# Patient Record
Sex: Female | Born: 1935
Health system: Southern US, Community
[De-identification: ages and names within clinical notes are randomized; demographics above are authoritative.]

## PROBLEM LIST (undated history)

## (undated) DIAGNOSIS — M25559 Pain in unspecified hip: Secondary | ICD-10-CM

## (undated) DIAGNOSIS — R7302 Impaired glucose tolerance (oral): Secondary | ICD-10-CM

## (undated) DIAGNOSIS — D492 Neoplasm of unspecified behavior of bone, soft tissue, and skin: Secondary | ICD-10-CM

## (undated) DIAGNOSIS — M810 Age-related osteoporosis without current pathological fracture: Secondary | ICD-10-CM

## (undated) DIAGNOSIS — N6009 Solitary cyst of unspecified breast: Secondary | ICD-10-CM

## (undated) DIAGNOSIS — M1990 Unspecified osteoarthritis, unspecified site: Secondary | ICD-10-CM

## (undated) DIAGNOSIS — I10 Essential (primary) hypertension: Secondary | ICD-10-CM

## (undated) DIAGNOSIS — I499 Cardiac arrhythmia, unspecified: Secondary | ICD-10-CM

## (undated) DIAGNOSIS — I2699 Other pulmonary embolism without acute cor pulmonale: Secondary | ICD-10-CM

## (undated) DIAGNOSIS — E785 Hyperlipidemia, unspecified: Secondary | ICD-10-CM

## (undated) DIAGNOSIS — R600 Localized edema: Secondary | ICD-10-CM

## (undated) DIAGNOSIS — Z9189 Other specified personal risk factors, not elsewhere classified: Secondary | ICD-10-CM

## (undated) HISTORY — DX: Neoplasm of unspecified behavior of bone, soft tissue, and skin: D49.2

## (undated) HISTORY — PX: ABDOMINAL HYSTERECTOMY: SHX81

## (undated) HISTORY — DX: Pain in unspecified hip: M25.559

## (undated) HISTORY — DX: Solitary cyst of unspecified breast: N60.09

## (undated) HISTORY — DX: Hyperlipidemia, unspecified: E78.5

## (undated) HISTORY — DX: Essential (primary) hypertension: I10

## (undated) HISTORY — PX: APPENDECTOMY: SHX54

## (undated) HISTORY — DX: Age-related osteoporosis without current pathological fracture: M81.0

## (undated) HISTORY — DX: Impaired glucose tolerance (oral): R73.02

## (undated) HISTORY — DX: Other pulmonary embolism without acute cor pulmonale: I26.99

## (undated) HISTORY — DX: Other specified personal risk factors, not elsewhere classified: Z91.89

## (undated) HISTORY — PX: BREAST SURGERY: SHX581

---

## 1978-01-26 HISTORY — PX: CHOLECYSTECTOMY: SHX55

## 1998-03-14 ENCOUNTER — Encounter: Payer: Self-pay | Admitting: Emergency Medicine

## 1998-03-14 ENCOUNTER — Emergency Department (HOSPITAL_COMMUNITY): Admission: EM | Admit: 1998-03-14 | Discharge: 1998-03-14 | Payer: Self-pay | Admitting: Emergency Medicine

## 1999-10-24 ENCOUNTER — Encounter: Payer: Self-pay | Admitting: Internal Medicine

## 1999-10-24 ENCOUNTER — Encounter: Admission: RE | Admit: 1999-10-24 | Discharge: 1999-10-24 | Payer: Self-pay | Admitting: Internal Medicine

## 2000-11-01 ENCOUNTER — Encounter: Admission: RE | Admit: 2000-11-01 | Discharge: 2000-11-01 | Payer: Self-pay | Admitting: Internal Medicine

## 2000-11-01 ENCOUNTER — Encounter: Payer: Self-pay | Admitting: Internal Medicine

## 2000-11-04 ENCOUNTER — Encounter: Admission: RE | Admit: 2000-11-04 | Discharge: 2000-11-04 | Payer: Self-pay | Admitting: Internal Medicine

## 2000-11-04 ENCOUNTER — Encounter: Payer: Self-pay | Admitting: Internal Medicine

## 2001-11-08 ENCOUNTER — Encounter: Admission: RE | Admit: 2001-11-08 | Discharge: 2001-11-08 | Payer: Self-pay | Admitting: Internal Medicine

## 2001-11-08 ENCOUNTER — Encounter: Payer: Self-pay | Admitting: Internal Medicine

## 2002-11-17 ENCOUNTER — Encounter: Admission: RE | Admit: 2002-11-17 | Discharge: 2002-11-17 | Payer: Self-pay | Admitting: Internal Medicine

## 2002-11-17 ENCOUNTER — Encounter: Payer: Self-pay | Admitting: Internal Medicine

## 2003-12-03 ENCOUNTER — Encounter: Admission: RE | Admit: 2003-12-03 | Discharge: 2003-12-03 | Payer: Self-pay | Admitting: Internal Medicine

## 2004-01-13 ENCOUNTER — Encounter: Payer: Self-pay | Admitting: Internal Medicine

## 2004-02-05 ENCOUNTER — Ambulatory Visit: Payer: Self-pay | Admitting: Internal Medicine

## 2004-04-21 ENCOUNTER — Ambulatory Visit: Payer: Self-pay | Admitting: Internal Medicine

## 2004-07-09 ENCOUNTER — Ambulatory Visit: Payer: Self-pay | Admitting: Internal Medicine

## 2004-08-04 ENCOUNTER — Ambulatory Visit: Payer: Self-pay | Admitting: Internal Medicine

## 2004-08-29 ENCOUNTER — Ambulatory Visit: Payer: Self-pay | Admitting: Family Medicine

## 2004-11-04 ENCOUNTER — Ambulatory Visit: Payer: Self-pay | Admitting: Internal Medicine

## 2004-11-14 ENCOUNTER — Ambulatory Visit: Payer: Self-pay

## 2004-12-22 ENCOUNTER — Encounter: Admission: RE | Admit: 2004-12-22 | Discharge: 2004-12-22 | Payer: Self-pay | Admitting: Internal Medicine

## 2005-02-04 ENCOUNTER — Ambulatory Visit: Payer: Self-pay | Admitting: Internal Medicine

## 2005-05-22 ENCOUNTER — Ambulatory Visit: Payer: Self-pay | Admitting: Family Medicine

## 2005-08-04 ENCOUNTER — Ambulatory Visit: Payer: Self-pay | Admitting: Internal Medicine

## 2005-12-02 ENCOUNTER — Ambulatory Visit: Payer: Self-pay | Admitting: Internal Medicine

## 2005-12-29 ENCOUNTER — Encounter: Admission: RE | Admit: 2005-12-29 | Discharge: 2005-12-29 | Payer: Self-pay | Admitting: Internal Medicine

## 2006-06-02 ENCOUNTER — Ambulatory Visit: Payer: Self-pay | Admitting: Internal Medicine

## 2006-06-10 ENCOUNTER — Encounter: Payer: Self-pay | Admitting: Internal Medicine

## 2006-06-10 DIAGNOSIS — Z9189 Other specified personal risk factors, not elsewhere classified: Secondary | ICD-10-CM | POA: Insufficient documentation

## 2006-06-10 DIAGNOSIS — I10 Essential (primary) hypertension: Secondary | ICD-10-CM

## 2006-06-10 DIAGNOSIS — M810 Age-related osteoporosis without current pathological fracture: Secondary | ICD-10-CM | POA: Insufficient documentation

## 2006-06-10 DIAGNOSIS — E785 Hyperlipidemia, unspecified: Secondary | ICD-10-CM

## 2006-06-10 HISTORY — DX: Essential (primary) hypertension: I10

## 2006-06-10 HISTORY — DX: Age-related osteoporosis without current pathological fracture: M81.0

## 2006-06-10 HISTORY — DX: Other specified personal risk factors, not elsewhere classified: Z91.89

## 2006-06-10 HISTORY — DX: Hyperlipidemia, unspecified: E78.5

## 2006-08-20 ENCOUNTER — Telehealth: Payer: Self-pay | Admitting: Internal Medicine

## 2006-09-06 ENCOUNTER — Ambulatory Visit: Payer: Self-pay | Admitting: Internal Medicine

## 2006-09-06 DIAGNOSIS — R300 Dysuria: Secondary | ICD-10-CM

## 2006-09-06 LAB — CONVERTED CEMR LAB
Bilirubin Urine: NEGATIVE
Glucose, Urine, Semiquant: NEGATIVE
Ketones, urine, test strip: NEGATIVE
Nitrite: POSITIVE
Protein, U semiquant: 30
Specific Gravity, Urine: 1.025
pH: 5

## 2006-09-09 ENCOUNTER — Telehealth: Payer: Self-pay | Admitting: Internal Medicine

## 2007-01-04 ENCOUNTER — Encounter: Admission: RE | Admit: 2007-01-04 | Discharge: 2007-01-04 | Payer: Self-pay | Admitting: Internal Medicine

## 2007-01-19 ENCOUNTER — Ambulatory Visit: Payer: Self-pay | Admitting: Internal Medicine

## 2007-01-19 LAB — CONVERTED CEMR LAB
ALT: 17 units/L (ref 0–35)
AST: 27 units/L (ref 0–37)
Albumin: 3.9 g/dL (ref 3.5–5.2)
Alkaline Phosphatase: 43 units/L (ref 39–117)
BUN: 18 mg/dL (ref 6–23)
Basophils Absolute: 0 10*3/uL (ref 0.0–0.1)
Basophils Relative: 0.1 % (ref 0.0–1.0)
Bilirubin, Direct: 0.3 mg/dL (ref 0.0–0.3)
CO2: 30 meq/L (ref 19–32)
Calcium: 9.9 mg/dL (ref 8.4–10.5)
Chloride: 106 meq/L (ref 96–112)
Cholesterol: 146 mg/dL (ref 0–200)
Creatinine, Ser: 1 mg/dL (ref 0.4–1.2)
Eosinophils Absolute: 0.2 10*3/uL (ref 0.0–0.6)
Eosinophils Relative: 1.9 % (ref 0.0–5.0)
GFR calc Af Amer: 70 mL/min
GFR calc non Af Amer: 58 mL/min
Glucose, Bld: 109 mg/dL — ABNORMAL HIGH (ref 70–99)
HCT: 37.8 % (ref 36.0–46.0)
HDL: 63.7 mg/dL (ref >39.0)
Hemoglobin: 13.1 g/dL (ref 12.0–15.0)
LDL Cholesterol: 68 mg/dL (ref 0–99)
Lymphocytes Relative: 28.3 % (ref 12.0–46.0)
MCHC: 34.6 g/dL (ref 30.0–36.0)
MCV: 91.8 fL (ref 78.0–100.0)
Monocytes Absolute: 0.7 10*3/uL (ref 0.2–0.7)
Monocytes Relative: 8.2 % (ref 3.0–11.0)
Neutro Abs: 5.3 10*3/uL (ref 1.4–7.7)
Neutrophils Relative %: 61.5 % (ref 43.0–77.0)
Platelets: 295 10*3/uL (ref 150–400)
Potassium: 4.6 meq/L (ref 3.5–5.1)
RBC: 4.12 M/uL (ref 3.87–5.11)
RDW: 13.5 % (ref 11.5–14.6)
Sodium: 144 meq/L (ref 135–145)
TSH: 2.9 microintl units/mL (ref 0.35–5.50)
Total Bilirubin: 1 mg/dL (ref 0.3–1.2)
Total CHOL/HDL Ratio: 2.3
Total Protein: 6.3 g/dL (ref 6.0–8.3)
Triglycerides: 72 mg/dL (ref 0–149)
VLDL: 14 mg/dL (ref 0–40)
WBC: 8.6 10*3/uL (ref 4.5–10.5)

## 2007-01-31 ENCOUNTER — Ambulatory Visit: Payer: Self-pay | Admitting: Internal Medicine

## 2007-01-31 DIAGNOSIS — M25559 Pain in unspecified hip: Secondary | ICD-10-CM

## 2007-01-31 HISTORY — DX: Pain in unspecified hip: M25.559

## 2007-05-27 ENCOUNTER — Ambulatory Visit: Payer: Self-pay | Admitting: Internal Medicine

## 2007-05-27 DIAGNOSIS — N390 Urinary tract infection, site not specified: Secondary | ICD-10-CM

## 2007-05-27 LAB — CONVERTED CEMR LAB
Bilirubin Urine: NEGATIVE
Glucose, Urine, Semiquant: NEGATIVE
Ketones, urine, test strip: NEGATIVE
Nitrite: POSITIVE
Protein, U semiquant: 30
Specific Gravity, Urine: >=1.03
pH: 5

## 2007-05-30 ENCOUNTER — Telehealth: Payer: Self-pay | Admitting: Internal Medicine

## 2007-09-07 ENCOUNTER — Ambulatory Visit: Payer: Self-pay | Admitting: Family Medicine

## 2007-09-07 LAB — CONVERTED CEMR LAB
Bilirubin Urine: NEGATIVE
Glucose, Urine, Semiquant: NEGATIVE
Ketones, urine, test strip: NEGATIVE
Nitrite: NEGATIVE
Protein, U semiquant: 30
Specific Gravity, Urine: 1.015
Urobilinogen, UA: 0.2
pH: 8.5

## 2007-09-27 ENCOUNTER — Ambulatory Visit: Payer: Self-pay | Admitting: Internal Medicine

## 2008-01-06 ENCOUNTER — Encounter: Admission: RE | Admit: 2008-01-06 | Discharge: 2008-01-06 | Payer: Self-pay | Admitting: Internal Medicine

## 2008-01-23 ENCOUNTER — Encounter: Admission: RE | Admit: 2008-01-23 | Discharge: 2008-01-23 | Payer: Self-pay | Admitting: Internal Medicine

## 2008-01-30 ENCOUNTER — Ambulatory Visit: Payer: Self-pay | Admitting: Internal Medicine

## 2008-05-31 ENCOUNTER — Ambulatory Visit: Payer: Self-pay | Admitting: Internal Medicine

## 2008-05-31 DIAGNOSIS — D492 Neoplasm of unspecified behavior of bone, soft tissue, and skin: Secondary | ICD-10-CM | POA: Insufficient documentation

## 2008-05-31 HISTORY — DX: Neoplasm of unspecified behavior of bone, soft tissue, and skin: D49.2

## 2008-05-31 LAB — CONVERTED CEMR LAB
ALT: 13 units/L (ref 0–35)
AST: 23 units/L (ref 0–37)
Albumin: 3.9 g/dL (ref 3.5–5.2)
Alkaline Phosphatase: 44 units/L (ref 39–117)
BUN: 26 mg/dL — ABNORMAL HIGH (ref 6–23)
Basophils Absolute: 0 10*3/uL (ref 0.0–0.1)
Basophils Relative: 0.4 % (ref 0.0–3.0)
Bilirubin, Direct: 0.2 mg/dL (ref 0.0–0.3)
CO2: 30 meq/L (ref 19–32)
Calcium: 9.8 mg/dL (ref 8.4–10.5)
Chloride: 109 meq/L (ref 96–112)
Cholesterol: 134 mg/dL (ref 0–200)
Creatinine, Ser: 0.9 mg/dL (ref 0.4–1.2)
Eosinophils Absolute: 0.1 10*3/uL (ref 0.0–0.7)
Eosinophils Relative: 1.5 % (ref 0.0–5.0)
GFR calc non Af Amer: 64.91 mL/min (ref >60)
Glucose, Bld: 115 mg/dL — ABNORMAL HIGH (ref 70–99)
HCT: 36.7 % (ref 36.0–46.0)
HDL: 60.6 mg/dL (ref >39.00)
Hemoglobin: 12.5 g/dL (ref 12.0–15.0)
LDL Cholesterol: 59 mg/dL (ref 0–99)
Lymphocytes Relative: 30.2 % (ref 12.0–46.0)
Lymphs Abs: 2.1 10*3/uL (ref 0.7–4.0)
MCHC: 33.9 g/dL (ref 30.0–36.0)
MCV: 92.6 fL (ref 78.0–100.0)
Monocytes Absolute: 0.5 10*3/uL (ref 0.1–1.0)
Monocytes Relative: 7.1 % (ref 3.0–12.0)
Neutro Abs: 4.3 10*3/uL (ref 1.4–7.7)
Neutrophils Relative %: 60.8 % (ref 43.0–77.0)
Platelets: 262 10*3/uL (ref 150.0–400.0)
Potassium: 4.3 meq/L (ref 3.5–5.1)
RBC: 3.96 M/uL (ref 3.87–5.11)
RDW: 13.4 % (ref 11.5–14.6)
Sodium: 145 meq/L (ref 135–145)
TSH: 2.53 microintl units/mL (ref 0.35–5.50)
Total Bilirubin: 0.7 mg/dL (ref 0.3–1.2)
Total CHOL/HDL Ratio: 2
Total Protein: 6.8 g/dL (ref 6.0–8.3)
Triglycerides: 72 mg/dL (ref 0.0–149.0)
VLDL: 14.4 mg/dL (ref 0.0–40.0)
WBC: 7 10*3/uL (ref 4.5–10.5)

## 2008-10-02 ENCOUNTER — Ambulatory Visit: Payer: Self-pay | Admitting: Internal Medicine

## 2009-02-04 ENCOUNTER — Ambulatory Visit: Payer: Self-pay | Admitting: Internal Medicine

## 2009-02-27 ENCOUNTER — Encounter: Admission: RE | Admit: 2009-02-27 | Discharge: 2009-02-27 | Payer: Self-pay | Admitting: Internal Medicine

## 2009-02-27 LAB — HM MAMMOGRAPHY: HM Mammogram: NEGATIVE

## 2009-06-10 ENCOUNTER — Ambulatory Visit: Payer: Self-pay | Admitting: Internal Medicine

## 2009-06-10 LAB — CONVERTED CEMR LAB
ALT: 16 units/L (ref 0–35)
AST: 28 units/L (ref 0–37)
Albumin: 4.2 g/dL (ref 3.5–5.2)
Alkaline Phosphatase: 52 units/L (ref 39–117)
BUN: 29 mg/dL — ABNORMAL HIGH (ref 6–23)
Basophils Absolute: 0 10*3/uL (ref 0.0–0.1)
Basophils Relative: 0.2 % (ref 0.0–3.0)
Bilirubin, Direct: 0.2 mg/dL (ref 0.0–0.3)
CO2: 30 meq/L (ref 19–32)
Calcium: 9.7 mg/dL (ref 8.4–10.5)
Chloride: 105 meq/L (ref 96–112)
Cholesterol: 128 mg/dL (ref 0–200)
Creatinine, Ser: 0.9 mg/dL (ref 0.4–1.2)
Eosinophils Absolute: 0.1 10*3/uL (ref 0.0–0.7)
Eosinophils Relative: 2.1 % (ref 0.0–5.0)
GFR calc non Af Amer: 63.91 mL/min (ref >60)
Glucose, Bld: 111 mg/dL — ABNORMAL HIGH (ref 70–99)
HCT: 37.7 % (ref 36.0–46.0)
HDL: 63 mg/dL (ref >39.00)
Hemoglobin: 12.9 g/dL (ref 12.0–15.0)
LDL Cholesterol: 49 mg/dL (ref 0–99)
Lymphocytes Relative: 32 % (ref 12.0–46.0)
Lymphs Abs: 2 10*3/uL (ref 0.7–4.0)
MCHC: 34.3 g/dL (ref 30.0–36.0)
MCV: 92.7 fL (ref 78.0–100.0)
Monocytes Absolute: 0.5 10*3/uL (ref 0.1–1.0)
Monocytes Relative: 7.8 % (ref 3.0–12.0)
Neutro Abs: 3.6 10*3/uL (ref 1.4–7.7)
Neutrophils Relative %: 57.9 % (ref 43.0–77.0)
Platelets: 267 10*3/uL (ref 150.0–400.0)
Potassium: 4.5 meq/L (ref 3.5–5.1)
RBC: 4.06 M/uL (ref 3.87–5.11)
RDW: 14.9 % — ABNORMAL HIGH (ref 11.5–14.6)
Sodium: 145 meq/L (ref 135–145)
TSH: 3.67 microintl units/mL (ref 0.35–5.50)
Total Bilirubin: 0.8 mg/dL (ref 0.3–1.2)
Total CHOL/HDL Ratio: 2
Total Protein: 7 g/dL (ref 6.0–8.3)
Triglycerides: 81 mg/dL (ref 0.0–149.0)
VLDL: 16.2 mg/dL (ref 0.0–40.0)
WBC: 6.3 10*3/uL (ref 4.5–10.5)

## 2009-10-09 ENCOUNTER — Encounter: Payer: Self-pay | Admitting: Internal Medicine

## 2009-10-10 ENCOUNTER — Encounter: Payer: Self-pay | Admitting: Internal Medicine

## 2009-10-22 ENCOUNTER — Telehealth: Payer: Self-pay | Admitting: Internal Medicine

## 2009-12-11 ENCOUNTER — Ambulatory Visit: Payer: Self-pay | Admitting: Internal Medicine

## 2009-12-11 ENCOUNTER — Encounter: Payer: Self-pay | Admitting: Internal Medicine

## 2010-02-25 NOTE — Progress Notes (Signed)
Summary: mail order meds    Phone Note Refill Request   Refills Requested: Medication #1:  HYDROCHLOROTHIAZIDE 25 MG TABS Take 1 tablet by mouth once a day  Medication #2:  ZOCOR 40 MG TABS Take 1 tablet by mouth once a day  Medication #3:  ALPRAZOLAM 0.5 MG  TABS 1 two times a day as needed.  Medication #4:  FOSAMAX 70 MG TABS Take 1 tablet by mouth once a week right source mail order    Method Requested: Fax to Local Pharmacy Initial call taken by: Duard Brady LPN,  October 22, 2009 11:26 AM    Prescriptions: ALPRAZOLAM 0.5 MG  TABS (ALPRAZOLAM) 1 two times a day as needed  #50 x 3   Entered by:   Duard Brady LPN   Authorized by:   Gordy Savers  MD   Signed by:   Duard Brady LPN on 44/03/4740   Method used:   Historical   RxID:   [id number] ZOCOR 40 MG TABS (SIMVASTATIN) Take 1 tablet by mouth once a day  #90 x 3   Entered by:   Duard Brady LPN   Authorized by:   Gordy Savers  MD   Signed by:   Duard Brady LPN on 88/41/6606   Method used:   Historical   RxID:   [id number] HYDROCHLOROTHIAZIDE 25 MG TABS (HYDROCHLOROTHIAZIDE) Take 1 tablet by mouth once a day  #90 x 3   Entered by:   Duard Brady LPN   Authorized by:   Gordy Savers  MD   Signed by:   Duard Brady LPN on 20/25/4270   Method used:   Historical   RxID:   [id number] FOSAMAX 70 MG TABS (ALENDRONATE SODIUM) Take 1 tablet by mouth once a week  #12 x 6   Entered by:   Duard Brady LPN   Authorized by:   Gordy Savers  MD   Signed by:   Duard Brady LPN on 73/71/0626   Method used:   Historical   RxID:   [id number]

## 2010-02-25 NOTE — Miscellaneous (Signed)
Summary: flu vaccine  Clinical Lists Changes  Observations: Added new observation of FLU VAX: Historical (10/09/2009 9:58)      Immunization History:  Influenza Immunization History:    Influenza:  Historical (10/09/2009) [facility] [address]

## 2010-02-25 NOTE — Assessment & Plan Note (Signed)
Summary: 4 month rov/njr/pt rsc/cjr   Vital Signs:  Patient profile:   77 year old female Weight:      180 pounds BMI:     28.29 BP sitting:   140 / 80  (left arm) Cuff size:   regular  Vitals Entered By: Raechel Ache, RN (February 04, 2009 8:12 AM) CC: 4 mo ROV   CC:  4 mo ROV.  History of Present Illness: 77 year-old patient is seen today for follow-up.  She has a history of treated hypertension, as well as dyslipidemia.  She is on statin therapy, which s he tolerates well.  She has osteoporosis.  no cardiopulmonary complaints.  Laboratory studies were done in the spring of last year.  Allergies: No Known Drug Allergies  Past History:  Past Medical History: Reviewed history from 06/10/2006 and no changes required. Hyperlipidemia Hypertension Osteoporosis  Past Surgical History: Appendectomy Cholecystectomy Hysterectomy gravida 4, para 4, abortus zero breast biopsies x 2 in 1985, 1998  colonoscopy- none (declines)  Review of Systems  The patient denies anorexia, fever, weight loss, weight gain, vision loss, decreased hearing, hoarseness, chest pain, syncope, dyspnea on exertion, peripheral edema, prolonged cough, headaches, hemoptysis, abdominal pain, melena, hematochezia, severe indigestion/heartburn, hematuria, incontinence, genital sores, muscle weakness, suspicious skin lesions, transient blindness, difficulty walking, depression, unusual weight change, abnormal bleeding, enlarged lymph nodes, angioedema, and breast masses.    Physical Exam  General:  Well-developed,well-nourished,in no acute distress; alert,appropriate and cooperative throughout examination;  122/76 Head:  Normocephalic and atraumatic without obvious abnormalities. No apparent alopecia or balding. Eyes:  No corneal or conjunctival inflammation noted. EOMI. Perrla. Funduscopic exam benign, without hemorrhages, exudates or papilledema. Vision grossly normal. Mouth:  Oral mucosa and oropharynx  without lesions or exudates.  edentulous Neck:  No deformities, masses, or tenderness noted. Lungs:  Normal respiratory effort, chest expands symmetrically. Lungs are clear to auscultation, no crackles or wheezes. Heart:  Normal rate and regular rhythm. S1 and S2 normal without gallop, murmur, click, rub or other extra sounds. Abdomen:  Bowel sounds positive,abdomen soft and non-tender without masses, organomegaly or hernias noted. Msk:  No deformity or scoliosis noted of thoracic or lumbar spine.   Pulses:  R and L carotid,radial,femoral,dorsalis pedis and posterior tibial pulses are full and equal bilaterally Extremities:  No clubbing, cyanosis, edema, or deformity noted with normal full range of motion of all joints.     Impression & Recommendations:  Problem # 1:  OSTEOPOROSIS (ICD-733.00)  Her updated medication list for this problem includes:    Fosamax 70 Mg Tabs (Alendronate sodium) .Marland Kitchen... Take 1 tablet by mouth once a week  Her updated medication list for this problem includes:    Fosamax 70 Mg Tabs (Alendronate sodium) .Marland Kitchen... Take 1 tablet by mouth once a week  Problem # 2:  HYPERTENSION (ICD-401.9)  Her updated medication list for this problem includes:    Hydrochlorothiazide 25 Mg Tabs (Hydrochlorothiazide) .Marland Kitchen... Take 1 tablet by mouth once a day  Her updated medication list for this problem includes:    Hydrochlorothiazide 25 Mg Tabs (Hydrochlorothiazide) .Marland Kitchen... Take 1 tablet by mouth once a day  Problem # 3:  HYPERLIPIDEMIA (ICD-272.4)  Her updated medication list for this problem includes:    Zocor 40 Mg Tabs (Simvastatin) .Marland Kitchen... Take 1 tablet by mouth once a day  Her updated medication list for this problem includes:    Zocor 40 Mg Tabs (Simvastatin) .Marland Kitchen... Take 1 tablet by mouth once a day  Complete Medication List:  1)  Fosamax 70 Mg Tabs (Alendronate sodium) .... Take 1 tablet by mouth once a week 2)  Hydrochlorothiazide 25 Mg Tabs (Hydrochlorothiazide) .... Take 1  tablet by mouth once a day 3)  Zocor 40 Mg Tabs (Simvastatin) .... Take 1 tablet by mouth once a day 4)  Alprazolam 0.5 Mg Tabs (Alprazolam) .Marland Kitchen.. 1 two times a day as needed  Patient Instructions: 1)  Please schedule a follow-up appointment in 4 months for CPX 2)  Limit your Sodium (Salt) to less than 2 grams a day(slightly less than 1/2 a teaspoon) to prevent fluid retention, swelling, or worsening of symptoms. 3)  It is important that you exercise regularly at least 20 minutes 5 times a week. If you develop chest pain, have severe difficulty breathing, or feel very tired , stop exercising immediately and seek medical attention. Prescriptions: ALPRAZOLAM 0.5 MG  TABS (ALPRAZOLAM) 1 two times a day as needed  #50 x 4   Entered and Authorized by:   Gordy Savers  MD   Signed by:   Gordy Savers  MD on 02/04/2009   Method used:   Print then Give to Patient   RxID:   [id number] ZOCOR 40 MG TABS (SIMVASTATIN) Take 1 tablet by mouth once a day  #90 x 6   Entered and Authorized by:   Gordy Savers  MD   Signed by:   Gordy Savers  MD on 02/04/2009   Method used:   Print then Give to Patient   RxID:   [id number] HYDROCHLOROTHIAZIDE 25 MG TABS (HYDROCHLOROTHIAZIDE) Take 1 tablet by mouth once a day  #90 x 6   Entered and Authorized by:   Gordy Savers  MD   Signed by:   Gordy Savers  MD on 02/04/2009   Method used:   Print then Give to Patient   RxID:   [id number] FOSAMAX 70 MG TABS (ALENDRONATE SODIUM) Take 1 tablet by mouth once a week  #12 x 6   Entered and Authorized by:   Gordy Savers  MD   Signed by:   Gordy Savers  MD on 02/04/2009   Method used:   Print then Give to Patient   RxID:   [id number]

## 2010-02-25 NOTE — Miscellaneous (Signed)
Summary: Flu Shot/[facility]  Flu Shot/[facility]   Imported By: Maryln Gottron 10/15/2009 10:29:35  _____________________________________________________________________  External Attachment:    Type:   Image     Comment:   External Document

## 2010-02-25 NOTE — Assessment & Plan Note (Signed)
Summary: rov   Vital Signs:  Patient profile:   77 year old female Weight:      180 pounds Temp:     98.2 degrees F oral BP sitting:   130 / 80  (right arm) Cuff size:   regular  Vitals Entered By: Duard Brady LPN (December 11, 2009 8:12 AM) CC: rov - doing well Is Patient Diabetic? No   CC:  rov - doing well.  History of Present Illness: 77 year old patient who is seen today for follow-up.  She has a history of mild hypertension, dyslipidemia, and osteoporosis.  She is doing quite well.  She did have a physical 6 months ago with lab that was unremarkable.  Blood pressure remains nicely controlled. She remains asymptomatic.  Remains on simvastatin 40 mg daily, which  she continues to tolerate well.  she denies any cardiopulmonary complaints.  Denies any muscle pain.  Laboratory studies  suggests impaired glucose tolerance with a fasting blood sugar of 111  Allergies (verified): No Known Drug Allergies  Past History:  Past Medical History: Hyperlipidemia Hypertension Osteoporosis impaired glucose tolerance  Review of Systems  The patient denies anorexia, fever, weight loss, weight gain, vision loss, decreased hearing, hoarseness, chest pain, syncope, dyspnea on exertion, peripheral edema, prolonged cough, headaches, hemoptysis, abdominal pain, melena, hematochezia, severe indigestion/heartburn, hematuria, incontinence, genital sores, muscle weakness, suspicious skin lesions, transient blindness, difficulty walking, depression, unusual weight change, abnormal bleeding, enlarged lymph nodes, angioedema, and breast masses.    Physical Exam  General:  Well-developed,well-nourished,in no acute distress; alert,appropriate and cooperative throughout examination Head:  Normocephalic and atraumatic without obvious abnormalities. No apparent alopecia or balding. Eyes:  No corneal or conjunctival inflammation noted. EOMI. Perrla. Funduscopic exam benign, without hemorrhages,  exudates or papilledema. Vision grossly normal. Mouth:  Oral mucosa and oropharynx without lesions or exudates.  Teeth in good repair. Neck:  No deformities, masses, or tenderness noted. Lungs:  Normal respiratory effort, chest expands symmetrically. Lungs are clear to auscultation, no crackles or wheezes. Heart:  Normal rate and regular rhythm. S1 and S2 normal without gallop, murmur, click, rub or other extra sounds. Abdomen:  Bowel sounds positive,abdomen soft and non-tender without masses, organomegaly or hernias noted. Msk:  No deformity or scoliosis noted of thoracic or lumbar spine.   Pulses:  R and L carotid,radial,femoral,dorsalis pedis and posterior tibial pulses are full and equal bilaterally Extremities:  No clubbing, cyanosis, edema, or deformity noted with normal full range of motion of all joints.   Skin:  Intact without suspicious lesions or rashes Cervical Nodes:  No lymphadenopathy noted   Impression & Recommendations:  Problem # 1:  OSTEOPOROSIS (ICD-733.00)  Her updated medication list for this problem includes:    Fosamax 70 Mg Tabs (Alendronate sodium) .Marland Kitchen... Take 1 tablet by mouth once a week  Problem # 2:  HYPERTENSION (ICD-401.9)  Her updated medication list for this problem includes:    Hydrochlorothiazide 25 Mg Tabs (Hydrochlorothiazide) .Marland Kitchen... Take 1 tablet by mouth once a day  Problem # 3:  HYPERLIPIDEMIA (ICD-272.4)  Her updated medication list for this problem includes:    Zocor 40 Mg Tabs (Simvastatin) .Marland Kitchen... Take 1 tablet by mouth once a day  Complete Medication List: 1)  Fosamax 70 Mg Tabs (Alendronate sodium) .... Take 1 tablet by mouth once a week 2)  Hydrochlorothiazide 25 Mg Tabs (Hydrochlorothiazide) .... Take 1 tablet by mouth once a day 3)  Zocor 40 Mg Tabs (Simvastatin) .... Take 1 tablet by mouth once a day  4)  Alprazolam 0.5 Mg Tabs (Alprazolam) .Marland Kitchen.. 1 two times a day as needed  Patient Instructions: 1)  Please schedule a follow-up  appointment in 6 months. 2)  Advised not to eat any food or drink any liquids after 10 PM the night before your procedure. 3)  Limit your Sodium (Salt). 4)  It is important that you exercise regularly at least 20 minutes 5 times a week. If you develop chest pain, have severe difficulty breathing, or feel very tired , stop exercising immediately and seek medical attention. 5)  You need to lose weight. Consider a lower calorie diet and regular exercise.  6)  Take calcium +Vitamin D daily. Prescriptions: ALPRAZOLAM 0.5 MG  TABS (ALPRAZOLAM) 1 two times a day as needed  #50 x 3   Entered and Authorized by:   Gordy Savers  MD   Signed by:   Gordy Savers  MD on 12/11/2009   Method used:   Print then Give to Patient   RxID:   [id number] ZOCOR 40 MG TABS (SIMVASTATIN) Take 1 tablet by mouth once a day  #90 x 3   Entered and Authorized by:   Gordy Savers  MD   Signed by:   Gordy Savers  MD on 12/11/2009   Method used:   Print then Give to Patient   RxID:   [id number] HYDROCHLOROTHIAZIDE 25 MG TABS (HYDROCHLOROTHIAZIDE) Take 1 tablet by mouth once a day  #90 x 3   Entered and Authorized by:   Gordy Savers  MD   Signed by:   Gordy Savers  MD on 12/11/2009   Method used:   Print then Give to Patient   RxID:   [id number] FOSAMAX 70 MG TABS (ALENDRONATE SODIUM) Take 1 tablet by mouth once a week  #12 x 6   Entered and Authorized by:   Gordy Savers  MD   Signed by:   Gordy Savers  MD on 12/11/2009   Method used:   Print then Give to Patient   RxID:   [id number]    Orders Added: 1)  Est. Patient Level IV [20254]

## 2010-02-25 NOTE — Assessment & Plan Note (Signed)
Summary: cpx/  pt will be fasting/mm   Vital Signs:  Patient profile:   77 year old female Height:      67 inches Weight:      179 pounds BMI:     28.14 Temp:     98.2 degrees F oral BP sitting:   138 / 78  (right arm) Cuff size:   regular  Vitals Entered By: Duard Brady LPN (Jun 10, 2009 8:49 AM) CC: CPX - doing well, Hypertension Management Is Patient Diabetic? No   CC:  CPX - doing well and Hypertension Management.  History of Present Illness: 77 year old patient who is seen today for a comprehensive evaluation.  She has a history of osteoporosis, hypertension, and dyslipidemia.  She has done quite well today and denies any concerns or complaints.  She denies any cardiopulmonary complaints.  She declines pelvic and rectal examination today. Here for Medicare AWV:  1.   Risk factors based on Past M, S, F history:  Norvasc.  Risk factors include age, hypertension, dyslipidemia, and a family history of coronary artery disease 2.   Physical Activities: no restrictions.  She and her husband.  Active with gardening 3.   Depression/mood: no history depression 4.   Hearing: no impairment 5.   ADL's: completely active in all aspects of daily living 6.   Fall Risk: low 7.   Home Safety: no issues identified 8.   Height, weight, &visual acuity: height and weight are stable; visual acuity normal 9.   Counseling:  heart healthy diet and increase fiber discussed, as well as a more rigorous exercise regimen 10.   Labs ordered based on risk factors: complete laboratory profile, including lipid panel will be reviewed 11.           Referral Coordinatio- nonapplicable 12.           Care Plan- heart healthy diet and exercise regimen discussed 13.           Cognitive Assessment- alert and oriented, with normal affect   Hypertension History:      Positive major cardiovascular risk factors include female age 46 years old or older, hyperlipidemia, and hypertension.  Negative major  cardiovascular risk factors include non-tobacco-user status.     Preventive Screening-Counseling & Management  Alcohol-Tobacco     Smoking Status: never  Allergies (verified): No Known Drug Allergies  Past History:  Past Medical History: Reviewed history from 06/10/2006 and no changes required. Hyperlipidemia Hypertension Osteoporosis  Past Surgical History: Reviewed history from 02/04/2009 and no changes required. Appendectomy Cholecystectomy Hysterectomy gravida 4, para 4, abortus zero breast biopsies x 2 in 1985, 1998  colonoscopy- none (declines)  Family History: Reviewed history from 01/19/2007 and no changes required. father died at age 68, history of asthma, CAD   mother died age 41 of CAD One brother died of accidental death  two half sisters, positive CAD one sister is well  Social History: Reviewed history from 01/19/2007 and no changes required. 9 grandchildren and 9 great grandchildren  Review of Systems  The patient denies anorexia, fever, weight loss, weight gain, vision loss, decreased hearing, hoarseness, chest pain, syncope, dyspnea on exertion, peripheral edema, prolonged cough, headaches, hemoptysis, abdominal pain, melena, hematochezia, severe indigestion/heartburn, hematuria, incontinence, genital sores, muscle weakness, suspicious skin lesions, transient blindness, difficulty walking, depression, unusual weight change, abnormal bleeding, enlarged lymph nodes, angioedema, and breast masses.    Physical Exam  General:  Well-developed,well-nourished,in no acute distress; alert,appropriate and cooperative throughout examination Head:  Normocephalic  and atraumatic without obvious abnormalities. No apparent alopecia or balding. Eyes:  No corneal or conjunctival inflammation noted. EOMI. Perrla. Funduscopic exam benign, without hemorrhages, exudates or papilledema. Vision grossly normal. Ears:  External ear exam shows no significant lesions or  deformities.  Otoscopic examination reveals clear canals, tympanic membranes are intact bilaterally without bulging, retraction, inflammation or discharge. Hearing is grossly normal bilaterally. Nose:  External nasal examination shows no deformity or inflammation. Nasal mucosa are pink and moist without lesions or exudates. Mouth:  Oral mucosa and oropharynx without lesions or exudates.  area of thickening, left mandible, which is unchanged Neck:  No deformities, masses, or tenderness noted. Chest Wall:  No deformities, masses, or tenderness noted. Breasts:  No mass, nodules, thickening, tenderness, bulging, retraction, inflamation, nipple discharge or skin changes noted.   Lungs:  Normal respiratory effort, chest expands symmetrically. Lungs are clear to auscultation, no crackles or wheezes. Heart:  Normal rate and regular rhythm. S1 and S2 normal without gallop, murmur, click, rub or other extra sounds. Abdomen:  Bowel sounds positive,abdomen soft and non-tender without masses, organomegaly or hernias noted. Rectal:  declines Genitalia:  declines Msk:  No deformity or scoliosis noted of thoracic or lumbar spine.   Pulses:  dorsalis pedis pulses  full; posterior tibia pulses not easily palpable Extremities:  plus one left ankle edema; feet were is slightly bluish, but well-perfused Neurologic:  No cranial nerve deficits noted. Station and gait are normal. Plantar reflexes are down-going bilaterally. DTRs are symmetrical throughout. Sensory, motor and coordinative functions appear intact. Skin:  Intact without suspicious lesions or rashes Cervical Nodes:  No lymphadenopathy noted Axillary Nodes:  No palpable lymphadenopathy Inguinal Nodes:  No significant adenopathy Psych:  Cognition and judgment appear intact. Alert and cooperative with normal attention span and concentration. No apparent delusions, illusions, hallucinations   Impression & Recommendations:  Problem # 1:  Preventive Health  Care (ICD-V70.0)  Complete Medication List: 1)  Fosamax 70 Mg Tabs (Alendronate sodium) .... Take 1 tablet by mouth once a week 2)  Hydrochlorothiazide 25 Mg Tabs (Hydrochlorothiazide) .... Take 1 tablet by mouth once a day 3)  Zocor 40 Mg Tabs (Simvastatin) .... Take 1 tablet by mouth once a day 4)  Alprazolam 0.5 Mg Tabs (Alprazolam) .Marland Kitchen.. 1 two times a day as needed  Other Orders: EKG w/ Interpretation (93000) First annual wellness visit with prevention plan  (Z6109) Venipuncture (60454) TLB-Lipid Panel (80061-LIPID) TLB-BMP (Basic Metabolic Panel-BMET) (80048-METABOL) TLB-CBC Platelet - w/Differential (85025-CBCD) TLB-Hepatic/Liver Function Pnl (80076-HEPATIC) TLB-TSH (Thyroid Stimulating Hormone) (09811-BJY) Prescription Created Electronically [phone])  Hypertension Assessment/Plan:      The patient's hypertensive risk group is category B: At least one risk factor (excluding diabetes) with no target organ damage.  Her calculated 10 year risk of coronary heart disease is 5 %.  Today's blood pressure is 138/78.    Patient Instructions: 1)  Please schedule a follow-up appointment in 6 months. 2)  Limit your Sodium (Salt). 3)  It is important that you exercise regularly at least 20 minutes 5 times a week. If you develop chest pain, have severe difficulty breathing, or feel very tired , stop exercising immediately and seek medical attention. 4)  HIGH FIBER DIET Prescriptions: ALPRAZOLAM 0.5 MG  TABS (ALPRAZOLAM) 1 two times a day as needed  #50 x 4   Entered and Authorized by:   Gordy Savers  MD   Signed by:   Gordy Savers  MD on 06/10/2009   Method used:  Print then Give to Patient   RxID:   [id number] ZOCOR 40 MG TABS (SIMVASTATIN) Take 1 tablet by mouth once a day  #90 x 6   Entered and Authorized by:   Gordy Savers  MD   Signed by:   Gordy Savers  MD on 06/10/2009   Method used:   Print then Give to Patient   RxID:    [id number] HYDROCHLOROTHIAZIDE 25 MG TABS (HYDROCHLOROTHIAZIDE) Take 1 tablet by mouth once a day  #90 x 6   Entered and Authorized by:   Gordy Savers  MD   Signed by:   Gordy Savers  MD on 06/10/2009   Method used:   Print then Give to Patient   RxID:   [id number] FOSAMAX 70 MG TABS (ALENDRONATE SODIUM) Take 1 tablet by mouth once a week  #12 x 6   Entered and Authorized by:   Gordy Savers  MD   Signed by:   Gordy Savers  MD on 06/10/2009   Method used:   Print then Give to Patient   RxID:   [id number] ZOCOR 40 MG TABS (SIMVASTATIN) Take 1 tablet by mouth once a day  #90 x 6   Entered and Authorized by:   Gordy Savers  MD   Signed by:   Gordy Savers  MD on 06/10/2009   Method used:   Electronically to        [facility] (retail)       [address]       [place], [place]  [address]       Ph: [phone]       Fax: [phone]   RxID:   [id number] HYDROCHLOROTHIAZIDE 25 MG TABS (HYDROCHLOROTHIAZIDE) Take 1 tablet by mouth once a day  #90 x 6   Entered and Authorized by:   Gordy Savers  MD   Signed by:   Gordy Savers  MD on 06/10/2009   Method used:   Electronically to        [facility] (retail)       [address]       [place], [place]  [address]       Ph: [phone]       Fax: [phone]   RxID:   [id number] FOSAMAX 70 MG TABS (ALENDRONATE SODIUM) Take 1 tablet by mouth once a week  #12 x 6   Entered and Authorized by:   Gordy Savers  MD   Signed by:   Gordy Savers  MD on 06/10/2009   Method used:   Electronically to        [facility] (retail)       [address]       [place], [place]  [address]       Ph: [phone]       Fax: [phone]   RxID:   [id number]

## 2010-03-03 ENCOUNTER — Other Ambulatory Visit: Payer: Self-pay | Admitting: Internal Medicine

## 2010-03-03 DIAGNOSIS — Z1231 Encounter for screening mammogram for malignant neoplasm of breast: Secondary | ICD-10-CM

## 2010-03-10 ENCOUNTER — Ambulatory Visit: Payer: Self-pay

## 2010-06-11 ENCOUNTER — Encounter: Payer: Self-pay | Admitting: Internal Medicine

## 2010-06-13 ENCOUNTER — Ambulatory Visit (INDEPENDENT_AMBULATORY_CARE_PROVIDER_SITE_OTHER): Payer: Medicare PPO | Admitting: Internal Medicine

## 2010-06-13 ENCOUNTER — Encounter: Payer: Self-pay | Admitting: Internal Medicine

## 2010-06-13 DIAGNOSIS — I10 Essential (primary) hypertension: Secondary | ICD-10-CM

## 2010-06-13 DIAGNOSIS — M810 Age-related osteoporosis without current pathological fracture: Secondary | ICD-10-CM

## 2010-06-13 DIAGNOSIS — Z23 Encounter for immunization: Secondary | ICD-10-CM

## 2010-06-13 DIAGNOSIS — E785 Hyperlipidemia, unspecified: Secondary | ICD-10-CM

## 2010-06-13 DIAGNOSIS — Z0000 Encounter for general adult medical examination without abnormal findings: Secondary | ICD-10-CM

## 2010-06-13 LAB — CBC WITH DIFFERENTIAL/PLATELET
Basophils Relative: 0.3 % (ref 0.0–3.0)
Eosinophils Absolute: 0.1 10*3/uL (ref 0.0–0.7)
Eosinophils Relative: 2 % (ref 0.0–5.0)
HCT: 38.7 % (ref 36.0–46.0)
Hemoglobin: 13 g/dL (ref 12.0–15.0)
Lymphocytes Relative: 33.3 % (ref 12.0–46.0)
Lymphs Abs: 2.1 10*3/uL (ref 0.7–4.0)
MCHC: 33.7 g/dL (ref 30.0–36.0)
MCV: 93.3 fl (ref 78.0–100.0)
Monocytes Absolute: 0.4 10*3/uL (ref 0.1–1.0)
Monocytes Relative: 7 % (ref 3.0–12.0)
Neutro Abs: 3.6 10*3/uL (ref 1.4–7.7)
Neutrophils Relative %: 57.4 % (ref 43.0–77.0)
RBC: 4.15 Mil/uL (ref 3.87–5.11)
RDW: 14.6 % (ref 11.5–14.6)
WBC: 6.3 10*3/uL (ref 4.5–10.5)

## 2010-06-13 LAB — HEPATIC FUNCTION PANEL
ALT: 10 U/L (ref 0–35)
AST: 23 U/L (ref 0–37)
Albumin: 4 g/dL (ref 3.5–5.2)
Alkaline Phosphatase: 42 U/L (ref 39–117)
Bilirubin, Direct: 0.1 mg/dL (ref 0.0–0.3)
Total Bilirubin: 0.6 mg/dL (ref 0.3–1.2)
Total Protein: 6.7 g/dL (ref 6.0–8.3)

## 2010-06-13 LAB — LIPID PANEL
Cholesterol: 115 mg/dL (ref 0–200)
LDL Cholesterol: 39 mg/dL (ref 0–99)
Total CHOL/HDL Ratio: 2
Triglycerides: 67 mg/dL (ref 0.0–149.0)
VLDL: 13.4 mg/dL (ref 0.0–40.0)

## 2010-06-13 LAB — BASIC METABOLIC PANEL
Calcium: 10.4 mg/dL (ref 8.4–10.5)
Chloride: 105 mEq/L (ref 96–112)
Creatinine, Ser: 1.1 mg/dL (ref 0.4–1.2)
Glucose, Bld: 109 mg/dL — ABNORMAL HIGH (ref 70–99)
Potassium: 5.1 mEq/L (ref 3.5–5.1)

## 2010-06-13 LAB — TSH: TSH: 2.87 u[IU]/mL (ref 0.35–5.50)

## 2010-06-13 MED ORDER — ALENDRONATE SODIUM 70 MG PO TABS: 70.0000 mg | ORAL_TABLET | ORAL | Status: DC

## 2010-06-13 MED ORDER — ALPRAZOLAM 0.5 MG PO TABS: 0.5000 mg | ORAL_TABLET | Freq: Two times a day (BID) | ORAL | Status: DC | PRN

## 2010-06-13 MED ORDER — SIMVASTATIN 40 MG PO TABS: 40.0000 mg | ORAL_TABLET | Freq: Every day | ORAL | Status: DC

## 2010-06-13 MED ORDER — HYDROCHLOROTHIAZIDE 25 MG PO TABS: 25.0000 mg | ORAL_TABLET | Freq: Every day | ORAL | Status: DC

## 2010-06-13 NOTE — Progress Notes (Signed)
Subjective:    Patient ID: Tammy [name], female    DOB: [birth year], 76 y.o.   MRN: [id number]  HPI  77 year old patient who is seen today for a annual exam. She has done quite well without concerns or complaints. She has treated hypertension and dyslipidemia. Denies any cardiopulmonary complaints.  1. Risk factors, based on past  M,S,F history- cardiovascular risk factors include age hypertension and dyslipidemia 2.  Physical activities: Fairly active and independent but no rigorous exercise program  3.  Depression/mood: No history depression or mood disorder  4.  Hearing: No major deficits  5.  ADL's: Remains independent in all aspects of daily living  6.  Fall risk: Moderate due to age  34.  Home safety: No problems identified  8.  Height weight, and visual acuity; height and weight fairly stable. There has been a voluntary 8 pound weight loss no change in visual acuity  9.  Counseling: Heart healthy diet more regular exercise are encouraged 10. Lab orders based on risk factors: Laboratory profile including lipid panel will be reviewed  11. Referral : Not appropriate at this time  12. Care plan: Low-salt heart healthy diet encouraged we'll continue calcium and vitamin D supplements as well as low-dose aspirin  13. Cognitive assessment: Alert and oriented with normal affect. No cognitive dysfunction      Preventive Screening-Counseling & Management  Alcohol-Tobacco  Smoking Status: never  Allergies (verified):  No Known Drug Allergies  Past History:  Past Medical History:  Reviewed history from 06/10/2006 and no changes required.  Hyperlipidemia  Hypertension  Osteoporosis  Past Surgical History:  Reviewed history from 02/04/2009 and no changes required.  Appendectomy  Cholecystectomy  Hysterectomy  gravida 4, para 4, abortus zero  breast biopsies x 2 in 1985, 1998  colonoscopy- none (declines)  Family History:  Reviewed history from 01/19/2007 and no changes  required.  father died at age 32, history of asthma, CAD  mother died age 39 of CAD  One brother died of accidental death  two half sisters, positive CAD  one sister is well  Social History:  Reviewed history from 01/19/2007 and no changes required.  9 grandchildren and 9 great grandchildren     Review of Systems  Constitutional: Negative for fever, appetite change, fatigue and unexpected weight change.  HENT: Negative for hearing loss, ear pain, nosebleeds, congestion, sore throat, mouth sores, trouble swallowing, neck stiffness, dental problem, voice change, sinus pressure and tinnitus.   Eyes: Negative for photophobia, pain, redness and visual disturbance.  Respiratory: Negative for cough, chest tightness and shortness of breath.   Cardiovascular: Negative for chest pain, palpitations and leg swelling.  Gastrointestinal: Negative for nausea, vomiting, abdominal pain, diarrhea, constipation, blood in stool, abdominal distention and rectal pain.  Genitourinary: Negative for dysuria, urgency, frequency, hematuria, flank pain, vaginal bleeding, vaginal discharge, difficulty urinating, genital sores, vaginal pain, menstrual problem and pelvic pain.  Musculoskeletal: Negative for back pain and arthralgias.  Skin: Negative for rash.  Neurological: Negative for dizziness, syncope, speech difficulty, weakness, light-headedness, numbness and headaches.  Hematological: Negative for adenopathy. Does not bruise/bleed easily.  Psychiatric/Behavioral: Negative for suicidal ideas, behavioral problems, self-injury, dysphoric mood and agitation. The patient is not nervous/anxious.        Objective:   Physical Exam  Constitutional: She is oriented to person, place, and time. She appears well-developed and well-nourished.  HENT:  Head: Normocephalic and atraumatic.  Right Ear: External ear normal.  Left Ear: External ear normal.  Mouth/Throat: Oropharynx  is clear and moist.  Eyes: Conjunctivae  and EOM are normal.  Neck: Normal range of motion. Neck supple. No JVD present. No thyromegaly present.  Cardiovascular: Normal rate, regular rhythm, normal heart sounds and intact distal pulses.   No murmur heard.      Dorsalis pedis pulses were full posterior tibial pulses are not easily palpable. Distal feet were cool and slightly cyanotic  Pulmonary/Chest: Effort normal and breath sounds normal. She has no wheezes. She has no rales.  Abdominal: Soft. Bowel sounds are normal. She exhibits no distension and no mass. There is no tenderness. There is no rebound and no guarding.  Genitourinary: Vagina normal. Guaiac negative stool. No vaginal discharge found.       Cystocele  Musculoskeletal: Normal range of motion. She exhibits no edema and no tenderness.  Neurological: She is alert and oriented to person, place, and time. She has normal reflexes. No cranial nerve deficit. She exhibits normal muscle tone. Coordination normal.  Skin: Skin is warm and dry. No rash noted.  Psychiatric: She has a normal mood and affect. Her behavior is normal.          Assessment & Plan:   Unremarkable health maintenance examination Hypertension stable Hypercholesterolemia. We'll check a lipid panel Osteoporosis. Continue Fosamax calcium and vitamin D

## 2010-06-13 NOTE — Progress Notes (Signed)
  Subjective:    Patient ID: Tammy [name], female    DOB: Feb 04, 1931, 77 y.o.   MRN: [id number]  HPI Wt Readings from Last 3 Encounters:  06/13/10 172 lb (78.019 kg)  12/11/09 180 lb (81.647 kg)  06/10/09 179 lb (81.194 kg)     Review of Systems     Objective:   Physical Exam        Assessment & Plan:

## 2010-06-13 NOTE — Patient Instructions (Signed)
Limit your sodium (Salt) intake    It is important that you exercise regularly, at least 20 minutes 3 to 4 times per week.  If you develop chest pain or shortness of breath seek  medical attention.  Take a calcium supplement, plus 800-1200 units of vitamin D  Return in 6 months for follow-up  

## 2010-11-21 ENCOUNTER — Encounter: Payer: Self-pay | Admitting: Internal Medicine

## 2010-11-21 LAB — HM MAMMOGRAPHY: HM Mammogram: NEGATIVE

## 2010-12-15 ENCOUNTER — Encounter: Payer: Self-pay | Admitting: Internal Medicine

## 2010-12-15 ENCOUNTER — Ambulatory Visit (INDEPENDENT_AMBULATORY_CARE_PROVIDER_SITE_OTHER): Payer: Medicare PPO | Admitting: Internal Medicine

## 2010-12-15 DIAGNOSIS — E785 Hyperlipidemia, unspecified: Secondary | ICD-10-CM

## 2010-12-15 DIAGNOSIS — Z23 Encounter for immunization: Secondary | ICD-10-CM

## 2010-12-15 DIAGNOSIS — J111 Influenza due to unidentified influenza virus with other respiratory manifestations: Secondary | ICD-10-CM

## 2010-12-15 DIAGNOSIS — Z0000 Encounter for general adult medical examination without abnormal findings: Secondary | ICD-10-CM

## 2010-12-15 DIAGNOSIS — I10 Essential (primary) hypertension: Secondary | ICD-10-CM

## 2010-12-15 DIAGNOSIS — M810 Age-related osteoporosis without current pathological fracture: Secondary | ICD-10-CM

## 2010-12-15 MED ORDER — HYDROCHLOROTHIAZIDE 25 MG PO TABS: 25.0000 mg | ORAL_TABLET | Freq: Every day | ORAL | Status: DC

## 2010-12-15 MED ORDER — SIMVASTATIN 40 MG PO TABS: 40.0000 mg | ORAL_TABLET | Freq: Every day | ORAL | Status: DC

## 2010-12-15 MED ORDER — ALPRAZOLAM 0.5 MG PO TABS: 0.5000 mg | ORAL_TABLET | Freq: Two times a day (BID) | ORAL | Status: DC | PRN

## 2010-12-15 MED ORDER — ALENDRONATE SODIUM 70 MG PO TABS: 70.0000 mg | ORAL_TABLET | ORAL | Status: DC

## 2010-12-15 MED ORDER — ASPIRIN 81 MG PO CHEW: 81.0000 mg | CHEWABLE_TABLET | Freq: Every day | ORAL | Status: AC

## 2010-12-15 NOTE — Patient Instructions (Signed)
It is important that you exercise regularly, at least 20 minutes 3 to 4 times per week.  If you develop chest pain or shortness of breath seek  medical attention.  Take a calcium supplement, plus [phone] units of vitamin D  Limit your sodium (Salt) intake  Take 81 mg of aspirin daily

## 2010-12-15 NOTE — Progress Notes (Signed)
Subjective:    Patient ID: Tammy [name], female    DOB: 12-20-1931, 77 y.o.   MRN: [id number]  HPI  77 year old patient who is seen today for followup. She has a history of treated hypertension this remains a very well-controlled on hydrochlorothiazide 25 mg daily. She was last seen 6 months ago for her annual exam and laboratory studies were reviewed today. She has dyslipidemia which has been controlled on simvastatin. LDL cholesterol is less than HDL cholesterol. Denies any cardiopulmonary complaints She does have a history of osteoporosis and remains on Fosamax. She also has osteoarthritis but no complaints today. She uses alprazolam sparingly for mild anxiety. She also has a history of osteoarthritis but no concerns or complaints today.  Past Medical History  Diagnosis Date  . BREAST BIOPSY, HX OF 06/10/2006  . HIP PAIN, LEFT 01/31/2007  . HYPERLIPIDEMIA 06/10/2006  . HYPERTENSION 06/10/2006  . NEOPLASMS UNSPEC NATURE BONE SOFT TISSUE&SKIN 05/31/2008  . OSTEOPOROSIS 06/10/2006  . Impaired glucose tolerance     History   Social History  . Marital Status: Married    Spouse Name: N/A    Number of Children: N/A  . Years of Education: N/A   Occupational History  . Not on file.   Social History Main Topics  . Smoking status: Never Smoker   . Smokeless tobacco: Current User    Types: Snuff  . Alcohol Use: No  . Drug Use: No  . Sexually Active: Not on file   Other Topics Concern  . Not on file   Social History Narrative  . No narrative on file    Past Surgical History  Procedure Date  . Appendectomy   . Cholecystectomy   . Abdominal hysterectomy   . Breast surgery     bx x2    Family History  Problem Relation Age of Onset  . Heart disease Mother   . Heart disease Father   . Asthma Father     No Known Allergies  Current Outpatient Prescriptions on File Prior to Visit  Medication Sig Dispense Refill  . alendronate (FOSAMAX) 70 MG tablet Take 1 tablet (70 mg total)  by mouth every 7 (seven) days. Take with a full glass of water on an empty stomach.  12 tablet  6  . ALPRAZolam (XANAX) 0.5 MG tablet Take 1 tablet (0.5 mg total) by mouth 2 (two) times daily as needed.  60 tablet  4  . hydrochlorothiazide 25 MG tablet Take 1 tablet (25 mg total) by mouth daily.  90 tablet  6  . simvastatin (ZOCOR) 40 MG tablet Take 1 tablet (40 mg total) by mouth daily.  90 tablet  6    BP 120/70  Temp(Src) 98.2 F (36.8 C) (Oral)  Wt 173 lb (78.472 kg)       Review of Systems  Constitutional: Negative.   HENT: Negative for hearing loss, congestion, sore throat, rhinorrhea, dental problem, sinus pressure and tinnitus.   Eyes: Negative for pain, discharge and visual disturbance.  Respiratory: Negative for cough and shortness of breath.   Cardiovascular: Negative for chest pain, palpitations and leg swelling.  Gastrointestinal: Negative for nausea, vomiting, abdominal pain, diarrhea, constipation, blood in stool and abdominal distention.  Genitourinary: Negative for dysuria, urgency, frequency, hematuria, flank pain, vaginal bleeding, vaginal discharge, difficulty urinating, vaginal pain and pelvic pain.  Musculoskeletal: Negative for joint swelling, arthralgias and gait problem.  Skin: Negative for rash.  Neurological: Negative for dizziness, syncope, speech difficulty, weakness, numbness and headaches.  Hematological: Negative for adenopathy.  Psychiatric/Behavioral: Negative for behavioral problems, dysphoric mood and agitation. The patient is not nervous/anxious.        Objective:   Physical Exam  Constitutional: She is oriented to person, place, and time. She appears well-developed and well-nourished.  HENT:  Head: Normocephalic.  Right Ear: External ear normal.  Left Ear: External ear normal.  Mouth/Throat: Oropharynx is clear and moist.  Eyes: Conjunctivae and EOM are normal. Pupils are equal, round, and reactive to light.  Neck: Normal range of motion.  Neck supple. No thyromegaly present.  Cardiovascular: Normal rate, regular rhythm, normal heart sounds and intact distal pulses.   Pulmonary/Chest: Effort normal and breath sounds normal.  Abdominal: Soft. Bowel sounds are normal. She exhibits no mass. There is no tenderness.  Musculoskeletal: Normal range of motion.  Lymphadenopathy:    She has no cervical adenopathy.  Neurological: She is alert and oriented to person, place, and time.  Skin: Skin is warm and dry. No rash noted.       Scattered superficial varicosities lower extremities  Psychiatric: She has a normal mood and affect. Her behavior is normal.          Assessment & Plan:  Hypertension. Well controlled on diuretic therapy. We'll continue Dyslipidemia. Well controlled on simvastatin we'll continue the present dose. Osteoarthritis stable Osteoporosis. We'll continue Fosamax.  Exercise calcium and vitamin D recommended Return 6 months for her annual exam All medications refilled

## 2011-06-15 ENCOUNTER — Encounter: Payer: Self-pay | Admitting: Internal Medicine

## 2011-06-15 ENCOUNTER — Ambulatory Visit (INDEPENDENT_AMBULATORY_CARE_PROVIDER_SITE_OTHER): Payer: Medicare Other | Admitting: Internal Medicine

## 2011-06-15 VITALS — BP 110/72 | HR 68 | Temp 98.0°F | Resp 18 | Ht 66.5 in | Wt 169.0 lb

## 2011-06-15 DIAGNOSIS — R350 Frequency of micturition: Secondary | ICD-10-CM

## 2011-06-15 DIAGNOSIS — Z0000 Encounter for general adult medical examination without abnormal findings: Secondary | ICD-10-CM

## 2011-06-15 DIAGNOSIS — I10 Essential (primary) hypertension: Secondary | ICD-10-CM

## 2011-06-15 DIAGNOSIS — E785 Hyperlipidemia, unspecified: Secondary | ICD-10-CM

## 2011-06-15 DIAGNOSIS — M810 Age-related osteoporosis without current pathological fracture: Secondary | ICD-10-CM

## 2011-06-15 LAB — LIPID PANEL
HDL: 66.6 mg/dL (ref >39.00)
LDL Cholesterol: 37 mg/dL (ref 0–99)
Total CHOL/HDL Ratio: 2
Triglycerides: 75 mg/dL (ref 0.0–149.0)

## 2011-06-15 LAB — COMPREHENSIVE METABOLIC PANEL
ALT: 13 U/L (ref 0–35)
AST: 27 U/L (ref 0–37)
Chloride: 108 mEq/L (ref 96–112)
Creatinine, Ser: 1.2 mg/dL (ref 0.4–1.2)
Sodium: 145 mEq/L (ref 135–145)
Total Bilirubin: 0.6 mg/dL (ref 0.3–1.2)
Total Protein: 7.1 g/dL (ref 6.0–8.3)

## 2011-06-15 LAB — CBC WITH DIFFERENTIAL/PLATELET
Basophils Absolute: 0 10*3/uL (ref 0.0–0.1)
Eosinophils Absolute: 0.2 10*3/uL (ref 0.0–0.7)
HCT: 39.9 % (ref 36.0–46.0)
Hemoglobin: 13.2 g/dL (ref 12.0–15.0)
Lymphs Abs: 2.9 10*3/uL (ref 0.7–4.0)
MCHC: 33.1 g/dL (ref 30.0–36.0)
Monocytes Absolute: 0.6 10*3/uL (ref 0.1–1.0)
Neutro Abs: 4 10*3/uL (ref 1.4–7.7)
Platelets: 241 10*3/uL (ref 150.0–400.0)
RDW: 14.9 % — ABNORMAL HIGH (ref 11.5–14.6)

## 2011-06-15 LAB — POCT URINALYSIS DIPSTICK
Nitrite, UA: NEGATIVE
Urobilinogen, UA: 0.2
pH, UA: 5.5

## 2011-06-15 MED ORDER — ALENDRONATE SODIUM 70 MG PO TABS: 70.0000 mg | ORAL_TABLET | ORAL | Status: DC

## 2011-06-15 MED ORDER — ALPRAZOLAM 0.5 MG PO TABS: 0.5000 mg | ORAL_TABLET | Freq: Two times a day (BID) | ORAL | Status: DC | PRN

## 2011-06-15 MED ORDER — HYDROCHLOROTHIAZIDE 25 MG PO TABS: 25.0000 mg | ORAL_TABLET | Freq: Every day | ORAL | Status: DC

## 2011-06-15 MED ORDER — SIMVASTATIN 40 MG PO TABS: 40.0000 mg | ORAL_TABLET | Freq: Every day | ORAL | Status: DC

## 2011-06-15 NOTE — Progress Notes (Signed)
Patient ID: Tammy [name], female   DOB: 02/17/1931, 78 y.o.   MRN: [id number]  Subjective:    Patient ID: Tammy [name], female    DOB: May 22, 1931, 78 y.o.   MRN: [id number]  Hypertension Pertinent negatives include no chest pain, headaches, palpitations or shortness of breath.  Hyperlipidemia Pertinent negatives include no chest pain or shortness of breath.  78 year old patient who is seen today for a annual exam. She has done quite well without concerns or complaints. She has treated hypertension and dyslipidemia. Denies any cardiopulmonary complaints. Her only complaints today are urinary frequency. She also has nocturia several times through the night and a sense of urgency. A fasting blood sugar this morning 93. She does have a history of mild impaired glucose tolerance. No dysuria  1. Risk factors, based on past  M,S,F history- cardiovascular risk factors include age hypertension and dyslipidemia 2.  Physical activities: Fairly active and independent but no rigorous exercise program  3.  Depression/mood: No history depression or mood disorder  4.  Hearing: No major deficits  5.  ADL's: Remains independent in all aspects of daily living  6.  Fall risk: Moderate due to age  60.  Home safety: No problems identified  8.  Height weight, and visual acuity; height and weight fairly stable. There has been a voluntary 8 pound weight loss no change in visual acuity  9.  Counseling: Heart healthy diet more regular exercise are encouraged 10. Lab orders based on risk factors: Laboratory profile including lipid panel will be reviewed  11. Referral : Not appropriate at this time  12. Care plan: Low-salt heart healthy diet encouraged we'll continue calcium and vitamin D supplements as well as low-dose aspirin  13. Cognitive assessment: Alert and oriented with normal affect. No cognitive dysfunction      Preventive Screening-Counseling & Management  Alcohol-Tobacco  Smoking Status: never    Allergies (verified):  No Known Drug Allergies   Past History:  Past Medical History:  Reviewed history from 06/10/2006 and no changes required.  Hyperlipidemia  Hypertension  Osteoporosis   Past Surgical History:  Reviewed history from 02/04/2009 and no changes required.  Appendectomy  Cholecystectomy  Hysterectomy  gravida 4, para 4, abortus zero  breast biopsies x 2 in 1985, 1998  colonoscopy- none (declines)   Family History:  Reviewed history from 01/19/2007 and no changes required.  father died at age 36, history of asthma, CAD  mother died age 65 of CAD  One brother died of accidental death  two half sisters, positive CAD  one sister is well   Social History:  Reviewed history from 01/19/2007 and no changes required.  9 grandchildren and 9 great grandchildren     Review of Systems  Constitutional: Negative for fever, appetite change, fatigue and unexpected weight change.  HENT: Negative for hearing loss, ear pain, nosebleeds, congestion, sore throat, mouth sores, trouble swallowing, neck stiffness, dental problem, voice change, sinus pressure and tinnitus.   Eyes: Negative for photophobia, pain, redness and visual disturbance.  Respiratory: Negative for cough, chest tightness and shortness of breath.   Cardiovascular: Negative for chest pain, palpitations and leg swelling.  Gastrointestinal: Negative for nausea, vomiting, abdominal pain, diarrhea, constipation, blood in stool, abdominal distention and rectal pain.  Genitourinary: Negative for dysuria, urgency, frequency, hematuria, flank pain, vaginal bleeding, vaginal discharge, difficulty urinating, genital sores, vaginal pain, menstrual problem and pelvic pain.  Musculoskeletal: Negative for back pain and arthralgias.  Skin: Negative for rash.  Neurological: Negative  for dizziness, syncope, speech difficulty, weakness, light-headedness, numbness and headaches.  Hematological: Negative for adenopathy. Does  not bruise/bleed easily.  Psychiatric/Behavioral: Negative for suicidal ideas, behavioral problems, self-injury, dysphoric mood and agitation. The patient is not nervous/anxious.         Objective:   Physical Exam  Constitutional: She is oriented to person, place, and time. She appears well-developed and well-nourished.  HENT:  Head: Normocephalic and atraumatic.  Right Ear: External ear normal.  Left Ear: External ear normal.  Mouth/Throat: Oropharynx is clear and moist.  Eyes: Conjunctivae and EOM are normal.  Neck: Normal range of motion. Neck supple. No JVD present. No thyromegaly present.  Cardiovascular: Normal rate, regular rhythm, normal heart sounds and intact distal pulses.   No murmur heard.      Dorsalis pedis pulses were full posterior tibial pulses are not easily palpable. Distal feet were cool and slightly cyanotic  Pulmonary/Chest: Effort normal and breath sounds normal. She has no wheezes. She has no rales.  Abdominal: Soft. Bowel sounds are normal. She exhibits no distension and no mass. There is no tenderness. There is no rebound and no guarding.  Genitourinary: Vagina normal. Guaiac negative stool. No vaginal discharge found.       Cystocele  Musculoskeletal: Normal range of motion. She exhibits no edema and no tenderness.  Neurological: She is alert and oriented to person, place, and time. She has normal reflexes. No cranial nerve deficit. She exhibits normal muscle tone. Coordination normal.  Skin: Skin is warm and dry. No rash noted.  Psychiatric: She has a normal mood and affect. Her behavior is normal.          Assessment & Plan:   Unremarkable health maintenance examination Hypertension stable Hypercholesterolemia. We'll check a lipid panel Osteoporosis. Continue Fosamax calcium and vitamin D Probable overactive bladder. We'll check a UA if this is normal and free of infection will have a trial of an anticholinergic  Recheck 6 months

## 2011-06-15 NOTE — Patient Instructions (Signed)
Limit your sodium (Salt) intake    It is important that you exercise regularly, at least 20 minutes 3 to 4 times per week.  If you develop chest pain or shortness of breath seek  medical attention.  Take a calcium supplement, plus [phone] units of vitamin DOveractive Bladder, Adult The bladder has two functions that are totally opposite of the other. One is to relax and stretch out so it can store urine (fills like a balloon), and the other is to contract and squeeze down so that it can empty the urine that it has stored. Proper functioning of the bladder is a complex mixing of these two functions. The filling and emptying of the bladder can be influenced by:  The bladder.   The spinal cord.   The brain.   The nerves going to the bladder.   Other organs that are closely related to the bladder such as prostate in males and the vagina in females.  As your bladder fills with urine, nerve signals are sent from the bladder to the brain to tell you that you may need to urinate. Normal urination requires that the bladder squeeze down with sufficient strength to empty the bladder, but this also requires that the bladder squeeze down sufficiently long to finish the job. In addition the sphincter muscles, which normally keep you from leaking urine, must also relax so that the urine can pass. Coordination between the bladder muscle squeezing down and the sphincter muscles relaxing is required to make everything happen normally. With an overactive bladder sometimes the muscles of the bladder contract unexpectedly and involuntarily and this causes an urgent need to urinate. The normal response is to try to hold urine in by contracting the sphincter muscles. Sometimes the bladder contracts so strongly that the sphincter muscles cannot stop the urine from passing out and incontinence occurs. This kind of incontinence is called urge incontinence. Having an overactive bladder can be embarrassing and awkward. It  can keep you from living life the way you want to. Many people think it is just something you have to put up with as you grow older or have certain health conditions. In fact, there are treatments that can help make your life easier and more pleasant. CAUSES   Many things can cause an overactive bladder. Possibilities include:  Urinary tract infection or infection of nearby tissues such as the prostate.   Prostate enlargement.   In women, multiple pregnancies or surgery on the uterus or urethra.   Bladder stones, inflammation or tumors.   Caffeine.   Alcohol.   Medications. For example, diuretics (drugs that help the body get rid of extra fluid) increase urine production. Some other medicines must be taken with lots of fluids.   Muscle or nerve weakness. This might be the result of a spinal cord injury, a stroke, multiple sclerosis or Parkinson's disease.   Diabetes can cause a high urine volume which fills the bladder so quickly that the normal urge to urinate is triggered very strongly.  SYMPTOMS    Loss of bladder control. You feel the need to urinate and cannot make your body wait.   Sudden, strong urges to urinate.   Urinating 8 or more times a day.   Waking up to urinate two or more times a night.  DIAGNOSIS   To decide if you have overactive bladder, your healthcare provider will probably:  Ask about symptoms you have noticed.   Ask about your overall health. This will include questions  about any medications you are taking.   Do a physical examination. This will help determine if there are obvious blockages or other problems.   Order some tests. These might include:   A blood test to check for diabetes or other health issues that could be contributing to the problem.   Urine testing. This could measure the flow of urine and the pressure on the bladder.   A test of your neurological system (the brain, spinal cord and nerves). This is the system that senses the need  to urinate. Some of these tests are called flow tests, bladder pressure tests and electrical measurements of the sphincter muscle.   A bladder test to check whether it is emptying completely when you urinate.   Cytoscopy. This test uses a thin tube with a tiny camera on it. It offers a look inside your urethra and bladder to see if there are problems.   Imaging tests. You might be given a contrast dye and then asked to urinate. X-rays are taken to see how your bladder is working.  TREATMENT   An overactive bladder can be treated in many ways. The treatment will depend on the cause. Whether you have a mild or severe case also makes a difference. Often, treatment can be given in your healthcare provider's office or clinic. Be sure to discuss the different options with your caregiver. They include:  Behavioral treatments. These do not involve medication or surgery:   Bladder training. For this, you would follow a schedule to urinate at regular intervals. This helps you learn to control the urge to urinate. At first, you might be asked to wait a few minutes after feeling the urge. In time, you should be able to schedule bathroom visits an hour or more apart.   Kegel exercises. These exercises strengthen the pelvic floor muscles, which support the bladder. By toning these muscles, they can help control urination, even if the bladder muscles are overactive. A specialist will teach you how to do these exercises correctly. They will require daily practice.   Weight loss. If you are obese or overweight, losing weight might stop your bladder from being overactive. Talk to your healthcare provider about how many pounds you should lose. Also ask if there is a specific program or method that would work best for you.   Diet change. This might be suggested if constipation is making your overactive bladder worse. Your healthcare provider or a nutritionist can explain ways to change what you eat to ease  constipation. Other people might need to take in less caffeine or alcohol. Sometimes drinking fewer fluids is needed, too.   Protection. This is not an actual treatment. But, you could wear special pads to take care of any leakage while you wait for other treatments to take effect. This will help you avoid embarrassment.   Physical treatments.   Electrical stimulation. Electrodes will send gentle pulses to the nerves or muscles that help control the bladder. The goal is to strengthen them. Sometimes this is done with the electrodes outside of the body. Or, they might be placed inside the body (implanted). This treatment can take several months to have an effect.   Medications. These are usually used along with other treatments. Several medicines are available. Some are injected into the muscles involved in urination. Others come in pill form. Medications sometimes prescribed include:   Anticholinergics. These drugs block the signals that the nerves deliver to the bladder. This keeps it from releasing urine  at the wrong time. Researchers think the drugs might help in other ways, too.   Imipramine. This is an antidepressant. But, it relaxes bladder muscles.   Botox. This is still experimental. Some people believe that injecting it into the bladder muscles will relax them so they work more normally. It has also been injected into the sphincter muscle when the sphincter muscle does not open properly. This is a temporary fix, however. Also, it might make matters worse, especially in older people.   Surgery.   A device might be implanted to help manage your nerves. It works on the nerves that signal when you need to urinate.   Surgery is sometimes needed with electrical stimulation. If the electrodes are implanted, this is done through surgery.   Sometimes repairs need to be made through surgery. For example, the size of the bladder can be changed. This is usually done in severe cases only.  HOME  CARE INSTRUCTIONS    Take any medications your healthcare provider prescribed or suggested. Follow the directions carefully.   Practice any lifestyle changes that are recommended. These might include:   Drinking less fluid or drinking at different times of the day. If you need to urinate often during the night, for example, you may need to stop drinking fluids early in the evening.   Cutting down on caffeine or alcohol. They can both make an overactive bladder worse. Caffeine is found in coffee, tea and sodas.   Doing Kegel exercises to strengthen muscles.   Losing weight, if that is recommended.   Eating a healthy and balanced diet. This will help you avoid constipation.   Keep a journal or a log. You might be asked to record how much you drink and when, and also when you feel the need to urinate.   Learn how to care for implants or other devices, such as pessaries.  SEEK MEDICAL CARE IF:    Your overactive bladder gets worse.   You feel increased pain or irritation when you urinate.   You notice blood in your urine.   You have questions about any medications or devices that your healthcare provider recommended.   You notice blood, pus or swelling at the site of any test or treatment procedure.   You have an oral temperature above 102 F (38.9 C).  SEEK IMMEDIATE MEDICAL CARE IF:   You have an oral temperature above 102 F (38.9 C), not controlled by medicine. Document Released: 11/08/2008 Document Revised: 01/01/2011 Document Reviewed: 11/08/2008 [facility] Patient Information 2012 [place], [place].Overactive Bladder, Adult The bladder has two functions that are totally opposite of the other. One is to relax and stretch out so it can store urine (fills like a balloon), and the other is to contract and squeeze down so that it can empty the urine that it has stored. Proper functioning of the bladder is a complex mixing of these two functions. The filling and emptying of the bladder  can be influenced by:  The bladder.   The spinal cord.   The brain.   The nerves going to the bladder.   Other organs that are closely related to the bladder such as prostate in males and the vagina in females.  As your bladder fills with urine, nerve signals are sent from the bladder to the brain to tell you that you may need to urinate. Normal urination requires that the bladder squeeze down with sufficient strength to empty the bladder, but this also requires that the bladder  squeeze down sufficiently long to finish the job. In addition the sphincter muscles, which normally keep you from leaking urine, must also relax so that the urine can pass. Coordination between the bladder muscle squeezing down and the sphincter muscles relaxing is required to make everything happen normally. With an overactive bladder sometimes the muscles of the bladder contract unexpectedly and involuntarily and this causes an urgent need to urinate. The normal response is to try to hold urine in by contracting the sphincter muscles. Sometimes the bladder contracts so strongly that the sphincter muscles cannot stop the urine from passing out and incontinence occurs. This kind of incontinence is called urge incontinence. Having an overactive bladder can be embarrassing and awkward. It can keep you from living life the way you want to. Many people think it is just something you have to put up with as you grow older or have certain health conditions. In fact, there are treatments that can help make your life easier and more pleasant. CAUSES   Many things can cause an overactive bladder. Possibilities include:  Urinary tract infection or infection of nearby tissues such as the prostate.   Prostate enlargement.   In women, multiple pregnancies or surgery on the uterus or urethra.   Bladder stones, inflammation or tumors.   Caffeine.   Alcohol.   Medications. For example, diuretics (drugs that help the body get rid  of extra fluid) increase urine production. Some other medicines must be taken with lots of fluids.   Muscle or nerve weakness. This might be the result of a spinal cord injury, a stroke, multiple sclerosis or Parkinson's disease.   Diabetes can cause a high urine volume which fills the bladder so quickly that the normal urge to urinate is triggered very strongly.  SYMPTOMS    Loss of bladder control. You feel the need to urinate and cannot make your body wait.   Sudden, strong urges to urinate.   Urinating 8 or more times a day.   Waking up to urinate two or more times a night.  DIAGNOSIS   To decide if you have overactive bladder, your healthcare provider will probably:  Ask about symptoms you have noticed.   Ask about your overall health. This will include questions about any medications you are taking.   Do a physical examination. This will help determine if there are obvious blockages or other problems.   Order some tests. These might include:   A blood test to check for diabetes or other health issues that could be contributing to the problem.   Urine testing. This could measure the flow of urine and the pressure on the bladder.   A test of your neurological system (the brain, spinal cord and nerves). This is the system that senses the need to urinate. Some of these tests are called flow tests, bladder pressure tests and electrical measurements of the sphincter muscle.   A bladder test to check whether it is emptying completely when you urinate.   Cytoscopy. This test uses a thin tube with a tiny camera on it. It offers a look inside your urethra and bladder to see if there are problems.   Imaging tests. You might be given a contrast dye and then asked to urinate. X-rays are taken to see how your bladder is working.  TREATMENT   An overactive bladder can be treated in many ways. The treatment will depend on the cause. Whether you have a mild or severe case also makes a  difference. Often, treatment can be given in your healthcare provider's office or clinic. Be sure to discuss the different options with your caregiver. They include:  Behavioral treatments. These do not involve medication or surgery:   Bladder training. For this, you would follow a schedule to urinate at regular intervals. This helps you learn to control the urge to urinate. At first, you might be asked to wait a few minutes after feeling the urge. In time, you should be able to schedule bathroom visits an hour or more apart.   Kegel exercises. These exercises strengthen the pelvic floor muscles, which support the bladder. By toning these muscles, they can help control urination, even if the bladder muscles are overactive. A specialist will teach you how to do these exercises correctly. They will require daily practice.   Weight loss. If you are obese or overweight, losing weight might stop your bladder from being overactive. Talk to your healthcare provider about how many pounds you should lose. Also ask if there is a specific program or method that would work best for you.   Diet change. This might be suggested if constipation is making your overactive bladder worse. Your healthcare provider or a nutritionist can explain ways to change what you eat to ease constipation. Other people might need to take in less caffeine or alcohol. Sometimes drinking fewer fluids is needed, too.   Protection. This is not an actual treatment. But, you could wear special pads to take care of any leakage while you wait for other treatments to take effect. This will help you avoid embarrassment.   Physical treatments.   Electrical stimulation. Electrodes will send gentle pulses to the nerves or muscles that help control the bladder. The goal is to strengthen them. Sometimes this is done with the electrodes outside of the body. Or, they might be placed inside the body (implanted). This treatment can take several months to  have an effect.   Medications. These are usually used along with other treatments. Several medicines are available. Some are injected into the muscles involved in urination. Others come in pill form. Medications sometimes prescribed include:   Anticholinergics. These drugs block the signals that the nerves deliver to the bladder. This keeps it from releasing urine at the wrong time. Researchers think the drugs might help in other ways, too.   Imipramine. This is an antidepressant. But, it relaxes bladder muscles.   Botox. This is still experimental. Some people believe that injecting it into the bladder muscles will relax them so they work more normally. It has also been injected into the sphincter muscle when the sphincter muscle does not open properly. This is a temporary fix, however. Also, it might make matters worse, especially in older people.   Surgery.   A device might be implanted to help manage your nerves. It works on the nerves that signal when you need to urinate.   Surgery is sometimes needed with electrical stimulation. If the electrodes are implanted, this is done through surgery.   Sometimes repairs need to be made through surgery. For example, the size of the bladder can be changed. This is usually done in severe cases only.  HOME CARE INSTRUCTIONS    Take any medications your healthcare provider prescribed or suggested. Follow the directions carefully.   Practice any lifestyle changes that are recommended. These might include:   Drinking less fluid or drinking at different times of the day. If you need to urinate often during the night, for example,  you may need to stop drinking fluids early in the evening.   Cutting down on caffeine or alcohol. They can both make an overactive bladder worse. Caffeine is found in coffee, tea and sodas.   Doing Kegel exercises to strengthen muscles.   Losing weight, if that is recommended.   Eating a healthy and balanced diet. This  will help you avoid constipation.   Keep a journal or a log. You might be asked to record how much you drink and when, and also when you feel the need to urinate.   Learn how to care for implants or other devices, such as pessaries.  SEEK MEDICAL CARE IF:    Your overactive bladder gets worse.   You feel increased pain or irritation when you urinate.   You notice blood in your urine.   You have questions about any medications or devices that your healthcare provider recommended.   You notice blood, pus or swelling at the site of any test or treatment procedure.   You have an oral temperature above 102 F (38.9 C).  SEEK IMMEDIATE MEDICAL CARE IF:   You have an oral temperature above 102 F (38.9 C), not controlled by medicine. Document Released: 11/08/2008 Document Revised: 01/01/2011 Document Reviewed: 11/08/2008 [facility] Patient Information 2012 [place], [place].

## 2011-09-02 ENCOUNTER — Other Ambulatory Visit: Payer: Self-pay | Admitting: Internal Medicine

## 2011-09-02 NOTE — Telephone Encounter (Signed)
Pt was given samples of toviaz 4mg . Pt is requesting new rx call [facility]

## 2011-09-03 ENCOUNTER — Other Ambulatory Visit: Payer: Self-pay

## 2011-09-03 MED ORDER — FESOTERODINE FUMARATE ER 4 MG PO TB24: 4.0000 mg | ORAL_TABLET | Freq: Every day | ORAL | Status: DC

## 2011-09-03 NOTE — Telephone Encounter (Signed)
done

## 2011-09-21 ENCOUNTER — Ambulatory Visit (INDEPENDENT_AMBULATORY_CARE_PROVIDER_SITE_OTHER): Payer: Medicare Other | Admitting: Internal Medicine

## 2011-09-21 ENCOUNTER — Encounter: Payer: Self-pay | Admitting: Internal Medicine

## 2011-09-21 VITALS — BP 120/80 | Wt 169.0 lb

## 2011-09-21 DIAGNOSIS — N6089 Other benign mammary dysplasias of unspecified breast: Secondary | ICD-10-CM

## 2011-09-21 DIAGNOSIS — I10 Essential (primary) hypertension: Secondary | ICD-10-CM

## 2011-09-21 MED ORDER — CEPHALEXIN 500 MG PO CAPS: 500.0000 mg | ORAL_CAPSULE | Freq: Three times a day (TID) | ORAL | Status: DC

## 2011-09-21 NOTE — Progress Notes (Signed)
  Subjective:    Patient ID: Tammy [name], female    DOB: 04-Aug-1931, 78 y.o.   MRN: [id number]  HPI  78 year old patient has long history of a subcutaneous nodule/cyst involving her left upper outer breast. For the past 3 days she's had increasing tenderness and redness. There's been no drainage. She has had the breast biopsies in the past. She does obtain annual mammograms.    Review of Systems  Constitutional: Negative.   HENT: Negative for hearing loss, congestion, sore throat, rhinorrhea, dental problem, sinus pressure and tinnitus.   Eyes: Negative for pain, discharge and visual disturbance.  Respiratory: Negative for cough and shortness of breath.   Cardiovascular: Negative for chest pain, palpitations and leg swelling.  Gastrointestinal: Negative for nausea, vomiting, abdominal pain, diarrhea, constipation, blood in stool and abdominal distention.  Genitourinary: Negative for dysuria, urgency, frequency, hematuria, flank pain, vaginal bleeding, vaginal discharge, difficulty urinating, vaginal pain and pelvic pain.  Musculoskeletal: Negative for joint swelling, arthralgias and gait problem.  Skin: Positive for wound. Negative for rash.  Neurological: Negative for dizziness, syncope, speech difficulty, weakness, numbness and headaches.  Hematological: Negative for adenopathy.  Psychiatric/Behavioral: Negative for behavioral problems, dysphoric mood and agitation. The patient is not nervous/anxious.        Objective:   Physical Exam  Constitutional: She appears well-developed and well-nourished. No distress.       Blood pressure well controlled  Pulmonary/Chest:       A 2 x 3 cm firm slightly tender erythematous subcutaneous nodule noted involving her left upper outer breast region          Assessment & Plan:   Inflamed sebaceous cyst. Will treat with warm compresses antibiotic therapy. If this fails to improve we'll set up for surgical excision Hypertension stable

## 2011-09-21 NOTE — Patient Instructions (Signed)
Take your antibiotic as prescribed until ALL of it is gone, but stop if you develop a rash, swelling, or any side effects of the medication.  Contact our office as soon as possible if  there are side effects of the medication.  Warm compresses to the affected area 3 times daily  Call or return to clinic prn if these symptoms worsen or fail to improve as anticipated.   Epidermal Cyst An epidermal cyst is sometimes called a sebaceous cyst, epidermal inclusion cyst, or infundibular cyst. These cysts usually contain a substance that looks "pasty" or "cheesy" and may have a bad smell. This substance is a protein called keratin. Epidermal cysts are usually found on the face, neck, or trunk. They may also occur in the vaginal area or other parts of the genitalia of both men and women. Epidermal cysts are usually small, painless, slow-growing bumps or lumps that move freely under the skin. It is important not to try to pop them. This may cause an infection and lead to tenderness and swelling. CAUSES   Epidermal cysts may be caused by a deep penetrating injury to the skin or a plugged hair follicle, often associated with acne. SYMPTOMS   Epidermal cysts can become inflamed and cause:  Redness.   Tenderness.   Increased temperature of the skin over the bumps or lumps.   Grayish-white, bad smelling material that drains from the bump or lump.  DIAGNOSIS   Epidermal cysts are easily diagnosed by your caregiver during an exam. Rarely, a tissue sample (biopsy) may be taken to rule out other conditions that may resemble epidermal cysts. TREATMENT    Epidermal cysts often get better and disappear on their own. They are rarely ever cancerous.   If a cyst becomes infected, it may become inflamed and tender. This may require opening and draining the cyst. Treatment with antibiotics may be necessary. When the infection is gone, the cyst may be removed with minor surgery.   Small, inflamed cysts can often be  treated with antibiotics or by injecting steroid medicines.   Sometimes, epidermal cysts become large and bothersome. If this happens, surgical removal in your caregiver's office may be necessary.  HOME CARE INSTRUCTIONS  Only take over-the-counter or prescription medicines as directed by your caregiver.   Take your antibiotics as directed. Finish them even if you start to feel better.  SEEK MEDICAL CARE IF:    Your cyst becomes tender, red, or swollen.   Your condition is not improving or is getting worse.   You have any other questions or concerns.  MAKE SURE YOU:  Understand these instructions.   Will watch your condition.   Will get help right away if you are not doing well or get worse.  Document Released: 12/14/2003 Document Revised: 01/01/2011 Document Reviewed: 07/21/2010 [facility] Patient Information 2012 [place], [place].

## 2011-10-01 ENCOUNTER — Other Ambulatory Visit: Payer: Self-pay | Admitting: Internal Medicine

## 2011-10-01 NOTE — Telephone Encounter (Signed)
Last seen 09/21/11 - cyst on breat - rx'd at that time - please advise

## 2011-10-02 NOTE — Telephone Encounter (Signed)
Ok needs ROV if no complete resolution

## 2011-10-14 ENCOUNTER — Encounter (INDEPENDENT_AMBULATORY_CARE_PROVIDER_SITE_OTHER): Payer: Self-pay | Admitting: General Surgery

## 2011-10-14 ENCOUNTER — Ambulatory Visit (INDEPENDENT_AMBULATORY_CARE_PROVIDER_SITE_OTHER): Payer: Medicare Other | Admitting: General Surgery

## 2011-10-14 VITALS — BP 122/68 | HR 70 | Temp 98.1°F | Resp 16 | Ht 67.0 in | Wt 168.4 lb

## 2011-10-14 DIAGNOSIS — L729 Follicular cyst of the skin and subcutaneous tissue, unspecified: Secondary | ICD-10-CM | POA: Insufficient documentation

## 2011-10-14 DIAGNOSIS — L089 Local infection of the skin and subcutaneous tissue, unspecified: Secondary | ICD-10-CM

## 2011-10-14 DIAGNOSIS — L723 Sebaceous cyst: Secondary | ICD-10-CM

## 2011-10-14 MED ORDER — CEPHALEXIN 500 MG PO CAPS: 500.0000 mg | ORAL_CAPSULE | Freq: Three times a day (TID) | ORAL | Status: AC

## 2011-10-14 NOTE — Progress Notes (Signed)
Patient ID: Tammy [name], female   DOB: 1932/01/26, 78 y.o.   MRN: [id number]  Chief Complaint  Patient presents with  . Cyst    Breast    HPI Tammy [name] is a 78 y.o. female With a 3-week history of left breast erythema and tenderness to a known subcutaneous mass which is thought to be a sebaceous cyst. Patient states she's had some drainage resembles pus draining from the area. Patient was seen by her PCP and started on Keflex for a total of 10 days. She states that the erythema and tenderness has resolved over this period of time.  The patient states she has had no fevers while at home over this.  HPI  Past Medical History  Diagnosis Date  . BREAST BIOPSY, HX OF 06/10/2006  . HIP PAIN, LEFT 01/31/2007  . HYPERLIPIDEMIA 06/10/2006  . HYPERTENSION 06/10/2006  . NEOPLASMS UNSPEC NATURE BONE SOFT TISSUE&SKIN 05/31/2008  . OSTEOPOROSIS 06/10/2006  . Impaired glucose tolerance   . Breast cyst     Past Surgical History  Procedure Date  . Appendectomy   . Abdominal hysterectomy   . Breast surgery     bx x2  . Cholecystectomy 1980    Family History  Problem Relation Age of Onset  . Heart disease Mother   . Heart disease Father   . Asthma Father   . Cancer Maternal Aunt     breast    Social History History  Substance Use Topics  . Smoking status: Never Smoker   . Smokeless tobacco: Current User    Types: Snuff  . Alcohol Use: No    No Known Allergies  Current Outpatient Prescriptions  Medication Sig Dispense Refill  . alendronate (FOSAMAX) 70 MG tablet Take 1 tablet (70 mg total) by mouth every 7 (seven) days. Take with a full glass of water on an empty stomach.  12 tablet  6  . ALPRAZolam (XANAX) 0.5 MG tablet Take 1 tablet (0.5 mg total) by mouth 2 (two) times daily as needed.  60 tablet  4  . aspirin (ASPIRIN CHILDRENS) 81 MG chewable tablet Chew 1 tablet (81 mg total) by mouth daily.  36 tablet  11  . cephALEXin (KEFLEX) 500 MG capsule TAKE 1 CAPSULE THREE TIMES A DAY  30  capsule  0  . fesoterodine (TOVIAZ) 4 MG TB24 Take 1 tablet (4 mg total) by mouth daily.  90 tablet  1  . hydrochlorothiazide (HYDRODIURIL) 25 MG tablet Take 1 tablet (25 mg total) by mouth daily.  90 tablet  6  . simvastatin (ZOCOR) 40 MG tablet Take 1 tablet (40 mg total) by mouth daily.  90 tablet  6    Review of Systems Review of Systems  Constitutional: Negative.   HENT: Negative.   Eyes: Negative.   Gastrointestinal: Negative.   Musculoskeletal: Negative.   Neurological: Negative.     Blood pressure 122/68, pulse 70, temperature 98.1 F (36.7 C), temperature source Temporal, resp. rate 16, height 5\' 7"  (1.702 m), weight 168 lb 6 oz (76.374 kg).  Physical Exam Physical Exam  Constitutional: She is oriented to person, place, and time. She appears well-developed and well-nourished.  HENT:  Head: Normocephalic and atraumatic.  Eyes: Conjunctivae normal are normal. Pupils are equal, round, and reactive to light.  Neck: Normal range of motion. Neck supple.  Cardiovascular: Normal rate and regular rhythm.   Pulmonary/Chest: Effort normal and breath sounds normal.         Small area sub-cutaneous  masswith minimal amount of purulence expressed. No erythema  Abdominal: Soft. Bowel sounds are normal.  Musculoskeletal: Normal range of motion.  Neurological: She is alert and oriented to person, place, and time.      Assessment    78 year old female with a likely resolving infected sebaceous cyst.    Plan    1. Or represcribed Keflex for 7 days outpatient followup in 10-14 days.  2. Should there become more erythematous or increased drainage patient will return in clinic prior to this for evaluation for possible incision and drainage of the wound       Marigene Ehlers., Jed Limerick 10/14/2011, 3:19 PM

## 2011-10-29 ENCOUNTER — Ambulatory Visit (INDEPENDENT_AMBULATORY_CARE_PROVIDER_SITE_OTHER): Payer: Medicare Other | Admitting: General Surgery

## 2011-10-29 ENCOUNTER — Encounter (INDEPENDENT_AMBULATORY_CARE_PROVIDER_SITE_OTHER): Payer: Self-pay | Admitting: General Surgery

## 2011-10-29 VITALS — BP 140/62 | HR 84 | Temp 97.5°F | Resp 16 | Ht 67.0 in | Wt 166.4 lb

## 2011-10-29 DIAGNOSIS — N61 Mastitis without abscess: Secondary | ICD-10-CM

## 2011-10-29 NOTE — Progress Notes (Signed)
Subjective:     Patient ID: Tammy [name], female   DOB: 25-Mar-1931, 78 y.o.   MRN: [id number]  HPI The patient returns for a repeat check of her left breast infected lesion.  The patient is complete her antibiotic course. She complaints ofno fever chills wound. She does state she has unable to express some purulence from the wound, but has decreased over the last several days.  Review of Systems  All other systems reviewed and are negative.       Objective:   Physical Exam  Constitutional: She is oriented to person, place, and time. She appears well-developed and well-nourished.  HENT:  Head: Normocephalic and atraumatic.  Eyes: Pupils are equal, round, and reactive to light.  Neck: Normal range of motion. Neck supple.  Cardiovascular: Normal rate and regular rhythm.   Pulmonary/Chest: Effort normal and breath sounds normal.         Healing area of previously infected wound site  Abdominal: Soft. Bowel sounds are normal.  Neurological: She is alert and oriented to person, place, and time.       Assessment:     78 year old female with a possibly infected sebaceous cyst. Patient completed her antibiotic therapy and the area seems to be healing at this point.  Patient is not to continue antibiotic treatment at this time. I discussed possibilities of a possible sebaceous cyst that could be causing this issue and discussed the symptoms to return for possible evaluation excision of sebaceous cyst.    Plan:     When necessary follow up

## 2011-12-14 ENCOUNTER — Encounter: Payer: Self-pay | Admitting: Internal Medicine

## 2011-12-14 ENCOUNTER — Ambulatory Visit (INDEPENDENT_AMBULATORY_CARE_PROVIDER_SITE_OTHER): Payer: Medicare Other | Admitting: Internal Medicine

## 2011-12-14 VITALS — BP 134/84 | HR 78 | Temp 98.0°F | Resp 16 | Wt 167.0 lb

## 2011-12-14 DIAGNOSIS — Z23 Encounter for immunization: Secondary | ICD-10-CM

## 2011-12-14 DIAGNOSIS — E785 Hyperlipidemia, unspecified: Secondary | ICD-10-CM

## 2011-12-14 DIAGNOSIS — I10 Essential (primary) hypertension: Secondary | ICD-10-CM

## 2011-12-14 NOTE — Progress Notes (Signed)
Subjective:    Patient ID: Tammy [name], female    DOB: May 05, 1931, 78 y.o.   MRN: [id number]  HPI  78 year old patient who has hypertension and dyslipidemia. She has done quite well. No concerns or complaints. She denies any cardiopulmonary complaints. She has seen general surgery for an infected sebaceous cyst involving the left breast region. This has nicely resolved.  Past Medical History  Diagnosis Date  . BREAST BIOPSY, HX OF 06/10/2006  . HIP PAIN, LEFT 01/31/2007  . HYPERLIPIDEMIA 06/10/2006  . HYPERTENSION 06/10/2006  . NEOPLASMS UNSPEC NATURE BONE SOFT TISSUE&SKIN 05/31/2008  . OSTEOPOROSIS 06/10/2006  . Impaired glucose tolerance   . Breast cyst     History   Social History  . Marital Status: Married    Spouse Name: N/A    Number of Children: N/A  . Years of Education: N/A   Occupational History  . Not on file.   Social History Main Topics  . Smoking status: Never Smoker   . Smokeless tobacco: Current User    Types: Snuff  . Alcohol Use: No  . Drug Use: No  . Sexually Active: Not on file   Other Topics Concern  . Not on file   Social History Narrative  . No narrative on file    Past Surgical History  Procedure Date  . Appendectomy   . Abdominal hysterectomy   . Breast surgery     bx x2  . Cholecystectomy 1980    Family History  Problem Relation Age of Onset  . Heart disease Mother   . Heart disease Father   . Asthma Father   . Cancer Maternal Aunt     breast    No Known Allergies  Current Outpatient Prescriptions on File Prior to Visit  Medication Sig Dispense Refill  . alendronate (FOSAMAX) 70 MG tablet Take 1 tablet (70 mg total) by mouth every 7 (seven) days. Take with a full glass of water on an empty stomach.  12 tablet  6  . ALPRAZolam (XANAX) 0.5 MG tablet Take 1 tablet (0.5 mg total) by mouth 2 (two) times daily as needed.  60 tablet  4  . aspirin (ASPIRIN CHILDRENS) 81 MG chewable tablet Chew 1 tablet (81 mg total) by mouth daily.  36  tablet  11  . fesoterodine (TOVIAZ) 4 MG TB24 Take 1 tablet (4 mg total) by mouth daily.  90 tablet  1  . hydrochlorothiazide (HYDRODIURIL) 25 MG tablet Take 1 tablet (25 mg total) by mouth daily.  90 tablet  6  . simvastatin (ZOCOR) 40 MG tablet Take 1 tablet (40 mg total) by mouth daily.  90 tablet  6    BP 134/84  Pulse 78  Temp 98 F (36.7 C) (Oral)  Resp 16  Wt 167 lb (75.751 kg)       Review of Systems  Constitutional: Negative.   HENT: Negative for hearing loss, congestion, sore throat, rhinorrhea, dental problem, sinus pressure and tinnitus.   Eyes: Negative for pain, discharge and visual disturbance.  Respiratory: Negative for cough and shortness of breath.   Cardiovascular: Negative for chest pain, palpitations and leg swelling.  Gastrointestinal: Negative for nausea, vomiting, abdominal pain, diarrhea, constipation, blood in stool and abdominal distention.  Genitourinary: Negative for dysuria, urgency, frequency, hematuria, flank pain, vaginal bleeding, vaginal discharge, difficulty urinating, vaginal pain and pelvic pain.  Musculoskeletal: Negative for joint swelling, arthralgias and gait problem.  Skin: Negative for rash.  Neurological: Negative for dizziness, syncope, speech difficulty,  weakness, numbness and headaches.  Hematological: Negative for adenopathy.  Psychiatric/Behavioral: Negative for behavioral problems, dysphoric mood and agitation. The patient is not nervous/anxious.        Objective:   Physical Exam  Constitutional: She is oriented to person, place, and time. She appears well-developed and well-nourished.  HENT:  Head: Normocephalic.  Right Ear: External ear normal.  Left Ear: External ear normal.  Mouth/Throat: Oropharynx is clear and moist.  Eyes: Conjunctivae normal and EOM are normal. Pupils are equal, round, and reactive to light.  Neck: Normal range of motion. Neck supple. No thyromegaly present.  Cardiovascular: Normal rate, regular  rhythm, normal heart sounds and intact distal pulses.   Pulmonary/Chest: Effort normal and breath sounds normal.  Abdominal: Soft. Bowel sounds are normal. She exhibits no mass. There is no tenderness.  Musculoskeletal: Normal range of motion.  Lymphadenopathy:    She has no cervical adenopathy.  Neurological: She is alert and oriented to person, place, and time.  Skin: Skin is warm and dry. No rash noted.  Psychiatric: She has a normal mood and affect. Her behavior is normal.          Assessment & Plan:    Hypertension. Blood pressure well controlled. We'll continue present regimen Dyslipidemia. Continues to tolerate simvastatin 40 we'll continue present treatment.  CPX in 6 months

## 2011-12-14 NOTE — Patient Instructions (Signed)
Limit your sodium (Salt) intake    It is important that you exercise regularly, at least 20 minutes 3 to 4 times per week.  If you develop chest pain or shortness of breath seek  medical attention.  Return in 6 months for follow-up  

## 2012-01-29 ENCOUNTER — Other Ambulatory Visit: Payer: Self-pay | Admitting: Internal Medicine

## 2012-02-18 ENCOUNTER — Other Ambulatory Visit: Payer: Self-pay | Admitting: Internal Medicine

## 2012-04-02 ENCOUNTER — Emergency Department (HOSPITAL_COMMUNITY): Payer: Medicare PPO

## 2012-04-02 ENCOUNTER — Encounter (HOSPITAL_COMMUNITY): Payer: Self-pay | Admitting: Emergency Medicine

## 2012-04-02 ENCOUNTER — Inpatient Hospital Stay (HOSPITAL_COMMUNITY)
Admission: EM | Admit: 2012-04-02 | Discharge: 2012-04-04 | DRG: 312 | Disposition: A | Discharge status: Home / Self Care | Payer: Medicare PPO | Attending: Internal Medicine | Admitting: Internal Medicine

## 2012-04-02 ENCOUNTER — Other Ambulatory Visit: Payer: Self-pay

## 2012-04-02 DIAGNOSIS — M810 Age-related osteoporosis without current pathological fracture: Secondary | ICD-10-CM

## 2012-04-02 DIAGNOSIS — D492 Neoplasm of unspecified behavior of bone, soft tissue, and skin: Secondary | ICD-10-CM

## 2012-04-02 DIAGNOSIS — Z79899 Other long term (current) drug therapy: Secondary | ICD-10-CM

## 2012-04-02 DIAGNOSIS — M25559 Pain in unspecified hip: Secondary | ICD-10-CM

## 2012-04-02 DIAGNOSIS — L089 Local infection of the skin and subcutaneous tissue, unspecified: Secondary | ICD-10-CM

## 2012-04-02 DIAGNOSIS — Z9189 Other specified personal risk factors, not elsewhere classified: Secondary | ICD-10-CM

## 2012-04-02 DIAGNOSIS — E785 Hyperlipidemia, unspecified: Secondary | ICD-10-CM

## 2012-04-02 DIAGNOSIS — R55 Syncope and collapse: Principal | ICD-10-CM

## 2012-04-02 DIAGNOSIS — I10 Essential (primary) hypertension: Secondary | ICD-10-CM

## 2012-04-02 LAB — CBC
HCT: 39.3 % (ref 36.0–46.0)
Hemoglobin: 13.2 g/dL (ref 12.0–15.0)
MCH: 30.6 pg (ref 26.0–34.0)
MCHC: 33.6 g/dL (ref 30.0–36.0)
MCV: 91.2 fL (ref 78.0–100.0)

## 2012-04-02 LAB — TROPONIN I: Troponin I: <0.3 ng/mL (ref <0.30)

## 2012-04-02 LAB — COMPREHENSIVE METABOLIC PANEL
Alkaline Phosphatase: 75 U/L (ref 39–117)
BUN: 29 mg/dL — ABNORMAL HIGH (ref 6–23)
Creatinine, Ser: 0.99 mg/dL (ref 0.50–1.10)
GFR calc Af Amer: 62 mL/min — ABNORMAL LOW (ref >90)
Glucose, Bld: 109 mg/dL — ABNORMAL HIGH (ref 70–99)
Potassium: 4.5 mEq/L (ref 3.5–5.1)
Total Protein: 7.4 g/dL (ref 6.0–8.3)

## 2012-04-02 LAB — POCT I-STAT TROPONIN I: Troponin i, poc: 0 ng/mL (ref 0.00–0.08)

## 2012-04-02 MED ORDER — ONDANSETRON HCL 4 MG/2ML IJ SOLN: 4.0000 mg | Freq: Four times a day (QID) | INTRAMUSCULAR | Status: DC | PRN

## 2012-04-02 MED ORDER — SODIUM CHLORIDE 0.9 % IV SOLN: Freq: Once | INTRAVENOUS | Status: AC

## 2012-04-02 MED ORDER — FENTANYL CITRATE 0.05 MG/ML IJ SOLN
12.5000 ug | Freq: Once | INTRAMUSCULAR | Status: AC
  Administered 2012-04-02: 12.5 ug via INTRAVENOUS
  Filled 2012-04-02: qty 2

## 2012-04-02 MED ORDER — HEPARIN SODIUM (PORCINE) 5000 UNIT/ML IJ SOLN
5000.0000 [IU] | Freq: Three times a day (TID) | INTRAMUSCULAR | Status: DC
  Administered 2012-04-02 – 2012-04-04 (×5): 5000 [IU] via SUBCUTANEOUS
  Filled 2012-04-02 (×8): qty 1

## 2012-04-02 MED ORDER — FENTANYL CITRATE 0.05 MG/ML IJ SOLN
25.0000 ug | Freq: Once | INTRAMUSCULAR | Status: AC
  Administered 2012-04-02: 25 ug via INTRAVENOUS
  Filled 2012-04-02: qty 2

## 2012-04-02 MED ORDER — ACETAMINOPHEN 650 MG RE SUPP: 650.0000 mg | Freq: Four times a day (QID) | RECTAL | Status: DC | PRN

## 2012-04-02 MED ORDER — ONDANSETRON HCL 4 MG PO TABS: 4.0000 mg | ORAL_TABLET | Freq: Four times a day (QID) | ORAL | Status: DC | PRN

## 2012-04-02 MED ORDER — ASPIRIN EC 81 MG PO TBEC
81.0000 mg | DELAYED_RELEASE_TABLET | Freq: Every day | ORAL | Status: DC
  Administered 2012-04-02 – 2012-04-04 (×3): 81 mg via ORAL
  Filled 2012-04-02 (×3): qty 1

## 2012-04-02 MED ORDER — OXYCODONE HCL 5 MG PO TABS
5.0000 mg | ORAL_TABLET | ORAL | Status: DC | PRN
  Administered 2012-04-02 – 2012-04-03 (×4): 5 mg via ORAL
  Filled 2012-04-02 (×4): qty 1

## 2012-04-02 MED ORDER — ALPRAZOLAM 0.5 MG PO TABS: 0.5000 mg | ORAL_TABLET | Freq: Two times a day (BID) | ORAL | Status: DC | PRN

## 2012-04-02 MED ORDER — SODIUM CHLORIDE 0.9 % IV BOLUS (SEPSIS)
500.0000 mL | Freq: Once | INTRAVENOUS | Status: AC
  Administered 2012-04-02: 500 mL via INTRAVENOUS

## 2012-04-02 MED ORDER — SODIUM CHLORIDE 0.9 % IV SOLN
INTRAVENOUS | Status: DC
  Administered 2012-04-02: 23:00:00 via INTRAVENOUS
  Administered 2012-04-02: 75 mL via INTRAVENOUS
  Administered 2012-04-03: 13:00:00 via INTRAVENOUS

## 2012-04-02 MED ORDER — ACETAMINOPHEN 325 MG PO TABS: 650.0000 mg | ORAL_TABLET | Freq: Four times a day (QID) | ORAL | Status: DC | PRN

## 2012-04-02 MED ORDER — SODIUM CHLORIDE 0.9 % IJ SOLN
3.0000 mL | Freq: Two times a day (BID) | INTRAMUSCULAR | Status: DC
  Administered 2012-04-03: 3 mL via INTRAVENOUS
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
## 2012-04-02 NOTE — Progress Notes (Signed)
Pt arrived to the unit via stretcher with dx syncope and orhostatic hypotension. Pt is alert and oriented x4.  x1 assist to bedside commode. No skin break down noted. Vital signs stable. Pt complain of moderate pain in her chest mainly soreness. Pt oriented to unit and staff. Will cont to monitor.

## 2012-04-02 NOTE — ED Provider Notes (Signed)
History     CSN: [id number]  Arrival date & time 04/02/12  1109   First MD Initiated Contact with Patient 04/02/12 1120      Chief Complaint  Patient presents with  . Loss of Consciousness    (Consider location/radiation/quality/duration/timing/severity/associated sxs/prior treatment) HPI Tammy [name] is a 79 y.o. female who presents to ED with complaint of a syncopal episode today. Per husband who was with the pt, pt was sitting down to drink coffee, when she said her vision was going black, and she vomited, which was followed by a syncopal episode. Per pt, all she remembers is her vision was going dark and waking up on the floor. Per husband, pt was lowered to the ground, where it appeared that pt was not breathing. Husband states, "her lips were blue and she was not breathing." A bystander started CPR. Per husband, he does not believe they checked pt's pulse. However, few seconds into CPR, husband states pt started to breath and became alert. PT complaining now of her chest hurting. Pt states she is feeling back to baseline otherwise. There was no urinary incontinence, no loss of bowls, no chest pain prior to the event, no headache. Per family, pt had similar episode while at home a month ago. Has not seen PCP since then.     Past Medical History  Diagnosis Date  . BREAST BIOPSY, HX OF 06/10/2006  . HIP PAIN, LEFT 01/31/2007  . HYPERLIPIDEMIA 06/10/2006  . HYPERTENSION 06/10/2006  . NEOPLASMS UNSPEC NATURE BONE SOFT TISSUE&SKIN 05/31/2008  . OSTEOPOROSIS 06/10/2006  . Impaired glucose tolerance   . Breast cyst     Past Surgical History  Procedure Laterality Date  . Appendectomy    . Abdominal hysterectomy    . Breast surgery      bx x2  . Cholecystectomy  1980    Family History  Problem Relation Age of Onset  . Heart disease Mother   . Heart disease Father   . Asthma Father   . Cancer Maternal Aunt     breast    History  Substance Use Topics  . Smoking status: Never  Smoker   . Smokeless tobacco: Current User    Types: Snuff  . Alcohol Use: No    OB History   Grav Para Term Preterm Abortions TAB SAB Ect Mult Living                  Review of Systems  Eyes: Positive for visual disturbance.  Respiratory: Negative for shortness of breath.   Cardiovascular: Positive for chest pain.  Neurological: Positive for syncope. Negative for facial asymmetry, weakness, light-headedness, numbness and headaches.  All other systems reviewed and are negative.    Allergies  Review of patient's allergies indicates no known allergies.  Home Medications   Current Outpatient Rx  Name  Route  Sig  Dispense  Refill  . alendronate (FOSAMAX) 70 MG tablet      TAKE 1 TABLET BY MOUTH EVERY 7 DAYS WITH A FULL GLASS OF WATER ON AN EMPTY STOMACH   12 tablet   3   . ALPRAZolam (XANAX) 0.5 MG tablet   Oral   Take 1 tablet (0.5 mg total) by mouth 2 (two) times daily as needed.   60 tablet   4   . fesoterodine (TOVIAZ) 4 MG TB24   Oral   Take 1 tablet (4 mg total) by mouth daily.   90 tablet   1   . hydrochlorothiazide (  HYDRODIURIL) 25 MG tablet      TAKE 1 TABLET BY MOUTH EVERY DAY   90 tablet   2   . simvastatin (ZOCOR) 40 MG tablet   Oral   Take 1 tablet (40 mg total) by mouth daily.   90 tablet   6     BP 149/78  Pulse 76  Temp(Src) 98.4 F (36.9 C)  Resp 16  Ht 5\' 7"  (1.702 m)  Wt 169 lb (76.658 kg)  BMI 26.46 kg/m2  SpO2 97%  Physical Exam  Nursing note and vitals reviewed. Constitutional: She is oriented to person, place, and time. She appears well-developed and well-nourished. No distress.  HENT:  Head: Normocephalic and atraumatic.  Right Ear: External ear normal.  Left Ear: External ear normal.  Nose: Nose normal.  Mouth/Throat: Oropharynx is clear and moist.  Oral mucosa dry  Eyes: Conjunctivae are normal. Pupils are equal, round, and reactive to light.  Neck: Neck supple.  Cardiovascular: Normal rate, regular rhythm and  normal heart sounds.   Pulmonary/Chest: Effort normal and breath sounds normal. No respiratory distress. She has no wheezes. She has no rales. She exhibits tenderness.  Bruising noted to the anterior sternum. Entire chest tender to palpation.  Abdominal: Soft. Bowel sounds are normal. She exhibits no distension. There is no tenderness. There is no rebound and no guarding.  Musculoskeletal: She exhibits no edema.  Neurological: She is alert and oriented to person, place, and time.  5/5 and equal upper and lower extremity strength bilaterally. Equal grip strength bilaterally. Normal finger to nose and heel to shin. No pronator drift.   Skin: Skin is warm and dry.  Psychiatric: She has a normal mood and affect. Her behavior is normal.    ED Course  Procedures (including critical care time)  Pt with syncopal episode, possible respiratory arrest? Here in NSR, will get ECG, labs, monitor. Pt alert and appropriate at this time.    Date: 04/02/2012  Rate: 75  Rhythm: normal sinus rhythm and premature ventricular contractions (PVC)  QRS Axis: normal  Intervals: normal  ST/T Wave abnormalities: normal  Conduction Disutrbances:left posterior fascicular block  Narrative Interpretation: incomplete RBBB  Old EKG Reviewed: unchanged   Results for orders placed during the hospital encounter of 04/02/12  CBC      Result Value Range   WBC 10.8 (*) 4.0 - 10.5 K/uL   RBC 4.31  3.87 - 5.11 MIL/uL   Hemoglobin 13.2  12.0 - 15.0 g/dL   HCT 14.7  82.9 - 56.2 %   MCV 91.2  78.0 - 100.0 fL   MCH 30.6  26.0 - 34.0 pg   MCHC 33.6  30.0 - 36.0 g/dL   RDW 13.0  86.5 - 78.4 %   Platelets 285  150 - 400 K/uL  COMPREHENSIVE METABOLIC PANEL      Result Value Range   Sodium 144  135 - 145 mEq/L   Potassium 4.5  3.5 - 5.1 mEq/L   Chloride 104  96 - 112 mEq/L   CO2 27  19 - 32 mEq/L   Glucose, Bld 109 (*) 70 - 99 mg/dL   BUN 29 (*) 6 - 23 mg/dL   Creatinine, Ser 6.96  0.50 - 1.10 mg/dL   Calcium 29.5  8.4  - 10.5 mg/dL   Total Protein 7.4  6.0 - 8.3 g/dL   Albumin 3.9  3.5 - 5.2 g/dL   AST 21  0 - 37 U/L   ALT 22  0 - 35 U/L   Alkaline Phosphatase 75  39 - 117 U/L   Total Bilirubin 0.5  0.3 - 1.2 mg/dL   GFR calc non Af Amer 53 (*) >90 mL/min   GFR calc Af Amer 62 (*) >90 mL/min  GLUCOSE, CAPILLARY      Result Value Range   Glucose-Capillary 94  70 - 99 mg/dL  POCT I-STAT TROPONIN I      Result Value Range   Troponin i, poc 0.00  0.00 - 0.08 ng/mL   Comment 3            Dg Chest 2 View  04/02/2012  *RADIOLOGY REPORT*  Clinical Data: Loss of consciousness.  CPR.  Sternal pain.  CHEST - 2 VIEW  Comparison: None.  Findings: Mild hyperinflation.  Moderate thoracic spondylosis.  Surgical clips in the right upper quadrant.  Remote right rib trauma. Midline trachea.  Normal heart size and mediastinal contours.  Mild right hemidiaphragm elevation. No pleural effusion or pneumothorax.  Mild bibasilar volume loss/atelectasis versus scar. Favor osseous summation shadow projecting over the medial left apex at 1.0 cm.  IMPRESSION: No acute or post-traumatic deformity identified.  Probable osseous summation shadow over the left lung apex.  Consider plain film follow-up at 3 - 6 months to confirm stability of this appearance.   Original Report Authenticated By: Jeronimo Greaves, M.D.    Dg Sternum  04/02/2012  *RADIOLOGY REPORT*  Clinical Data: Status post CPR with sternal pain.  STERNUM - 2+ VIEW  Comparison: Chest films same date  Findings: There is irregularity about the superior most aspect of the sternal body, in the region of the sternomanubrial joint.  The remainder of the sternum is without displaced fracture.  There is no retrosternal hematoma identified.  IMPRESSION: Osseous irregularity about the superior sternal body, favored to be degenerative and related to the adjacent sternomanubrial joint. Absence of the retrosternal hematoma argues against fracture. Correlate with point tenderness.   Original Report  Authenticated By: Jeronimo Greaves, M.D.    Ct Head Wo Contrast  04/02/2012  *RADIOLOGY REPORT*  Clinical Data: Loss of consciousness.  CT HEAD WITHOUT CONTRAST  Technique:  Contiguous axial images were obtained from the base of the skull through the vertex without contrast.  Comparison: None  Findings: Age appropriate atrophy.  Negative for hydrocephalus. Negative for acute infarct, mass, or hemorrhage.  Calvarium is intact.  Mild chronic sinusitis. Atherosclerotic disease.  IMPRESSION: No acute intracranial abnormality.   Original Report Authenticated By: Janeece Riggers, M.D.        1. Syncope   2. Other and unspecified hyperlipidemia   3. Unspecified essential hypertension       MDM  Pt post possible syncopal episode while eating today. Question seizure, no hx of the same, no urinary incontinence, no tongue injury. Pt was not post ictal. She is alert and oriented now, appropriate. She does not remember the episode. She did have CPR performed due to "not breathing," by bystanders, hr wast not checked. In ED, she has been in NSR, her initial work up here is unremarkable. She will be admitted for further evaluation to triad service.  Filed Vitals:   04/02/12 1114 04/02/12 1435 04/02/12 1436 04/02/12 1500  BP: 149/78 128/63 87/56 135/62  Pulse: 76 75 73 70  Temp: 98.4 F (36.9 C)     Resp: 16   21  Height: 5\' 7"  (1.702 m)     Weight: 169 lb (76.658 kg)     SpO2: 97%  99%            Lottie Mussel, PA-C 04/02/12 1526

## 2012-04-02 NOTE — ED Provider Notes (Signed)
Medical screening examination/treatment/procedure(s) were performed by non-physician practitioner and as supervising physician I was immediately available for consultation/collaboration.  Tabrina Esty R. Emile Kyllo, MD 04/02/12 1547 

## 2012-04-02 NOTE — ED Notes (Signed)
Pt. c/o syncopal episode. Pt was at restaurant, saw black spots. Pt remembers waking up on floor. Per EMS, bystanders initiated CPR and stopped prior to pt becoming responsive, EMS found no evidence of Cardiac arrest . Pt c/o of pain in chest where "they pounded on me".

## 2012-04-02 NOTE — Consult Note (Signed)
HPI: 79 year old female with no prior cardiac history for evaluation of syncope. Patient has not noticed recent dyspnea on exertion, orthopnea, PND, palpitations or exertional chest pain. She has had some fatigue. She has a history of syncope when she was young during her menstrual cycles. She also has had previous episodes of syncope while standing in line for long periods of time and standing at [place]. Approximately one month ago she had a bowel movement. She stood and began to feel dizzy. She felt nauseated and "hot". She then had a syncopal episode. No preceding chest pain, dyspnea or palpitations. She was unconscious for approximately 1 minute. Today while eating breakfast at a restaurant she developed sudden aura of seeing stars followed by frank syncope. No preceding dyspnea or chest pain. No palpitations. She did have nausea prior to her event. She was unconscious for approximately 30 seconds. During that time another customer perform CPR for approximately 5 seconds per her husband. They did not check her pulse. It is not clear that she loss pulse. Cardiology is now asked to evaluate. Occasional mild pedal edema.   (Not in a hospital admission)  No Known Allergies  Past Medical History  Diagnosis Date  . BREAST BIOPSY, HX OF 06/10/2006  . HIP PAIN, LEFT 01/31/2007  . HYPERLIPIDEMIA 06/10/2006  . HYPERTENSION 06/10/2006  . NEOPLASMS UNSPEC NATURE BONE SOFT TISSUE&SKIN 05/31/2008  . OSTEOPOROSIS 06/10/2006  . Impaired glucose tolerance   . Breast cyst     Past Surgical History  Procedure Laterality Date  . Appendectomy    . Abdominal hysterectomy    . Breast surgery      bx x2  . Cholecystectomy  1980    History   Social History  . Marital Status: Married    Spouse Name: N/A    Number of Children: 4  . Years of Education: N/A   Occupational History  . Not on file.   Social History Main Topics  . Smoking status: Never Smoker   . Smokeless tobacco: Current User    Types:  Snuff  . Alcohol Use: No  . Drug Use: No  . Sexually Active: Not on file   Other Topics Concern  . Not on file   Social History Narrative  . No narrative on file    Family History  Problem Relation Age of Onset  . Heart disease Mother     Pacemaker  . Heart disease Father   . Asthma Father   . Cancer Maternal Aunt     breast    ROS:  Patient complains of chest discomfort following CPR but no fevers or chills, productive cough, hemoptysis, dysphasia, odynophagia, melena, hematochezia, dysuria, hematuria, rash, seizure activity, orthopnea, PND, claudication. Remaining systems are negative.  Physical Exam:   Blood pressure 135/62, pulse 70, temperature 98.4 F (36.9 C), resp. rate 21, height 5\' 7"  (1.702 m), weight 169 lb (76.658 kg), SpO2 99.00%.  General:  Well developed/well nourished in NAD Skin warm/dry Patient not depressed No peripheral clubbing Back-normal HEENT-normal/normal eyelids Neck supple/normal carotid upstroke bilaterally; no bruits; no JVD; no thyromegaly chest - CTA/ normal expansion; chest and ribs are tender to palpation. CV - RRR/normal S1 and S2; no murmurs, rubs or gallops;  PMI nondisplaced Abdomen -NT/ND, no HSM, no mass, + bowel sounds, no bruit 2+ femoral pulses, no bruits Ext-no edema, chords, 2+ DP Neuro-grossly nonfocal  ECG sinus rhythm, PVC, first degree AV block, right bundle branch block.  Results for orders placed during the hospital  encounter of 04/02/12 (from the past 48 hour(s))  CBC     Status: Abnormal   Collection Time    04/02/12 11:48 AM      Result Value Range   WBC 10.8 (*) 4.0 - 10.5 K/uL   RBC 4.31  3.87 - 5.11 MIL/uL   Hemoglobin 13.2  12.0 - 15.0 g/dL   HCT 03.4  74.2 - 59.5 %   MCV 91.2  78.0 - 100.0 fL   MCH 30.6  26.0 - 34.0 pg   MCHC 33.6  30.0 - 36.0 g/dL   RDW 63.8  75.6 - 43.3 %   Platelets 285  150 - 400 K/uL  COMPREHENSIVE METABOLIC PANEL     Status: Abnormal   Collection Time    04/02/12 11:48 AM       Result Value Range   Sodium 144  135 - 145 mEq/L   Potassium 4.5  3.5 - 5.1 mEq/L   Chloride 104  96 - 112 mEq/L   CO2 27  19 - 32 mEq/L   Glucose, Bld 109 (*) 70 - 99 mg/dL   BUN 29 (*) 6 - 23 mg/dL   Creatinine, Ser 2.95  0.50 - 1.10 mg/dL   Calcium 18.8  8.4 - 41.6 mg/dL   Total Protein 7.4  6.0 - 8.3 g/dL   Albumin 3.9  3.5 - 5.2 g/dL   AST 21  0 - 37 U/L   ALT 22  0 - 35 U/L   Alkaline Phosphatase 75  39 - 117 U/L   Total Bilirubin 0.5  0.3 - 1.2 mg/dL   GFR calc non Af Amer 53 (*) >90 mL/min   GFR calc Af Amer 62 (*) >90 mL/min   Comment:            The eGFR has been calculated     using the CKD EPI equation.     This calculation has not been     validated in all clinical     situations.     eGFR's persistently     <90 mL/min signify     possible Chronic Kidney Disease.  POCT I-STAT TROPONIN I     Status: None   Collection Time    04/02/12 12:10 PM      Result Value Range   Troponin i, poc 0.00  0.00 - 0.08 ng/mL   Comment 3            Comment: Due to the release kinetics of cTnI,     a negative result within the first hours     of the onset of symptoms does not rule out     myocardial infarction with certainty.     If myocardial infarction is still suspected,     repeat the test at appropriate intervals.  GLUCOSE, CAPILLARY     Status: None   Collection Time    04/02/12  2:32 PM      Result Value Range   Glucose-Capillary 94  70 - 99 mg/dL    Dg Chest 2 View  6/0/6301  *RADIOLOGY REPORT*  Clinical Data: Loss of consciousness.  CPR.  Sternal pain.  CHEST - 2 VIEW  Comparison: None.  Findings: Mild hyperinflation.  Moderate thoracic spondylosis.  Surgical clips in the right upper quadrant.  Remote right rib trauma. Midline trachea.  Normal heart size and mediastinal contours.  Mild right hemidiaphragm elevation. No pleural effusion or pneumothorax.  Mild bibasilar volume loss/atelectasis versus scar. Favor osseous summation  shadow projecting over the medial left  apex at 1.0 cm.  IMPRESSION: No acute or post-traumatic deformity identified.  Probable osseous summation shadow over the left lung apex.  Consider plain film follow-up at 3 - 6 months to confirm stability of this appearance.   Original Report Authenticated By: Jeronimo Greaves, M.D.    Dg Sternum  04/02/2012  *RADIOLOGY REPORT*  Clinical Data: Status post CPR with sternal pain.  STERNUM - 2+ VIEW  Comparison: Chest films same date  Findings: There is irregularity about the superior most aspect of the sternal body, in the region of the sternomanubrial joint.  The remainder of the sternum is without displaced fracture.  There is no retrosternal hematoma identified.  IMPRESSION: Osseous irregularity about the superior sternal body, favored to be degenerative and related to the adjacent sternomanubrial joint. Absence of the retrosternal hematoma argues against fracture. Correlate with point tenderness.   Original Report Authenticated By: Jeronimo Greaves, M.D.    Ct Head Wo Contrast  04/02/2012  *RADIOLOGY REPORT*  Clinical Data: Loss of consciousness.  CT HEAD WITHOUT CONTRAST  Technique:  Contiguous axial images were obtained from the base of the skull through the vertex without contrast.  Comparison: None  Findings: Age appropriate atrophy.  Negative for hydrocephalus. Negative for acute infarct, mass, or hemorrhage.  Calvarium is intact.  Mild chronic sinusitis. Atherosclerotic disease.  IMPRESSION: No acute intracranial abnormality.   Original Report Authenticated By: Janeece Riggers, M.D.     Assessment/Plan 1 syncope-etiology unclear. She has a long history of syncopal episodes that sound to be autonomically mediated. Her in approximately one month ago sounds to be defecation syncope. Her event today however is more concerning for possible bradycardia mediated event. Her electrocardiogram shows conduction disease with a first degree AV block and right bundle branch block. Agree with admission to telemetry. We will  arrange echocardiogram to assess LV function. If no abnormality on telemetry and LV function normal she will need an outpatient event monitor. I have instructed her that she will not be able to drive until this workup is complete. I will also arrange outpatient functional study. 2 hypertension-she appears to be mildly prerenal based on her BUN and creatinine ratio. I would discontinue her HCTZ. Follow blood pressure and treat as needed. Avoid AV nodal blocking agents. 3 hyperlipidemia-continue statin.  Olga Millers MD 04/02/2012, 3:43 PM

## 2012-04-03 DIAGNOSIS — I369 Nonrheumatic tricuspid valve disorder, unspecified: Secondary | ICD-10-CM

## 2012-04-03 LAB — TROPONIN I: Troponin I: <0.3 ng/mL (ref <0.30)

## 2012-04-03 LAB — LIPID PANEL
LDL Cholesterol: 56 mg/dL (ref 0–99)
Total CHOL/HDL Ratio: 2.2 RATIO
VLDL: 13 mg/dL (ref 0–40)

## 2012-04-03 MED ORDER — SODIUM CHLORIDE 0.9 % IV SOLN
INTRAVENOUS | Status: DC
  Administered 2012-04-04: 09:00:00 via INTRAVENOUS

## 2012-04-03 NOTE — Progress Notes (Signed)
[facility]                                                                                Patient Demographics  Tammy [name], is a 79 y.o. female, DOB - Nov 06, 1931, WUJ:[id number], [id number]:[id number]  Admit date - 04/02/2012  Admitting Physician Clydia Llano, MD  Outpatient Primary MD for the patient is Rogelia Boga, MD  LOS - 1   Chief Complaint  Patient presents with  . Loss of Consciousness        Assessment & Plan   Summary  Tammy [name] is a 79 y.o. female with past medical history of hypertension hyperlipidemia, she came to the hospital because of syncope. Patient said she was in her usual state of health this morning when she went out to eat with her husband (they don't have power at home) she was sitting down and her husband said she started to look funny and he was asking her what is going on, she told him I am going to black out. She did lose her consciousness, and some of the bystanders started CPR on her. Patient regained her consciousness, brought to the hospital for further evaluation , she denies any chest pain, shortness of breath, palpitations prior to the incident. CT scan of the head is negative, her blood work is okay.   She has some chest wall pain likely from the CPR. 12-lead EKG showed questionable trifascicular block. In the hospital serial troponins negative, electrolytes stable, so far stable on telemetry, being seen by cardiology, due for echogram, if stable outpatient event monitor and cardiology followup.     1. Syncope and collapsed with loss of consciousness while she was sitting in a restaurant requiring CPR by bystanders. Suspicious for dysrhythmia, cardiology following the patient closely, serial troponins negative, await echo gram, per cardiology his echo stable will need outpatient event monitor, continue to monitor on telemetry. She also had orthostasis upon admission, post gentle IV fluids will monitor orthostatics.  Upon  admission carotid duplex was not ordered, will defer to cardiology to decide if any further workup is required.   2.HTN - due to orthostatics blood pressure medications have been held continue gentle IV fluids and monitor orthostasis.    3. History of dyslipidemia. Lipid panel stable outpatient workup.    Code Status: Full  Family Communication: Patient and family bedside  Disposition Plan: Home with event monitor   Procedures CT of the head, chest x-ray, echogram pending   Consults cardiology   DVT Prophylaxis    Heparin    Lab Results  Component Value Date   PLT 285 04/02/2012    Medications  Scheduled Meds: . aspirin EC  81 mg Oral Daily  . heparin  5,000 Units Subcutaneous Q8H  . sodium chloride  3 mL Intravenous Q12H   Continuous Infusions: . sodium chloride 75 mL/hr at 04/03/12 1316   PRN Meds:.acetaminophen, acetaminophen, ALPRAZolam, ondansetron (ZOFRAN) IV, ondansetron, oxyCODONE  Antibiotics    Anti-infectives   None       Time Spent in minutes   35   Susa Raring K M.D on 04/03/2012 at 2:02 PM  Between 7am to 7pm - Pager - [phone]  After 7pm go to [email or web address] - password [email or web address]  And look for the night coverage person covering for me after hours  [facility] Office  [phone]    Subjective:   [name] today has, No headache, mild post CPR right-sided chest wall pain, no further episodes of dizziness, No abdominal pain - No Nausea, No new weakness tingling or numbness, No Cough - SOB.    Objective:   Filed Vitals:   04/02/12 1436 04/02/12 1500 04/02/12 2143 04/03/12 0645  BP: 87/56 135/62 149/62 155/89  Pulse: 73 70 86 75  Temp:   98.6 F (37 C) 98.7 F (37.1 C)  TempSrc:   Oral Oral  Resp:  21 18 20   Height:      Weight:      SpO2:  99% 97% 97%    Wt Readings from Last 3 Encounters:  04/02/12 76.658 kg (169 lb)  12/14/11 75.751 kg (167 lb)  10/29/11 75.479 kg (166 lb 6.4 oz)     Intake/Output  Summary (Last 24 hours) at 04/03/12 1402 Last data filed at 04/03/12 1142  Gross per 24 hour  Intake    480 ml  Output      0 ml  Net    480 ml    Exam Awake Alert, Oriented X 3, No new F.N deficits, Normal affect [place].AT,PERRAL Supple Neck,No JVD, No cervical lymphadenopathy appriciated.  Symmetrical Chest wall movement, Good air movement bilaterally, CTAB RRR,No Gallops,Rubs or new Murmurs, No Parasternal Heave +ve B.Sounds, Abd Soft, Non tender, No organomegaly appriciated, No rebound - guarding or rigidity. No Cyanosis, Clubbing or edema, No new Rash or bruise     Data Review   Micro Results No results found for this or any previous visit (from the past 240 hour(s)).  Radiology Reports Dg Chest 2 View  04/02/2012  *RADIOLOGY REPORT*  Clinical Data: Loss of consciousness.  CPR.  Sternal pain.  CHEST - 2 VIEW  Comparison: None.  Findings: Mild hyperinflation.  Moderate thoracic spondylosis.  Surgical clips in the right upper quadrant.  Remote right rib trauma. Midline trachea.  Normal heart size and mediastinal contours.  Mild right hemidiaphragm elevation. No pleural effusion or pneumothorax.  Mild bibasilar volume loss/atelectasis versus scar. Favor osseous summation shadow projecting over the medial left apex at 1.0 cm.  IMPRESSION: No acute or post-traumatic deformity identified.  Probable osseous summation shadow over the left lung apex.  Consider plain film follow-up at 3 - 6 months to confirm stability of this appearance.   Original Report Authenticated By: Jeronimo Greaves, M.D.    Dg Sternum  04/02/2012  *RADIOLOGY REPORT*  Clinical Data: Status post CPR with sternal pain.  STERNUM - 2+ VIEW  Comparison: Chest films same date  Findings: There is irregularity about the superior most aspect of the sternal body, in the region of the sternomanubrial joint.  The remainder of the sternum is without displaced fracture.  There is no retrosternal hematoma identified.  IMPRESSION: Osseous  irregularity about the superior sternal body, favored to be degenerative and related to the adjacent sternomanubrial joint. Absence of the retrosternal hematoma argues against fracture. Correlate with point tenderness.   Original Report Authenticated By: Jeronimo Greaves, M.D.    Ct Head Wo Contrast  04/02/2012  *RADIOLOGY REPORT*  Clinical Data: Loss of consciousness.  CT HEAD WITHOUT CONTRAST  Technique:  Contiguous axial images were obtained from the base of the skull through the vertex without contrast.  Comparison: None  Findings: Age appropriate atrophy.  Negative for hydrocephalus. Negative for acute infarct, mass, or hemorrhage.  Calvarium is intact.  Mild chronic sinusitis. Atherosclerotic disease.  IMPRESSION: No acute intracranial abnormality.   Original Report Authenticated By: Janeece Riggers, M.D.     CBC  Recent Labs Lab 04/02/12 1148  WBC 10.8*  HGB 13.2  HCT 39.3  PLT 285  MCV 91.2  MCH 30.6  MCHC 33.6  RDW 14.4    Chemistries   Recent Labs Lab 04/02/12 1148  NA 144  K 4.5  CL 104  CO2 27  GLUCOSE 109*  BUN 29*  CREATININE 0.99  CALCIUM 10.1  AST 21  ALT 22  ALKPHOS 75  BILITOT 0.5   ------------------------------------------------------------------------------------------------------------------ estimated creatinine clearance is 50 ml/min (by C-G formula based on Cr of 0.99). ------------------------------------------------------------------------------------------------------------------ No results found for this basename: HGBA1C,  in the last 72 hours ------------------------------------------------------------------------------------------------------------------  Recent Labs  04/03/12 0612  CHOL 127  HDL 58  LDLCALC 56  TRIG 64  CHOLHDL 2.2   ------------------------------------------------------------------------------------------------------------------ No results found for this basename: TSH, T4TOTAL, FREET3, T3FREE, THYROIDAB,  in the last 72  hours ------------------------------------------------------------------------------------------------------------------ No results found for this basename: VITAMINB12, FOLATE, FERRITIN, TIBC, IRON, RETICCTPCT,  in the last 72 hours  Coagulation profile No results found for this basename: INR, PROTIME,  in the last 168 hours  No results found for this basename: DDIMER,  in the last 72 hours  Cardiac Enzymes  Recent Labs Lab 04/02/12 1620 04/02/12 2035 04/03/12 0612  TROPONINI <0.30 <0.30 <0.30   ------------------------------------------------------------------------------------------------------------------ No components found with this basename: POCBNP,

## 2012-04-03 NOTE — Progress Notes (Signed)
  Echocardiogram 2D Echocardiogram has been performed.  [name], Tammy FRANCES 04/03/2012, 4:55 PM

## 2012-04-03 NOTE — Progress Notes (Signed)
   Subjective:  Denies dizziness or dyspnea; chest "sore" from brief compressions yesterday.   Objective:  Filed Vitals:   04/02/12 1436 04/02/12 1500 04/02/12 2143 04/03/12 0645  BP: 87/56 135/62 149/62 155/89  Pulse: 73 70 86 75  Temp:   98.6 F (37 C) 98.7 F (37.1 C)  TempSrc:   Oral Oral  Resp:  21 18 20   Height:      Weight:      SpO2:  99% 97% 97%    Intake/Output from previous day: No intake or output data in the 24 hours ending 04/03/12 0901  Physical Exam: Physical exam: Well-developed well-nourished in no acute distress.  Skin is warm and dry.  HEENT is normal.  Neck is supple.  Chest is clear to auscultation with normal expansion. Tender to palpation over chest. Cardiovascular exam is regular rate and rhythm.  Abdominal exam nontender or distended. No masses palpated. Extremities show no edema. neuro grossly intact    Lab Results: Basic Metabolic Panel:  Recent Labs  16/10/96 1148  NA 144  K 4.5  CL 104  CO2 27  GLUCOSE 109*  BUN 29*  CREATININE 0.99  CALCIUM 10.1   CBC:  Recent Labs  04/02/12 1148  WBC 10.8*  HGB 13.2  HCT 39.3  MCV 91.2  PLT 285   Cardiac Enzymes:  Recent Labs  04/02/12 1620 04/02/12 2035 04/03/12 0612  TROPONINI <0.30 <0.30 <0.30     Assessment/Plan:  1 syncope-etiology remains unclear. Telemetry reviewed and patient noted tach sinus rhythm with PACs, PVCs and rare couplet. She does have conduction disease on electrocardiogram including first degree AV block and right bundle branch block. Question bradycardia mediated event. She apparently received brief CPR (3 compressions per husband) by a bystander but not clear if she lost pulse (no documentation). Her enzymes are negative. Plan continue telemetry today. Echocardiogram to assess LV function. If no arrhythmias noted and LV function normal would favor outpatient event monitor and functional study. 2 hypertension-continue off of HCTZ. Follow blood pressure at  home. 3 hyperlipidemia-continue statin.  Olga Millers 04/03/2012, 9:01 AM

## 2012-04-04 LAB — TSH: TSH: 7.115 u[IU]/mL — ABNORMAL HIGH (ref 0.350–4.500)

## 2012-04-04 NOTE — Discharge Summary (Signed)
Physician Discharge Summary  Tammy [name] ZOX:[id number] DOB: 1931/11/30 DOA: 04/02/2012  PCP: Rogelia Boga, MD  Admit date: 04/02/2012 Discharge date: 04/04/2012  Time spent: 30 minutes  Recommendations for Outpatient Follow-up:  Follow up with Dr. Ladona Ridgel 2-4 weeks.   Discharge Diagnoses:  Principal Problem:   Syncope Active Problems:   HYPERLIPIDEMIA   HYPERTENSION   OSTEOPOROSIS   Discharge Condition: stable  Diet recommendation: heart healthy diet  Filed Weights   04/02/12 1114 04/03/12 2159  Weight: 76.658 kg (169 lb) 77.474 kg (170 lb 12.8 oz)    History of present illness:  79 y.o. female with past medical history of hypertension hyperlipidemia, she came to the hospital because of syncope. Patient said she was in her usual state of health this morning when she went out to eat with her husband (they don't have power at home) she was sitting down and her husband said she started to look funny and he was asking her what is going on, she told him I am going to black out. She did lose her consciousness, and some of the bystanders started CPR on her. Patient regained her consciousness, brought to the hospital for further evaluation , she denies any chest pain, shortness of breath, palpitations prior to the incident. CT scan of the head is negative, her blood work is okay. She has some chest wall pain likely from the CPR. 12-lead EKG showed questionable trifascicular block.   Hospital Course:  Syncope: - serial troponins negative - echo 3.10.2014: ejection fraction was in the range of 60% to 65%. grade 1 diastolic dysfunction. No AS. - Ct head no acute intracranial abnormality. No events on telemetry. - cardiology consulted recommended outpatient monitor.   HTN: - resume Bp meds as an outpatient.  Procedures:  Echo 3.10.2014  Consultations:  cardiology  Discharge Exam: Filed Vitals:   04/04/12 0624 04/04/12 0945 04/04/12 0948 04/04/12 0952  BP: 137/74  151/67 157/81 154/89  Pulse: 88 84 87 98  Temp:  98.8 F (37.1 C)    TempSrc:  Oral    Resp: 18 18    Height:      Weight:      SpO2: 99% 99%      General: A&O x3 Cardiovascular: RRR Respiratory: good air movement  Discharge Instructions  Discharge Orders   Future Appointments Naara Kelty Department Dept Phone   05/18/2012 10:30 AM Marinus Maw, MD [facility] [place]) [phone]   06/13/2012 9:30 AM Gordy Savers, MD [facility] [place] [phone]   Future Orders Complete By Expires     Diet - low sodium heart healthy  As directed     Increase activity slowly  As directed         Medication List    TAKE these medications       alendronate 70 MG tablet  Commonly known as:  FOSAMAX  Take 70 mg by mouth every 7 (seven) days. Take with a full glass of water on an empty stomach. Take on Wednesday.     ALPRAZolam 0.5 MG tablet  Commonly known as:  XANAX  Take 0.5 mg by mouth 2 (two) times daily as needed. For anxiety     hydrochlorothiazide 25 MG tablet  Commonly known as:  HYDRODIURIL  Take 25 mg by mouth daily.     simvastatin 40 MG tablet  Commonly known as:  ZOCOR  Take 40 mg by mouth at bedtime.  Follow-up Information   Follow up with [facility] Nurse. (Our office will call you this week with your appointment date and time to pick up 30-day heart monitor)    Contact information:   [address] [place] [address] [phone]      Follow up with Lewayne Bunting, MD On 05/18/2012. (At 10:30 AM for hospital follow-up)    Contact information:   [address] [place] [address] [phone]       The results of significant diagnostics from this hospitalization (including imaging, microbiology, ancillary and laboratory) are listed below for reference.    Significant Diagnostic Studies: Dg Chest 2 View  04/02/2012  *RADIOLOGY REPORT*  Clinical Data: Loss of  consciousness.  CPR.  Sternal pain.  CHEST - 2 VIEW  Comparison: None.  Findings: Mild hyperinflation.  Moderate thoracic spondylosis.  Surgical clips in the right upper quadrant.  Remote right rib trauma. Midline trachea.  Normal heart size and mediastinal contours.  Mild right hemidiaphragm elevation. No pleural effusion or pneumothorax.  Mild bibasilar volume loss/atelectasis versus scar. Favor osseous summation shadow projecting over the medial left apex at 1.0 cm.  IMPRESSION: No acute or post-traumatic deformity identified.  Probable osseous summation shadow over the left lung apex.  Consider plain film follow-up at 3 - 6 months to confirm stability of this appearance.   Original Report Authenticated By: Jeronimo Greaves, M.D.    Dg Sternum  04/02/2012  *RADIOLOGY REPORT*  Clinical Data: Status post CPR with sternal pain.  STERNUM - 2+ VIEW  Comparison: Chest films same date  Findings: There is irregularity about the superior most aspect of the sternal body, in the region of the sternomanubrial joint.  The remainder of the sternum is without displaced fracture.  There is no retrosternal hematoma identified.  IMPRESSION: Osseous irregularity about the superior sternal body, favored to be degenerative and related to the adjacent sternomanubrial joint. Absence of the retrosternal hematoma argues against fracture. Correlate with point tenderness.   Original Report Authenticated By: Jeronimo Greaves, M.D.    Ct Head Wo Contrast  04/02/2012  *RADIOLOGY REPORT*  Clinical Data: Loss of consciousness.  CT HEAD WITHOUT CONTRAST  Technique:  Contiguous axial images were obtained from the base of the skull through the vertex without contrast.  Comparison: None  Findings: Age appropriate atrophy.  Negative for hydrocephalus. Negative for acute infarct, mass, or hemorrhage.  Calvarium is intact.  Mild chronic sinusitis. Atherosclerotic disease.  IMPRESSION: No acute intracranial abnormality.   Original Report Authenticated By:  Janeece Riggers, M.D.     Microbiology: No results found for this or any previous visit (from the past 240 hour(s)).   Labs: Basic Metabolic Panel:  Recent Labs Lab 04/02/12 1148  NA 144  K 4.5  CL 104  CO2 27  GLUCOSE 109*  BUN 29*  CREATININE 0.99  CALCIUM 10.1   Liver Function Tests:  Recent Labs Lab 04/02/12 1148  AST 21  ALT 22  ALKPHOS 75  BILITOT 0.5  PROT 7.4  ALBUMIN 3.9   No results found for this basename: LIPASE, AMYLASE,  in the last 168 hours No results found for this basename: AMMONIA,  in the last 168 hours CBC:  Recent Labs Lab 04/02/12 1148  WBC 10.8*  HGB 13.2  HCT 39.3  MCV 91.2  PLT 285   Cardiac Enzymes:  Recent Labs Lab 04/02/12 1620 04/02/12 2035 04/03/12 0612  TROPONINI <0.30 <0.30 <0.30   BNP: BNP (last 3  results) No results found for this basename: PROBNP,  in the last 8760 hours CBG:  Recent Labs Lab 04/02/12 1432  GLUCAP 94    Signed:  FELIZ ORTIZ, ABRAHAM  [facility] 04/04/2012, 12:37 PM

## 2012-04-04 NOTE — Progress Notes (Signed)
Patient ID: Tammy [name], female   DOB: 1931-09-24, 79 y.o.   MRN: [id number] Subjective:  Minimal chest soreness  Objective:  Vital Signs in the last 24 hours: Temp:  [98.6 F (37 C)-99.1 F (37.3 C)] 99.1 F (37.3 C) (03/10 0616) Pulse Rate:  [78-90] 88 (03/10 0624) Resp:  [18-20] 18 (03/10 0624) BP: (135-161)/(55-84) 137/74 mmHg (03/10 0624) SpO2:  [95 %-99 %] 99 % (03/10 0624) Weight:  [170 lb 12.8 oz (77.474 kg)] 170 lb 12.8 oz (77.474 kg) (03/09 2159)  Intake/Output from previous day: 03/09 0701 - 03/10 0700 In: 2116.7 [P.O.:720; I.V.:1396.7] Out: 3 [Urine:3] Intake/Output from this shift:    Physical Exam: Well appearing elderly woman, NAD HEENT: Unremarkable Neck:  7 cm JVD, no thyromegally Lymphatics:  No adenopathy Back:  No CVA tenderness Lungs:  Clear with no rales or rhonchi HEART:  Regular rate rhythm, no murmurs, no rubs, no clicks Abd:  Flat, positive bowel sounds, no organomegally, no rebound, no guarding Ext:  2 plus pulses, no edema, no cyanosis, no clubbing Skin:  No rashes no nodules Neuro:  CN II through XII intact, motor grossly intact  Lab Results:  Recent Labs  04/02/12 1148  WBC 10.8*  HGB 13.2  PLT 285    Recent Labs  04/02/12 1148  NA 144  K 4.5  CL 104  CO2 27  GLUCOSE 109*  BUN 29*  CREATININE 0.99    Recent Labs  04/02/12 2035 04/03/12 0612  TROPONINI <0.30 <0.30   Hepatic Function Panel  Recent Labs  04/02/12 1148  PROT 7.4  ALBUMIN 3.9  AST 21  ALT 22  ALKPHOS 75  BILITOT 0.5    Recent Labs  04/03/12 0612  CHOL 127   No results found for this basename: PROTIME,  in the last 72 hours  Imaging: Dg Chest 2 View  04/02/2012  *RADIOLOGY REPORT*  Clinical Data: Loss of consciousness.  CPR.  Sternal pain.  CHEST - 2 VIEW  Comparison: None.  Findings: Mild hyperinflation.  Moderate thoracic spondylosis.  Surgical clips in the right upper quadrant.  Remote right rib trauma. Midline trachea.  Normal heart size and  mediastinal contours.  Mild right hemidiaphragm elevation. No pleural effusion or pneumothorax.  Mild bibasilar volume loss/atelectasis versus scar. Favor osseous summation shadow projecting over the medial left apex at 1.0 cm.  IMPRESSION: No acute or post-traumatic deformity identified.  Probable osseous summation shadow over the left lung apex.  Consider plain film follow-up at 3 - 6 months to confirm stability of this appearance.   Original Report Authenticated By: Jeronimo Greaves, M.D.    Dg Sternum  04/02/2012  *RADIOLOGY REPORT*  Clinical Data: Status post CPR with sternal pain.  STERNUM - 2+ VIEW  Comparison: Chest films same date  Findings: There is irregularity about the superior most aspect of the sternal body, in the region of the sternomanubrial joint.  The remainder of the sternum is without displaced fracture.  There is no retrosternal hematoma identified.  IMPRESSION: Osseous irregularity about the superior sternal body, favored to be degenerative and related to the adjacent sternomanubrial joint. Absence of the retrosternal hematoma argues against fracture. Correlate with point tenderness.   Original Report Authenticated By: Jeronimo Greaves, M.D.    Ct Head Wo Contrast  04/02/2012  *RADIOLOGY REPORT*  Clinical Data: Loss of consciousness.  CT HEAD WITHOUT CONTRAST  Technique:  Contiguous axial images were obtained from the base of the skull through the vertex without contrast.  Comparison:  None  Findings: Age appropriate atrophy.  Negative for hydrocephalus. Negative for acute infarct, mass, or hemorrhage.  Calvarium is intact.  Mild chronic sinusitis. Atherosclerotic disease.  IMPRESSION: No acute intracranial abnormality.   Original Report Authenticated By: Janeece Riggers, M.D.     Cardiac Studies: Tele - nsr Assessment/Plan:  1. Unexplained syncope - no arrhythmias on tele. Ok to discharge if 2D echo demonstrates preserved LV function. She will need an outpatient monitor 4 week, symptom limited,  and followup with me. Avoid AV nodal blocking drugs.  LOS: 2 days    Gregg Taylor,M.D. 04/04/2012, 8:35 AM

## 2012-04-04 NOTE — Progress Notes (Signed)
Utilization review completed.  

## 2012-04-04 NOTE — Progress Notes (Signed)
Pt discharged to home after visit summary reviewed and pt capable of re verbalizing medications and follow up appointments. Pt remains stable. No signs and symptoms of distress. Educated to return to ER in the event of SOB, dizziness, chest pain, or fainting. TATJANA HICKS, RN   

## 2012-04-08 ENCOUNTER — Other Ambulatory Visit: Payer: Self-pay | Admitting: *Deleted

## 2012-04-08 DIAGNOSIS — R55 Syncope and collapse: Secondary | ICD-10-CM

## 2012-04-14 ENCOUNTER — Telehealth: Payer: Self-pay | Admitting: *Deleted

## 2012-04-14 ENCOUNTER — Encounter (INDEPENDENT_AMBULATORY_CARE_PROVIDER_SITE_OTHER): Payer: Medicare PPO

## 2012-04-14 DIAGNOSIS — R55 Syncope and collapse: Secondary | ICD-10-CM

## 2012-04-14 NOTE — Telephone Encounter (Signed)
30 day event monitor placed on Pt 04/14/12 TK

## 2012-04-15 ENCOUNTER — Other Ambulatory Visit: Payer: Self-pay | Admitting: Internal Medicine

## 2012-05-18 ENCOUNTER — Ambulatory Visit (INDEPENDENT_AMBULATORY_CARE_PROVIDER_SITE_OTHER): Payer: Medicare PPO | Admitting: Internal Medicine

## 2012-05-18 ENCOUNTER — Encounter: Payer: Self-pay | Admitting: Internal Medicine

## 2012-05-18 VITALS — BP 132/90 | HR 76 | Ht 67.0 in | Wt 165.4 lb

## 2012-05-18 DIAGNOSIS — R55 Syncope and collapse: Secondary | ICD-10-CM

## 2012-05-18 NOTE — Assessment & Plan Note (Signed)
This is most likely related to autonomic dysfunction. We discussed options for treatment. Insertion of an implantable loop recorder was discussed. I offered her the possibility of loop insertion. We also discussed a conservative approach with watchful waiting. After motor flexion, she wishes to undergo watchful waiting. If she has recurrent syncope, she is not need to go to the hospital unless she injured herself. I have asked the patient to call our office and notify us. If she passes out again, and the mechanism is not understood, I would recommend insertion of an implantable loop recorder.

## 2012-05-18 NOTE — Progress Notes (Signed)
HPI Tammy [name] returns today for followup. She was hospitalized with syncope, thought due to autonomic dysfunction, all in the setting of some underlying conduction system disease with RBBB. She has done well since her hospitalization. She has had no recurrent syncope. She denies chest pain or shortness of breath. No peripheral edema. No Known Allergies   Current Outpatient Prescriptions  Medication Sig Dispense Refill  . alendronate (FOSAMAX) 70 MG tablet TAKE 1 TABLET BY MOUTH EVERY 7 DAYS WITH A FULL GLASS OF WATER ON AN EMPTY STOMACH  12 tablet  3  . ALPRAZolam (XANAX) 0.5 MG tablet TAKE 1 TABLET BY MOUTH TWICE A DAY AS NEEDED  60 tablet  3  . hydrochlorothiazide (HYDRODIURIL) 25 MG tablet Take 25 mg by mouth daily.      . simvastatin (ZOCOR) 40 MG tablet Take 40 mg by mouth at bedtime.       No current facility-administered medications for this visit.     Past Medical History  Diagnosis Date  . BREAST BIOPSY, HX OF 06/10/2006  . HIP PAIN, LEFT 01/31/2007  . HYPERLIPIDEMIA 06/10/2006  . HYPERTENSION 06/10/2006  . NEOPLASMS UNSPEC NATURE BONE SOFT TISSUE&SKIN 05/31/2008  . OSTEOPOROSIS 06/10/2006  . Impaired glucose tolerance   . Breast cyst     ROS:   All systems reviewed and negative except as noted in the HPI.   Past Surgical History  Procedure Laterality Date  . Appendectomy    . Abdominal hysterectomy    . Breast surgery      bx x2  . Cholecystectomy  1980     Family History  Problem Relation Age of Onset  . Heart disease Mother     Pacemaker  . Heart disease Father   . Asthma Father   . Cancer Maternal Aunt     breast     History   Social History  . Marital Status: Married    Spouse Name: N/A    Number of Children: 4  . Years of Education: N/A   Occupational History  . Not on file.   Social History Main Topics  . Smoking status: Never Smoker   . Smokeless tobacco: Current User    Types: Snuff  . Alcohol Use: No  . Drug Use: No  . Sexually Active:  Yes   Other Topics Concern  . Not on file   Social History Narrative  . No narrative on file     BP 132/90  Pulse 76  Ht 5\' 7"  (1.702 m)  Wt 165 lb 6.4 oz (75.025 kg)  BMI 25.9 kg/m2  Physical Exam:  Well appearing 79 year old woman,NAD HEENT: Unremarkable Neck:  No JVD, no thyromegally Back:  No CVA tenderness Lungs:  Clear with no wheezes, rales, or rhonchi. HEART:  Regular rate rhythm, no murmurs, no rubs, no clicks Abd:  soft, positive bowel sounds, no organomegally, no rebound, no guarding Ext:  2 plus pulses, no edema, no cyanosis, no clubbing Skin:  No rashes no nodules Neuro:  CN II through XII intact, motor grossly intact   Assess/Plan:

## 2012-05-18 NOTE — Patient Instructions (Addendum)
Your physician recommends that you schedule a follow-up appointment in: 4 months with Dr Ladona Ridgel  Your physician recommends that you continue on your current medications as directed. Please refer to the Current Medication list given to you today.

## 2012-06-13 ENCOUNTER — Encounter: Payer: Self-pay | Admitting: Internal Medicine

## 2012-06-13 ENCOUNTER — Ambulatory Visit (INDEPENDENT_AMBULATORY_CARE_PROVIDER_SITE_OTHER): Payer: Medicare PPO | Admitting: Internal Medicine

## 2012-06-13 VITALS — BP 130/80 | HR 84 | Temp 98.3°F | Resp 20 | Ht 66.5 in | Wt 166.0 lb

## 2012-06-13 DIAGNOSIS — M810 Age-related osteoporosis without current pathological fracture: Secondary | ICD-10-CM

## 2012-06-13 DIAGNOSIS — E785 Hyperlipidemia, unspecified: Secondary | ICD-10-CM

## 2012-06-13 DIAGNOSIS — I10 Essential (primary) hypertension: Secondary | ICD-10-CM

## 2012-06-13 DIAGNOSIS — Z0000 Encounter for general adult medical examination without abnormal findings: Secondary | ICD-10-CM

## 2012-06-13 DIAGNOSIS — R300 Dysuria: Secondary | ICD-10-CM

## 2012-06-13 DIAGNOSIS — R55 Syncope and collapse: Secondary | ICD-10-CM

## 2012-06-13 LAB — POCT URINALYSIS DIPSTICK
Bilirubin, UA: NEGATIVE
Glucose, UA: NEGATIVE
Ketones, UA: NEGATIVE
Nitrite, UA: POSITIVE

## 2012-06-13 MED ORDER — ALPRAZOLAM 0.5 MG PO TABS: ORAL_TABLET | ORAL | Status: DC

## 2012-06-13 MED ORDER — HYDROCHLOROTHIAZIDE 25 MG PO TABS: 25.0000 mg | ORAL_TABLET | Freq: Every day | ORAL | Status: DC

## 2012-06-13 MED ORDER — ALENDRONATE SODIUM 70 MG PO TABS: ORAL_TABLET | ORAL | Status: DC

## 2012-06-13 MED ORDER — CIPROFLOXACIN HCL 500 MG PO TABS: 500.0000 mg | ORAL_TABLET | Freq: Two times a day (BID) | ORAL | Status: DC

## 2012-06-13 MED ORDER — SIMVASTATIN 40 MG PO TABS: 40.0000 mg | ORAL_TABLET | Freq: Every day | ORAL | Status: DC

## 2012-06-13 NOTE — Patient Instructions (Signed)
Limit your sodium (Salt) intake    It is important that you exercise regularly, at least 20 minutes 3 to 4 times per week.  If you develop chest pain or shortness of breath seek  medical attention.  Take a calcium supplement, plus 800-1200 units of vitamin D  Return in 6 months for follow-up  

## 2012-06-13 NOTE — Progress Notes (Signed)
Patient ID: CHIRSTINA [name], female   DOB: 01-03-1932, 79 y.o.   MRN: [id number] Patient ID: Tammy [name], female   DOB: 1931-09-16, 79 y.o.   MRN: [id number]  Subjective:    Patient ID: Tammy [name], female    DOB: 12-23-1931, 79 y.o.   MRN: [id number]  Hypertension Pertinent negatives include no chest pain, headaches, palpitations or shortness of breath.  Hyperlipidemia Pertinent negatives include no chest pain or shortness of breath.  79 year-old patient who is seen today for a annual exam. She has done quite well without concerns or complaints. She has treated hypertension and dyslipidemia. Denies any cardiopulmonary complaints. Her only complaints today are urinary frequency. She also has nocturia several times through the night and a sense of urgency. A fasting blood sugar this morning 93. She does have a history of mild impaired glucose tolerance. No dysuria. She has been hospitalized recently for syncope with a negative evaluation. This has been felt to be neurocardiogenic. No recurrent syncope. Presently is abstaining from driving  1. Risk factors, based on past  M,S,F history- cardiovascular risk factors include age hypertension and dyslipidemia 2.  Physical activities: Fairly active and independent but no rigorous exercise program  3.  Depression/mood: No history depression or mood disorder  4.  Hearing: No major deficits  5.  ADL's: Remains independent in all aspects of daily living  6.  Fall risk: Moderate due to age  71.  Home safety: No problems identified  8.  Height weight, and visual acuity; height and weight fairly stable. There has been a voluntary 8 pound weight loss no change in visual acuity  9.  Counseling: Heart healthy diet more regular exercise are encouraged 10. Lab orders based on risk factors: Laboratory profile including lipid panel will be reviewed  11. Referral : Not appropriate at this time  12. Care plan: Low-salt heart healthy diet encouraged we'll continue  calcium and vitamin D supplements as well as low-dose aspirin  13. Cognitive assessment: Alert and oriented with normal affect. No cognitive dysfunction      Preventive Screening-Counseling & Management  Alcohol-Tobacco  Smoking Status: never   Allergies (verified):  No Known Drug Allergies   Past History:  Past Medical History:  Reviewed history from 06/10/2006 and no changes required.  Hyperlipidemia  Hypertension  Osteoporosis  Recurrent syncope  Past Surgical History:  Reviewed history from 02/04/2009 and no changes required.  Appendectomy  Cholecystectomy  Hysterectomy  gravida 4, para 4, abortus zero  breast biopsies x 2 in 1985, 1998  colonoscopy- none (declines)   Family History:  Reviewed history from 01/19/2007 and no changes required.  father died at age 54, history of asthma, CAD  mother died age 33 of CAD  One brother died of accidental death  two half sisters, positive CAD  one sister is well   Social History:  Reviewed history from 01/19/2007 and no changes required.  9 grandchildren and 9 great grandchildren     Review of Systems  Constitutional: Negative for fever, appetite change, fatigue and unexpected weight change.  HENT: Negative for hearing loss, ear pain, nosebleeds, congestion, sore throat, mouth sores, trouble swallowing, neck stiffness, dental problem, voice change, sinus pressure and tinnitus.   Eyes: Negative for photophobia, pain, redness and visual disturbance.  Respiratory: Negative for cough, chest tightness and shortness of breath.   Cardiovascular: Negative for chest pain, palpitations and leg swelling.  Gastrointestinal: Negative for nausea, vomiting, abdominal pain, diarrhea, constipation, blood in stool, abdominal  distention and rectal pain.  Genitourinary: Negative for dysuria, urgency, frequency, hematuria, flank pain, vaginal bleeding, vaginal discharge, difficulty urinating, genital sores, vaginal pain, menstrual problem  and pelvic pain.  Musculoskeletal: Negative for back pain and arthralgias.  Skin: Negative for rash.  Neurological: Negative for dizziness, syncope, speech difficulty, weakness, light-headedness, numbness and headaches.  Hematological: Negative for adenopathy. Does not bruise/bleed easily.  Psychiatric/Behavioral: Negative for suicidal ideas, behavioral problems, self-injury, dysphoric mood and agitation. The patient is not nervous/anxious.         Objective:   Physical Exam  Constitutional: She is oriented to person, place, and time. She appears well-developed and well-nourished.  HENT:  Head: Normocephalic and atraumatic.  Right Ear: External ear normal.  Left Ear: External ear normal.  Mouth/Throat: Oropharynx is clear and moist.  Eyes: Conjunctivae and EOM are normal.  Neck: Normal range of motion. Neck supple. No JVD present. No thyromegaly present.  Cardiovascular: Normal rate, regular rhythm, normal heart sounds and intact distal pulses.   No murmur heard. Dorsalis pedis pulses were full posterior tibial pulses are not easily palpable. Distal feet were cool and slightly cyanotic  Pulmonary/Chest: Effort normal and breath sounds normal. She has no wheezes. She has no rales.  Abdominal: Soft. Bowel sounds are normal. She exhibits no distension and no mass. There is no tenderness. There is no rebound and no guarding.  Genitourinary: Vagina normal. Guaiac negative stool. No vaginal discharge found.  Cystocele  Musculoskeletal: Normal range of motion. She exhibits no edema and no tenderness.  Neurological: She is alert and oriented to person, place, and time. She has normal reflexes. No cranial nerve deficit. She exhibits normal muscle tone. Coordination normal.  Skin: Skin is warm and dry. No rash noted.  Psychiatric: She has a normal mood and affect. Her behavior is normal.          Assessment & Plan:   Unremarkable health maintenance examination Hypertension  stable Hypercholesterolemia. We'll check a lipid panel Osteoporosis. Continue Fosamax calcium and vitamin D Probable overactive bladder. We'll check a UA if this is normal and free of infection will have a trial of an anticholinergic  Recheck 6 months

## 2012-06-16 ENCOUNTER — Other Ambulatory Visit: Payer: Self-pay | Admitting: Internal Medicine

## 2012-09-20 ENCOUNTER — Encounter: Payer: Self-pay | Admitting: Internal Medicine

## 2012-09-20 ENCOUNTER — Ambulatory Visit (INDEPENDENT_AMBULATORY_CARE_PROVIDER_SITE_OTHER): Payer: Medicare PPO | Admitting: Internal Medicine

## 2012-09-20 VITALS — BP 142/75 | HR 71 | Ht 67.0 in | Wt 172.4 lb

## 2012-09-20 DIAGNOSIS — R55 Syncope and collapse: Secondary | ICD-10-CM

## 2012-09-20 NOTE — Progress Notes (Signed)
HPI [name] returns today for followup. She is a 79 year old woman with a history of hypertension and dyslipidemia, who experienced a syncopal episode back in March. She has had no recurrent spells of syncope. She did note oine dizzy spell associated with near syncope. She were cardiac monitor which demonstrated PVCs and PACs but no sustained bradycardia or tachycardia arrhythmias. She denies chest pain or shortness of breath. She has minimal peripheral edema.  No Known Allergies   Current Outpatient Prescriptions  Medication Sig Dispense Refill  . alendronate (FOSAMAX) 70 MG tablet TAKE 1 TABLET BY MOUTH EVERY 7 DAYS WITH A FULL GLASS OF WATER ON AN EMPTY STOMACH  12 tablet  3  . ALPRAZolam (XANAX) 0.5 MG tablet TAKE 1 TABLET BY MOUTH TWICE A DAY AS NEEDED  60 tablet  3  . hydrochlorothiazide (HYDRODIURIL) 25 MG tablet Take 1 tablet (25 mg total) by mouth daily.  90 tablet  6  . simvastatin (ZOCOR) 40 MG tablet Take 1 tablet (40 mg total) by mouth at bedtime.  90 tablet  4   No current facility-administered medications for this visit.     Past Medical History  Diagnosis Date  . BREAST BIOPSY, HX OF 06/10/2006  . HIP PAIN, LEFT 01/31/2007  . HYPERLIPIDEMIA 06/10/2006  . HYPERTENSION 06/10/2006  . NEOPLASMS UNSPEC NATURE BONE SOFT TISSUE&SKIN 05/31/2008  . OSTEOPOROSIS 06/10/2006  . Impaired glucose tolerance   . Breast cyst     ROS:   All systems reviewed and negative except as noted in the HPI.   Past Surgical History  Procedure Laterality Date  . Appendectomy    . Abdominal hysterectomy    . Breast surgery      bx x2  . Cholecystectomy  1980     Family History  Problem Relation Age of Onset  . Heart disease Mother     Pacemaker  . Heart disease Father   . Asthma Father   . Cancer Maternal Aunt     breast     History   Social History  . Marital Status: Married    Spouse Name: N/A    Number of Children: 4  . Years of Education: N/A   Occupational History  .  Not on file.   Social History Main Topics  . Smoking status: Never Smoker   . Smokeless tobacco: Current User    Types: Snuff  . Alcohol Use: No  . Drug Use: No  . Sexual Activity: Yes   Other Topics Concern  . Not on file   Social History Narrative  . No narrative on file     BP 142/75  Pulse 71  Ht 5\' 7"  (1.702 m)  Wt 172 lb 6.4 oz (78.2 kg)  BMI 27 kg/m2  Physical Exam:  Well appearing elderly woman, NAD HEENT: Unremarkable Neck:  7 cm JVD, no thyromegally Lungs:  Clear with no wheezes, rales, or rhonchi. HEART:  Regular rate rhythm, no murmurs, no rubs, no clicks Abd:  soft, positive bowel sounds, no organomegally, no rebound, no guarding Ext:  2 plus pulses, no edema, no cyanosis, no clubbing Skin:  No rashes no nodules Neuro:  CN II through XII intact, motor grossly intact  Cardiac monitor - to three-week cardiac monitor was worn which demonstrated no sustained arrhythmias. She had frequent PVCs and PACs.    Assess/Plan:

## 2012-09-20 NOTE — Patient Instructions (Addendum)
Your physician recommends that you schedule a follow-up appointment in: AS NEEDED  MAY RETURN TO DRIVING 08-26-17

## 2012-09-20 NOTE — Assessment & Plan Note (Signed)
She has had a single isolated episode of unexplained syncope. She is allowed to return to driving 6 months after the episode. We discussed treatment options going forward. If she has recurrent syncope, insertion of an implantable loop recorder is warranted. I will see her back on an as-needed basis.

## 2012-12-07 ENCOUNTER — Other Ambulatory Visit: Payer: Self-pay | Admitting: Internal Medicine

## 2012-12-12 ENCOUNTER — Encounter: Payer: Self-pay | Admitting: Internal Medicine

## 2012-12-13 ENCOUNTER — Encounter: Payer: Self-pay | Admitting: Internal Medicine

## 2012-12-13 ENCOUNTER — Ambulatory Visit (INDEPENDENT_AMBULATORY_CARE_PROVIDER_SITE_OTHER): Payer: Medicare PPO | Admitting: Internal Medicine

## 2012-12-13 VITALS — BP 122/74 | HR 68 | Temp 97.8°F | Resp 20 | Wt 174.0 lb

## 2012-12-13 DIAGNOSIS — R300 Dysuria: Secondary | ICD-10-CM

## 2012-12-13 DIAGNOSIS — E785 Hyperlipidemia, unspecified: Secondary | ICD-10-CM

## 2012-12-13 DIAGNOSIS — R7989 Other specified abnormal findings of blood chemistry: Secondary | ICD-10-CM

## 2012-12-13 DIAGNOSIS — R5381 Other malaise: Secondary | ICD-10-CM

## 2012-12-13 DIAGNOSIS — I10 Essential (primary) hypertension: Secondary | ICD-10-CM

## 2012-12-13 DIAGNOSIS — R946 Abnormal results of thyroid function studies: Secondary | ICD-10-CM

## 2012-12-13 DIAGNOSIS — Z23 Encounter for immunization: Secondary | ICD-10-CM

## 2012-12-13 LAB — TSH: TSH: 3.23 u[IU]/mL (ref 0.35–5.50)

## 2012-12-13 LAB — COMPREHENSIVE METABOLIC PANEL
ALT: 16 U/L (ref 0–35)
AST: 24 U/L (ref 0–37)
Albumin: 4.1 g/dL (ref 3.5–5.2)
BUN: 32 mg/dL — ABNORMAL HIGH (ref 6–23)
CO2: 25 mEq/L (ref 19–32)
Calcium: 9.8 mg/dL (ref 8.4–10.5)
Chloride: 104 mEq/L (ref 96–112)
Creatinine, Ser: 1.2 mg/dL (ref 0.4–1.2)
GFR: 44.72 mL/min — ABNORMAL LOW (ref >60.00)
Glucose, Bld: 102 mg/dL — ABNORMAL HIGH (ref 70–99)
Potassium: 4.3 mEq/L (ref 3.5–5.1)
Sodium: 139 mEq/L (ref 135–145)
Total Protein: 7.3 g/dL (ref 6.0–8.3)

## 2012-12-13 LAB — CBC WITH DIFFERENTIAL/PLATELET
Basophils Absolute: 0 10*3/uL (ref 0.0–0.1)
Eosinophils Absolute: 0.1 10*3/uL (ref 0.0–0.7)
Lymphocytes Relative: 37.7 % (ref 12.0–46.0)
MCHC: 33.6 g/dL (ref 30.0–36.0)
Monocytes Absolute: 0.7 10*3/uL (ref 0.1–1.0)
Monocytes Relative: 11.1 % (ref 3.0–12.0)
Neutrophils Relative %: 49.4 % (ref 43.0–77.0)
RBC: 4.21 Mil/uL (ref 3.87–5.11)
RDW: 14.1 % (ref 11.5–14.6)

## 2012-12-13 MED ORDER — CIPROFLOXACIN HCL 500 MG PO TABS: 500.0000 mg | ORAL_TABLET | Freq: Two times a day (BID) | ORAL | Status: DC

## 2012-12-13 NOTE — Patient Instructions (Signed)
Take your antibiotic as prescribed until ALL of it is gone, but stop if you develop a rash, swelling, or any side effects of the medication.  Contact our office as soon as possible if  there are side effects of the medication.  Return in 3 months for follow-up   

## 2012-12-13 NOTE — Progress Notes (Signed)
Subjective:    Patient ID: Tammy [name], female    DOB: 23-Jul-1931, 79 y.o.   MRN: [id number]  HPI  79 year old patient who has treated hypertension and dyslipidemia. She has had some worsening fatigue for several months. She was seen in the ED 8 months ago and TSH was slightly elevated. She does have a sister who has died recently of lung cancer who had hypothyroidism. She also complains of burning dysuria and frequency. She does have a history of UTIs.  Past Medical History  Diagnosis Date  . BREAST BIOPSY, HX OF 06/10/2006  . HIP PAIN, LEFT 01/31/2007  . HYPERLIPIDEMIA 06/10/2006  . HYPERTENSION 06/10/2006  . NEOPLASMS UNSPEC NATURE BONE SOFT TISSUE&SKIN 05/31/2008  . OSTEOPOROSIS 06/10/2006  . Impaired glucose tolerance   . Breast cyst     History   Social History  . Marital Status: Married    Spouse Name: N/A    Number of Children: 4  . Years of Education: N/A   Occupational History  . Not on file.   Social History Main Topics  . Smoking status: Never Smoker   . Smokeless tobacco: Current User    Types: Snuff  . Alcohol Use: No  . Drug Use: No  . Sexual Activity: Yes   Other Topics Concern  . Not on file   Social History Narrative  . No narrative on file    Past Surgical History  Procedure Laterality Date  . Appendectomy    . Abdominal hysterectomy    . Breast surgery      bx x2  . Cholecystectomy  1980    Family History  Problem Relation Age of Onset  . Heart disease Mother     Pacemaker  . Heart disease Father   . Asthma Father   . Cancer Maternal Aunt     breast    No Known Allergies  Current Outpatient Prescriptions on File Prior to Visit  Medication Sig Dispense Refill  . alendronate (FOSAMAX) 70 MG tablet TAKE 1 TABLET BY MOUTH EVERY 7 DAYS WITH A FULL GLASS OF WATER ON AN EMPTY STOMACH  12 tablet  3  . ALPRAZolam (XANAX) 0.5 MG tablet TAKE 1 TABLET BY MOUTH TWICE A DAY AS NEEDED  60 tablet  1  . hydrochlorothiazide (HYDRODIURIL) 25 MG tablet  Take 1 tablet (25 mg total) by mouth daily.  90 tablet  6  . simvastatin (ZOCOR) 40 MG tablet Take 1 tablet (40 mg total) by mouth at bedtime.  90 tablet  4   No current facility-administered medications on file prior to visit.    BP 122/74  Pulse 68  Temp(Src) 97.8 F (36.6 C) (Oral)  Resp 20  Wt 174 lb (78.926 kg)  SpO2 98%       Review of Systems  Constitutional: Positive for fatigue.  HENT: Negative for congestion, dental problem, hearing loss, rhinorrhea, sinus pressure, sore throat and tinnitus.   Eyes: Negative for pain, discharge and visual disturbance.  Respiratory: Negative for cough and shortness of breath.   Cardiovascular: Negative for chest pain, palpitations and leg swelling.  Gastrointestinal: Negative for nausea, vomiting, abdominal pain, diarrhea, constipation, blood in stool and abdominal distention.  Genitourinary: Positive for dysuria, urgency and frequency. Negative for hematuria, flank pain, vaginal bleeding, vaginal discharge, difficulty urinating, vaginal pain and pelvic pain.  Musculoskeletal: Negative for arthralgias, gait problem and joint swelling.  Skin: Negative for rash.  Neurological: Negative for dizziness, syncope, speech difficulty, weakness, numbness and headaches.  Hematological:  Negative for adenopathy.  Psychiatric/Behavioral: Negative for behavioral problems, dysphoric mood and agitation. The patient is not nervous/anxious.        Objective:   Physical Exam  Constitutional: She is oriented to person, place, and time. She appears well-developed and well-nourished.  HENT:  Head: Normocephalic.  Right Ear: External ear normal.  Left Ear: External ear normal.  Mouth/Throat: Oropharynx is clear and moist.  Eyes: Conjunctivae and EOM are normal. Pupils are equal, round, and reactive to light.  Neck: Normal range of motion. Neck supple. No thyromegaly present.  Cardiovascular: Normal rate, regular rhythm, normal heart sounds and intact  distal pulses.   Pulmonary/Chest: Effort normal and breath sounds normal.  Mild kyphosis  Abdominal: Soft. Bowel sounds are normal. She exhibits no mass. There is no tenderness.  Musculoskeletal: Normal range of motion.  Lymphadenopathy:    She has no cervical adenopathy.  Neurological: She is alert and oriented to person, place, and time.  Skin: Skin is warm and dry. No rash noted.  Psychiatric: She has a normal mood and affect. Her behavior is normal.          Assessment & Plan:   UTI. Will treat with Cipro for 3 days Fatigue and history of elevated TSH. Will recheck TSH and screening lab Flu vaccine administered Recheck 3 months or as needed

## 2013-01-02 ENCOUNTER — Encounter: Payer: Self-pay | Admitting: Internal Medicine

## 2013-04-18 ENCOUNTER — Other Ambulatory Visit (HOSPITAL_COMMUNITY): Payer: Self-pay | Admitting: Cardiology

## 2013-04-18 ENCOUNTER — Other Ambulatory Visit: Payer: Self-pay | Admitting: Internal Medicine

## 2013-04-18 ENCOUNTER — Ambulatory Visit (HOSPITAL_COMMUNITY): Payer: Medicare PPO | Attending: Internal Medicine | Admitting: Cardiology

## 2013-04-18 ENCOUNTER — Ambulatory Visit (INDEPENDENT_AMBULATORY_CARE_PROVIDER_SITE_OTHER): Payer: Medicare PPO | Admitting: Internal Medicine

## 2013-04-18 ENCOUNTER — Encounter: Payer: Self-pay | Admitting: Internal Medicine

## 2013-04-18 VITALS — BP 136/80 | HR 86 | Temp 98.3°F | Resp 20 | Ht 67.0 in | Wt 183.0 lb

## 2013-04-18 DIAGNOSIS — E785 Hyperlipidemia, unspecified: Secondary | ICD-10-CM

## 2013-04-18 DIAGNOSIS — M79609 Pain in unspecified limb: Secondary | ICD-10-CM

## 2013-04-18 DIAGNOSIS — M7989 Other specified soft tissue disorders: Secondary | ICD-10-CM

## 2013-04-18 DIAGNOSIS — R609 Edema, unspecified: Secondary | ICD-10-CM | POA: Insufficient documentation

## 2013-04-18 DIAGNOSIS — M79605 Pain in left leg: Secondary | ICD-10-CM

## 2013-04-18 DIAGNOSIS — I10 Essential (primary) hypertension: Secondary | ICD-10-CM

## 2013-04-18 MED ORDER — ALPRAZOLAM 0.5 MG PO TABS: ORAL_TABLET | ORAL | Status: DC

## 2013-04-18 NOTE — Progress Notes (Signed)
Subjective:    Patient ID: Tammy [name], female    DOB: [birth year], 80 y.o.   MRN: [id number]  HPI and 80 year old patient who has a history of venous insufficiency.  For the past 5, days she's had pain involving her left lower leg and foot area.  Pain seems to be worse at night and is associated with some minimal swelling.  She also describes some chronic left fifth toe discomfort. No history of prior DVT. She has had treated hypertension and history of mild anxiety disorder.  Medications updated today.  She has mild dyslipidemia as well  Past Medical History  Diagnosis Date  . BREAST BIOPSY, HX OF 06/10/2006  . HIP PAIN, LEFT 01/31/2007  . HYPERLIPIDEMIA 06/10/2006  . HYPERTENSION 06/10/2006  . NEOPLASMS UNSPEC NATURE BONE SOFT TISSUE&SKIN 05/31/2008  . OSTEOPOROSIS 06/10/2006  . Impaired glucose tolerance   . Breast cyst     History   Social History  . Marital Status: Married    Spouse Name: N/A    Number of Children: 4  . Years of Education: N/A   Occupational History  . Not on file.   Social History Main Topics  . Smoking status: Never Smoker   . Smokeless tobacco: Current User    Types: Snuff  . Alcohol Use: No  . Drug Use: No  . Sexual Activity: Yes   Other Topics Concern  . Not on file   Social History Narrative  . No narrative on file    Past Surgical History  Procedure Laterality Date  . Appendectomy    . Abdominal hysterectomy    . Breast surgery      bx x2  . Cholecystectomy  1980    Family History  Problem Relation [name] of Onset  . Heart disease Mother     Pacemaker  . Heart disease Father   . Asthma Father   . Cancer Maternal Aunt     breast    No Known Allergies  Current Outpatient Prescriptions on File Prior to Visit  Medication Sig Dispense Refill  . alendronate (FOSAMAX) 70 MG tablet TAKE 1 TABLET BY MOUTH EVERY 7 DAYS WITH A FULL GLASS OF WATER ON AN EMPTY STOMACH  12 tablet  3  . hydrochlorothiazide (HYDRODIURIL) 25 MG tablet Take 1  tablet (25 mg total) by mouth daily.  90 tablet  6  . simvastatin (ZOCOR) 40 MG tablet Take 1 tablet (40 mg total) by mouth at bedtime.  90 tablet  4   No current facility-administered medications on file prior to visit.    BP 136/80  Pulse 86  Temp(Src) 98.3 F (36.8 C) (Oral)  Resp 20  Ht 5\' 7"  (1.702 m)  Wt 183 lb (83.008 kg)  BMI 28.66 kg/m2  SpO2 97%       Review of Systems  Constitutional: Negative.   HENT: Negative for congestion, dental problem, hearing loss, rhinorrhea, sinus pressure, sore throat and tinnitus.   Eyes: Negative for pain, discharge and visual disturbance.  Respiratory: Negative for cough and shortness of breath.   Cardiovascular: Positive for leg swelling. Negative for chest pain and palpitations.  Gastrointestinal: Negative for nausea, vomiting, abdominal pain, diarrhea, constipation, blood in stool and abdominal distention.  Genitourinary: Negative for dysuria, urgency, frequency, hematuria, flank pain, vaginal bleeding, vaginal discharge, difficulty urinating, vaginal pain and pelvic pain.  Musculoskeletal: Positive for arthralgias. Negative for gait problem and joint swelling.  Skin: Negative for rash.  Neurological: Negative for dizziness, syncope, speech difficulty,  weakness, numbness and headaches.  Hematological: Negative for adenopathy.  Psychiatric/Behavioral: Negative for behavioral problems, dysphoric mood and agitation. The patient is not nervous/anxious.        Objective:   Physical Exam  Constitutional: She is oriented to person, place, and time. She appears well-developed and well-nourished.  HENT:  Head: Normocephalic.  Right Ear: External ear normal.  Left Ear: External ear normal.  Mouth/Throat: Oropharynx is clear and moist.  Eyes: Conjunctivae and EOM are normal. Pupils are equal, round, and reactive to light.  Neck: Normal range of motion. Neck supple. No thyromegaly present.  Cardiovascular: Normal rate, regular rhythm,  normal heart sounds and intact distal pulses.   Pulmonary/Chest: Effort normal and breath sounds normal.  Abdominal: Soft. Bowel sounds are normal. She exhibits no mass. There is no tenderness.  Musculoskeletal: Normal range of motion. She exhibits edema and tenderness.  Mild left calf tenderness, and mild left ankle swelling  Lymphadenopathy:    She has no cervical adenopathy.  Neurological: She is alert and oriented to person, place, and time.  Skin: Skin is warm and dry. No rash noted.  Psychiatric: She has a normal mood and affect. Her behavior is normal.          Assessment & Plan:  Hypertension well controlled Dyslipidemia.  Continue statin therapy Left leg pain and swelling.  Rule out DVT.  A venous Doppler study will be obtained

## 2013-04-18 NOTE — Patient Instructions (Signed)
Limit your sodium (Salt) intake  Please check your blood pressure on a regular basis.  If it is consistently greater than 150/90, please make an office appointment. Return in 6 months for follow-up  Lower extremity venous Doppler study as discussed

## 2013-04-18 NOTE — Progress Notes (Signed)
Pre-visit discussion using our clinic review tool. No additional management support is needed unless otherwise documented below in the visit note.  

## 2013-04-18 NOTE — Progress Notes (Signed)
Lower venous duplex limited complete. 

## 2013-04-19 ENCOUNTER — Telehealth: Payer: Self-pay

## 2013-04-19 NOTE — Telephone Encounter (Signed)
Pt had sonogram on left leg 04/18/13 and would like to know the results

## 2013-04-20 NOTE — Telephone Encounter (Signed)
Please call/notify patient that lab/test/procedure is normal 

## 2013-04-20 NOTE — Telephone Encounter (Signed)
Please advise 

## 2013-04-20 NOTE — Telephone Encounter (Signed)
Spoke to pt told her Doppler study was normal per Dr.K  Pt verbalized understanding and wanted to know what to do for the pain. Told her she can take Tylenol or Ibuprofen at bedtime and elevate leg to see if that helps and I will check with Dr. Raliegh Ip to see if he recommends anything else and get back to you. Pt verbalized understanding.

## 2013-04-21 NOTE — Telephone Encounter (Signed)
Agree with present recommendations Call next week if unimproved

## 2013-04-21 NOTE — Telephone Encounter (Signed)
Dr. Raliegh Ip, any further recommendations for pt's leg pain, see below and advise

## 2013-04-21 NOTE — Telephone Encounter (Signed)
Spoke to pt told her Dr. Raliegh Ip said he agreed with present recommendations Call next week if unimproved. Pt verbalized understanding.

## 2013-05-03 ENCOUNTER — Ambulatory Visit (INDEPENDENT_AMBULATORY_CARE_PROVIDER_SITE_OTHER): Payer: Medicare HMO | Admitting: Internal Medicine

## 2013-05-03 ENCOUNTER — Encounter: Payer: Self-pay | Admitting: Internal Medicine

## 2013-05-03 ENCOUNTER — Telehealth: Payer: Self-pay | Admitting: Internal Medicine

## 2013-05-03 VITALS — BP 142/70 | HR 75 | Temp 98.0°F | Resp 20 | Ht 67.0 in | Wt 184.0 lb

## 2013-05-03 DIAGNOSIS — M79609 Pain in unspecified limb: Secondary | ICD-10-CM | POA: Diagnosis not present

## 2013-05-03 DIAGNOSIS — M79605 Pain in left leg: Secondary | ICD-10-CM

## 2013-05-03 DIAGNOSIS — I10 Essential (primary) hypertension: Secondary | ICD-10-CM

## 2013-05-03 DIAGNOSIS — E785 Hyperlipidemia, unspecified: Secondary | ICD-10-CM

## 2013-05-03 NOTE — Telephone Encounter (Signed)
Relevant patient education mailed to patient.  

## 2013-05-03 NOTE — Patient Instructions (Signed)
Lower extremity vascular studies as discussed  Call with any clinical worsening  Acute bronchitis symptoms for less than 10 days are generally not helped by antibiotics.  Take over-the-counter expectorants and cough medications such as  Mucinex DM.  Call if there is no improvement in 5 to 7 days or if he developed worsening cough, fever, or new symptoms, such as shortness of breath or chest pain.

## 2013-05-03 NOTE — Progress Notes (Signed)
Subjective:    Patient ID: Tammy [name] Age, female    DOB: 1931-06-18, 80 y.o.   MRN: [id number]  HPI  80 year old patient, lifelong nonsmoker, who has a history of hypertension, and dyslipidemia.  She continues to have left leg and toe pain.  She is concerned about vascular insufficiency.  She states she walks with a left and pain is worse with ambulation.  She states her feet are chronically cool and cyanotic  Past Medical History  Diagnosis Date  . BREAST BIOPSY, HX OF 06/10/2006  . HIP PAIN, LEFT 01/31/2007  . HYPERLIPIDEMIA 06/10/2006  . HYPERTENSION 06/10/2006  . NEOPLASMS UNSPEC NATURE BONE SOFT TISSUE&SKIN 05/31/2008  . OSTEOPOROSIS 06/10/2006  . Impaired glucose tolerance   . Breast cyst     History   Social History  . Marital Status: Married    Spouse Name: N/A    Number of Children: 4  . Years of Education: N/A   Occupational History  . Not on file.   Social History Main Topics  . Smoking status: Never Smoker   . Smokeless tobacco: Current User    Types: Snuff  . Alcohol Use: No  . Drug Use: No  . Sexual Activity: Yes   Other Topics Concern  . Not on file   Social History Narrative  . No narrative on file    Past Surgical History  Procedure Laterality Date  . Appendectomy    . Abdominal hysterectomy    . Breast surgery      bx x2  . Cholecystectomy  1980    Family History  Problem Relation Age of Onset  . Heart disease Mother     Pacemaker  . Heart disease Father   . Asthma Father   . Cancer Maternal Aunt     breast    No Known Allergies  Current Outpatient Prescriptions on File Prior to Visit  Medication Sig Dispense Refill  . alendronate (FOSAMAX) 70 MG tablet TAKE 1 TABLET BY MOUTH EVERY 7 DAYS WITH A FULL GLASS OF WATER ON AN EMPTY STOMACH  12 tablet  3  . ALPRAZolam (XANAX) 0.5 MG tablet TAKE 1 TABLET BY MOUTH TWICE A DAY AS NEEDED  60 tablet  2  . hydrochlorothiazide (HYDRODIURIL) 25 MG tablet Take 1 tablet (25 mg total) by mouth daily.  90  tablet  6  . simvastatin (ZOCOR) 40 MG tablet Take 1 tablet (40 mg total) by mouth at bedtime.  90 tablet  4   No current facility-administered medications on file prior to visit.    BP 142/70  Pulse 75  Temp(Src) 98 F (36.7 C) (Oral)  Resp 20  Ht 5\' 7"  (1.702 m)  Wt 184 lb (83.462 kg)  BMI 28.81 kg/m2  SpO2 97%       Review of Systems  Constitutional: Negative.   HENT: Negative for congestion, dental problem, hearing loss, rhinorrhea, sinus pressure, sore throat and tinnitus.   Eyes: Negative for pain, discharge and visual disturbance.  Respiratory: Negative for cough and shortness of breath.   Cardiovascular: Negative for chest pain, palpitations and leg swelling.  Gastrointestinal: Negative for nausea, vomiting, abdominal pain, diarrhea, constipation, blood in stool and abdominal distention.  Genitourinary: Negative for dysuria, urgency, frequency, hematuria, flank pain, vaginal bleeding, vaginal discharge, difficulty urinating, vaginal pain and pelvic pain.  Musculoskeletal: Negative for arthralgias, gait problem and joint swelling.  Skin: Negative for rash.  Neurological: Negative for dizziness, syncope, speech difficulty, weakness, numbness and headaches.  Hematological: Negative  for adenopathy.  Psychiatric/Behavioral: Negative for behavioral problems, dysphoric mood and agitation. The patient is not nervous/anxious.        Objective:   Physical Exam  Constitutional: She appears well-developed and well-nourished. No distress.  Cardiovascular:  The right dorsalis pedis pulse full.  Other pedal pulses were nonpalpable  Musculoskeletal:  Both feet were cool to touch and slightly cyanotic No ulceration.          Assessment & Plan:   Left leg pain.  We'll obtain arterial studies to rule out advanced arterial insufficiency Hypertension stable Dyslipidemia

## 2013-05-03 NOTE — Progress Notes (Signed)
Pre-visit discussion using our clinic review tool. No additional management support is needed unless otherwise documented below in the visit note.  

## 2013-05-05 ENCOUNTER — Ambulatory Visit (HOSPITAL_COMMUNITY): Payer: Medicare PPO | Attending: Cardiology | Admitting: *Deleted

## 2013-05-05 ENCOUNTER — Encounter: Payer: Self-pay | Admitting: Cardiology

## 2013-05-05 DIAGNOSIS — I739 Peripheral vascular disease, unspecified: Secondary | ICD-10-CM

## 2013-05-05 DIAGNOSIS — M79605 Pain in left leg: Secondary | ICD-10-CM

## 2013-05-05 DIAGNOSIS — R0989 Other specified symptoms and signs involving the circulatory and respiratory systems: Secondary | ICD-10-CM | POA: Insufficient documentation

## 2013-05-05 NOTE — Progress Notes (Signed)
Lower extremity arterial doppler and duplex complete.

## 2013-05-09 ENCOUNTER — Telehealth: Payer: Self-pay | Admitting: Internal Medicine

## 2013-05-09 DIAGNOSIS — I999 Unspecified disorder of circulatory system: Secondary | ICD-10-CM

## 2013-05-09 NOTE — Telephone Encounter (Signed)
Notified patient that arterial Doppler revealed significant disease involving her left leg.  Please schedule a vascular surgery consultation

## 2013-05-09 NOTE — Telephone Encounter (Signed)
Pt had ultra sound on her leg on Friday and wants to know if the results are back.

## 2013-05-09 NOTE — Telephone Encounter (Signed)
Please advise 

## 2013-05-10 NOTE — Telephone Encounter (Signed)
Spoke to pt told her Arterial Doppler showed significant disease on the left leg and need to see a vascular surgeon per Dr. Raliegh Ip. Pt verbalized understanding. Told pt someone will contact her regarding an appointment. Pt verbalized understanding.

## 2013-05-29 ENCOUNTER — Telehealth: Payer: Self-pay | Admitting: Internal Medicine

## 2013-05-29 NOTE — Telephone Encounter (Signed)
[facility] need [facility] referral for pt. appt May 13 at 9am Dr Deitra Mayo  npi [phone] Pt has never seen there before Fax [phone]

## 2013-05-29 NOTE — Telephone Encounter (Signed)
Faxed the referral to [facility]  For pt to go to  [facility] [place], [place] [phone]...awaiting authorization for visits    [facility] need [facility] referral for pt. appt May 13 at 9am  Dr Deitra Mayo [facility] [phone]  Pt has never seen there before  Fax [phone]

## 2013-05-31 ENCOUNTER — Ambulatory Visit (INDEPENDENT_AMBULATORY_CARE_PROVIDER_SITE_OTHER): Payer: Medicare PPO | Admitting: Internal Medicine

## 2013-05-31 ENCOUNTER — Encounter: Payer: Self-pay | Admitting: Internal Medicine

## 2013-05-31 VITALS — BP 156/80 | HR 69 | Temp 98.4°F | Resp 20 | Ht 67.0 in | Wt 185.0 lb

## 2013-05-31 DIAGNOSIS — I739 Peripheral vascular disease, unspecified: Secondary | ICD-10-CM

## 2013-05-31 DIAGNOSIS — I10 Essential (primary) hypertension: Secondary | ICD-10-CM

## 2013-05-31 DIAGNOSIS — M810 Age-related osteoporosis without current pathological fracture: Secondary | ICD-10-CM

## 2013-05-31 DIAGNOSIS — E785 Hyperlipidemia, unspecified: Secondary | ICD-10-CM

## 2013-05-31 DIAGNOSIS — Z23 Encounter for immunization: Secondary | ICD-10-CM

## 2013-05-31 NOTE — Progress Notes (Signed)
Pre-visit discussion using our clinic review tool. No additional management support is needed unless otherwise documented below in the visit note.  

## 2013-06-01 ENCOUNTER — Ambulatory Visit: Payer: Medicare PPO | Admitting: Internal Medicine

## 2013-06-01 ENCOUNTER — Encounter: Payer: Self-pay | Admitting: Internal Medicine

## 2013-06-01 DIAGNOSIS — I739 Peripheral vascular disease, unspecified: Secondary | ICD-10-CM | POA: Insufficient documentation

## 2013-06-01 NOTE — Patient Instructions (Signed)
Vascular surgery consultation as scheduled  Call or return to clinic prn if these symptoms worsen or fail to improve as anticipated.  Return in 3 months for follow-up

## 2013-06-01 NOTE — Progress Notes (Signed)
   Subjective:    Patient ID: Tammy [name], female    DOB: 1931/04/05, 80 y.o.   MRN: [id number]  HPI  80 year old patient who is seen today for reevaluation and to discuss recent vascular studies.  She was seen recently complaining of worsening left toe and leg pain, especially with activity.  Vascular studies revealed an ABI of .38 on the left compared to .88 on the right.  Arterial duplex revealed a left popliteal occlusion with distal reconstruction.  She is scheduled to see vascular surgery in 7 days.  Past Medical History  Diagnosis Date  . BREAST BIOPSY, HX OF 06/10/2006  . HIP PAIN, LEFT 01/31/2007  . HYPERLIPIDEMIA 06/10/2006  . HYPERTENSION 06/10/2006  . NEOPLASMS UNSPEC NATURE BONE SOFT TISSUE&SKIN 05/31/2008  . OSTEOPOROSIS 06/10/2006  . Impaired glucose tolerance   . Breast cyst     History   Social History  . Marital Status: Married    Spouse Name: N/A    Number of Children: 4  . Years of Education: N/A   Occupational History  . Not on file.   Social History Main Topics  . Smoking status: Never Smoker   . Smokeless tobacco: Current User    Types: Snuff  . Alcohol Use: No  . Drug Use: No  . Sexual Activity: Yes   Other Topics Concern  . Not on file   Social History Narrative  . No narrative on file    Past Surgical History  Procedure Laterality Date  . Appendectomy    . Abdominal hysterectomy    . Breast surgery      bx x2  . Cholecystectomy  1980    Family History  Problem Relation [name] of Onset  . Heart disease Mother     Pacemaker  . Heart disease Father   . Asthma Father   . Cancer Maternal Aunt     breast    No Known Allergies  Current Outpatient Prescriptions on File Prior to Visit  Medication Sig Dispense Refill  . alendronate (FOSAMAX) 70 MG tablet TAKE 1 TABLET BY MOUTH EVERY 7 DAYS WITH A FULL GLASS OF WATER ON AN EMPTY STOMACH  12 tablet  3  . ALPRAZolam (XANAX) 0.5 MG tablet TAKE 1 TABLET BY MOUTH TWICE A DAY AS NEEDED  60 tablet  2  .  hydrochlorothiazide (HYDRODIURIL) 25 MG tablet Take 1 tablet (25 mg total) by mouth daily.  90 tablet  6  . simvastatin (ZOCOR) 40 MG tablet Take 1 tablet (40 mg total) by mouth at bedtime.  90 tablet  4   No current facility-administered medications on file prior to visit.    BP 156/80  Pulse 69  Temp(Src) 98.4 F (36.9 C) (Oral)  Resp 20  Ht 5\' 7"  (1.702 m)  Wt 185 lb (83.915 kg)  BMI 28.97 kg/m2  SpO2 98%       Review of Systems  Musculoskeletal:       Left leg, and left throat pain with ambulation       Objective:   Physical Exam  Musculoskeletal:  Left pedal pulse is absent No ulceration Both feet slightly cool to touch, but no significant cyanosis          Assessment & Plan:   PA D. moderately severe left leg.  Vascular surgery to evaluate.  We'll continue aggressive risk factor modification with good blood pressure control and statin therapy. Hypertension stable  Recheck 3 months

## 2013-06-06 ENCOUNTER — Encounter: Payer: Self-pay | Admitting: Vascular Surgery

## 2013-06-07 ENCOUNTER — Ambulatory Visit (INDEPENDENT_AMBULATORY_CARE_PROVIDER_SITE_OTHER): Payer: Medicare PPO | Admitting: Vascular Surgery

## 2013-06-07 ENCOUNTER — Encounter: Payer: Self-pay | Admitting: Vascular Surgery

## 2013-06-07 VITALS — BP 149/78 | HR 79 | Ht 67.0 in | Wt 180.5 lb

## 2013-06-07 DIAGNOSIS — M79609 Pain in unspecified limb: Secondary | ICD-10-CM

## 2013-06-07 DIAGNOSIS — I739 Peripheral vascular disease, unspecified: Secondary | ICD-10-CM

## 2013-06-07 NOTE — Progress Notes (Signed)
[facility]  Patient name: Tammy [name] MRN: [id number] DOB: Jun 10, 1931 Sex: female  REASON FOR CONSULT: peripheral arterial disease. Referred by Dr. Burnice Logan  HPI: Tammy [name] is a 80 y.o. female does admit to some left calf claudication and a fairly short distance. She denies any significant diarrhea claudication. In addition she describes some rest pain in the left leg with no symptoms on the right side. She denies any history of nonhealing ulcers.  Her risk factors for peripheral vascular disease include hypertension and hypercholesterolemia. She denies any history of diabetes, history of premature cardiovascular disease, or history of tobacco use.   Past Medical History  Diagnosis Date  . BREAST BIOPSY, HX OF 06/10/2006  . HIP PAIN, LEFT 01/31/2007  . HYPERLIPIDEMIA 06/10/2006  . HYPERTENSION 06/10/2006  . NEOPLASMS UNSPEC NATURE BONE SOFT TISSUE&SKIN 05/31/2008  . OSTEOPOROSIS 06/10/2006  . Impaired glucose tolerance   . Breast cyst    Family History  Problem Relation Age of Onset  . Heart disease Mother     Pacemaker  . Heart disease Father   . Asthma Father   . Diabetes Father   . Cancer Maternal Aunt     breast  . Cancer Sister   . Heart disease Sister   . Hyperlipidemia Sister   . Hypertension Sister    SOCIAL HISTORY: History  Substance Use Topics  . Smoking status: Never Smoker   . Smokeless tobacco: Current User    Types: Snuff  . Alcohol Use: No   No Known Allergies Current Outpatient Prescriptions  Medication Sig Dispense Refill  . alendronate (FOSAMAX) 70 MG tablet TAKE 1 TABLET BY MOUTH EVERY 7 DAYS WITH A FULL GLASS OF WATER ON AN EMPTY STOMACH  12 tablet  3  . ALPRAZolam (XANAX) 0.5 MG tablet TAKE 1 TABLET BY MOUTH TWICE A DAY AS NEEDED  60 tablet  2  . hydrochlorothiazide (HYDRODIURIL) 25 MG tablet Take 1 tablet (25 mg total) by mouth daily.  90 tablet  6  . simvastatin (ZOCOR) 40 MG tablet Take 1 tablet (40 mg total) by  mouth at bedtime.  90 tablet  4   No current facility-administered medications for this visit.   REVIEW OF SYSTEMS: Valu.Nieves ] denotes positive finding; [  ] denotes negative finding  CARDIOVASCULAR:  [ ]  chest pain   [ ]  chest pressure   [ ]  palpitations   [ ]  orthopnea   [ ]  dyspnea on exertion   Valu.Nieves ] claudication   Valu.Nieves ] rest pain   [ ]  DVT   [ ]  phlebitis PULMONARY:   [ ]  productive cough   [ ]  asthma   [ ]  wheezing NEUROLOGIC:   Valu.Nieves ] weakness  [ ]  paresthesias  [ ]  aphasia  [ ]  amaurosis  [ ]  dizziness HEMATOLOGIC:   [ ]  bleeding problems   [ ]  clotting disorders MUSCULOSKELETAL:  [ ]  joint pain   [ ]  joint swelling [ X] leg swelling GASTROINTESTINAL: [ ]   blood in stool  [ ]   hematemesis GENITOURINARY:  [ ]   dysuria  [ ]   hematuria PSYCHIATRIC:  [ ]  history of major depression INTEGUMENTARY:  [ ]  rashes  [ ]  ulcers CONSTITUTIONAL:  [ ]  fever   [ ]  chills  PHYSICAL EXAM: Filed Vitals:   06/07/13 0853  BP: 149/78  Pulse: 79  Height: 5\' 7"  (1.702 m)  Weight: 180 lb 8 oz (81.874 kg)  SpO2: 100%  Body mass index is 28.26 kg/(m^2). GENERAL: The patient is a well-nourished female, in no acute distress. The vital signs are documented above. CARDIOVASCULAR: There is a regular rate and rhythm. I do not detect carotid bruits. She has palpable femoral pulses. On the left side, which is asymptomatic side, I cannot palpate popliteal or pedal pulses. On the right side, she has a palpable popliteal and dorsalis pedis pulse. I cannot palpate a posterior tibial pulse. PULMONARY: There is good air exchange bilaterally without wheezing or rales. ABDOMEN: Soft and non-tender with normal pitched bowel sounds.  MUSCULOSKELETAL: There are no major deformities or cyanosis. NEUROLOGIC: No focal weakness or paresthesias are detected. SKIN: she has evidence of chronic venous insufficiency with hyperpigmentation bilaterally and varicose veins especially in the left lower leg. PSYCHIATRIC: The patient has a  normal affect.  DATA:  I have reviewed her arterial Doppler study which was done on 05/05/2013 at the [place] office. ABI on the right was 100%. ABI on the left was 50%. On the left side by duplex she was noted to have a short segment occlusion of the popliteal artery with two-vessel runoff. The duplex did suggest mild iliac disease on the left.  I have also reviewed her records from the [facility] primary care. Hypertension has been well controlled and her cholesterol has been well controlled.  MEDICAL ISSUES: PAD (peripheral artery disease) This patient has evidence of infrainguinal arterial occlusive disease on the left. She has no significant disease on the right. She does have some early rest pain however her symptoms are quite tolerable. We have discussed the option of proceeding with arteriography to evaluate for for revascularization, however most likely, given that she has infrainguinal arterial occlusive disease we will likely not find disease amenable to angioplasty. Given her age she would be a slightly higher risk for a lower extremity bypass. She is comfortable following up in 3 months and I have ordered ABIs for that time. Certainly if her symptoms progress before that time we could proceed with arteriography. I have discussed the indications for arteriography and the potential complications including the potential complications of angioplasty and stenting. I have encouraged her to stay as active as possible. Fortunately she is not a smoker. I will see her back in 3 months.  Note, she does have evidence of chronic venous insufficiency and varicose veins. Given her peripheral arterial disease on the left she cannot really elevate her legs much because of rest pain. Likewise I would not recommend significant compression given her peripheral arterial disease.   Angelia Mould [facility]: [phone]

## 2013-06-07 NOTE — Addendum Note (Signed)
Addended by: Mena Goes on: 06/07/2013 10:54 AM   Modules accepted: Orders

## 2013-06-07 NOTE — Assessment & Plan Note (Signed)
This patient has evidence of infrainguinal arterial occlusive disease on the left. She has no significant disease on the right. She does have some early rest pain however her symptoms are quite tolerable. We have discussed the option of proceeding with arteriography to evaluate for for revascularization, however most likely, given that she has infrainguinal arterial occlusive disease we will likely not find disease amenable to angioplasty. Given her age she would be a slightly higher risk for a lower extremity bypass. She is comfortable following up in 3 months and I have ordered ABIs for that time. Certainly if her symptoms progress before that time we could proceed with arteriography. I have discussed the indications for arteriography and the potential complications including the potential complications of angioplasty and stenting. I have encouraged her to stay as active as possible. Fortunately she is not a smoker. I will see her back in 3 months.  Note, she does have evidence of chronic venous insufficiency and varicose veins. Given her peripheral arterial disease on the left she cannot really elevate her legs much because of rest pain. Likewise I would not recommend significant compression given her peripheral arterial disease.

## 2013-06-12 ENCOUNTER — Ambulatory Visit: Payer: Medicare PPO | Admitting: Internal Medicine

## 2013-06-28 ENCOUNTER — Encounter: Payer: Self-pay | Admitting: Internal Medicine

## 2013-06-28 ENCOUNTER — Ambulatory Visit (INDEPENDENT_AMBULATORY_CARE_PROVIDER_SITE_OTHER): Payer: Medicare PPO | Admitting: Internal Medicine

## 2013-06-28 VITALS — BP 110/70 | HR 58 | Temp 98.3°F | Resp 18 | Ht 66.5 in | Wt 178.0 lb

## 2013-06-28 DIAGNOSIS — I10 Essential (primary) hypertension: Secondary | ICD-10-CM

## 2013-06-28 DIAGNOSIS — I739 Peripheral vascular disease, unspecified: Secondary | ICD-10-CM

## 2013-06-28 DIAGNOSIS — E785 Hyperlipidemia, unspecified: Secondary | ICD-10-CM

## 2013-06-28 DIAGNOSIS — Z0000 Encounter for general adult medical examination without abnormal findings: Secondary | ICD-10-CM

## 2013-06-28 MED ORDER — HYDROCHLOROTHIAZIDE 25 MG PO TABS: 25.0000 mg | ORAL_TABLET | Freq: Every day | ORAL | Status: DC

## 2013-06-28 MED ORDER — SIMVASTATIN 40 MG PO TABS: 40.0000 mg | ORAL_TABLET | Freq: Every day | ORAL | Status: DC

## 2013-06-28 MED ORDER — ALENDRONATE SODIUM 70 MG PO TABS: ORAL_TABLET | ORAL | Status: DC

## 2013-06-28 MED ORDER — ALPRAZOLAM 0.5 MG PO TABS: ORAL_TABLET | ORAL | Status: DC

## 2013-06-28 NOTE — Progress Notes (Signed)
Subjective:    Patient ID: Tammy [name], female    DOB: 1931/03/05, 80 y.o.   MRN: [id number]  HPI   80 year old patient, lifelong nonsmoker, who has a history of hypertension, and dyslipidemia.  She is seen today for a preventive health examination  Medical problems include peripheral vascular disease, osteoarthritis, and osteoporosis.  She is now being followed by vascular surgery.  Her left leg pain has improved.  She has been hospitalized recently for syncope with a negative evaluation. This has been felt to be neurocardiogenic. No recurrent syncope.   Medicare wellness exam  1. Risk factors, based on past M,S,F history- cardiovascular risk factors include [name] hypertension and dyslipidemia .  She has known PAD 2. Physical activities: Fairly active and independent but no rigorous exercise program .  History of left leg claudication 3. Depression/mood: No history depression or mood disorder  4. Hearing: No major deficits  5. ADL's: Remains independent in all aspects of daily living  6. Fall risk: Moderate due to [name]  36. Home safety: No problems identified  8. Height weight, and visual acuity; height and weight fairly stable. There has been a voluntary 8 pound weight loss no change in visual acuity  9. Counseling: Heart healthy diet more regular exercise are encouraged  10. Lab orders based on risk factors: Laboratory profile including lipid panel will be reviewed  11. Referral : Not appropriate at this time  12. Care plan: Low-salt heart healthy diet encouraged we'll continue calcium and vitamin D supplements as well as low-dose aspirin  13. Cognitive assessment: Alert and oriented with normal affect. No cognitive dysfunction   Preventive Screening-Counseling & Management  Alcohol-Tobacco  Smoking Status: never   Allergies (verified):  No Known Drug Allergies   Past History:  Past Medical History:   Hyperlipidemia  Hypertension  Osteoporosis  Recurrent syncope  PAD  Past  Surgical History:   Appendectomy  Cholecystectomy  Hysterectomy  gravida 4, para 4, abortus zero  breast biopsies x 2 in 1985, 1998  colonoscopy- none (declines)   Family History:   father died at [name] 19, history of asthma, CAD  mother died [name] 18 of CAD  One brother died of accidental death  two half sisters, positive CAD  one sister is well   Social History:  Reviewed history from 01/19/2007 and no changes required.  9 grandchildren and 9 great grandchildren      Past Medical History  Diagnosis Date  . BREAST BIOPSY, HX OF 06/10/2006  . HIP PAIN, LEFT 01/31/2007  . HYPERLIPIDEMIA 06/10/2006  . HYPERTENSION 06/10/2006  . NEOPLASMS UNSPEC NATURE BONE SOFT TISSUE&SKIN 05/31/2008  . OSTEOPOROSIS 06/10/2006  . Impaired glucose tolerance   . Breast cyst     History   Social History  . Marital Status: Married    Spouse Name: N/A    Number of Children: 4  . Years of Education: N/A   Occupational History  . Not on file.   Social History Main Topics  . Smoking status: Never Smoker   . Smokeless tobacco: Current User    Types: Snuff  . Alcohol Use: No  . Drug Use: No  . Sexual Activity: Yes   Other Topics Concern  . Not on file   Social History Narrative  . No narrative on file    Past Surgical History  Procedure Laterality Date  . Appendectomy    . Abdominal hysterectomy    . Breast surgery      bx x2  .  Cholecystectomy  1980    Family History  Problem Relation [name] of Onset  . Heart disease Mother     Pacemaker  . Heart disease Father   . Asthma Father   . Diabetes Father   . Cancer Maternal Aunt     breast  . Cancer Sister   . Heart disease Sister   . Hyperlipidemia Sister   . Hypertension Sister     No Known Allergies  No current outpatient prescriptions on file prior to visit.   No current facility-administered medications on file prior to visit.    BP 110/70  Pulse 58  Temp(Src) 98.3 F (36.8 C) (Oral)  Resp 18  Ht 5' 6.5" (1.689  m)  Wt 178 lb (80.74 kg)  BMI 28.30 kg/m2  SpO2 97%       Review of Systems  Constitutional: Negative.   HENT: Negative for congestion, dental problem, hearing loss, rhinorrhea, sinus pressure, sore throat and tinnitus.   Eyes: Negative for pain, discharge and visual disturbance.  Respiratory: Negative for cough and shortness of breath.   Cardiovascular: Negative for chest pain, palpitations and leg swelling.       L Leg claudication improved  Gastrointestinal: Negative for nausea, vomiting, abdominal pain, diarrhea, constipation, blood in stool and abdominal distention.  Genitourinary: Negative for dysuria, urgency, frequency, hematuria, flank pain, vaginal bleeding, vaginal discharge, difficulty urinating, vaginal pain and pelvic pain.  Musculoskeletal: Negative for arthralgias, gait problem and joint swelling.  Skin: Negative for rash.  Neurological: Negative for dizziness, syncope, speech difficulty, weakness, numbness and headaches.  Hematological: Negative for adenopathy.  Psychiatric/Behavioral: Negative for behavioral problems, dysphoric mood and agitation. The patient is not nervous/anxious.        Objective:   Physical Exam  Constitutional: She is oriented to person, place, and time. She appears well-developed and well-nourished. No distress.  HENT:  Head: Normocephalic and atraumatic.  Right Ear: External ear normal.  Left Ear: External ear normal.  Mouth/Throat: Oropharynx is clear and moist.  Eyes: Conjunctivae and EOM are normal.  Neck: Normal range of motion. Neck supple. No JVD present. No thyromegaly present.  Cardiovascular: Normal rate, regular rhythm, normal heart sounds and intact distal pulses.   No murmur heard. The right dorsalis pedis pulse full.  Other pedal pulses were nonpalpable  Pulmonary/Chest: Effort normal and breath sounds normal. She has no wheezes. She has no rales.  Abdominal: Soft. Bowel sounds are normal. She exhibits no distension and  no mass. There is no tenderness. There is no rebound and no guarding.  Genitourinary: Vagina normal.  Musculoskeletal: Normal range of motion. She exhibits no edema and no tenderness.  Both feet were cool to touch and slightly cyanotic No ulceration.  Neurological: She is alert and oriented to person, place, and time. She has normal reflexes. No cranial nerve deficit. She exhibits normal muscle tone. Coordination normal.  Skin: Skin is warm and dry. No rash noted.  Feet cool and sl cyanotic  Psychiatric: She has a normal mood and affect. Her behavior is normal.          Assessment & Plan:  PAD Hypertension stable Dyslipidemia Prev Health exam  Meds updated f/u Vascular  ROV 6 months

## 2013-06-28 NOTE — Patient Instructions (Signed)
Limit your sodium (Salt) intake    It is important that you exercise regularly, at least 20 minutes 3 to 4 times per week.  If you develop chest pain or shortness of breath seek  medical attention.  Return in 6 months for follow-up  

## 2013-06-28 NOTE — Progress Notes (Signed)
Pre-visit discussion using our clinic review tool. No additional management support is needed unless otherwise documented below in the visit note.  

## 2013-09-05 ENCOUNTER — Encounter: Payer: Self-pay | Admitting: Vascular Surgery

## 2013-09-06 ENCOUNTER — Ambulatory Visit (INDEPENDENT_AMBULATORY_CARE_PROVIDER_SITE_OTHER): Payer: Commercial Managed Care - HMO | Admitting: Vascular Surgery

## 2013-09-06 ENCOUNTER — Encounter: Payer: Self-pay | Admitting: Vascular Surgery

## 2013-09-06 ENCOUNTER — Ambulatory Visit (HOSPITAL_COMMUNITY)
Admission: RE | Admit: 2013-09-06 | Discharge: 2013-09-06 | Disposition: A | Discharge status: Home / Self Care | Payer: Medicare HMO | Source: Ambulatory Visit | Attending: Vascular Surgery | Admitting: Vascular Surgery

## 2013-09-06 VITALS — BP 156/72 | HR 81 | Ht 66.5 in | Wt 182.0 lb

## 2013-09-06 DIAGNOSIS — I739 Peripheral vascular disease, unspecified: Secondary | ICD-10-CM

## 2013-09-06 DIAGNOSIS — M79609 Pain in unspecified limb: Secondary | ICD-10-CM | POA: Insufficient documentation

## 2013-09-06 NOTE — Addendum Note (Signed)
Addended by: Mena Goes on: 09/06/2013 05:31 PM   Modules accepted: Orders

## 2013-09-06 NOTE — Assessment & Plan Note (Signed)
Her ABIs remained stable in the left side. Her symptoms are tolerable and therefore we have decided to continue with conservative treatment. Certainly if her symptoms progress we could consider arteriography. Given her venous disease and risk for healing if she did require intervention I would hope that we would find something that could be done from an endovascular standpoint. I think she would be at increased risk for surgery given her age and venous insufficiency. I have encouraged her to walk as much as possible. Fortunately she is not a smoker. I've ordered follow up ABIs in 6 months and I'll see her back at that time. She knows to call sooner if she has problems.

## 2013-09-06 NOTE — Progress Notes (Signed)
[facility]  Patient name: Tammy [name] MRN: [id number] DOB: 06/29/1931 Sex: female  REASON FOR VISIT: Follow up of peripheral vascular disease.  HPI: Tammy [name] is a 80 y.o. female who I last saw on 06/07/2013. She had left calf claudication at that time and a fairly short distance. Her ABI at that time was 50% on the left and 100% on the right. By duplex she was noted to have a short segment occlusion of her popliteal artery with two-vessel runoff. The duplex also suggested some mild iliac disease on the left. At that time we recommended conservative treatment but if her symptoms progressed we would consider arteriography. She comes in for a 3 month follow up visit. Of note, she also had evidence of chronic venous insufficiency but no history of DVT or phlebitis.  Since I saw her last she continues to have claudication in her left calf and also to a lesser degree her left thigh. This is brought on by ambulation and relieved with rest. This occurs a fairly short distance of approximately a half a block. She denies any history of rest pain or nonhealing ulcers.  Past Medical History  Diagnosis Date  . BREAST BIOPSY, HX OF 06/10/2006  . HIP PAIN, LEFT 01/31/2007  . HYPERLIPIDEMIA 06/10/2006  . HYPERTENSION 06/10/2006  . NEOPLASMS UNSPEC NATURE BONE SOFT TISSUE&SKIN 05/31/2008  . OSTEOPOROSIS 06/10/2006  . Impaired glucose tolerance   . Breast cyst    Family History  Problem Relation Age of Onset  . Heart disease Mother     Pacemaker  . Heart disease Father   . Asthma Father   . Diabetes Father   . Cancer Maternal Aunt     breast  . Cancer Sister   . Heart disease Sister   . Hyperlipidemia Sister   . Hypertension Sister    SOCIAL HISTORY: History  Substance Use Topics  . Smoking status: Never Smoker   . Smokeless tobacco: Current User    Types: Snuff  . Alcohol Use: No   No Known Allergies Current Outpatient Prescriptions  Medication Sig Dispense  Refill  . alendronate (FOSAMAX) 70 MG tablet TAKE 1 TABLET BY MOUTH EVERY 7 DAYS WITH A FULL GLASS OF WATER ON AN EMPTY STOMACH  12 tablet  3  . ALPRAZolam (XANAX) 0.5 MG tablet TAKE 1 TABLET BY MOUTH TWICE A DAY AS NEEDED  60 tablet  5  . hydrochlorothiazide (HYDRODIURIL) 25 MG tablet Take 1 tablet (25 mg total) by mouth daily.  90 tablet  3  . simvastatin (ZOCOR) 40 MG tablet Take 1 tablet (40 mg total) by mouth at bedtime.  90 tablet  3   No current facility-administered medications for this visit.   REVIEW OF SYSTEMS: Valu.Nieves ] denotes positive finding; [  ] denotes negative finding  CARDIOVASCULAR:  [ ]  chest pain   [ ]  chest pressure   [ ]  palpitations   [ ]  orthopnea   [ ]  dyspnea on exertion   Valu.Nieves ] claudication   [ ]  rest pain   [ ]  DVT   [ ]  phlebitis PULMONARY:   [ ]  productive cough   [ ]  asthma   [ ]  wheezing NEUROLOGIC:   [ ]  weakness  [ ]  paresthesias  [ ]  aphasia  [ ]  amaurosis  [ ]  dizziness HEMATOLOGIC:   [ ]  bleeding problems   [ ]  clotting disorders MUSCULOSKELETAL:  [ ]  joint pain   [ ]  joint  swelling [ ]  leg swelling GASTROINTESTINAL: [ ]   blood in stool  [ ]   hematemesis GENITOURINARY:  [ ]   dysuria  [ ]   hematuria PSYCHIATRIC:  [ ]  history of major depression INTEGUMENTARY:  [ ]  rashes  [ ]  ulcers CONSTITUTIONAL:  [ ]  fever   [ ]  chills  PHYSICAL EXAM: Filed Vitals:   09/06/13 0941  BP: 156/72  Pulse: 81  Height: 5' 6.5" (1.689 m)  Weight: 182 lb (82.555 kg)  SpO2: 99%   Body mass index is 28.94 kg/(m^2). GENERAL: The patient is a well-nourished female, in no acute distress. The vital signs are documented above. CARDIOVASCULAR: There is a regular rate and rhythm. I do not detect carotid bruits. She has palpable femoral pulses. She has a palpable right dorsalis pedis pulse. She has biphasic Doppler signals in the right foot. On the left side I cannot palpate pedal pulses. She has monophasic Doppler signals. She does have moderate bilateral lower extremity  swelling PULMONARY: There is good air exchange bilaterally without wheezing or rales. ABDOMEN: Soft and non-tender with normal pitched bowel sounds.  MUSCULOSKELETAL: There are no major deformities or cyanosis. NEUROLOGIC: No focal weakness or paresthesias are detected. SKIN: There are no ulcers or rashes noted. There are no ischemic ulcers on her feet. PSYCHIATRIC: The patient has a normal affect.  DATA:  I have independently interpreted her arterial Doppler study today which shows an ABI of 100% on the right and 56% on the left. This is stable compared to the study 3 months ago. She has biphasic Doppler signals in the right posterior tibial and dorsalis pedis positions. She has monophasic Doppler signals in the left posterior tibial and dorsalis pedis positions.  MEDICAL ISSUES:   PAD (peripheral artery disease) Her ABIs remained stable in the left side. Her symptoms are tolerable and therefore we have decided to continue with conservative treatment. Certainly if her symptoms progress we could consider arteriography. Given her venous disease and risk for healing if she did require intervention I would hope that we would find something that could be done from an endovascular standpoint. I think she would be at increased risk for surgery given her age and venous insufficiency. I have encouraged her to walk as much as possible. Fortunately she is not a smoker. I've ordered follow up ABIs in 6 months and I'll see her back at that time. She knows to call sooner if she has problems.    Return in about 6 months (around 03/09/2014).  [place] [facility]: [phone]

## 2013-11-13 LAB — HM MAMMOGRAPHY

## 2013-11-21 ENCOUNTER — Encounter: Payer: Self-pay | Admitting: Internal Medicine

## 2013-11-30 ENCOUNTER — Encounter: Payer: Self-pay | Admitting: Internal Medicine

## 2013-12-18 ENCOUNTER — Encounter: Payer: Self-pay | Admitting: Family Medicine

## 2013-12-18 ENCOUNTER — Telehealth: Payer: Self-pay

## 2013-12-18 ENCOUNTER — Ambulatory Visit (INDEPENDENT_AMBULATORY_CARE_PROVIDER_SITE_OTHER): Payer: Commercial Managed Care - HMO | Admitting: Family Medicine

## 2013-12-18 ENCOUNTER — Ambulatory Visit (INDEPENDENT_AMBULATORY_CARE_PROVIDER_SITE_OTHER): Payer: Commercial Managed Care - HMO

## 2013-12-18 VITALS — BP 140/80 | HR 70 | Temp 98.2°F | Wt 180.0 lb

## 2013-12-18 DIAGNOSIS — G4489 Other headache syndrome: Secondary | ICD-10-CM

## 2013-12-18 DIAGNOSIS — R42 Dizziness and giddiness: Secondary | ICD-10-CM

## 2013-12-18 DIAGNOSIS — I10 Essential (primary) hypertension: Secondary | ICD-10-CM

## 2013-12-18 DIAGNOSIS — Z23 Encounter for immunization: Secondary | ICD-10-CM

## 2013-12-18 NOTE — Progress Notes (Signed)
  Garret Reddish, MD Phone: [phone]  Subjective:   Tammy [name] is a 80 y.o. year old very pleasant female patient who presents with the following:  Headache/dizziness with bending over/concerns about hypertension X 2 weeks.   Checking with digital monitor at home over last 2 weeks with top # above 150 and bottom around 90.  Started having afternoon headaches and was worried about blood pressure medicine running out. At baseline, Dizziness with  standing quickly from chair-states has been like this for several years. New symptom is when bending over and then lifting head up. No persistent dizziness and resolves after a few seconds.   HA on front left up to 4/10 intermittent lasting a couple hours, goes away with baby aspirin. Dull ache.   History of intermittent headaches.   Had a syncope workup and hospitalization in March 2014 largely unrevealing. CT scan of head negative, neg serial tropnonins,diastolic dysfunction but otherwise echo normal.  ROS- no chest pain or shortness of breath  Past Medical History- Peripheral arterial disease, HLD, HTN, osteoporosis  Medications- reviewed and updated Current Outpatient Prescriptions  Medication Sig Dispense Refill  . alendronate (FOSAMAX) 70 MG tablet TAKE 1 TABLET BY MOUTH EVERY 7 DAYS WITH A FULL GLASS OF WATER ON AN EMPTY STOMACH 12 tablet 3  . ALPRAZolam (XANAX) 0.5 MG tablet TAKE 1 TABLET BY MOUTH TWICE A DAY AS NEEDED 60 tablet 5  . hydrochlorothiazide (HYDRODIURIL) 25 MG tablet Take 1 tablet (25 mg total) by mouth daily. 90 tablet 3  . simvastatin (ZOCOR) 40 MG tablet Take 1 tablet (40 mg total) by mouth at bedtime. 90 tablet 3   No current facility-administered medications for this visit.    Objective: BP 140/78 mmHg  Pulse 70  Temp(Src) 98.2 F (36.8 C)  Wt 180 lb (81.647 kg) Gen: NAD, resting comfortabl CV: RRR no murmurs rubs or gallops Lungs: CTAB no crackles, wheeze, rhonchi Abdomen:  soft/nontender/nondistended/normal bowel sounds.  Ext: 1+ nonpitting edema Neuro: CN II-XII intact, sensation and reflexes normal throughout, 5/5 muscle strength in bilateral upper and lower extremities. Normal reflexes at knees. Normal finger to nose. Normal rapid alternating movements. Normal romberg. No pronator drift   Assessment/Plan:  Headache/dizziness with bending over/concerns about hypertension Initially, I reassured patient of normal neurological exam. I also told her blood pressure is likely reasonable given age and already has orthostatic symptoms so do not want to push it any lower.I reassured her that headaches likely were not related to BP.  Patient was to follow up with Dr. Raliegh Ip next week for a recheck of BP and on her symptoms.   At the end of the visit, I was thanking patient for her time after reassuring her and she broke down crying. I further inquired and patient admits that the headache issues have started as the holidays approached. She is having a great stressor where 1 daughter is not welcome with another daughter due to a homosexual granddaughter. Patient has already lost a son and wants all 3 remaining children to be able to get along and spend the holidays together. She admits this has started weighing on her when she perceived her headache/dizziness/blood pressure issues. I think a lot of the patient's issues are resolving around stress and less so due to her blood pressure.   Return precautions advised.

## 2013-12-18 NOTE — Patient Instructions (Signed)
Great to meet you.   No changes to your blood pressure medicines at this time as I am worried we will run you too low.   See you back next Tuesday with Dr. Raliegh Ip to check in.   Seek care sooner if your headaches worsened before then. It is ok to take a baby aspirin for your headache as you were.

## 2013-12-18 NOTE — Telephone Encounter (Signed)
Per patient for the last several days she has had a headache and been dizzy.  Today her bp is 176/91.  Pt has been taking her bp for a couple of days and it has been high. Pt is only currently taking the HCTZ.  Advised pt that she needed to be evaluated due to her high blood pressure readings.  Pt verbalized understanding and was transferred to [facility] to make an appt.

## 2013-12-26 ENCOUNTER — Ambulatory Visit (INDEPENDENT_AMBULATORY_CARE_PROVIDER_SITE_OTHER): Payer: Commercial Managed Care - HMO | Admitting: Internal Medicine

## 2013-12-26 ENCOUNTER — Encounter: Payer: Self-pay | Admitting: Internal Medicine

## 2013-12-26 VITALS — BP 128/80 | HR 68 | Temp 98.0°F | Resp 20 | Ht 66.5 in | Wt 177.0 lb

## 2013-12-26 DIAGNOSIS — I739 Peripheral vascular disease, unspecified: Secondary | ICD-10-CM

## 2013-12-26 DIAGNOSIS — I10 Essential (primary) hypertension: Secondary | ICD-10-CM

## 2013-12-26 DIAGNOSIS — E785 Hyperlipidemia, unspecified: Secondary | ICD-10-CM

## 2013-12-26 DIAGNOSIS — M810 Age-related osteoporosis without current pathological fracture: Secondary | ICD-10-CM

## 2013-12-26 LAB — CBC WITH DIFFERENTIAL/PLATELET
BASOS PCT: 0.4 % (ref 0.0–3.0)
Basophils Absolute: 0 10*3/uL (ref 0.0–0.1)
EOS ABS: 0.1 10*3/uL (ref 0.0–0.7)
Eosinophils Relative: 1.6 % (ref 0.0–5.0)
HEMATOCRIT: 42 % (ref 36.0–46.0)
Hemoglobin: 13.7 g/dL (ref 12.0–15.0)
LYMPHS ABS: 3.1 10*3/uL (ref 0.7–4.0)
Lymphocytes Relative: 40.1 % (ref 12.0–46.0)
MCHC: 32.5 g/dL (ref 30.0–36.0)
MCV: 91.5 fl (ref 78.0–100.0)
MONO ABS: 0.7 10*3/uL (ref 0.1–1.0)
Monocytes Relative: 8.6 % (ref 3.0–12.0)
Neutro Abs: 3.8 10*3/uL (ref 1.4–7.7)
Neutrophils Relative %: 49.3 % (ref 43.0–77.0)
PLATELETS: 283 10*3/uL (ref 150.0–400.0)
RBC: 4.59 Mil/uL (ref 3.87–5.11)
RDW: 15.1 % (ref 11.5–15.5)
WBC: 7.7 10*3/uL (ref 4.0–10.5)

## 2013-12-26 LAB — TSH: TSH: 4.81 u[IU]/mL — ABNORMAL HIGH (ref 0.35–4.50)

## 2013-12-26 MED ORDER — ALPRAZOLAM 0.5 MG PO TABS: ORAL_TABLET | ORAL | Status: DC

## 2013-12-26 NOTE — Progress Notes (Signed)
Subjective:    Patient ID: Star Age, female    DOB: 1932-01-01, 80 y.o.   MRN: [id number]  HPI 80 year old patient who is seen today for her six-month follow-up.  She was seen last month with worsening headaches and blood pressure concerns.  Headaches are still an issue, but somewhat improved.  Her blood pressure has been well controlled.  She has been under some situational family stress. She also complains of some fatigue for about 1 month.  Past Medical History  Diagnosis Date  . BREAST BIOPSY, HX OF 06/10/2006  . HIP PAIN, LEFT 01/31/2007  . HYPERLIPIDEMIA 06/10/2006  . HYPERTENSION 06/10/2006  . NEOPLASMS UNSPEC NATURE BONE SOFT TISSUE&SKIN 05/31/2008  . OSTEOPOROSIS 06/10/2006  . Impaired glucose tolerance   . Breast cyst     History   Social History  . Marital Status: Married    Spouse Name: N/A    Number of Children: 4  . Years of Education: N/A   Occupational History  . Not on file.   Social History Main Topics  . Smoking status: Never Smoker   . Smokeless tobacco: Current User    Types: Snuff  . Alcohol Use: No  . Drug Use: No  . Sexual Activity: Yes   Other Topics Concern  . Not on file   Social History Narrative    Past Surgical History  Procedure Laterality Date  . Appendectomy    . Abdominal hysterectomy    . Breast surgery      bx x2  . Cholecystectomy  1980    Family History  Problem Relation Age of Onset  . Heart disease Mother     Pacemaker  . Heart disease Father   . Asthma Father   . Diabetes Father   . Cancer Maternal Aunt     breast  . Cancer Sister   . Heart disease Sister   . Hyperlipidemia Sister   . Hypertension Sister     No Known Allergies  Current Outpatient Prescriptions on File Prior to Visit  Medication Sig Dispense Refill  . alendronate (FOSAMAX) 70 MG tablet TAKE 1 TABLET BY MOUTH EVERY 7 DAYS WITH A FULL GLASS OF WATER ON AN EMPTY STOMACH 12 tablet 3  . ALPRAZolam (XANAX) 0.5 MG tablet TAKE 1 TABLET BY MOUTH  TWICE A DAY AS NEEDED 60 tablet 5  . hydrochlorothiazide (HYDRODIURIL) 25 MG tablet Take 1 tablet (25 mg total) by mouth daily. 90 tablet 3  . simvastatin (ZOCOR) 40 MG tablet Take 1 tablet (40 mg total) by mouth at bedtime. 90 tablet 3   No current facility-administered medications on file prior to visit.    BP 128/80 mmHg  Pulse 68  Temp(Src) 98 F (36.7 C) (Oral)  Resp 20  Ht 5' 6.5" (1.689 m)  Wt 177 lb (80.287 kg)  BMI 28.14 kg/m2  SpO2 96%      Review of Systems  Constitutional: Positive for fatigue.  HENT: Negative for congestion, dental problem, hearing loss, rhinorrhea, sinus pressure, sore throat and tinnitus.   Eyes: Negative for pain, discharge and visual disturbance.  Respiratory: Negative for cough and shortness of breath.   Cardiovascular: Negative for chest pain, palpitations and leg swelling.  Gastrointestinal: Negative for nausea, vomiting, abdominal pain, diarrhea, constipation, blood in stool and abdominal distention.  Genitourinary: Negative for dysuria, urgency, frequency, hematuria, flank pain, vaginal bleeding, vaginal discharge, difficulty urinating, vaginal pain and pelvic pain.  Musculoskeletal: Negative for joint swelling, arthralgias and gait problem.  Skin:  Negative for rash.  Neurological: Positive for weakness and headaches. Negative for dizziness, syncope, speech difficulty and numbness.  Hematological: Negative for adenopathy.  Psychiatric/Behavioral: Negative for behavioral problems, dysphoric mood and agitation. The patient is nervous/anxious.        Objective:   Physical Exam  Constitutional: She is oriented to person, place, and time. She appears well-developed and well-nourished.  Blood pressure 122/76  HENT:  Head: Normocephalic.  Right Ear: External ear normal.  Left Ear: External ear normal.  Mouth/Throat: Oropharynx is clear and moist.  Eyes: Conjunctivae and EOM are normal. Pupils are equal, round, and reactive to light.    Neck: Normal range of motion. Neck supple. No thyromegaly present.  Cardiovascular: Normal rate, regular rhythm, normal heart sounds and intact distal pulses.   Pulmonary/Chest: Effort normal and breath sounds normal.  Abdominal: Soft. Bowel sounds are normal. She exhibits no mass. There is no tenderness.  Musculoskeletal: Normal range of motion. She exhibits edema.  Lymphadenopathy:    She has no cervical adenopathy.  Neurological: She is alert and oriented to person, place, and time.  Skin: Skin is warm and dry. No rash noted.  Psychiatric: She has a normal mood and affect. Her behavior is normal.          Assessment & Plan:   Hypertension, well-controlled Fatigue.  We'll check screening lab Muscle contraction headaches Situational stress PAD, stable  CPX 6 months

## 2013-12-26 NOTE — Progress Notes (Signed)
Pre visit review using our clinic review tool, if applicable. No additional management support is needed unless otherwise documented below in the visit note. 

## 2013-12-26 NOTE — Patient Instructions (Signed)
Limit your sodium (Salt) intake  Please check your blood pressure on a regular basis.  If it is consistently greater than 150/90, please make an office appointment.  Return in 6 months for follow-up   

## 2013-12-27 LAB — COMPREHENSIVE METABOLIC PANEL
ALK PHOS: 53 U/L (ref 39–117)
ALT: 16 U/L (ref 0–35)
AST: 26 U/L (ref 0–37)
Albumin: 4.2 g/dL (ref 3.5–5.2)
BUN: 41 mg/dL — ABNORMAL HIGH (ref 6–23)
CO2: 23 mEq/L (ref 19–32)
Calcium: 9.3 mg/dL (ref 8.4–10.5)
Chloride: 105 mEq/L (ref 96–112)
Creatinine, Ser: 1.5 mg/dL — ABNORMAL HIGH (ref 0.4–1.2)
GFR: 36.89 mL/min — ABNORMAL LOW (ref >60.00)
Glucose, Bld: 121 mg/dL — ABNORMAL HIGH (ref 70–99)
Potassium: 4.6 mEq/L (ref 3.5–5.1)
SODIUM: 139 meq/L (ref 135–145)
TOTAL PROTEIN: 7.3 g/dL (ref 6.0–8.3)
Total Bilirubin: 0.9 mg/dL (ref 0.2–1.2)

## 2013-12-27 LAB — LIPID PANEL
CHOL/HDL RATIO: 3
CHOLESTEROL: 160 mg/dL (ref 0–200)
HDL: 54.7 mg/dL (ref >39.00)
LDL CALC: 82 mg/dL (ref 0–99)
NonHDL: 105.3
Triglycerides: 117 mg/dL (ref 0.0–149.0)
VLDL: 23.4 mg/dL (ref 0.0–40.0)

## 2013-12-28 ENCOUNTER — Telehealth: Payer: Self-pay | Admitting: Internal Medicine

## 2013-12-28 ENCOUNTER — Ambulatory Visit: Payer: Medicare PPO | Admitting: Internal Medicine

## 2013-12-28 NOTE — Telephone Encounter (Signed)
emmi mailed  °

## 2014-01-22 ENCOUNTER — Ambulatory Visit (INDEPENDENT_AMBULATORY_CARE_PROVIDER_SITE_OTHER): Payer: Commercial Managed Care - HMO | Admitting: Internal Medicine

## 2014-01-22 ENCOUNTER — Encounter: Payer: Self-pay | Admitting: Internal Medicine

## 2014-01-22 DIAGNOSIS — M5440 Lumbago with sciatica, unspecified side: Secondary | ICD-10-CM

## 2014-01-22 DIAGNOSIS — E785 Hyperlipidemia, unspecified: Secondary | ICD-10-CM

## 2014-01-22 DIAGNOSIS — I10 Essential (primary) hypertension: Secondary | ICD-10-CM

## 2014-01-22 MED ORDER — METHYLPREDNISOLONE ACETATE 80 MG/ML IJ SUSP
80.0000 mg | Freq: Once | INTRAMUSCULAR | Status: AC
  Administered 2014-01-22: 80 mg via INTRAMUSCULAR

## 2014-01-22 MED ORDER — HYDROCODONE-ACETAMINOPHEN 5-325 MG PO TABS: 1.0000 | ORAL_TABLET | Freq: Four times a day (QID) | ORAL | Status: DC | PRN

## 2014-01-22 MED ORDER — ALPRAZOLAM 0.5 MG PO TABS: ORAL_TABLET | ORAL | Status: DC

## 2014-01-22 NOTE — Progress Notes (Signed)
Pre visit review using our clinic review tool, if applicable. No additional management support is needed unless otherwise documented below in the visit note. 

## 2014-01-22 NOTE — Patient Instructions (Signed)
Call or return to clinic prn if these symptoms worsen or fail to improve as anticipated.  Back Pain, Adult Low back pain is very common. About 1 in 5 people have back pain.The cause of low back pain is rarely dangerous. The pain often gets better over time.About half of people with a sudden onset of back pain feel better in just 2 weeks. About 8 in 10 people feel better by 6 weeks.  CAUSES Some common causes of back pain include:  Strain of the muscles or ligaments supporting the spine.  Wear and tear (degeneration) of the spinal discs.  Arthritis.  Direct injury to the back. DIAGNOSIS Most of the time, the direct cause of low back pain is not known.However, back pain can be treated effectively even when the exact cause of the pain is unknown.Answering your caregiver's questions about your overall health and symptoms is one of the most accurate ways to make sure the cause of your pain is not dangerous. If your caregiver needs more information, he or she may order lab work or imaging tests (X-rays or MRIs).However, even if imaging tests show changes in your back, this usually does not require surgery. HOME CARE INSTRUCTIONS For many people, back pain returns.Since low back pain is rarely dangerous, it is often a condition that people can learn to [facility] their own.   Remain active. It is stressful on the back to sit or stand in one place. Do not sit, drive, or stand in one place for more than 30 minutes at a time. Take short walks on level surfaces as soon as pain allows.Try to increase the length of time you walk each day.  Do not stay in bed.Resting more than 1 or 2 days can delay your recovery.  Do not avoid exercise or work.Your body is made to move.It is not dangerous to be active, even though your back may hurt.Your back will likely heal faster if you return to being active before your pain is gone.  Pay attention to your body when you bend and lift. Many people have less  discomfortwhen lifting if they bend their knees, keep the load close to their bodies,and avoid twisting. Often, the most comfortable positions are those that put less stress on your recovering back.  Find a comfortable position to sleep. Use a firm mattress and lie on your side with your knees slightly bent. If you lie on your back, put a pillow under your knees.  Only take over-the-counter or prescription medicines as directed by your caregiver. Over-the-counter medicines to reduce pain and inflammation are often the most helpful.Your caregiver may prescribe muscle relaxant drugs.These medicines help dull your pain so you can more quickly return to your normal activities and healthy exercise.  Put ice on the injured area.  Put ice in a plastic bag.  Place a towel between your skin and the bag.  Leave the ice on for 15-20 minutes, 03-04 times a day for the first 2 to 3 days. After that, ice and heat may be alternated to reduce pain and spasms.  Ask your caregiver about trying back exercises and gentle massage. This may be of some benefit.  Avoid feeling anxious or stressed.Stress increases muscle tension and can worsen back pain.It is important to recognize when you are anxious or stressed and learn ways to manage it.Exercise is a great option. SEEK MEDICAL CARE IF:  You have pain that is not relieved with rest or medicine.  You have pain that does not  improve in 1 week.  You have new symptoms.  You are generally not feeling well. SEEK IMMEDIATE MEDICAL CARE IF:   You have pain that radiates from your back into your legs.  You develop new bowel or bladder control problems.  You have unusual weakness or numbness in your arms or legs.  You develop nausea or vomiting.  You develop abdominal pain.  You feel faint. Document Released: 01/12/2005 Document Revised: 07/14/2011 Document Reviewed: 05/16/2013 [facility] Patient Information 2015 [place], [place]. This information is  not intended to replace advice given to you by your health care provider. Make sure you discuss any questions you have with your health care provider.

## 2014-01-22 NOTE — Progress Notes (Signed)
Subjective:    Patient ID: Tammy [name] Age, female    DOB: 1931-11-16, 80 y.o.   MRN: [id number]  HPI  80 year old patient who stable medical problems include hypertension, dyslipidemia and PAD.  She presents with a three-day history of left lumbar back pain.  She also describes some radiation of the pain to the left thigh associated with some numbness.  No motor weakness.  No prior history of low back pain.  Pain is aggravated by movement.  Once she is up, she is able to ambulate without too much difficulty.  No history of trauma.  No fever or other constitutional complaints.  No recent falls  Past Medical History  Diagnosis Date  . BREAST BIOPSY, HX OF 06/10/2006  . HIP PAIN, LEFT 01/31/2007  . HYPERLIPIDEMIA 06/10/2006  . HYPERTENSION 06/10/2006  . NEOPLASMS UNSPEC NATURE BONE SOFT TISSUE&SKIN 05/31/2008  . OSTEOPOROSIS 06/10/2006  . Impaired glucose tolerance   . Breast cyst     History   Social History  . Marital Status: Married    Spouse Name: N/A    Number of Children: 4  . Years of Education: N/A   Occupational History  . Not on file.   Social History Main Topics  . Smoking status: Never Smoker   . Smokeless tobacco: Current User    Types: Snuff  . Alcohol Use: No  . Drug Use: No  . Sexual Activity: Yes   Other Topics Concern  . Not on file   Social History Narrative    Past Surgical History  Procedure Laterality Date  . Appendectomy    . Abdominal hysterectomy    . Breast surgery      bx x2  . Cholecystectomy  1980    Family History  Problem Relation Age of Onset  . Heart disease Mother     Pacemaker  . Heart disease Father   . Asthma Father   . Diabetes Father   . Cancer Maternal Aunt     breast  . Cancer Sister   . Heart disease Sister   . Hyperlipidemia Sister   . Hypertension Sister     No Known Allergies  Current Outpatient Prescriptions on File Prior to Visit  Medication Sig Dispense Refill  . alendronate (FOSAMAX) 70 MG tablet TAKE 1  TABLET BY MOUTH EVERY 7 DAYS WITH A FULL GLASS OF WATER ON AN EMPTY STOMACH 12 tablet 3  . ALPRAZolam (XANAX) 0.5 MG tablet TAKE 1 TABLET BY MOUTH TWICE A DAY AS NEEDED 60 tablet 5  . hydrochlorothiazide (HYDRODIURIL) 25 MG tablet Take 1 tablet (25 mg total) by mouth daily. 90 tablet 3  . simvastatin (ZOCOR) 40 MG tablet Take 1 tablet (40 mg total) by mouth at bedtime. 90 tablet 3   No current facility-administered medications on file prior to visit.    BP 120/80 mmHg  Pulse 64  Temp(Src) 97.9 F (36.6 C) (Oral)  Resp 20  Ht 5' 6.5" (1.689 m)  Wt 180 lb (81.647 kg)  BMI 28.62 kg/m2  SpO2 98%      Review of Systems  Constitutional: Negative.   HENT: Negative for congestion, dental problem, hearing loss, rhinorrhea, sinus pressure, sore throat and tinnitus.   Eyes: Negative for pain, discharge and visual disturbance.  Respiratory: Negative for cough and shortness of breath.   Cardiovascular: Negative for chest pain, palpitations and leg swelling.  Gastrointestinal: Negative for nausea, vomiting, abdominal pain, diarrhea, constipation, blood in stool and abdominal distention.  Genitourinary: Negative for  dysuria, urgency, frequency, hematuria, flank pain, vaginal bleeding, vaginal discharge, difficulty urinating, vaginal pain and pelvic pain.  Musculoskeletal: Positive for back pain. Negative for joint swelling, arthralgias and gait problem.  Skin: Negative for rash.  Neurological: Negative for dizziness, syncope, speech difficulty, weakness, numbness and headaches.  Hematological: Negative for adenopathy.  Psychiatric/Behavioral: Negative for behavioral problems, dysphoric mood and agitation. The patient is not nervous/anxious.        Objective:   Physical Exam  Constitutional: She appears well-developed and well-nourished. No distress.  Cardiovascular:  The right dorsalis pedis pulses full other pedal pulses not easily palpable  Musculoskeletal:  Slight tenderness in the  left lumbar region Straight leg test bilaterally and are to aggravate the lumbar pain Ankle flexion and extension normal          Assessment & Plan:   Acute low back pain.  Will treat with Depo-Medrol.  Rest, heat and analgesics Hypertension Dyslipidemia PAD

## 2014-02-23 ENCOUNTER — Encounter: Payer: Self-pay | Admitting: Family Medicine

## 2014-02-23 ENCOUNTER — Encounter (HOSPITAL_COMMUNITY): Payer: Self-pay

## 2014-02-23 ENCOUNTER — Inpatient Hospital Stay (HOSPITAL_COMMUNITY)
Admission: EM | Admit: 2014-02-23 | Discharge: 2014-02-26 | DRG: 175 | Disposition: A | Discharge status: Home / Self Care | Payer: Commercial Managed Care - HMO | Attending: Internal Medicine | Admitting: Internal Medicine

## 2014-02-23 ENCOUNTER — Emergency Department (HOSPITAL_COMMUNITY): Payer: Commercial Managed Care - HMO

## 2014-02-23 ENCOUNTER — Ambulatory Visit (INDEPENDENT_AMBULATORY_CARE_PROVIDER_SITE_OTHER): Payer: Commercial Managed Care - HMO | Admitting: Family Medicine

## 2014-02-23 VITALS — BP 116/60 | HR 65 | Temp 99.6°F

## 2014-02-23 DIAGNOSIS — F17220 Nicotine dependence, chewing tobacco, uncomplicated: Secondary | ICD-10-CM | POA: Diagnosis present

## 2014-02-23 DIAGNOSIS — I272 Other secondary pulmonary hypertension: Secondary | ICD-10-CM | POA: Diagnosis not present

## 2014-02-23 DIAGNOSIS — E785 Hyperlipidemia, unspecified: Secondary | ICD-10-CM

## 2014-02-23 DIAGNOSIS — R0602 Shortness of breath: Secondary | ICD-10-CM

## 2014-02-23 DIAGNOSIS — Z9049 Acquired absence of other specified parts of digestive tract: Secondary | ICD-10-CM | POA: Diagnosis present

## 2014-02-23 DIAGNOSIS — I471 Supraventricular tachycardia: Secondary | ICD-10-CM | POA: Diagnosis present

## 2014-02-23 DIAGNOSIS — I071 Rheumatic tricuspid insufficiency: Secondary | ICD-10-CM

## 2014-02-23 DIAGNOSIS — I10 Essential (primary) hypertension: Secondary | ICD-10-CM | POA: Diagnosis not present

## 2014-02-23 DIAGNOSIS — Z9181 History of falling: Secondary | ICD-10-CM | POA: Diagnosis not present

## 2014-02-23 DIAGNOSIS — I2699 Other pulmonary embolism without acute cor pulmonale: Principal | ICD-10-CM | POA: Diagnosis present

## 2014-02-23 DIAGNOSIS — R791 Abnormal coagulation profile: Secondary | ICD-10-CM | POA: Diagnosis not present

## 2014-02-23 DIAGNOSIS — R079 Chest pain, unspecified: Secondary | ICD-10-CM

## 2014-02-23 DIAGNOSIS — J181 Lobar pneumonia, unspecified organism: Secondary | ICD-10-CM | POA: Diagnosis not present

## 2014-02-23 DIAGNOSIS — M810 Age-related osteoporosis without current pathological fracture: Secondary | ICD-10-CM | POA: Diagnosis present

## 2014-02-23 DIAGNOSIS — N183 Chronic kidney disease, stage 3 (moderate): Secondary | ICD-10-CM | POA: Diagnosis not present

## 2014-02-23 DIAGNOSIS — I129 Hypertensive chronic kidney disease with stage 1 through stage 4 chronic kidney disease, or unspecified chronic kidney disease: Secondary | ICD-10-CM | POA: Diagnosis present

## 2014-02-23 DIAGNOSIS — R509 Fever, unspecified: Secondary | ICD-10-CM | POA: Diagnosis not present

## 2014-02-23 DIAGNOSIS — I4891 Unspecified atrial fibrillation: Secondary | ICD-10-CM | POA: Diagnosis not present

## 2014-02-23 DIAGNOSIS — N179 Acute kidney failure, unspecified: Secondary | ICD-10-CM | POA: Diagnosis not present

## 2014-02-23 DIAGNOSIS — E876 Hypokalemia: Secondary | ICD-10-CM | POA: Diagnosis present

## 2014-02-23 DIAGNOSIS — Z7901 Long term (current) use of anticoagulants: Secondary | ICD-10-CM | POA: Diagnosis not present

## 2014-02-23 DIAGNOSIS — I499 Cardiac arrhythmia, unspecified: Secondary | ICD-10-CM

## 2014-02-23 DIAGNOSIS — R55 Syncope and collapse: Secondary | ICD-10-CM

## 2014-02-23 DIAGNOSIS — I739 Peripheral vascular disease, unspecified: Secondary | ICD-10-CM

## 2014-02-23 DIAGNOSIS — Z7982 Long term (current) use of aspirin: Secondary | ICD-10-CM

## 2014-02-23 DIAGNOSIS — Z853 Personal history of malignant neoplasm of breast: Secondary | ICD-10-CM | POA: Diagnosis not present

## 2014-02-23 DIAGNOSIS — Z90710 Acquired absence of both cervix and uterus: Secondary | ICD-10-CM | POA: Diagnosis not present

## 2014-02-23 DIAGNOSIS — I270 Primary pulmonary hypertension: Secondary | ICD-10-CM | POA: Diagnosis not present

## 2014-02-23 DIAGNOSIS — J189 Pneumonia, unspecified organism: Secondary | ICD-10-CM

## 2014-02-23 DIAGNOSIS — R7989 Other specified abnormal findings of blood chemistry: Secondary | ICD-10-CM | POA: Insufficient documentation

## 2014-02-23 DIAGNOSIS — R05 Cough: Secondary | ICD-10-CM | POA: Diagnosis not present

## 2014-02-23 DIAGNOSIS — R531 Weakness: Secondary | ICD-10-CM | POA: Diagnosis not present

## 2014-02-23 DIAGNOSIS — R059 Cough, unspecified: Secondary | ICD-10-CM

## 2014-02-23 LAB — BASIC METABOLIC PANEL
ANION GAP: 10 (ref 5–15)
BUN: 38 mg/dL — ABNORMAL HIGH (ref 6–23)
CALCIUM: 8.4 mg/dL (ref 8.4–10.5)
CO2: 27 mmol/L (ref 19–32)
CREATININE: 1.47 mg/dL — AB (ref 0.50–1.10)
Chloride: 102 mmol/L (ref 96–112)
GFR calc non Af Amer: 32 mL/min — ABNORMAL LOW (ref >90)
GFR, EST AFRICAN AMERICAN: 38 mL/min — AB (ref >90)
Glucose, Bld: 159 mg/dL — ABNORMAL HIGH (ref 70–99)
POTASSIUM: 3.9 mmol/L (ref 3.5–5.1)
SODIUM: 139 mmol/L (ref 135–145)

## 2014-02-23 LAB — PROTIME-INR
INR: 1.21 (ref 0.00–1.49)
Prothrombin Time: 15.4 seconds — ABNORMAL HIGH (ref 11.6–15.2)

## 2014-02-23 LAB — CBC
HEMATOCRIT: 35.9 % — AB (ref 36.0–46.0)
Hemoglobin: 12.2 g/dL (ref 12.0–15.0)
MCH: 30.3 pg (ref 26.0–34.0)
MCHC: 34 g/dL (ref 30.0–36.0)
MCV: 89.1 fL (ref 78.0–100.0)
PLATELETS: 319 10*3/uL (ref 150–400)
RBC: 4.03 MIL/uL (ref 3.87–5.11)
RDW: 14.9 % (ref 11.5–15.5)
WBC: 10.7 10*3/uL — ABNORMAL HIGH (ref 4.0–10.5)

## 2014-02-23 LAB — D-DIMER, QUANTITATIVE (NOT AT ARMC): D-Dimer, Quant: 2.85 ug/mL-FEU — ABNORMAL HIGH (ref 0.00–0.48)

## 2014-02-23 LAB — MAGNESIUM: Magnesium: 1.6 mg/dL (ref 1.5–2.5)

## 2014-02-23 LAB — TROPONIN I: Troponin I: <0.03 ng/mL (ref <0.031)

## 2014-02-23 LAB — TSH: TSH: 4.017 u[IU]/mL (ref 0.350–4.500)

## 2014-02-23 LAB — I-STAT CG4 LACTIC ACID, ED: Lactic Acid, Venous: 1.37 mmol/L (ref 0.5–2.0)

## 2014-02-23 LAB — I-STAT TROPONIN, ED: Troponin i, poc: 0.02 ng/mL (ref 0.00–0.08)

## 2014-02-23 MED ORDER — DILTIAZEM HCL 100 MG IV SOLR
5.0000 mg/h | INTRAVENOUS | Status: DC
  Administered 2014-02-23: 5 mg/h via INTRAVENOUS
  Filled 2014-02-23 (×2): qty 100

## 2014-02-23 MED ORDER — SODIUM CHLORIDE 0.9 % IV SOLN
INTRAVENOUS | Status: DC
  Administered 2014-02-23: 23:00:00 via INTRAVENOUS

## 2014-02-23 MED ORDER — HEPARIN (PORCINE) IN NACL 100-0.45 UNIT/ML-% IJ SOLN
1250.0000 [IU]/h | INTRAMUSCULAR | Status: DC
  Administered 2014-02-23: 1100 [IU]/h via INTRAVENOUS
  Filled 2014-02-23 (×3): qty 250

## 2014-02-23 MED ORDER — SIMVASTATIN 40 MG PO TABS
40.0000 mg | ORAL_TABLET | Freq: Every day | ORAL | Status: DC
  Filled 2014-02-23 (×2): qty 1

## 2014-02-23 MED ORDER — DEXTROSE 5 % IV SOLN: 5.0000 mg/h | Freq: Once | INTRAVENOUS | Status: DC

## 2014-02-23 MED ORDER — ALPRAZOLAM 0.5 MG PO TABS: 0.5000 mg | ORAL_TABLET | Freq: Every evening | ORAL | Status: DC | PRN

## 2014-02-23 MED ORDER — DEXTROSE 5 % IV SOLN
500.0000 mg | INTRAVENOUS | Status: DC
Start: 2014-02-23 — End: 2014-02-26
  Administered 2014-02-23 – 2014-02-25 (×3): 500 mg via INTRAVENOUS
  Filled 2014-02-23 (×4): qty 500

## 2014-02-23 MED ORDER — AZITHROMYCIN 500 MG IV SOLR
500.0000 mg | Freq: Once | INTRAVENOUS | Status: DC
  Filled 2014-02-23: qty 500

## 2014-02-23 MED ORDER — SODIUM CHLORIDE 0.9 % IV BOLUS (SEPSIS)
1000.0000 mL | Freq: Once | INTRAVENOUS | Status: AC
  Administered 2014-02-23: 1000 mL via INTRAVENOUS

## 2014-02-23 MED ORDER — ASPIRIN EC 81 MG PO TBEC
81.0000 mg | DELAYED_RELEASE_TABLET | Freq: Every day | ORAL | Status: DC
  Administered 2014-02-24 – 2014-02-26 (×3): 81 mg via ORAL
  Filled 2014-02-23 (×3): qty 1

## 2014-02-23 MED ORDER — DEXTROSE 5 % IV SOLN
1.0000 g | Freq: Once | INTRAVENOUS | Status: AC
  Administered 2014-02-23: 1 g via INTRAVENOUS
  Filled 2014-02-23: qty 10

## 2014-02-23 MED ORDER — HEPARIN BOLUS VIA INFUSION
4000.0000 [IU] | Freq: Once | INTRAVENOUS | Status: AC
  Administered 2014-02-23: 4000 [IU] via INTRAVENOUS
  Filled 2014-02-23: qty 4000

## 2014-02-23 MED ORDER — DEXTROSE 5 % IV SOLN
1.0000 g | INTRAVENOUS | Status: DC
Start: 2014-02-23 — End: 2014-02-26
  Administered 2014-02-24 – 2014-02-25 (×3): 1 g via INTRAVENOUS
  Filled 2014-02-23 (×4): qty 10

## 2014-02-23 MED ORDER — DILTIAZEM HCL 25 MG/5ML IV SOLN
10.0000 mg | Freq: Once | INTRAVENOUS | Status: AC
  Administered 2014-02-23: 10 mg via INTRAVENOUS
  Filled 2014-02-23: qty 5

## 2014-02-23 NOTE — ED Notes (Signed)
Pt presents from PCP with report of generalized weakness, dry cough.  Pt reports syncopal episode x 1 today.

## 2014-02-23 NOTE — Progress Notes (Signed)
Pre visit review using our clinic review tool, if applicable. No additional management support is needed unless otherwise documented below in the visit note. 

## 2014-02-23 NOTE — H&P (Signed)
[facility] History and Physical  Patient: Tammy [name]  MRN: [id number]  DOB: 06/22/1931  DOS: the patient was seen and examined on 02/23/2014 PCP: [name], MD  Chief Complaint: Passing out episode cough and shortness of breath  HPI: Tammy [name] is a 81 y.o. female with Past medical history of hypertension, dyslipidemia, osteoporosis. The patient presented with complaints of cough, shortness of breath and passing out episode. Patient mentions that on Wednesday when she was sleeping she woke up in the middle of the night with sudden onset of right-sided chest/back pain. She felt the pain was intense and was located in her ribs and was making her hard to breathe. After a while the pain improved but it did not resolve completely and she continued to have the pain throughout the day. Later on she started developing a dry cough without any expectoration. She also had some runny nose. She felt chills but no fever. Her pain resolved later on. On Friday when she was trying to see her doctor for the above mentioned complaint and was trying to wear her clothes, and bend forward, she felt dizzy lightheaded and passed out. She was in the bed and denies any head injury and neck injury. She did not have any chest pain chest heaviness chest tightness. Before this event in late December she had complaints of lower back pain which was treated with steroid injection, later on she had some pain in her hip on the left side which also resolved. She mentions she has been having generalized weakness and fatigue for last 2 weeks and has been not been able to do her regular household chores. She is up and about but has not been able to do her usual activity. She denies any diarrhea, burning urination, nausea, vomiting, focal deficit, changes in her medication. She has a history of peripheral vascular disease, for which she has seen vascular in the past and recommended conservative management. She has rest  pain but since last 2 weeks her main complaint is fatigue and tiredness.  The patient is coming from home And at her baseline independent for most of her ADL.  Review of Systems: as mentioned in the history of present illness.  A Comprehensive review of the other systems is negative.  Past Medical History  Diagnosis Date  . BREAST BIOPSY, HX OF 06/10/2006  . HIP PAIN, LEFT 01/31/2007  . HYPERLIPIDEMIA 06/10/2006  . HYPERTENSION 06/10/2006  . NEOPLASMS UNSPEC NATURE BONE SOFT TISSUE&SKIN 05/31/2008  . OSTEOPOROSIS 06/10/2006  . Impaired glucose tolerance   . Breast cyst    Past Surgical History  Procedure Laterality Date  . Appendectomy    . Abdominal hysterectomy    . Breast surgery      bx x2  . Cholecystectomy  1980   Social History:  reports that she has never smoked. Her smokeless tobacco use includes Snuff. She reports that she does not drink alcohol or use illicit drugs.  No Known Allergies  Family History  Problem Relation Age of Onset  . Heart disease Mother     Pacemaker  . Heart disease Father   . Asthma Father   . Diabetes Father   . Cancer Maternal Aunt     breast  . Cancer Sister   . Heart disease Sister   . Hyperlipidemia Sister   . Hypertension Sister     Prior to Admission medications   Medication Sig Start Date End Date Taking? Authorizing Provider  alendronate (FOSAMAX) 70 MG  tablet TAKE 1 TABLET BY MOUTH EVERY 7 DAYS WITH A FULL GLASS OF WATER ON AN EMPTY STOMACH Patient taking differently: Take 70 mg by mouth every Wednesday. TAKE 1 TABLET BY MOUTH EVERY 7 DAYS WITH A FULL GLASS OF WATER ON AN EMPTY STOMACH 06/28/13  Yes Marletta Lor, MD  ALPRAZolam Duanne Moron) 0.5 MG tablet TAKE 1 TABLET BY MOUTH TWICE A DAY AS NEEDED Patient taking differently: Take 0.5 mg by mouth at bedtime. TAKE 1 TABLET BY MOUTH TWICE A DAY AS NEEDED 01/22/14  Yes Marletta Lor, MD  hydrochlorothiazide (HYDRODIURIL) 25 MG tablet Take 1 tablet (25 mg total) by mouth daily.  06/28/13  Yes Marletta Lor, MD  simvastatin (ZOCOR) 40 MG tablet Take 1 tablet (40 mg total) by mouth at bedtime. 06/28/13  Yes Marletta Lor, MD  aspirin 325 MG EC tablet Take 325 mg by mouth every 6 (six) hours as needed for pain.    Historical Provider, MD  HYDROcodone-acetaminophen (NORCO/VICODIN) 5-325 MG per tablet Take 1 tablet by mouth every 6 (six) hours as needed for moderate pain. 01/22/14   Marletta Lor, MD    Physical Exam: Filed Vitals:   02/23/14 2120 02/23/14 2120 02/23/14 2130 02/23/14 2203  BP:    116/38  Pulse:   97 101  Temp:  97.8 F (36.6 C)  98.1 F (36.7 C)  TempSrc:  Oral  Oral  Resp: 20  18 19   Height:    5\' 7"  (1.702 m)  Weight:    80 kg (176 lb 5.9 oz)  SpO2:   98% 94%    General: Alert, Awake and Oriented to Time, Place and Person. Appear in mild distress Eyes: PERRL ENT: Oral Mucosa clear moist. Neck: mild JVD Cardiovascular: S1 and S2 Present, no Murmur, Peripheral Pulses Present Respiratory: Bilateral Air entry equal and Decreased, right-sided Crackles, no wheezes Abdomen: Bowel Sound present, Soft and non tender Skin: no Rash Extremities: Bilateral Pedal edema, chronic calf tenderness Neurologic: Grossly no focal neuro deficit.  Labs on Admission:  CBC:  Recent Labs Lab 02/23/14 1519  WBC 10.7*  HGB 12.2  HCT 35.9*  MCV 89.1  PLT 319    CMP     Component Value Date/Time   NA 139 02/23/2014 1519   K 3.9 02/23/2014 1519   CL 102 02/23/2014 1519   CO2 27 02/23/2014 1519   GLUCOSE 159* 02/23/2014 1519   BUN 38* 02/23/2014 1519   CREATININE 1.47* 02/23/2014 1519   CALCIUM 8.4 02/23/2014 1519   PROT 7.3 12/26/2013 1120   ALBUMIN 4.2 12/26/2013 1120   AST 26 12/26/2013 1120   ALT 16 12/26/2013 1120   ALKPHOS 53 12/26/2013 1120   BILITOT 0.9 12/26/2013 1120   GFRNONAA 32* 02/23/2014 1519   GFRAA 38* 02/23/2014 1519    No results for input(s): LIPASE, AMYLASE in the last 168 hours.  No results for input(s):  CKTOTAL, CKMB, CKMBINDEX, TROPONINI in the last 168 hours. BNP (last 3 results) No results for input(s): PROBNP in the last 8760 hours.  Radiological Exams on Admission: Dg Chest 2 View  02/23/2014   CLINICAL DATA:  Fever and dry cough for a few days.  EXAM: CHEST  2 VIEW  COMPARISON:  04/02/2012  FINDINGS: There is airspace consolidation in the right upper lobe posteriorly, also in the right middle lobe anteriorly. This likely represents pneumonia given the clinical setting. There are no effusions. There is marked hyperinflation.  IMPRESSION: Patchy consolidation in the right  upper lobe and right middle lobe, likely pneumonia in this clinical setting. Recommend follow-up radiographs after treatment to confirm complete clearing.   Electronically Signed   By: Andreas Newport M.D.   On: 02/23/2014 19:09   Ct Head Wo Contrast  02/23/2014   CLINICAL DATA:  Generalized weakness, syncope  EXAM: CT HEAD WITHOUT CONTRAST  TECHNIQUE: Contiguous axial images were obtained from the base of the skull through the vertex without intravenous contrast.  COMPARISON:  04/02/2012  FINDINGS: No skull fracture is noted. Paranasal sinuses and mastoid air cells are unremarkable. No intracranial hemorrhage, mass effect or midline shift. No acute cortical infarction. No mass lesion is noted on this unenhanced scan. The gray and white-matter differentiation is preserved. Mild cerebral atrophy is stable. Atherosclerotic calcifications of carotid siphon are noted.  IMPRESSION: No acute intracranial abnormality.  Mild cerebral atrophy.   Electronically Signed   By: Lahoma Crocker M.D.   On: 02/23/2014 16:58    EKG: Independently reviewed. atrial fibrillation, RVR.  Assessment/Plan Principal Problem:   Syncope Active Problems:   Dyslipidemia   Essential hypertension   PAD (peripheral artery disease)   Atrial fibrillation with RVR   Severe tricuspid regurgitation by prior echocardiogram   Pulmonary hypertension   Syncope and  collapse   CAP (community acquired pneumonia)   1. Syncope  A. fib with RVR  The patient is presenting with complaints of cough, shortness of breath and an syncope event. Most likely this is secondary to arrhythmia. She is found to have A. fib with RVR. She also had tricuspid regurgitation. Cardiology has evaluated the patient and recommend echocardiogram and the further treatment for A. fib. At present she will be admitted in telemetry. Checking magnesium, TSH, troponin and echocardiogram in the morning. Continue Cardizem infusion at present  2. Community-acquired pneumonia. Patient has cough shortness of breath with mild leukocytosis and chest x-ray showing evidence of possible pulmonary infiltrate on the right side suggesting pneumonia. With this the patient will be started on ceftriaxone and azithromycin, follow cultures.  Although with 3 days history of symptoms which initially started with chest pain and at present has dry cough and shortness of breath possibility of other etiology for infiltrate cannot be ruled out. I will check a d-dimer. If positive we will get a VQ scan in the morning.  3. Essential hypertension. Holding hydrochlorothiazide at present.  4. Acute on chronic kidney disease. Etiology currently unclear but with patient having fatigue and poor oral intake and ongoing use of hydrochlorothiazide at present as ability of prerenal etiology is more likely. Continue gentle IV hydration. Recheck BMP in morning.  Advance goals of care discussion: Full code   Consults: Cardiology  DVT Prophylaxis: Therapeutic anticoagulation Nutrition: Cardiac diet  Family Communication: Family was present at bedside, opportunity was given to ask question and all questions were answered satisfactorily at the time of interview. Disposition: Admitted to inpatient in telemetry unit.  Author: Berle Mull, MD [facility] Pager: [phone] 02/23/2014, 10:45 PM    If  7PM-7AM, please contact night-coverage [email or web address] Password [email or web address]

## 2014-02-23 NOTE — Consult Note (Signed)
CONSULTATION NOTE  Reason for Consult: Syncope, a-fib with RVR  Requesting Physician: Dr. Darl Householder  Cardiologist: Dr. Stanford Breed  HPI: This is a 81 y.o. female with a past medical history significant for syncope in the past. She was seen by Dr. Stanford Breed with cardiology in March 2014 when she was admitted. She apparently has a history of syncope that dates back to when she was young and occurred during her menstrual cycles. She reported she felt nauseated and hot prior to the episode. Apparently she was unconscious for 30 seconds and this was in a restaurant. She had about 5 seconds of CPR performed, but it was not clear that she lost a pulse at that time. It was felt that her syncope was autonomically mediated but there was evidence of conduction disease on her EKG and a first-degree AV block with right bundle branch block. An echocardiogram was performed that time which showed an EF of 35-36%, diastolic dysfunction, mild MR, mild left atrial enlargement, moderate to severe right ventricular enlargement, moderate RAE enlargement and severe tricuspid regurgitation, RVSP was 65 mmHg. She was discharged and followed up with Dr. Cristopher Peru. It was felt that her syncope was due to autonomic dysfunction and no evidence for arrhythmia was noted on monitoring. A loop recorder was offered however she declined and wished to wait to see if she has recurrent syncopal event. It was felt that if she had another syncopal event that was unexplained, then she should have an implanted loop recorder. I do not see where any other workup regarding her severe tricuspid regurgitation and pulmonary hypertension was performed.  She now presents with near syncope. She's having some other complaints including cough, fatigue and weakness. EKG on admission shows atrial fibrillation with rapid ventricular response. She had reported some right-sided chest discomfort and chest x-ray reveals upper and right middle lobe consolidation  consistent with a pneumonia. Labs indicate very mildly elevated white blood cell count. Creatinine is elevated 1.47. Head CT some mild atrophy but no acute intracranial process.  PMHx:  Past Medical History  Diagnosis Date  . BREAST BIOPSY, HX OF 06/10/2006  . HIP PAIN, LEFT 01/31/2007  . HYPERLIPIDEMIA 06/10/2006  . HYPERTENSION 06/10/2006  . NEOPLASMS UNSPEC NATURE BONE SOFT TISSUE&SKIN 05/31/2008  . OSTEOPOROSIS 06/10/2006  . Impaired glucose tolerance   . Breast cyst    Past Surgical History  Procedure Laterality Date  . Appendectomy    . Abdominal hysterectomy    . Breast surgery      bx x2  . Cholecystectomy  1980    FAMHx: Family History  Problem Relation Age of Onset  . Heart disease Mother     Pacemaker  . Heart disease Father   . Asthma Father   . Diabetes Father   . Cancer Maternal Aunt     breast  . Cancer Sister   . Heart disease Sister   . Hyperlipidemia Sister   . Hypertension Sister     SOCHx:  reports that she has never smoked. Her smokeless tobacco use includes Snuff. She reports that she does not drink alcohol or use illicit drugs.  ALLERGIES: No Known Allergies  ROS: A comprehensive review of systems was negative except for: Respiratory: positive for cough, pleurisy/chest pain and pneumonia Cardiovascular: positive for irregular heart beat, palpitations and syncope  HOME MEDICATIONS:   Medication List    ASK your doctor about these medications        alendronate 70 MG tablet  Commonly known as:  FOSAMAX  TAKE 1 TABLET BY MOUTH EVERY 7 DAYS WITH A FULL GLASS OF WATER ON AN EMPTY STOMACH     ALPRAZolam 0.5 MG tablet  Commonly known as:  XANAX  TAKE 1 TABLET BY MOUTH TWICE A DAY AS NEEDED     aspirin 325 MG EC tablet  Take 325 mg by mouth every 6 (six) hours as needed for pain.     hydrochlorothiazide 25 MG tablet  Commonly known as:  HYDRODIURIL  Take 1 tablet (25 mg total) by mouth daily.     HYDROcodone-acetaminophen 5-325 MG per  tablet  Commonly known as:  NORCO/VICODIN  Take 1 tablet by mouth every 6 (six) hours as needed for moderate pain.     simvastatin 40 MG tablet  Commonly known as:  ZOCOR  Take 1 tablet (40 mg total) by mouth at bedtime.        HOSPITAL MEDICATIONS: I have reviewed the patient's current medications.  VITALS: Blood pressure 121/80, pulse 84, temperature 99.5 F (37.5 C), temperature source Oral, resp. rate 24, height _0  (1.702 m), weight 175 lb (79.379 kg), SpO2 99 %.  PHYSICAL EXAM: General appearance: alert and no distress Neck: JVD - high V waves cm above sternal notch and no carotid bruit Lungs: diminished breath sounds RML and RUL Heart: irregularly irregular rhythm Abdomen: soft, non-tender; bowel sounds normal; no masses,  no organomegaly Extremities: extremities normal, atraumatic, no cyanosis or edema Pulses: difficult to palpate on feet Skin: pale, hot, dry Neurologic: Grossly normal Psych: Pleasant  LABS: Results for orders placed or performed during the hospital encounter of 02/23/14 (from the past 48 hour(s))  CBC  (at [facility])     Status: Abnormal   Collection Time: 02/23/14  3:19 PM  Result Value Ref Range   WBC 10.7 (H) 4.0 - 10.5 K/uL   RBC 4.03 3.87 - 5.11 MIL/uL   Hemoglobin 12.2 12.0 - 15.0 g/dL   HCT 35.9 (L) 36.0 - 46.0 %   MCV 89.1 78.0 - 100.0 fL   MCH 30.3 26.0 - 34.0 pg   MCHC 34.0 30.0 - 36.0 g/dL   RDW 14.9 11.5 - 15.5 %   Platelets 319 150 - 400 K/uL  Basic metabolic panel  (at [facility])     Status: Abnormal   Collection Time: 02/23/14  3:19 PM  Result Value Ref Range   Sodium 139 135 - 145 mmol/L   Potassium 3.9 3.5 - 5.1 mmol/L   Chloride 102 96 - 112 mmol/L   CO2 27 19 - 32 mmol/L   Glucose, Bld 159 (H) 70 - 99 mg/dL   BUN 38 (H) 6 - 23 mg/dL   Creatinine, Ser 1.47 (H) 0.50 - 1.10 mg/dL   Calcium 8.4 8.4 - 10.5 mg/dL   GFR calc non Af Amer 32 (L) >90 mL/min   GFR calc Af Amer 38 (L) >90 mL/min     Comment: (NOTE) The eGFR has been calculated using the CKD EPI equation. This calculation has not been validated in all clinical situations. eGFR's persistently <90 mL/min signify possible Chronic Kidney Disease.    Anion gap 10 5 - 15  I-Stat CG4 Lactic Acid, ED     Status: None   Collection Time: 02/23/14  8:05 PM  Result Value Ref Range   Lactic Acid, Venous 1.37 0.5 - 2.0 mmol/L    IMAGING: Dg Chest 2 View  02/23/2014   CLINICAL DATA:  Fever and dry cough  for a few days.  EXAM: CHEST  2 VIEW  COMPARISON:  04/02/2012  FINDINGS: There is airspace consolidation in the right upper lobe posteriorly, also in the right middle lobe anteriorly. This likely represents pneumonia given the clinical setting. There are no effusions. There is marked hyperinflation.  IMPRESSION: Patchy consolidation in the right upper lobe and right middle lobe, likely pneumonia in this clinical setting. Recommend follow-up radiographs after treatment to confirm complete clearing.   Electronically Signed   By: Andreas Newport M.D.   On: 02/23/2014 19:09   Ct Head Wo Contrast  02/23/2014   CLINICAL DATA:  Generalized weakness, syncope  EXAM: CT HEAD WITHOUT CONTRAST  TECHNIQUE: Contiguous axial images were obtained from the base of the skull through the vertex without intravenous contrast.  COMPARISON:  04/02/2012  FINDINGS: No skull fracture is noted. Paranasal sinuses and mastoid air cells are unremarkable. No intracranial hemorrhage, mass effect or midline shift. No acute cortical infarction. No mass lesion is noted on this unenhanced scan. The gray and white-matter differentiation is preserved. Mild cerebral atrophy is stable. Atherosclerotic calcifications of carotid siphon are noted.  IMPRESSION: No acute intracranial abnormality.  Mild cerebral atrophy.   Electronically Signed   By: Lahoma Crocker M.D.   On: 02/23/2014 16:58    HOSPITAL DIAGNOSES: Principal Problem:   Syncope Active Problems:   Dyslipidemia    Essential hypertension   PAD (peripheral artery disease)   Atrial fibrillation with RVR   Severe tricuspid regurgitation by prior echocardiogram   Pulmonary hypertension   IMPRESSION: 1. Syncope/Near-syncope 2. New onset a-fib with RVR, CHADSVASC score 5 3. PAD 4. HTN  RECOMMENDATION: 1. New onset A. fib with RVR- agree with Cardizem for short-term rate control. She does have renal insufficiency and therefore IV heparin may be a good initial anticoagulant with using warfarin long-term. This would be more ideal given the fact that she has PAD as well. She will have to have improvement in her pneumonia before her A. fib is better controlled. A. fib could have feasibly been the cause of her syncope however it could also be a result of her pneumonia. She does have significant right heart enlargement which was seen by echo last year including severe TR and moderate right atrial enlargement. Her CHADSVASC score is 5 indicating a significant need for anticoagulation due to high stroke risk. She may need TEE/Cardioversion - possibly Monday if she does not convert, which will give her some time for her pneumonia to improve. 2. Pneumonia- we'll defer to hospital medicine but she will need broad-spectrum antibiotics. 3. CKD 3 - this appears to be chronic and her GFR is close to baseline in December 2015 4. Severe TR/right heart enlargement- this is identified by echo in 2014 but I did not see that she's had any workup or follow-up as to the cause of this. It would be reasonable to repeat an echocardiogram during this admission for further workup. She may need right heart catheterization.  Cardiology will follow along with you thanks for consulting [place].  Time Spent Directly with Patient: 30 minutes  Pixie Casino, MD, [facility] Attending Cardiologist [facility]  HILTY,Kenneth C 02/23/2014, 8:11 PM

## 2014-02-23 NOTE — ED Provider Notes (Signed)
CSN: [id number]     Arrival date & time 02/23/14  1502 History   First MD Initiated Contact with Patient 02/23/14 1757     No chief complaint on file.    (Consider location/radiation/quality/duration/timing/severity/associated sxs/prior Treatment) Patient is a 81 y.o. female presenting with near-syncope. The history is provided by the patient and medical records. No language interpreter was used.  Near Syncope This is a chronic problem. The problem occurs intermittently. The problem has been resolved. Associated symptoms include coughing, fatigue and weakness. Pertinent negatives include no chest pain, chills, fever, nausea or vomiting.    Past Medical History  Diagnosis Date  . BREAST BIOPSY, HX OF 06/10/2006  . HIP PAIN, LEFT 01/31/2007  . HYPERLIPIDEMIA 06/10/2006  . HYPERTENSION 06/10/2006  . NEOPLASMS UNSPEC NATURE BONE SOFT TISSUE&SKIN 05/31/2008  . OSTEOPOROSIS 06/10/2006  . Impaired glucose tolerance   . Breast cyst    Past Surgical History  Procedure Laterality Date  . Appendectomy    . Abdominal hysterectomy    . Breast surgery      bx x2  . Cholecystectomy  1980   Family History  Problem Relation Age of Onset  . Heart disease Mother     Pacemaker  . Heart disease Father   . Asthma Father   . Diabetes Father   . Cancer Maternal Aunt     breast  . Cancer Sister   . Heart disease Sister   . Hyperlipidemia Sister   . Hypertension Sister    History  Substance Use Topics  . Smoking status: Never Smoker   . Smokeless tobacco: Current User    Types: Snuff  . Alcohol Use: No   OB History    No data available     Review of Systems  Constitutional: Positive for fatigue. Negative for fever and chills.  Respiratory: Positive for cough.   Cardiovascular: Positive for near-syncope. Negative for chest pain.  Gastrointestinal: Negative for nausea and vomiting.  Neurological: Positive for weakness.  All other systems reviewed and are negative.     Allergies   Review of patient's allergies indicates no known allergies.  Home Medications   Prior to Admission medications   Medication Sig Start Date End Date Taking? Authorizing Provider  alendronate (FOSAMAX) 70 MG tablet TAKE 1 TABLET BY MOUTH EVERY 7 DAYS WITH A FULL GLASS OF WATER ON AN EMPTY STOMACH 06/28/13   Marletta Lor, MD  ALPRAZolam Duanne Moron) 0.5 MG tablet TAKE 1 TABLET BY MOUTH TWICE A DAY AS NEEDED 01/22/14   Marletta Lor, MD  hydrochlorothiazide (HYDRODIURIL) 25 MG tablet Take 1 tablet (25 mg total) by mouth daily. 06/28/13   Marletta Lor, MD  HYDROcodone-acetaminophen (NORCO/VICODIN) 5-325 MG per tablet Take 1 tablet by mouth every 6 (six) hours as needed for moderate pain. 01/22/14   Marletta Lor, MD  simvastatin (ZOCOR) 40 MG tablet Take 1 tablet (40 mg total) by mouth at bedtime. 06/28/13   Marletta Lor, MD   BP 123/62 mmHg  Pulse 111  Temp(Src) 99.1 F (37.3 C) (Oral)  Resp 22  Ht 5\' 7"  (1.702 m)  Wt 175 lb (79.379 kg)  BMI 27.40 kg/m2  SpO2 96% Physical Exam  Constitutional: She is oriented to person, place, and time. She appears well-developed and well-nourished.  HENT:  Head: Normocephalic and atraumatic.  Eyes: EOM are normal. Pupils are equal, round, and reactive to light.  Neck: Neck supple.  Cardiovascular: Intact distal pulses.   Pulmonary/Chest: No respiratory distress. She  has no rales.  Abdominal: Soft.  Musculoskeletal: She exhibits no edema or tenderness.  kyphosis  Neurological: She is alert and oriented to person, place, and time.  Skin: Skin is warm and dry.  Psychiatric: She has a normal mood and affect.  Nursing note and vitals reviewed.   ED Course  Procedures (including critical care time) Labs Review Labs Reviewed  CBC - Abnormal; Notable for the following:    WBC 10.7 (*)    HCT 35.9 (*)    All other components within normal limits  BASIC METABOLIC PANEL - Abnormal; Notable for the following:    Glucose, Bld  159 (*)    BUN 38 (*)    Creatinine, Ser 1.47 (*)    GFR calc non Af Amer 32 (*)    GFR calc Af Amer 38 (*)    All other components within normal limits  CBG MONITORING, ED    Imaging Review Ct Head Wo Contrast  02/23/2014   CLINICAL DATA:  Generalized weakness, syncope  EXAM: CT HEAD WITHOUT CONTRAST  TECHNIQUE: Contiguous axial images were obtained from the base of the skull through the vertex without intravenous contrast.  COMPARISON:  04/02/2012  FINDINGS: No skull fracture is noted. Paranasal sinuses and mastoid air cells are unremarkable. No intracranial hemorrhage, mass effect or midline shift. No acute cortical infarction. No mass lesion is noted on this unenhanced scan. The gray and white-matter differentiation is preserved. Mild cerebral atrophy is stable. Atherosclerotic calcifications of carotid siphon are noted.  IMPRESSION: No acute intracranial abnormality.  Mild cerebral atrophy.   Electronically Signed   By: Lahoma Crocker M.D.   On: 02/23/2014 16:58     EKG Interpretation None     81 year old female presents for evaluation of syncope.  Patient states today's episode occurred while she was bending over to put on her pants.  Patient reports she has had similar syncopal episodes in the past.  Dry cough x 3 days.  Episode of right sided chest wall pain several days ago that resolved spontaneously. ECG reveals a-fib.   Patient discussed with and seen by Dr. Darl Householder.  Patient with new onset atrial fibrillation with RVR.  Received 10 mg cardizem, no significant change in rate.  Cardizem drip initiated.    Chest xray reveals upper and middle right lobe consolidation.  Treatment started for CAP with rocephin and azithromycin.    This patients CHA2DS2-VASc Score and unadjusted Ischemic Stroke Rate (% per year) is equal to 2.2 % stroke rate/year from a score of 2  Above score calculated as 1 point each if present [CHF, HTN, DM, Vascular=MI/PAD/Aortic Plaque, Age if 65-74, or Female] Above  score calculated as 2 points each if present [Age > 75, or Stroke/TIA/TE]   Consult with cardiology-will see patient and make recommendations for possible anti-coagulation  Admit to medicine. MDM   Final diagnoses:  Cough    Syncope. New onset atrial fib with RVR. CAP.    Norman Herrlich, NP 02/24/14 1194  Wandra Arthurs, MD 02/26/14 [phone]

## 2014-02-23 NOTE — Progress Notes (Signed)
   Subjective:    Patient ID: Tammy [name], female    DOB: 1931-08-01, 81 y.o.   MRN: [id number]  HPI Here with her daughter for 3 days of fever, intermittent chest pains, SOB, and a non-productive cough. She is drinking fluids but has had very little food. Also around noon today she was at home and apparently passed out for a brief time. Her husband found her sitting in a chair unresponsive for a few seconds and then she came to. No slurred speech or other neurologic deficits.    Review of Systems  Constitutional: Positive for fever and fatigue. Negative for chills and diaphoresis.  HENT: Negative.   Respiratory: Positive for cough and shortness of breath. Negative for wheezing.   Cardiovascular: Positive for chest pain. Negative for palpitations and leg swelling.  Gastrointestinal: Negative.   Neurological: Positive for syncope. Negative for dizziness, seizures, facial asymmetry, speech difficulty and headaches.       Objective:   Physical Exam  Constitutional: She is oriented to person, place, and time.  Alert but appears ill, in a wheelchair   Eyes: Conjunctivae and EOM are normal. Pupils are equal, round, and reactive to light.  Neck: No thyromegaly present.  Cardiovascular: Normal rate, normal heart sounds and intact distal pulses.   No murmur heard. Very irregular rhythm   Pulmonary/Chest: Effort normal. No respiratory distress. She has no rales. She exhibits no tenderness.  Soft scattered wheezes   Lymphadenopathy:    She has no cervical adenopathy.  Neurological: She is alert and oriented to person, place, and time. No cranial nerve deficit.          Assessment & Plan:  I am concerned about a possible pneumonia as well as possible atrial fibrillation. She is dehydrated. We will have her daughter take her straight to [facility] ER now to evaluate and treat.

## 2014-02-23 NOTE — Consult Note (Signed)
ANTICOAGULATION CONSULT NOTE - Initial Consult  Pharmacy Consult for Heparin Indication: atrial fibrillation  No Known Allergies  Patient Measurements: Height: 5\' 7"  (170.2 cm) Weight: 175 lb (79.379 kg) IBW/kg (Calculated) : 61.6 Heparin Dosing Weight: ~78kg  Vital Signs: Temp: 97.8 F (36.6 C) (01/29 2120) Temp Source: Oral (01/29 2120) BP: 112/72 mmHg (01/29 2118) Pulse Rate: 97 (01/29 2130)  Labs:  Recent Labs  02/23/14 1519 02/23/14 1942  HGB 12.2  --   HCT 35.9*  --   PLT 319  --   LABPROT  --  15.4*  INR  --  1.21  CREATININE 1.47*  --     Estimated Creatinine Clearance: 33.1 mL/min (by C-G formula based on Cr of 1.47).   Medical History: Past Medical History  Diagnosis Date  . BREAST BIOPSY, HX OF 06/10/2006  . HIP PAIN, LEFT 01/31/2007  . HYPERLIPIDEMIA 06/10/2006  . HYPERTENSION 06/10/2006  . NEOPLASMS UNSPEC NATURE BONE SOFT TISSUE&SKIN 05/31/2008  . OSTEOPOROSIS 06/10/2006  . Impaired glucose tolerance   . Breast cyst     Medications:  No anticoagulants pta  Assessment: 82yof presented to the ED with generalized weakness, dry cough, and syncope x 1. She was found to be in new onset afib RVR (chadsvasc = 5). She will begin IV heparin. Renal insufficiency noted with sCr 1.47 and CrCl 81ml/min.   Goal of Therapy:  Heparin level 0.3-0.7 units/ml Monitor platelets by anticoagulation protocol: Yes   Plan:  1) Heparin bolus 4000 units x 1 2) Heparin drip at 1100 units/hr 3) Check 8 hour heparin level 4) Daily heparin level and CBC  Deboraha Sprang 02/23/2014,9:51 PM

## 2014-02-23 NOTE — ED Notes (Addendum)
Nurse Practitioner Tamala Julian advised to hold Cardizem drip at this time. Cardizem drip not given.

## 2014-02-24 ENCOUNTER — Inpatient Hospital Stay (HOSPITAL_COMMUNITY): Payer: Commercial Managed Care - HMO

## 2014-02-24 LAB — CBC WITH DIFFERENTIAL/PLATELET
Basophils Absolute: 0 10*3/uL (ref 0.0–0.1)
Basophils Relative: 0 % (ref 0–1)
Eosinophils Absolute: 0.1 10*3/uL (ref 0.0–0.7)
Eosinophils Relative: 1 % (ref 0–5)
HCT: 33.9 % — ABNORMAL LOW (ref 36.0–46.0)
Hemoglobin: 11.3 g/dL — ABNORMAL LOW (ref 12.0–15.0)
LYMPHS PCT: 23 % (ref 12–46)
Lymphs Abs: 2.1 10*3/uL (ref 0.7–4.0)
MCH: 30.8 pg (ref 26.0–34.0)
MCHC: 33.3 g/dL (ref 30.0–36.0)
MCV: 92.4 fL (ref 78.0–100.0)
MONOS PCT: 9 % (ref 3–12)
Monocytes Absolute: 0.8 10*3/uL (ref 0.1–1.0)
Neutro Abs: 5.9 10*3/uL (ref 1.7–7.7)
Neutrophils Relative %: 67 % (ref 43–77)
Platelets: 313 10*3/uL (ref 150–400)
RBC: 3.67 MIL/uL — ABNORMAL LOW (ref 3.87–5.11)
RDW: 15 % (ref 11.5–15.5)
WBC: 8.8 10*3/uL (ref 4.0–10.5)

## 2014-02-24 LAB — COMPREHENSIVE METABOLIC PANEL
ALK PHOS: 198 U/L — AB (ref 39–117)
ALT: 151 U/L — ABNORMAL HIGH (ref 0–35)
AST: 130 U/L — AB (ref 0–37)
Albumin: 2.6 g/dL — ABNORMAL LOW (ref 3.5–5.2)
Anion gap: 9 (ref 5–15)
BUN: 34 mg/dL — ABNORMAL HIGH (ref 6–23)
CO2: 24 mmol/L (ref 19–32)
Calcium: 8.1 mg/dL — ABNORMAL LOW (ref 8.4–10.5)
Chloride: 104 mmol/L (ref 96–112)
Creatinine, Ser: 1.26 mg/dL — ABNORMAL HIGH (ref 0.50–1.10)
GFR calc Af Amer: 45 mL/min — ABNORMAL LOW (ref >90)
GFR calc non Af Amer: 39 mL/min — ABNORMAL LOW (ref >90)
Glucose, Bld: 122 mg/dL — ABNORMAL HIGH (ref 70–99)
POTASSIUM: 3.4 mmol/L — AB (ref 3.5–5.1)
Sodium: 137 mmol/L (ref 135–145)
Total Bilirubin: 1.6 mg/dL — ABNORMAL HIGH (ref 0.3–1.2)
Total Protein: 6.2 g/dL (ref 6.0–8.3)

## 2014-02-24 LAB — HEPARIN LEVEL (UNFRACTIONATED)
HEPARIN UNFRACTIONATED: 0.32 [IU]/mL (ref 0.30–0.70)
Heparin Unfractionated: 0.33 IU/mL (ref 0.30–0.70)

## 2014-02-24 LAB — TROPONIN I
TROPONIN I: 0.03 ng/mL (ref <0.031)
Troponin I: 0.03 ng/mL (ref <0.031)

## 2014-02-24 MED ORDER — BENZONATATE 100 MG PO CAPS
200.0000 mg | ORAL_CAPSULE | Freq: Three times a day (TID) | ORAL | Status: DC | PRN
  Administered 2014-02-24: 200 mg via ORAL
  Filled 2014-02-24 (×2): qty 2

## 2014-02-24 MED ORDER — DILTIAZEM HCL 30 MG PO TABS
30.0000 mg | ORAL_TABLET | Freq: Four times a day (QID) | ORAL | Status: DC
  Administered 2014-02-24 – 2014-02-26 (×8): 30 mg via ORAL
  Filled 2014-02-24 (×12): qty 1

## 2014-02-24 MED ORDER — IBUPROFEN 400 MG PO TABS
400.0000 mg | ORAL_TABLET | Freq: Once | ORAL | Status: DC
  Filled 2014-02-24: qty 1

## 2014-02-24 MED ORDER — TECHNETIUM TC 99M DIETHYLENETRIAME-PENTAACETIC ACID: 40.0000 | Freq: Once | INTRAVENOUS | Status: AC | PRN

## 2014-02-24 MED ORDER — ATORVASTATIN CALCIUM 20 MG PO TABS
20.0000 mg | ORAL_TABLET | Freq: Every day | ORAL | Status: DC
  Administered 2014-02-24 – 2014-02-26 (×3): 20 mg via ORAL
  Filled 2014-02-24 (×3): qty 1

## 2014-02-24 MED ORDER — TECHNETIUM TO 99M ALBUMIN AGGREGATED
6.0000 | Freq: Once | INTRAVENOUS | Status: AC | PRN
  Administered 2014-02-24: 6 via INTRAVENOUS

## 2014-02-24 NOTE — Progress Notes (Signed)
[facility]                                                                              Patient Demographics  Tammy [name], is a 81 y.o. female, DOB - 12-16-1931, XAJ:[id number]  Admit date - 02/23/2014   Admitting Physician Berle Mull, MD  Outpatient Primary MD for the patient is [name], MD  LOS - 1   No chief complaint on file.     HPI by Dr. Berle Mull on 02/23/2014 Tammy [name] is a 81 y.o. female with Past medical history of hypertension, dyslipidemia, osteoporosis. The patient presented with complaints of cough, shortness of breath and passing out episode. Patient mentions that on Wednesday when she was sleeping she woke up in the middle of the night with sudden onset of right-sided chest/back pain. She felt the pain was intense and was located in her ribs and was making her hard to breathe. After a while the pain improved but it did not resolve completely and she continued to have the pain throughout the day. Later on she started developing a dry cough without any expectoration. She also had some runny nose. She felt chills but no fever. Her pain resolved later on. On Friday when she was trying to see her doctor for the above mentioned complaint and was trying to wear her clothes, and bend forward, she felt dizzy lightheaded and passed out. She was in the bed and denies any head injury and neck injury. She did not have any chest pain chest heaviness chest tightness. Before this event in late December she had complaints of lower back pain which was treated with steroid injection, later on she had some pain in her hip on the left side which also resolved. She mentions she has been having generalized weakness and fatigue for last 2 weeks and has been not been able to do her regular household chores. She is up and about but has not been able to do her usual activity. She denies any diarrhea, burning urination, nausea, vomiting, focal deficit, changes in her medication. She  has a history of peripheral vascular disease, for which she has seen vascular in the past and recommended conservative management. She has rest pain but since last 2 weeks her main complaint is fatigue and tiredness.  Assessment & Plan   New onset Afib RVR -CHADSVASC 5 -Currently on heparin and cardizem -Cardiology consulted and appreciated -Echocardiogram pending -troponins cycled and negative  -If patient does not convert, may need TEE/cardioversion on Monday -TSH 4.017  Syncope -likely secondary to above -CT head: No acute intracranial abnormality -occurred a few years ago, patient had an event monitor at that which showed no evidence of arrhythmia -patient has a history of TR on prior echocardiogram  Community acquired pneumonia -Continues to cough -CXR: Patchy consolidation right upper and middle lobes -Continue azithromycin and ceftriaxone -Will add on antitussives -Urine strep pneumonia and legionella antigens pending  Acute on chronic kidney disease stage III -baseline Creatinine 1-1.2 -improving  Essential Hypertension -HCTZ held  Elevated DDimer -V/Q scan pending -likely secondary to above -Currently no complaints of SOB  History of PVD -Follows with vascular  Code Status: Full  Family Communication: Husband at bedside  Disposition Plan: Admitted  Time Spent in minutes   30 minutes  Procedures  Echocardiogram  Consults   Cardiology  DVT Prophylaxis  Heparin  Lab Results  Component Value Date   PLT 313 02/24/2014    Medications  Scheduled Meds: . aspirin  81 mg Oral Daily  . azithromycin  500 mg Intravenous Q24H  . cefTRIAXone (ROCEPHIN)  IV  1 g Intravenous Q24H  . simvastatin  40 mg Oral QHS   Continuous Infusions: . sodium chloride 50 mL/hr at 02/23/14 2308  . diltiazem (CARDIZEM) infusion 5 mg/hr (02/23/14 2305)  . heparin 1,100 Units/hr (02/23/14 2259)   PRN Meds:.ALPRAZolam, benzonatate  Antibiotics    Anti-infectives     Start     Dose/Rate Route Frequency Ordered Stop   02/23/14 2200  cefTRIAXone (ROCEPHIN) 1 g in dextrose 5 % 50 mL IVPB     1 g100 mL/hr over 30 Minutes Intravenous Every 24 hours 02/23/14 2149 03/02/14 2159   02/23/14 2200  azithromycin (ZITHROMAX) 500 mg in dextrose 5 % 250 mL IVPB     500 mg250 mL/hr over 60 Minutes Intravenous Every 24 hours 02/23/14 2149 03/02/14 2159   02/23/14 1945  cefTRIAXone (ROCEPHIN) 1 g in dextrose 5 % 50 mL IVPB     1 g100 mL/hr over 30 Minutes Intravenous  Once 02/23/14 1934 02/23/14 2058   02/23/14 1945  azithromycin (ZITHROMAX) 500 mg in dextrose 5 % 250 mL IVPB  Status:  Discontinued     500 mg250 mL/hr over 60 Minutes Intravenous  Once 02/23/14 1934 02/23/14 2149        Subjective:   Arman Filter seen and examined today.  Patient continues to complain of cough.  Denies shortness of breath, chest pain, dizziness, headache, abdominal pain.  Objective:   Filed Vitals:   02/23/14 2120 02/23/14 2130 02/23/14 2203 02/24/14 0440  BP:   116/38 150/62  Pulse:  97 101 90  Temp: 97.8 F (36.6 C)  98.1 F (36.7 C) 97.9 F (36.6 C)  TempSrc: Oral  Oral Oral  Resp:  18 19 18   Height:   5\' 7"  (1.702 m)   Weight:   80 kg (176 lb 5.9 oz) 80.1 kg (176 lb 9.4 oz)  SpO2:  98% 94% 96%    Wt Readings from Last 3 Encounters:  02/24/14 80.1 kg (176 lb 9.4 oz)  01/22/14 81.647 kg (180 lb)  12/26/13 80.287 kg (177 lb)     Intake/Output Summary (Last 24 hours) at 02/24/14 1021 Last data filed at 02/24/14 0100  Gross per 24 hour  Intake      0 ml  Output    350 ml  Net   -350 ml    Exam  General: Well developed, well nourished, NAD, appears stated age  HEENT: NCAT,  mucous membranes moist.   Cardiovascular: S1 S2 auscultated, irregular  Respiratory: diminished breath sounds-right lung fields  Abdomen: Soft, nontender, nondistended, + bowel sounds  Extremities: warm dry without cyanosis clubbing or edema  Neuro: AAOx3, nonfocal  Psych: Normal  affect and demeanor with intact judgement and insight  Data Review   Micro Results No results found for this or any previous visit (from the past 240 hour(s)).  Radiology Reports Dg Chest 2 View  02/23/2014   CLINICAL DATA:  Fever and dry cough for a few days.  EXAM: CHEST  2 VIEW  COMPARISON:  04/02/2012  FINDINGS: There is airspace consolidation in the right  upper lobe posteriorly, also in the right middle lobe anteriorly. This likely represents pneumonia given the clinical setting. There are no effusions. There is marked hyperinflation.  IMPRESSION: Patchy consolidation in the right upper lobe and right middle lobe, likely pneumonia in this clinical setting. Recommend follow-up radiographs after treatment to confirm complete clearing.   Electronically Signed   By: Andreas Newport M.D.   On: 02/23/2014 19:09   Ct Head Wo Contrast  02/23/2014   CLINICAL DATA:  Generalized weakness, syncope  EXAM: CT HEAD WITHOUT CONTRAST  TECHNIQUE: Contiguous axial images were obtained from the base of the skull through the vertex without intravenous contrast.  COMPARISON:  04/02/2012  FINDINGS: No skull fracture is noted. Paranasal sinuses and mastoid air cells are unremarkable. No intracranial hemorrhage, mass effect or midline shift. No acute cortical infarction. No mass lesion is noted on this unenhanced scan. The gray and white-matter differentiation is preserved. Mild cerebral atrophy is stable. Atherosclerotic calcifications of carotid siphon are noted.  IMPRESSION: No acute intracranial abnormality.  Mild cerebral atrophy.   Electronically Signed   By: Lahoma Crocker M.D.   On: 02/23/2014 16:58    CBC  Recent Labs Lab 02/23/14 1519 02/24/14 0400  WBC 10.7* 8.8  HGB 12.2 11.3*  HCT 35.9* 33.9*  PLT 319 313  MCV 89.1 92.4  MCH 30.3 30.8  MCHC 34.0 33.3  RDW 14.9 15.0  LYMPHSABS  --  2.1  MONOABS  --  0.8  EOSABS  --  0.1  BASOSABS  --  0.0    Chemistries   Recent Labs Lab 02/23/14 1519  02/23/14 2223 02/24/14 0400  NA 139  --  137  K 3.9  --  3.4*  CL 102  --  104  CO2 27  --  24  GLUCOSE 159*  --  122*  BUN 38*  --  34*  CREATININE 1.47*  --  1.26*  CALCIUM 8.4  --  8.1*  MG  --  1.6  --   AST  --   --  130*  ALT  --   --  151*  ALKPHOS  --   --  198*  BILITOT  --   --  1.6*   ------------------------------------------------------------------------------------------------------------------ estimated creatinine clearance is 38.8 mL/min (by C-G formula based on Cr of 1.26). ------------------------------------------------------------------------------------------------------------------ No results for input(s): HGBA1C in the last 72 hours. ------------------------------------------------------------------------------------------------------------------ No results for input(s): CHOL, HDL, LDLCALC, TRIG, CHOLHDL, LDLDIRECT in the last 72 hours. ------------------------------------------------------------------------------------------------------------------  Recent Labs  02/23/14 2223  TSH 4.017   ------------------------------------------------------------------------------------------------------------------ No results for input(s): VITAMINB12, FOLATE, FERRITIN, TIBC, IRON, RETICCTPCT in the last 72 hours.  Coagulation profile  Recent Labs Lab 02/23/14 1942  INR 1.21     Recent Labs  02/23/14 2223  DDIMER 2.85*    Cardiac Enzymes  Recent Labs Lab 02/23/14 2223 02/24/14 0410  TROPONINI <0.03 0.03   ------------------------------------------------------------------------------------------------------------------ Invalid input(s): POCBNP    Daziah Hesler D.O. on 02/24/2014 at 10:21 AM  Between 7am to 7pm - Pager - [phone]  After 7pm go to [email or web address] - password [email or web address]  And look for the night coverage person covering for me after hours  [facility] Office  [phone]

## 2014-02-24 NOTE — Consult Note (Signed)
ANTICOAGULATION CONSULT NOTE - Follow-Up Consult  Pharmacy Consult for Heparin Indication: atrial fibrillation  No Known Allergies  Patient Measurements: Height: 5\' 7"  (170.2 cm) Weight: 176 lb 9.4 oz (80.1 kg) IBW/kg (Calculated) : 61.6 Heparin Dosing Weight: ~78kg  Vital Signs: Temp: 97.9 F (36.6 C) (01/30 0440) Temp Source: Oral (01/30 0440) BP: 150/62 mmHg (01/30 0440) Pulse Rate: 90 (01/30 0440)  Labs:  Recent Labs  02/23/14 1519 02/23/14 1942 02/23/14 2223 02/24/14 0400 02/24/14 0410 02/24/14 0750 02/24/14 1104 02/24/14 1526  HGB 12.2  --   --  11.3*  --   --   --   --   HCT 35.9*  --   --  33.9*  --   --   --   --   PLT 319  --   --  313  --   --   --   --   LABPROT  --  15.4*  --   --   --   --   --   --   INR  --  1.21  --   --   --   --   --   --   HEPARINUNFRC  --   --   --   --   --  0.33  --  0.32  CREATININE 1.47*  --   --  1.26*  --   --   --   --   TROPONINI  --   --  <0.03  --  0.03  --  0.03  --     Estimated Creatinine Clearance: 38.8 mL/min (by C-G formula based on Cr of 1.26).   Medical History: Past Medical History  Diagnosis Date  . BREAST BIOPSY, HX OF 06/10/2006  . HIP PAIN, LEFT 01/31/2007  . HYPERLIPIDEMIA 06/10/2006  . HYPERTENSION 06/10/2006  . NEOPLASMS UNSPEC NATURE BONE SOFT TISSUE&SKIN 05/31/2008  . OSTEOPOROSIS 06/10/2006  . Impaired glucose tolerance   . Breast cyst     Medications:  No anticoagulants pta  Assessment: 82yof presented to the ED with generalized weakness, dry cough, and syncope x 1. She was found to be in new onset afib RVR (chadsvasc = 5). She will begin IV heparin. Renal insufficiency noted with sCr 1.47 and CrCl 6ml/min.   Initial heparin level is therapeutic at 0.33 on heparin 1100 units/hr. No bleeding is noted. Hgb 11.3, Plt wnl.  Confirmatory HL remains therapeutic at 0.32 on heparin 1100 units/hr. Not bleeding is noted.  Goal of Therapy:  Heparin level 0.3-0.7 units/ml Monitor platelets by  anticoagulation protocol: Yes   Plan:  - Continue heparin drip at 1100 units/hr - Daily heparin level and CBC - Monitor s/sx of bleeding  Andrey Cota. Diona Foley, PharmD Clinical Pharmacist Pager [phone] 02/24/2014,4:39 PM

## 2014-02-24 NOTE — Consult Note (Signed)
ANTICOAGULATION CONSULT NOTE - Follow-Up Consult  Pharmacy Consult for Heparin Indication: atrial fibrillation  No Known Allergies  Patient Measurements: Height: 5\' 7"  (170.2 cm) Weight: 176 lb 9.4 oz (80.1 kg) IBW/kg (Calculated) : 61.6 Heparin Dosing Weight: ~78kg  Vital Signs: Temp: 97.9 F (36.6 C) (01/30 0440) Temp Source: Oral (01/30 0440) BP: 150/62 mmHg (01/30 0440) Pulse Rate: 90 (01/30 0440)  Labs:  Recent Labs  02/23/14 1519 02/23/14 1942 02/23/14 2223 02/24/14 0400 02/24/14 0410 02/24/14 0750  HGB 12.2  --   --  11.3*  --   --   HCT 35.9*  --   --  33.9*  --   --   PLT 319  --   --  313  --   --   LABPROT  --  15.4*  --   --   --   --   INR  --  1.21  --   --   --   --   HEPARINUNFRC  --   --   --   --   --  0.33  CREATININE 1.47*  --   --  1.26*  --   --   TROPONINI  --   --  <0.03  --  0.03  --     Estimated Creatinine Clearance: 38.8 mL/min (by C-G formula based on Cr of 1.26).   Medical History: Past Medical History  Diagnosis Date  . BREAST BIOPSY, HX OF 06/10/2006  . HIP PAIN, LEFT 01/31/2007  . HYPERLIPIDEMIA 06/10/2006  . HYPERTENSION 06/10/2006  . NEOPLASMS UNSPEC NATURE BONE SOFT TISSUE&SKIN 05/31/2008  . OSTEOPOROSIS 06/10/2006  . Impaired glucose tolerance   . Breast cyst     Medications:  No anticoagulants pta  Assessment: 82yof presented to the ED with generalized weakness, dry cough, and syncope x 1. She was found to be in new onset afib RVR (chadsvasc = 5). She will begin IV heparin. Renal insufficiency noted with sCr 1.47 and CrCl 105ml/min.   Initial heparin level is therapeutic at 0.33 on heparin 1100 units/hr. No bleeding is noted. Hgb 11.3, Plt wnl.  Goal of Therapy:  Heparin level 0.3-0.7 units/ml Monitor platelets by anticoagulation protocol: Yes   Plan:  - Continue heparin drip at 1100 units/hr - Check 8 hour confirmatory heparin level - Daily heparin level and CBC - Monitor s/sx of bleeding  Andrey Cota. Diona Foley,  PharmD Clinical Pharmacist Pager [phone] 02/24/2014,10:19 AM

## 2014-02-24 NOTE — Progress Notes (Signed)
Patient ID: Tammy [name], female   DOB: 03/28/1931, 81 y.o.   MRN: [id number]     Subjective:    No complaints this AM  Objective:   Temp:  [97.8 F (36.6 C)-99.6 F (37.6 C)] 97.9 F (36.6 C) (01/30 0440) Pulse Rate:  [65-111] 90 (01/30 0440) Resp:  [18-24] 18 (01/30 0440) BP: (112-150)/(38-80) 150/62 mmHg (01/30 0440) SpO2:  [90 %-99 %] 96 % (01/30 0440) Weight:  [175 lb (79.379 kg)-176 lb 9.4 oz (80.1 kg)] 176 lb 9.4 oz (80.1 kg) (01/30 0440)    Filed Weights   02/23/14 1515 02/23/14 2203 02/24/14 0440  Weight: 175 lb (79.379 kg) 176 lb 5.9 oz (80 kg) 176 lb 9.4 oz (80.1 kg)    Intake/Output Summary (Last 24 hours) at 02/24/14 1258 Last data filed at 02/24/14 0100  Gross per 24 hour  Intake      0 ml  Output    350 ml  Net   -350 ml    Telemetry: afib rates 80s Exam:  General: NAD   Resp: CTAB  Cardiac: irreg, no m/r/g, no JVD  OE:HOZYYQM soft, NT, ND  MSK: trace bilateral edema  Neuro: no focal deficits   Lab Results:  Basic Metabolic Panel:  Recent Labs Lab 02/23/14 1519 02/23/14 2223 02/24/14 0400  NA 139  --  137  K 3.9  --  3.4*  CL 102  --  104  CO2 27  --  24  GLUCOSE 159*  --  122*  BUN 38*  --  34*  CREATININE 1.47*  --  1.26*  CALCIUM 8.4  --  8.1*  MG  --  1.6  --     Liver Function Tests:  Recent Labs Lab 02/24/14 0400  AST 130*  ALT 151*  ALKPHOS 198*  BILITOT 1.6*  PROT 6.2  ALBUMIN 2.6*    CBC:  Recent Labs Lab 02/23/14 1519 02/24/14 0400  WBC 10.7* 8.8  HGB 12.2 11.3*  HCT 35.9* 33.9*  MCV 89.1 92.4  PLT 319 313    Cardiac Enzymes:  Recent Labs Lab 02/23/14 2223 02/24/14 0410 02/24/14 1104  TROPONINI <0.03 0.03 0.03    BNP: No results for input(s): PROBNP in the last 8760 hours.  Coagulation:  Recent Labs Lab 02/23/14 1942  INR 1.21    ECG:   Medications:   Scheduled Medications: . aspirin  81 mg Oral Daily  . azithromycin  500 mg Intravenous Q24H  . cefTRIAXone (ROCEPHIN)  IV   1 g Intravenous Q24H  . simvastatin  40 mg Oral QHS     Infusions: . sodium chloride 50 mL/hr at 02/23/14 2308  . diltiazem (CARDIZEM) infusion 5 mg/hr (02/23/14 2305)  . heparin 1,100 Units/hr (02/23/14 2259)     PRN Medications:  ALPRAZolam, benzonatate, technetium TC 76M diethylenetriame-pentaacetic acid     Assessment/Plan    1. New onset afib -on dilt gtt for rate control, hep gtt for stroke prevention. Start oral dilt and wean gtt, likely change to NOAC tomorrow.  - VQ scan pending  2. RV dysfunction with TR - noted on prior echo 03/2012, PASP 65 - awaiting repeat echo, f/u VQ scan  3. Pneumonia - per primary team    Carlyle Dolly, M.D.

## 2014-02-25 ENCOUNTER — Inpatient Hospital Stay (HOSPITAL_COMMUNITY): Payer: Commercial Managed Care - HMO

## 2014-02-25 DIAGNOSIS — R791 Abnormal coagulation profile: Secondary | ICD-10-CM

## 2014-02-25 DIAGNOSIS — R059 Cough, unspecified: Secondary | ICD-10-CM | POA: Insufficient documentation

## 2014-02-25 DIAGNOSIS — R05 Cough: Secondary | ICD-10-CM

## 2014-02-25 DIAGNOSIS — R7989 Other specified abnormal findings of blood chemistry: Secondary | ICD-10-CM | POA: Insufficient documentation

## 2014-02-25 LAB — BASIC METABOLIC PANEL
ANION GAP: 11 (ref 5–15)
BUN: 28 mg/dL — AB (ref 6–23)
CO2: 24 mmol/L (ref 19–32)
Calcium: 8 mg/dL — ABNORMAL LOW (ref 8.4–10.5)
Chloride: 102 mmol/L (ref 96–112)
Creatinine, Ser: 1.11 mg/dL — ABNORMAL HIGH (ref 0.50–1.10)
GFR, EST AFRICAN AMERICAN: 53 mL/min — AB (ref >90)
GFR, EST NON AFRICAN AMERICAN: 46 mL/min — AB (ref >90)
GLUCOSE: 160 mg/dL — AB (ref 70–99)
Potassium: 3 mmol/L — ABNORMAL LOW (ref 3.5–5.1)
Sodium: 137 mmol/L (ref 135–145)

## 2014-02-25 LAB — CBC
HEMATOCRIT: 32 % — AB (ref 36.0–46.0)
Hemoglobin: 10.6 g/dL — ABNORMAL LOW (ref 12.0–15.0)
MCH: 29.9 pg (ref 26.0–34.0)
MCHC: 33.1 g/dL (ref 30.0–36.0)
MCV: 90.4 fL (ref 78.0–100.0)
PLATELETS: 330 10*3/uL (ref 150–400)
RBC: 3.54 MIL/uL — ABNORMAL LOW (ref 3.87–5.11)
RDW: 14.8 % (ref 11.5–15.5)
WBC: 8.9 10*3/uL (ref 4.0–10.5)

## 2014-02-25 LAB — HEPARIN LEVEL (UNFRACTIONATED): HEPARIN UNFRACTIONATED: 0.22 [IU]/mL — AB (ref 0.30–0.70)

## 2014-02-25 MED ORDER — POTASSIUM CHLORIDE CRYS ER 20 MEQ PO TBCR
40.0000 meq | EXTENDED_RELEASE_TABLET | Freq: Once | ORAL | Status: AC
  Administered 2014-02-25: 40 meq via ORAL

## 2014-02-25 MED ORDER — IOHEXOL 350 MG/ML SOLN
100.0000 mL | Freq: Once | INTRAVENOUS | Status: AC | PRN
  Administered 2014-02-25: 100 mL via INTRAVENOUS

## 2014-02-25 MED ORDER — APIXABAN 5 MG PO TABS
5.0000 mg | ORAL_TABLET | Freq: Two times a day (BID) | ORAL | Status: DC
  Administered 2014-02-25 (×2): 5 mg via ORAL
  Filled 2014-02-25 (×4): qty 1

## 2014-02-25 NOTE — Progress Notes (Signed)
ANTICOAGULATION CONSULT NOTE - Follow Up Consult  Pharmacy Consult for Heparin >> Apixaban Indication: atrial fibrillation  No Known Allergies  Patient Measurements: Height: 5\' 7"  (170.2 cm) Weight: 158 lb 1.1 oz (71.7 kg) IBW/kg (Calculated) : 61.6  Vital Signs: Temp: 100 F (37.8 C) (01/31 0415) Temp Source: Oral (01/31 0415) BP: 131/53 mmHg (01/31 0415) Pulse Rate: 94 (01/31 0415)  Labs:  Recent Labs  02/23/14 1519 02/23/14 1942 02/23/14 2223 02/24/14 0400 02/24/14 0410 02/24/14 0750 02/24/14 1104 02/24/14 1526 02/25/14 0325 02/25/14 0801  HGB 12.2  --   --  11.3*  --   --   --   --   --  10.6*  HCT 35.9*  --   --  33.9*  --   --   --   --   --  32.0*  PLT 319  --   --  313  --   --   --   --   --  330  LABPROT  --  15.4*  --   --   --   --   --   --   --   --   INR  --  1.21  --   --   --   --   --   --   --   --   HEPARINUNFRC  --   --   --   --   --  0.33  --  0.32 0.22*  --   CREATININE 1.47*  --   --  1.26*  --   --   --   --   --  1.11*  TROPONINI  --   --  <0.03  --  0.03  --  0.03  --   --   --     Estimated Creatinine Clearance: 39.3 mL/min (by C-G formula based on Cr of 1.11).   Medications:  Scheduled:  . apixaban  5 mg Oral BID  . aspirin  81 mg Oral Daily  . atorvastatin  20 mg Oral q1800  . azithromycin  500 mg Intravenous Q24H  . cefTRIAXone (ROCEPHIN)  IV  1 g Intravenous Q24H  . diltiazem  30 mg Oral 4 times per day  . ibuprofen  400 mg Oral Once   Infusions:  . sodium chloride 50 mL/hr at 02/23/14 2308    Assessment: 81 yo F admitted 1/29 and found to have new onset afib.  Pt was started on a heparin infusion with plans to transition to apixaban today.  Age = 81y, Wt >60kg, SCr <1.5 therefore will begin 5mg  BID dosing.  Goal of Therapy:  Therapeutic anticoagulation Monitor platelets by anticoagulation protocol: Yes   Plan:  D/C heparin and associated labs (done) Apixaban 5mg  PO BID - first dose this AM.  Horton Chin,  Pharm.D., BCPS Clinical Pharmacist Pager [phone] 02/25/2014 9:46 AM

## 2014-02-25 NOTE — Progress Notes (Signed)
Physical Therapy Evaluation Patient Details Name: Tammy [name] MRN: [id number] DOB: Jun 18, 1931 Today's Date: 02/25/2014   History of Present Illness  Patient is an 81 yo female admitted 02/23/14 with syncopal episode, Afib with RVR, and CAP.  PMH:  HTN, HLD, osteoporosis, back pain, PVD  Clinical Impression  Patient presents with problems listed below.  Will benefit from acute PT to maximize independence prior to discharge home with husband.  Patient with balance deficit impacting safety with gait.  Recommend patient use her cane for safety, and recommend HHPT for continued therapy at discharge.    Follow Up Recommendations Home health PT;Supervision/Assistance - 24 hour    Equipment Recommendations  None recommended by PT    Recommendations for Other Services       Precautions / Restrictions Precautions Precautions: Fall Precaution Comments: Slightly unsteady - patient reports this is recent baseline Restrictions Weight Bearing Restrictions: No      Mobility  Bed Mobility Overal bed mobility: Modified Independent                Transfers Overall transfer level: Needs assistance Equipment used: None Transfers: Sit to/from Stand Sit to Stand: Supervision         General transfer comment: Supervision for safety/balance.  Ambulation/Gait Ambulation/Gait assistance: Min guard Ambulation Distance (Feet): 120 Feet Assistive device: None Gait Pattern/deviations: Step-through pattern;Decreased stride length;Shuffle;Staggering left;Staggering right Gait velocity: Decreased Gait velocity interpretation: Below normal speed for age/gender General Gait Details: Patient with staggering gait - to both sides.  Able to self-correct most of the time.  Required assist x2 during gait to regain balance.    Stairs            Wheelchair Mobility    Modified Rankin (Stroke Patients Only)       Balance Overall balance assessment: Needs assistance                           High level balance activites: Turns;Sudden stops;Head turns High Level Balance Comments: Loss of balance with any high level balance activity.  Has to stop walking to talk with PT. (unable to walk and talk at same time.             Pertinent Vitals/Pain Pain Assessment: No/denies pain    Home Living Family/patient expects to be discharged to:: Private residence Living Arrangements: Spouse/significant other Available Help at Discharge: Family;Available 24 hours/day Type of Home: House Home Access: Stairs to enter Entrance Stairs-Rails: Right Entrance Stairs-Number of Steps: 3 Home Layout: One level Home Equipment: Cane - single point;Crutches      Prior Function Level of Independence: Independent         Comments: Reports she "furniture walks" recently     Hand Dominance        Extremity/Trunk Assessment   Upper Extremity Assessment: Generalized weakness           Lower Extremity Assessment: Generalized weakness         Communication   Communication: No difficulties  Cognition Arousal/Alertness: Awake/alert Behavior During Therapy: WFL for tasks assessed/performed Overall Cognitive Status: Within Functional Limits for tasks assessed                      General Comments      Exercises        Assessment/Plan    PT Assessment Patient needs continued PT services  PT Diagnosis Difficulty walking;Abnormality of gait;Generalized weakness   PT Problem  List Decreased strength;Decreased activity tolerance;Decreased balance;Decreased mobility;Decreased knowledge of use of DME;Decreased knowledge of precautions;Cardiopulmonary status limiting activity  PT Treatment Interventions DME instruction;Gait training;Functional mobility training;Therapeutic activities;Balance training;Patient/family education   PT Goals (Current goals can be found in the Care Plan section) Acute Rehab PT Goals Patient Stated Goal: To go home tomorrow PT Goal  Formulation: With patient Time For Goal Achievement: 03/04/14 Potential to Achieve Goals: Good    Frequency Min 3X/week   Barriers to discharge        Co-evaluation               End of Session Equipment Utilized During Treatment: Gait belt Activity Tolerance: Patient limited by fatigue Patient left: in bed;with call bell/phone within reach;with bed alarm set Nurse Communication: Mobility status         Time: 1847-1911 PT Time Calculation (min) (ACUTE ONLY): 24 min   Charges:   PT Evaluation $Initial PT Evaluation Tier I: 1 Procedure PT Treatments $Gait Training: 8-22 mins   PT G CodesDespina Pole 03/16/14, 8:07 PM [name]. Sanjuana Kava, [place] Pager [phone]

## 2014-02-25 NOTE — Progress Notes (Signed)
ANTICOAGULATION CONSULT NOTE - Follow Up Consult  Pharmacy Consult for heparin Indication: atrial fibrillation  Labs:  Recent Labs  02/23/14 1519 02/23/14 1942 02/23/14 2223 02/24/14 0400 02/24/14 0410 02/24/14 0750 02/24/14 1104 02/24/14 1526 02/25/14 0325  HGB 12.2  --   --  11.3*  --   --   --   --   --   HCT 35.9*  --   --  33.9*  --   --   --   --   --   PLT 319  --   --  313  --   --   --   --   --   LABPROT  --  15.4*  --   --   --   --   --   --   --   INR  --  1.21  --   --   --   --   --   --   --   HEPARINUNFRC  --   --   --   --   --  0.33  --  0.32 0.22*  CREATININE 1.47*  --   --  1.26*  --   --   --   --   --   TROPONINI  --   --  <0.03  --  0.03  --  0.03  --   --      Assessment: 81yo female now subtherapeutic on heparin after two levels at low end of goal.  Goal of Therapy:  Heparin level 0.3-0.7 units/ml   Plan:  Will increase heparin gtt by 2 units/kg/hr to 1250 units/hr and check level in [address], PharmD, BCPS  02/25/2014,4:18 AM

## 2014-02-25 NOTE — Progress Notes (Signed)
Patient ID: BRIGIDA SCOTTI, female   DOB: 12/18/1931, 81 y.o.   MRN: [id number]    Primary cardiologist:  Subjective:    No complaints.   Objective:   Temp:  [100 F (37.8 C)-102.5 F (39.2 C)] 100 F (37.8 C) (01/31 0415) Pulse Rate:  [92-104] 94 (01/31 0415) Resp:  [17-18] 18 (01/31 0415) BP: (126-131)/(53-67) 131/53 mmHg (01/31 0415) SpO2:  [95 %-96 %] 95 % (01/31 0415) Weight:  [158 lb 1.1 oz (71.7 kg)] 158 lb 1.1 oz (71.7 kg) (01/31 0415) Last BM Date: 02/23/14  Filed Weights   02/23/14 2203 02/24/14 0440 02/25/14 0415  Weight: 176 lb 5.9 oz (80 kg) 176 lb 9.4 oz (80.1 kg) 158 lb 1.1 oz (71.7 kg)    Intake/Output Summary (Last 24 hours) at 02/25/14 0823 Last data filed at 02/25/14 0416  Gross per 24 hour  Intake    480 ml  Output    300 ml  Net    180 ml    Telemetry: afib rates 60-90s  Exam:  General: NAD  Resp: CTAB  Cardiac: irreg, no m/r/g, no JVD  IE:PPIRJJO soft, NT, ND  MSK:no LE edema  Neuro: no focal deficits   Lab Results:  Basic Metabolic Panel:  Recent Labs Lab 02/23/14 1519 02/23/14 2223 02/24/14 0400  NA 139  --  137  K 3.9  --  3.4*  CL 102  --  104  CO2 27  --  24  GLUCOSE 159*  --  122*  BUN 38*  --  34*  CREATININE 1.47*  --  1.26*  CALCIUM 8.4  --  8.1*  MG  --  1.6  --     Liver Function Tests:  Recent Labs Lab 02/24/14 0400  AST 130*  ALT 151*  ALKPHOS 198*  BILITOT 1.6*  PROT 6.2  ALBUMIN 2.6*    CBC:  Recent Labs Lab 02/23/14 1519 02/24/14 0400  WBC 10.7* 8.8  HGB 12.2 11.3*  HCT 35.9* 33.9*  MCV 89.1 92.4  PLT 319 313    Cardiac Enzymes:  Recent Labs Lab 02/23/14 2223 02/24/14 0410 02/24/14 1104  TROPONINI <0.03 0.03 0.03    BNP: No results for input(s): PROBNP in the last 8760 hours.  Coagulation:  Recent Labs Lab 02/23/14 1942  INR 1.21    ECG:   Medications:   Scheduled Medications: . aspirin  81 mg Oral Daily  . atorvastatin  20 mg Oral q1800  . azithromycin  500 mg  Intravenous Q24H  . cefTRIAXone (ROCEPHIN)  IV  1 g Intravenous Q24H  . diltiazem  30 mg Oral 4 times per day  . ibuprofen  400 mg Oral Once     Infusions: . sodium chloride 50 mL/hr at 02/23/14 2308  . diltiazem (CARDIZEM) infusion 5 mg/hr (02/23/14 2305)  . heparin 1,250 Units/hr (02/25/14 0423)     PRN Medications:  ALPRAZolam, benzonatate   Echo   Assessment/Plan    1. New onset afib - admitted with syncope, found to be in afib with RVR, new diagnosis.  -on dilt gtt for rate control, hep gtt for stroke prevention. Started oral dilt yesterday, drip weaned. Currently on dilt 30 mg q 6 hrs.  - stop hep gtt, eliquis 5mg  bid.     2. RV dysfunction with TR - noted on prior echo 03/2012, PASP 65. Awaiting repeat echo.  - may warrant further workup for pulm HTN as outpatient  3. Pneumonia - per primary team  4. Elevated  D-dimer - intermediate risk VQ scan. Now that Cr improved consider CTA.  5. Hypokalemia - per primary team, Keep K at 4 and Mg at 2 in setting of cardiac arrhythmia   Carlyle Dolly, M.D.

## 2014-02-25 NOTE — Progress Notes (Signed)
[facility]                                                                              Patient Demographics  Tammy [name], is a 81 y.o. female, DOB - [birth year], IEP:[id number]  Admit date - 02/23/2014   Admitting Physician Berle Mull, MD  Outpatient Primary MD for the patient is [name], MD  LOS - 2   No chief complaint on file.     HPI by Dr. Berle Mull on 02/23/2014 Tammy [name] is a 81 y.o. female with Past medical history of hypertension, dyslipidemia, osteoporosis. The patient presented with complaints of cough, shortness of breath and passing out episode. Patient mentions that on Wednesday when she was sleeping she woke up in the middle of the night with sudden onset of right-sided chest/back pain. She felt the pain was intense and was located in her ribs and was making her hard to breathe. After a while the pain improved but it did not resolve completely and she continued to have the pain throughout the day. Later on she started developing a dry cough without any expectoration. She also had some runny nose. She felt chills but no fever. Her pain resolved later on. On Friday when she was trying to see her doctor for the above mentioned complaint and was trying to wear her clothes, and bend forward, she felt dizzy lightheaded and passed out. She was in the bed and denies any head injury and neck injury. She did not have any chest pain chest heaviness chest tightness. Before this event in late December she had complaints of lower back pain which was treated with steroid injection, later on she had some pain in her hip on the left side which also resolved. She mentions she has been having generalized weakness and fatigue for last 2 weeks and has been not been able to do her regular household chores. She is up and about but has not been able to do her usual activity. She denies any diarrhea, burning urination, nausea, vomiting, focal deficit, changes in her medication. She  has a history of peripheral vascular disease, for which she has seen vascular in the past and recommended conservative management. She has rest pain but since last 2 weeks her main complaint is fatigue and tiredness.  Assessment & Plan   New onset Afib RVR -CHADSVASC 5 -Initially on heparin and cardizem drips, weaned off -Cardiology consulted and appreciated -Echocardiogram pending -troponins cycled and negative  -If patient does not convert, may need TEE/cardioversion on Monday, per cardiology note on 02/23/2014, however, spoke with Dr. Harl Bowie who feels patient should be medically treated at this time with Diltiazem, and has transitioned patient to Eliquis -TSH 4.017  Syncope -likely secondary to above -CT head: No acute intracranial abnormality -occurred a few years ago, patient had an event monitor at that which showed no evidence of arrhythmia -patient has a history of TR and RV dysfunction on prior echocardiogram- may need workup for pulmonary HTN as an outpatient  Community acquired pneumonia -Continues to cough, but has improved -Patient did develop fever overnight, will continue to monitor -CXR: Patchy consolidation right upper and middle lobes -Continue azithromycin and ceftriaxone,  antitussives -Urine strep pneumonia and legionella antigens pending  Acute on chronic kidney disease stage III -baseline Creatinine 1-1.2 -improving  Essential Hypertension -HCTZ held  Elevated DDimer -V/Q scan shows intermediate risk -likely secondary to above -Currently no complaints of SOB -Will obtain CT Chest (not done previously as patient had AKI)  History of PVD -Follows with vascular  Deconditioning -Likely secondary to above disease states -Will consult PT  Code Status: Full  Family Communication: Husband and daughter at bedside  Disposition Plan: Admitted  Time Spent in minutes   30 minutes  Procedures  Echocardiogram  Consults   Cardiology  DVT Prophylaxis   Heparin --> Eliquis  Lab Results  Component Value Date   PLT 330 02/25/2014    Medications  Scheduled Meds: . apixaban  5 mg Oral BID  . aspirin  81 mg Oral Daily  . atorvastatin  20 mg Oral q1800  . azithromycin  500 mg Intravenous Q24H  . cefTRIAXone (ROCEPHIN)  IV  1 g Intravenous Q24H  . diltiazem  30 mg Oral 4 times per day  . ibuprofen  400 mg Oral Once   Continuous Infusions: . sodium chloride 50 mL/hr at 02/23/14 2308   PRN Meds:.ALPRAZolam, benzonatate  Antibiotics    Anti-infectives    Start     Dose/Rate Route Frequency Ordered Stop   02/23/14 2200  cefTRIAXone (ROCEPHIN) 1 g in dextrose 5 % 50 mL IVPB     1 g100 mL/hr over 30 Minutes Intravenous Every 24 hours 02/23/14 2149 03/02/14 2159   02/23/14 2200  azithromycin (ZITHROMAX) 500 mg in dextrose 5 % 250 mL IVPB     500 mg250 mL/hr over 60 Minutes Intravenous Every 24 hours 02/23/14 2149 03/02/14 2159   02/23/14 1945  cefTRIAXone (ROCEPHIN) 1 g in dextrose 5 % 50 mL IVPB     1 g100 mL/hr over 30 Minutes Intravenous  Once 02/23/14 1934 02/23/14 2058   02/23/14 1945  azithromycin (ZITHROMAX) 500 mg in dextrose 5 % 250 mL IVPB  Status:  Discontinued     500 mg250 mL/hr over 60 Minutes Intravenous  Once 02/23/14 1934 02/23/14 2149        Subjective:   Tammy [name] seen and examined today.  Patient continues to complain of cough but feels it has improved.  She does complain of weakness.  Denies shortness of breath, chest pain, dizziness, headache, abdominal pain.  Objective:   Filed Vitals:   02/24/14 0440 02/24/14 1735 02/24/14 2026 02/25/14 0415  BP: 150/62 126/67 130/54 131/53  Pulse: 90 92 104 94  Temp: 97.9 F (36.6 C) 100.2 F (37.9 C) 102.5 F (39.2 C) 100 F (37.8 C)  TempSrc: Oral Oral Oral Oral  Resp: 18 17 18 18   Height:      Weight: 80.1 kg (176 lb 9.4 oz)   71.7 kg (158 lb 1.1 oz)  SpO2: 96% 95% 96% 95%    Wt Readings from Last 3 Encounters:  02/25/14 71.7 kg (158 lb 1.1 oz)  01/22/14  81.647 kg (180 lb)  12/26/13 80.287 kg (177 lb)     Intake/Output Summary (Last 24 hours) at 02/25/14 1053 Last data filed at 02/25/14 0416  Gross per 24 hour  Intake    360 ml  Output    300 ml  Net     60 ml    Exam  General: Well developed, well nourished, NAD, appears stated age  HEENT: NCAT,  mucous membranes moist.   Cardiovascular: S1 S2 auscultated,  irregular  Respiratory: Clear to auscultation bilaterally  Abdomen: Soft, nontender, nondistended, + bowel sounds  Extremities: warm dry without cyanosis clubbing or edema  Neuro: AAOx3, nonfocal   Psych: Normal affect and demeanor with intact judgement and insight  Data Review   Micro Results No results found for this or any previous visit (from the past 240 hour(s)).  Radiology Reports Dg Chest 2 View  02/23/2014   CLINICAL DATA:  Fever and dry cough for a few days.  EXAM: CHEST  2 VIEW  COMPARISON:  04/02/2012  FINDINGS: There is airspace consolidation in the right upper lobe posteriorly, also in the right middle lobe anteriorly. This likely represents pneumonia given the clinical setting. There are no effusions. There is marked hyperinflation.  IMPRESSION: Patchy consolidation in the right upper lobe and right middle lobe, likely pneumonia in this clinical setting. Recommend follow-up radiographs after treatment to confirm complete clearing.   Electronically Signed   By: Andreas Newport M.D.   On: 02/23/2014 19:09   Ct Head Wo Contrast  02/23/2014   CLINICAL DATA:  Generalized weakness, syncope  EXAM: CT HEAD WITHOUT CONTRAST  TECHNIQUE: Contiguous axial images were obtained from the base of the skull through the vertex without intravenous contrast.  COMPARISON:  04/02/2012  FINDINGS: No skull fracture is noted. Paranasal sinuses and mastoid air cells are unremarkable. No intracranial hemorrhage, mass effect or midline shift. No acute cortical infarction. No mass lesion is noted on this unenhanced scan. The gray and  white-matter differentiation is preserved. Mild cerebral atrophy is stable. Atherosclerotic calcifications of carotid siphon are noted.  IMPRESSION: No acute intracranial abnormality.  Mild cerebral atrophy.   Electronically Signed   By: Lahoma Crocker M.D.   On: 02/23/2014 16:58   Nm Pulmonary Perf And Vent  02/24/2014   CLINICAL DATA:  Positive D-Dimer PT Hx of Breast CA, HLD, HTN, Pneumonia, Osteoporosis. Creatinine 1.26, No documented allergies.  EXAM: NUCLEAR MEDICINE VENTILATION - PERFUSION LUNG SCAN  TECHNIQUE: Ventilation images were obtained in multiple projections using inhaled aerosol technetium 99 M DTPA. Perfusion images were obtained in multiple projections after intravenous injection of Tc-44m MAA.  RADIOPHARMACEUTICALS:  40.0 mCi Tc-43m DTPA aerosol and 6.0 mCi Tc-77m MAA  COMPARISON:  Radiographs from previous day, demonstrating right middle and upper lobe patchy consolidation.  FINDINGS: Ventilation: Heterogeneous distribution, bases more than apices, without discrete ventilation defect.  Perfusion: There are subsegmental defects in the anterior and lateral basal segments of the right lower lobe, more conspicuous than seen on ventilation scintigraphy. These do not correlate with the opacities if seen on recent radiography.  IMPRESSION: 1. Intermediate likelihood ratio for pulmonary embolism. CTA chest with contrast may be useful for further elucidation of these findings.   Electronically Signed   By: Arne Cleveland M.D.   On: 02/24/2014 13:33    CBC  Recent Labs Lab 02/23/14 1519 02/24/14 0400 02/25/14 0801  WBC 10.7* 8.8 8.9  HGB 12.2 11.3* 10.6*  HCT 35.9* 33.9* 32.0*  PLT 319 313 330  MCV 89.1 92.4 90.4  MCH 30.3 30.8 29.9  MCHC 34.0 33.3 33.1  RDW 14.9 15.0 14.8  LYMPHSABS  --  2.1  --   MONOABS  --  0.8  --   EOSABS  --  0.1  --   BASOSABS  --  0.0  --     Chemistries   Recent Labs Lab 02/23/14 1519 02/23/14 2223 02/24/14 0400 02/25/14 0801  NA 139  --  137 137  K 3.9  --  3.4* 3.0*  CL 102  --  104 102  CO2 27  --  24 24  GLUCOSE 159*  --  122* 160*  BUN 38*  --  34* 28*  CREATININE 1.47*  --  1.26* 1.11*  CALCIUM 8.4  --  8.1* 8.0*  MG  --  1.6  --   --   AST  --   --  130*  --   ALT  --   --  151*  --   ALKPHOS  --   --  198*  --   BILITOT  --   --  1.6*  --    ------------------------------------------------------------------------------------------------------------------ estimated creatinine clearance is 39.3 mL/min (by C-G formula based on Cr of 1.11). ------------------------------------------------------------------------------------------------------------------ No results for input(s): HGBA1C in the last 72 hours. ------------------------------------------------------------------------------------------------------------------ No results for input(s): CHOL, HDL, LDLCALC, TRIG, CHOLHDL, LDLDIRECT in the last 72 hours. ------------------------------------------------------------------------------------------------------------------  Recent Labs  02/23/14 2223  TSH 4.017   ------------------------------------------------------------------------------------------------------------------ No results for input(s): VITAMINB12, FOLATE, FERRITIN, TIBC, IRON, RETICCTPCT in the last 72 hours.  Coagulation profile  Recent Labs Lab 02/23/14 1942  INR 1.21     Recent Labs  02/23/14 2223  DDIMER 2.85*    Cardiac Enzymes  Recent Labs Lab 02/23/14 2223 02/24/14 0410 02/24/14 1104  TROPONINI <0.03 0.03 0.03   ------------------------------------------------------------------------------------------------------------------ Invalid input(s): POCBNP    Lavontay Kirk D.O. on 02/25/2014 at 10:53 AM  Between 7am to 7pm - Pager - [phone]  After 7pm go to [email or web address] - password [email or web address]  And look for the night coverage person covering for me after hours  [facility] Office  [phone]

## 2014-02-26 DIAGNOSIS — I471 Supraventricular tachycardia: Secondary | ICD-10-CM

## 2014-02-26 DIAGNOSIS — R55 Syncope and collapse: Secondary | ICD-10-CM

## 2014-02-26 DIAGNOSIS — I2699 Other pulmonary embolism without acute cor pulmonale: Secondary | ICD-10-CM

## 2014-02-26 LAB — CBC
HEMATOCRIT: 31.7 % — AB (ref 36.0–46.0)
Hemoglobin: 10.4 g/dL — ABNORMAL LOW (ref 12.0–15.0)
MCH: 29.6 pg (ref 26.0–34.0)
MCHC: 32.8 g/dL (ref 30.0–36.0)
MCV: 90.3 fL (ref 78.0–100.0)
PLATELETS: 355 10*3/uL (ref 150–400)
RBC: 3.51 MIL/uL — AB (ref 3.87–5.11)
RDW: 15 % (ref 11.5–15.5)
WBC: 8.3 10*3/uL (ref 4.0–10.5)

## 2014-02-26 LAB — BASIC METABOLIC PANEL
Anion gap: 9 (ref 5–15)
BUN: 21 mg/dL (ref 6–23)
CHLORIDE: 106 mmol/L (ref 96–112)
CO2: 25 mmol/L (ref 19–32)
Calcium: 8.5 mg/dL (ref 8.4–10.5)
Creatinine, Ser: 1.11 mg/dL — ABNORMAL HIGH (ref 0.50–1.10)
GFR calc Af Amer: 53 mL/min — ABNORMAL LOW (ref >90)
GFR calc non Af Amer: 46 mL/min — ABNORMAL LOW (ref >90)
Glucose, Bld: 128 mg/dL — ABNORMAL HIGH (ref 70–99)
Potassium: 4.3 mmol/L (ref 3.5–5.1)
SODIUM: 140 mmol/L (ref 135–145)

## 2014-02-26 MED ORDER — BENZONATATE 200 MG PO CAPS: 200.0000 mg | ORAL_CAPSULE | Freq: Three times a day (TID) | ORAL | Status: DC | PRN

## 2014-02-26 MED ORDER — APIXABAN 5 MG PO TABS: 10.0000 mg | ORAL_TABLET | Freq: Two times a day (BID) | ORAL | Status: DC

## 2014-02-26 MED ORDER — APIXABAN 5 MG PO TABS
10.0000 mg | ORAL_TABLET | Freq: Two times a day (BID) | ORAL | Status: DC
  Administered 2014-02-26: 10 mg via ORAL
  Filled 2014-02-26 (×2): qty 2

## 2014-02-26 MED ORDER — ASPIRIN 81 MG PO TBEC: 81.0000 mg | DELAYED_RELEASE_TABLET | Freq: Every day | ORAL | Status: DC

## 2014-02-26 MED ORDER — APIXABAN 5 MG PO TABS: 5.0000 mg | ORAL_TABLET | Freq: Two times a day (BID) | ORAL | Status: DC

## 2014-02-26 MED ORDER — ATORVASTATIN CALCIUM 20 MG PO TABS: 20.0000 mg | ORAL_TABLET | Freq: Every day | ORAL | Status: DC

## 2014-02-26 MED ORDER — DILTIAZEM HCL ER COATED BEADS 120 MG PO CP24: 120.0000 mg | ORAL_CAPSULE | Freq: Every day | ORAL | Status: DC

## 2014-02-26 MED ORDER — DILTIAZEM HCL ER COATED BEADS 120 MG PO CP24
120.0000 mg | ORAL_CAPSULE | Freq: Every day | ORAL | Status: DC
  Administered 2014-02-26: 120 mg via ORAL
  Filled 2014-02-26 (×2): qty 1

## 2014-02-26 MED ORDER — LEVOFLOXACIN 750 MG PO TABS: 750.0000 mg | ORAL_TABLET | Freq: Every day | ORAL | Status: DC

## 2014-02-26 NOTE — Discharge Summary (Addendum)
Physician Discharge Summary  [name]:[id number] DOB: 1931-07-23 DOA: 02/23/2014  PCP: [name], MD  Admit date: 02/23/2014 Discharge date: 02/26/2014  Time spent: 45 minutes  Recommendations for Outpatient Follow-up:  Patient will be discharged. She will need to continue her medications as prescribed.  She will need to follow up with her primary care physician within one week of discharge.  Patient will need to followup with cardiology.  Patient to continue a heart healthy diet.  Continue activity as tolerated. Follow a heart healthy diet.  Discharge Diagnoses:  Principal Problem:   Syncope Active Problems:   Dyslipidemia   Essential hypertension   PAD (peripheral artery disease)   Severe tricuspid regurgitation by prior echocardiogram   Pulmonary hypertension   Syncope and collapse   CAP (community acquired pneumonia)   Cough   Positive D dimer   Discharge Condition: Stable  Diet recommendation: Heart healthy  Filed Weights   02/24/14 0440 02/25/14 0415 02/26/14 0400  Weight: 80.1 kg (176 lb 9.4 oz) 71.7 kg (158 lb 1.1 oz) 70.5 kg (155 lb 6.8 oz)    History of present illness:  by Dr. Berle Mull on 02/23/2014 Tammy [name] is a 81 y.o. female with Past medical history of hypertension, dyslipidemia, osteoporosis. The patient presented with complaints of cough, shortness of breath and passing out episode. Patient mentions that on Wednesday when she was sleeping she woke up in the middle of the night with sudden onset of right-sided chest/back pain. She felt the pain was intense and was located in her ribs and was making her hard to breathe. After a while the pain improved but it did not resolve completely and she continued to have the pain throughout the day. Later on she started developing a dry cough without any expectoration. She also had some runny nose. She felt chills but no fever. Her pain resolved later on. On Friday when she was trying to see her  doctor for the above mentioned complaint and was trying to wear her clothes, and bend forward, she felt dizzy lightheaded and passed out. She was in the bed and denies any head injury and neck injury. She did not have any chest pain chest heaviness chest tightness. Before this event in late December she had complaints of lower back pain which was treated with steroid injection, later on she had some pain in her hip on the left side which also resolved. She mentions she has been having generalized weakness and fatigue for last 2 weeks and has been not been able to do her regular household chores. She is up and about but has not been able to do her usual activity. She denies any diarrhea, burning urination, nausea, vomiting, focal deficit, changes in her medication. She has a history of peripheral vascular disease, for which she has seen vascular in the past and recommended conservative management. She has rest pain but since last 2 weeks her main complaint is fatigue and tiredness.  Hospital Course:  MAT/Wandering atrial pacemaker/Atrial tachycardia vs Afib -CHADSVASC 5 -Initially on heparin and cardizem drips, weaned off -Cardiology consulted and appreciated -Echocardiogram EF 46-27%, grade 1 diastolic dysfunction -troponins cycled and negative  -If patient does not convert, may need TEE/cardioversion on Monday, per cardiology note on 02/23/2014, however, spoke with Dr. Harl Bowie who feels patient should be medically treated at this time with Diltiazem, and has transitioned patient to Eliquis -TSH 4.017  Elevated DDimer/Acute pulmonary embolus -V/Q scan shows intermediate risk -likely secondary to above -Currently  no complaints of SOB -CTA chest: Positive for pulmonary embolus seen in the distal right main pulmonary artery and descending right intralobar artery -Continue Eliquis -Lower extremity doppler: Left DVT- femoral, popliteal, posterior tibal veins.  No RLE DVT.  Syncope -likely secondary to  above -CT head: No acute intracranial abnormality -occurred a few years ago, patient had an event monitor at that which showed no evidence of arrhythmia -patient has a history of TR and RV dysfunction on prior echocardiogram- may need workup for pulmonary HTN as an outpatient  Community acquired pneumonia -Continues to cough, but has improved -CXR: Patchy consolidation right upper and middle lobes -Was initially placed on azithromycin and ceftriaxone, antitussives -Urine strep pneumonia and legionella antigens pending -Will discharge patient with levaquin  Acute on chronic kidney disease stage III -baseline Creatinine 1-1.2 -improving  Essential Hypertension -HCTZ held  History of PVD -Follows with vascular  Hyperlipidemia -Switched from simvastatin to atorvastatin due to interaction with diltiazem  Deconditioning -Likely secondary to above disease states -PT recommended home health  Procedures: Echocardiogram  Consultations: Caridology  Discharge Exam: Filed Vitals:   02/26/14 0400  BP: 118/55  Pulse: 109  Temp: 99.7 F (37.6 C)  Resp: 18   Exam  General: Well developed, well nourished, NAD, appears stated age  HEENT: NCAT, mucous membranes moist.   Cardiovascular: S1 S2 auscultated, irregular  Respiratory: Clear to auscultation bilaterally  Abdomen: Soft, nontender, nondistended, + bowel sounds  Extremities: warm dry without cyanosis clubbing or edema  Neuro: AAOx3, nonfocal  Psych: Normal affect and demeanor with intact judgement and insight  Discharge Instructions      Discharge Instructions    Discharge instructions    Complete by:  As directed   Patient will be discharged. She will need to continue her medications as prescribed.  She will need to follow up with her primary care physician within one week of discharge.  Patient will need to followup with cardiology.  Patient to continue a heart healthy diet.  Continue activity as tolerated.  Follow a heart healthy diet.            Medication List    STOP taking these medications        hydrochlorothiazide 25 MG tablet  Commonly known as:  HYDRODIURIL     simvastatin 40 MG tablet  Commonly known as:  ZOCOR      TAKE these medications        alendronate 70 MG tablet  Commonly known as:  FOSAMAX  TAKE 1 TABLET BY MOUTH EVERY 7 DAYS WITH A FULL GLASS OF WATER ON AN EMPTY STOMACH     ALPRAZolam 0.5 MG tablet  Commonly known as:  XANAX  TAKE 1 TABLET BY MOUTH TWICE A DAY AS NEEDED     apixaban 5 MG Tabs tablet  Commonly known as:  ELIQUIS  Take 2 tablets (10 mg total) by mouth 2 (two) times daily.     apixaban 5 MG Tabs tablet  Commonly known as:  ELIQUIS  Take 1 tablet (5 mg total) by mouth 2 (two) times daily.  Start taking on:  03/05/2014     aspirin 81 MG EC tablet  Take 1 tablet (81 mg total) by mouth daily.     atorvastatin 20 MG tablet  Commonly known as:  LIPITOR  Take 1 tablet (20 mg total) by mouth daily at 6 PM.     benzonatate 200 MG capsule  Commonly known as:  TESSALON  Take 1 capsule (200  mg total) by mouth 3 (three) times daily as needed for cough.     diltiazem 120 MG 24 hr capsule  Commonly known as:  CARDIZEM CD  Take 1 capsule (120 mg total) by mouth daily.     HYDROcodone-acetaminophen 5-325 MG per tablet  Commonly known as:  NORCO/VICODIN  Take 1 tablet by mouth every 6 (six) hours as needed for moderate pain.     levofloxacin 750 MG tablet  Commonly known as:  LEVAQUIN  Take 1 tablet (750 mg total) by mouth daily.       No Known Allergies Follow-up Information    Follow up with Kirk Ruths, MD.   Specialty:  Cardiology   Why:  Office will call you for your followup appointment. Call office if you have not heard back in 3 days.   Contact information:   [address] [place] [id number] [phone]        The results of significant diagnostics from this hospitalization (including imaging,  microbiology, ancillary and laboratory) are listed below for reference.    Significant Diagnostic Studies: Dg Chest 2 View  02/23/2014   CLINICAL DATA:  Fever and dry cough for a few days.  EXAM: CHEST  2 VIEW  COMPARISON:  04/02/2012  FINDINGS: There is airspace consolidation in the right upper lobe posteriorly, also in the right middle lobe anteriorly. This likely represents pneumonia given the clinical setting. There are no effusions. There is marked hyperinflation.  IMPRESSION: Patchy consolidation in the right upper lobe and right middle lobe, likely pneumonia in this clinical setting. Recommend follow-up radiographs after treatment to confirm complete clearing.   Electronically Signed   By: Andreas Newport M.D.   On: 02/23/2014 19:09   Ct Head Wo Contrast  02/23/2014   CLINICAL DATA:  Generalized weakness, syncope  EXAM: CT HEAD WITHOUT CONTRAST  TECHNIQUE: Contiguous axial images were obtained from the base of the skull through the vertex without intravenous contrast.  COMPARISON:  04/02/2012  FINDINGS: No skull fracture is noted. Paranasal sinuses and mastoid air cells are unremarkable. No intracranial hemorrhage, mass effect or midline shift. No acute cortical infarction. No mass lesion is noted on this unenhanced scan. The gray and white-matter differentiation is preserved. Mild cerebral atrophy is stable. Atherosclerotic calcifications of carotid siphon are noted.  IMPRESSION: No acute intracranial abnormality.  Mild cerebral atrophy.   Electronically Signed   By: Lahoma Crocker M.D.   On: 02/23/2014 16:58   Ct Angio Chest Pe W/cm &/or Wo Cm  02/25/2014   CLINICAL DATA:  Right chest and back pain, weakness and fatigue. Symptoms for 2 weeks. Difficulty breathing and shortness of breath. V/Q scan intermediate probability for pulmonary embolus 02/24/2014.  EXAM: CT ANGIOGRAPHY CHEST WITH CONTRAST  TECHNIQUE: Multidetector CT imaging of the chest was performed using the standard protocol during bolus  administration of intravenous contrast. Multiplanar CT image reconstructions and MIPs were obtained to evaluate the vascular anatomy.  CONTRAST:  100 mL OMNIPAQUE IOHEXOL 350 MG/ML SOLN  COMPARISON:  VQ scan 02/24/2014 and PA and lateral chest 02/23/2014.  FINDINGS: The study is positive for pulmonary embolus. Largest embolus is seen in the right main pulmonary artery at its bifurcation and in the descending interlobar arteries on the right. There is no evidence of right heart strain. Mild cardiomegaly is identified. Small right pleural effusion is seen. No left pleural effusion or pericardial effusion. No axillary, hilar or mediastinal lymphadenopathy. Mild calcific aortic atherosclerosis is identified.  The lungs  demonstrate peripheral lobe airspace opacity in the superior segment of the right lower lobe. This is likely due to pulmonary infarct. Mild dependent atelectasis is seen on the left.  Incidentally imaged upper abdomen shows no focal abnormality.  Bone show a remote healed fracture of the superior aspect of the body of the sternum. No lytic or sclerotic bony lesion is identified.  Review of the MIP images confirms the above findings.  IMPRESSION: Study is positive for pulmonary embolus with about the seen in the distal right main pulmonary artery and descending right intralobar artery. No evidence of right heart strain is present.  Right lower lobe airspace disease most consistent with pulmonary infarct.  Critical Value/emergent results were called by telephone at the time of interpretation on 02/25/2014 at 2:41 pm to the patient's nurse, Bertell Maria, RN who verbally acknowledged these results.   Electronically Signed   By: Inge Rise M.D.   On: 02/25/2014 14:43   Nm Pulmonary Perf And Vent  02/24/2014   CLINICAL DATA:  Positive D-Dimer PT Hx of Breast CA, HLD, HTN, Pneumonia, Osteoporosis. Creatinine 1.26, No documented allergies.  EXAM: NUCLEAR MEDICINE VENTILATION - PERFUSION LUNG SCAN   TECHNIQUE: Ventilation images were obtained in multiple projections using inhaled aerosol technetium 99 M DTPA. Perfusion images were obtained in multiple projections after intravenous injection of Tc-3m MAA.  RADIOPHARMACEUTICALS:  40.0 mCi Tc-5m DTPA aerosol and 6.0 mCi Tc-56m MAA  COMPARISON:  Radiographs from previous day, demonstrating right middle and upper lobe patchy consolidation.  FINDINGS: Ventilation: Heterogeneous distribution, bases more than apices, without discrete ventilation defect.  Perfusion: There are subsegmental defects in the anterior and lateral basal segments of the right lower lobe, more conspicuous than seen on ventilation scintigraphy. These do not correlate with the opacities if seen on recent radiography.  IMPRESSION: 1. Intermediate likelihood ratio for pulmonary embolism. CTA chest with contrast may be useful for further elucidation of these findings.   Electronically Signed   By: Arne Cleveland M.D.   On: 02/24/2014 13:33    Microbiology: Recent Results (from the past 240 hour(s))  Blood culture (routine x 2)     Status: None (Preliminary result)   Collection Time: 02/23/14  7:41 PM  Result Value Ref Range Status   Specimen Description BLOOD LEFT FOREARM  Final   Special Requests BOTTLES DRAWN AEROBIC AND ANAEROBIC 5CC  Final   Culture   Final           BLOOD CULTURE RECEIVED NO GROWTH TO DATE CULTURE WILL BE HELD FOR 5 DAYS BEFORE ISSUING A FINAL NEGATIVE REPORT Performed at [facility]    Report Status PENDING  Incomplete  Blood culture (routine x 2)     Status: None (Preliminary result)   Collection Time: 02/23/14  7:50 PM  Result Value Ref Range Status   Specimen Description BLOOD LEFT HAND  Final   Special Requests BOTTLES DRAWN AEROBIC AND ANAEROBIC 5CC  Final   Culture   Final           BLOOD CULTURE RECEIVED NO GROWTH TO DATE CULTURE WILL BE HELD FOR 5 DAYS BEFORE ISSUING A FINAL NEGATIVE REPORT Performed at [facility]    Report  Status PENDING  Incomplete     Labs: Basic Metabolic Panel:  Recent Labs Lab 02/23/14 1519 02/23/14 2223 02/24/14 0400 02/25/14 0801 02/26/14 0443  NA 139  --  137 137 140  K 3.9  --  3.4* 3.0* 4.3  CL 102  --  104 102 106  CO2 27  --  24 24 25   GLUCOSE 159*  --  122* 160* 128*  BUN 38*  --  34* 28* 21  CREATININE 1.47*  --  1.26* 1.11* 1.11*  CALCIUM 8.4  --  8.1* 8.0* 8.5  MG  --  1.6  --   --   --    Liver Function Tests:  Recent Labs Lab 02/24/14 0400  AST 130*  ALT 151*  ALKPHOS 198*  BILITOT 1.6*  PROT 6.2  ALBUMIN 2.6*   No results for input(s): LIPASE, AMYLASE in the last 168 hours. No results for input(s): AMMONIA in the last 168 hours. CBC:  Recent Labs Lab 02/23/14 1519 02/24/14 0400 02/25/14 0801 02/26/14 0443  WBC 10.7* 8.8 8.9 8.3  NEUTROABS  --  5.9  --   --   HGB 12.2 11.3* 10.6* 10.4*  HCT 35.9* 33.9* 32.0* 31.7*  MCV 89.1 92.4 90.4 90.3  PLT 319 313 330 355   Cardiac Enzymes:  Recent Labs Lab 02/23/14 2223 02/24/14 0410 02/24/14 1104  TROPONINI <0.03 0.03 0.03   BNP: BNP (last 3 results) No results for input(s): PROBNP in the last 8760 hours. CBG: No results for input(s): GLUCAP in the last 168 hours.     SignedBRANDIS, WIXTED  [facility] 02/26/2014, 4:56 PM

## 2014-02-26 NOTE — Progress Notes (Signed)
Called ECHO lab and asked that vascular and echo be aware that pt is anticipating discharge. Pt resting with call bell within reach.  Will continue to monitor.  Payton Emerald, RN

## 2014-02-26 NOTE — Progress Notes (Signed)
Utilization Review Completed.Donne Anon T2/01/2014

## 2014-02-26 NOTE — Progress Notes (Signed)
Physical Therapy Treatment Patient Details Name: Tammy [name] MRN: [id number] DOB: 11-Dec-1931 Today's Date: 02/26/2014    History of Present Illness Patient is an 81 yo female admitted 02/23/14 with syncopal episode, Afib with RVR, and CAP.  PMH:  HTN, HLD, osteoporosis, back pain, PVD    PT Comments    Patient with improved balance today, especially with use of cane.  Patient has achieved all PT goals - PT will sign off.  Encouraged ambulation with nursing staff.  Patient has cane at home.  Will have 24 hour assist.  Do not feel patient needs any f/u PT.  Patient ready for d/c from PT perspective.   Follow Up Recommendations  No PT follow up;Supervision/Assistance - 24 hour     Equipment Recommendations  None recommended by PT (Patient has a cane at home)    Recommendations for Other Services       Precautions / Restrictions Precautions Precautions: Fall Restrictions Weight Bearing Restrictions: No    Mobility  Bed Mobility                  Transfers Overall transfer level: Modified independent Equipment used: None;Straight cane Transfers: Sit to/from Stand Sit to Stand: Modified independent (Device/Increase time)         General transfer comment: Stable today with sit <> stand.  No physical assist needed.  Ambulation/Gait Ambulation/Gait assistance: Supervision Ambulation Distance (Feet): 150 Feet Assistive device: None;Straight cane Gait Pattern/deviations: Step-through pattern;Decreased stride length;Staggering left;Staggering right Gait velocity: Decreased Gait velocity interpretation: Below normal speed for age/gender General Gait Details: Patient more steady with gait, however continues to stagger to both sides with no assistive device.  Instructed patient on safe use of cane.  Patient completed ambulation with cane.  Did stagger x1 and used cane to self-correct.  Recommended to patient to use cane at home for safety.   Stairs             Wheelchair Mobility    Modified Rankin (Stroke Patients Only)       Balance                                    Cognition Arousal/Alertness: Awake/alert Behavior During Therapy: WFL for tasks assessed/performed Overall Cognitive Status: Within Functional Limits for tasks assessed                      Exercises      General Comments        Pertinent Vitals/Pain Pain Assessment: No/denies pain    Home Living                      Prior Function            PT Goals (current goals can now be found in the care plan section) Progress towards PT goals: Goals met/education completed, patient discharged from PT    Frequency  Min 3X/week    PT Plan Discharge plan needs to be updated    Co-evaluation             End of Session Equipment Utilized During Treatment: Gait belt Activity Tolerance: Patient tolerated treatment well Patient left: in bed;with call bell/phone within reach;with family/visitor present (sitting EOB)     Time: 0786-7544 PT Time Calculation (min) (ACUTE ONLY): 15 min  Charges:  $Gait Training: 8-22 mins  G CodesDespina Pole 02/26/2014, 11:57 AM [name]. Sanjuana Kava, [place] Pager [phone]

## 2014-02-26 NOTE — Progress Notes (Signed)
Pt/family given discharge instructions, medication lists, follow up appointments, and when to call the doctor.  Pt/family verbalizes understanding. Nadie Fiumara McClintock, RN   

## 2014-02-26 NOTE — Discharge Instructions (Signed)
Information on my medicine - ELIQUIS (apixaban)  This medication education was reviewed with me or my healthcare representative as part of my discharge preparation.    Why was Eliquis prescribed for you? Eliquis was prescribed to treat blood clots that may have been found in the veins of your legs (deep vein thrombosis) or in your lungs (pulmonary embolism) and to reduce the risk of them occurring again.  What do You need to know about Eliquis ? The starting dose is 10 mg (two 5 mg tablets) taken TWICE daily for the FIRST SEVEN (7) DAYS, then on 03/04/2014  the dose is reduced to ONE 5 mg tablet taken TWICE daily.  Eliquis may be taken with or without food.   Try to take the dose about the same time in the morning and in the evening. If you have difficulty swallowing the tablet whole please discuss with your pharmacist how to take the medication safely.  Take Eliquis exactly as prescribed and DO NOT stop taking Eliquis without talking to the doctor who prescribed the medication.  Stopping may increase your risk of developing a new blood clot.  Refill your prescription before you run out.  After discharge, you should have regular check-up appointments with your healthcare provider that is prescribing your Eliquis.    What do you do if you miss a dose? If a dose of ELIQUIS is not taken at the scheduled time, take it as soon as possible on the same day and twice-daily administration should be resumed. The dose should not be doubled to make up for a missed dose.  Important Safety Information A possible side effect of Eliquis is bleeding. You should call your healthcare provider right away if you experience any of the following: ? Bleeding from an injury or your nose that does not stop. ? Unusual colored urine (red or dark brown) or unusual colored stools (red or black). ? Unusual bruising for unknown reasons. ? A serious fall or if you hit your head (even if there is no bleeding).  Some  medicines may interact with Eliquis and might increase your risk of bleeding or clotting while on Eliquis. To help avoid this, consult your healthcare provider or pharmacist prior to using any new prescription or non-prescription medications, including herbals, vitamins, non-steroidal anti-inflammatory drugs (NSAIDs) and supplements.  This website has more information on Eliquis (apixaban): http://www.eliquis.com/eliquis/home   Atrial Fibrillation Atrial fibrillation is a type of irregular heart rhythm (arrhythmia). During atrial fibrillation, the upper chambers of the heart (atria) quiver continuously in a chaotic pattern. This causes an irregular and often rapid heart rate.  Atrial fibrillation is the result of the heart becoming overloaded with disorganized signals that tell it to beat. These signals are normally released one at a time by a part of the right atrium called the sinoatrial node. They then travel from the atria to the lower chambers of the heart (ventricles), causing the atria and ventricles to contract and pump blood as they pass. In atrial fibrillation, parts of the atria outside of the sinoatrial node also release these signals. This results in two problems. First, the atria receive so many signals that they do not have time to fully contract. Second, the ventricles, which can only receive one signal at a time, beat irregularly and out of rhythm with the atria.  There are three types of atrial fibrillation:   Paroxysmal. Paroxysmal atrial fibrillation starts suddenly and stops on its own within a week.  Persistent. Persistent atrial fibrillation lasts  for more than a week. It may stop on its own or with treatment.  Permanent. Permanent atrial fibrillation does not go away. Episodes of atrial fibrillation may lead to permanent atrial fibrillation. Atrial fibrillation can prevent your heart from pumping blood normally. It increases your risk of stroke and can lead to heart failure.   CAUSES   Heart conditions, including a heart attack, heart failure, coronary artery disease, and heart valve conditions.   Inflammation of the sac that surrounds the heart (pericarditis).  Blockage of an artery in the lungs (pulmonary embolism).  Pneumonia or other infections.  Chronic lung disease.  Thyroid problems, especially if the thyroid is overactive (hyperthyroidism).  Caffeine, excessive alcohol use, and use of some illegal drugs.   Use of some medicines, including certain decongestants and diet pills.  Heart surgery.   Birth defects.  Sometimes, no cause can be found. When this happens, the atrial fibrillation is called lone atrial fibrillation. The risk of complications from atrial fibrillation increases if you have lone atrial fibrillation and you are age 1 years or older. RISK FACTORS  Heart failure.  Coronary artery disease.  Diabetes mellitus.   High blood pressure (hypertension).   Obesity.   Other arrhythmias.   Increased age. SIGNS AND SYMPTOMS   A feeling that your heart is beating rapidly or irregularly.   A feeling of discomfort or pain in your chest.   Shortness of breath.   Sudden light-headedness or weakness.   Getting tired easily when exercising.   Urinating more often than normal (mainly when atrial fibrillation first begins).  In paroxysmal atrial fibrillation, symptoms may start and suddenly stop. DIAGNOSIS  Your health care provider may be able to detect atrial fibrillation when taking your pulse. Your health care provider may have you take a test called an ambulatory electrocardiogram (ECG). An ECG records your heartbeat patterns over a 24-hour period. You may also have other tests, such as:  Transthoracic echocardiogram (TTE). During echocardiography, sound waves are used to evaluate how blood flows through your heart.  Transesophageal echocardiogram (TEE).  Stress test. There is more than one type of stress  test. If a stress test is needed, ask your health care provider about which type is best for you.  Chest X-ray exam.  Blood tests.  Computed tomography (CT). TREATMENT  Treatment may include:  Treating any underlying conditions. For example, if you have an overactive thyroid, treating the condition may correct atrial fibrillation.  Taking medicine. Medicines may be given to control a rapid heart rate or to prevent blood clots, heart failure, or a stroke.  Having a procedure to correct the rhythm of the heart:  Electrical cardioversion. During electrical cardioversion, a controlled, low-energy shock is delivered to the heart through your skin. If you have chest pain, very low blood pressure, or sudden heart failure, this procedure may need to be done as an emergency.  Catheter ablation. During this procedure, heart tissues that send the signals that cause atrial fibrillation are destroyed.  Surgical ablation. During this surgery, thin lines of heart tissue that carry the abnormal signals are destroyed. This procedure can either be an open-heart surgery or a minimally invasive surgery. With the minimally invasive surgery, small cuts are made to access the heart instead of a large opening.  Pulmonary venous isolation. During this surgery, tissue around the veins that carry blood from the lungs (pulmonary veins) is destroyed. This tissue is thought to carry the abnormal signals. HOME CARE INSTRUCTIONS   Take medicines  only as directed by your health care provider. Some medicines can make atrial fibrillation worse or recur.  If blood thinners were prescribed by your health care provider, take them exactly as directed. Too much blood-thinning medicine can cause bleeding. If you take too little, you will not have the needed protection against stroke and other problems.  Perform blood tests at home if directed by your health care provider. Perform blood tests exactly as directed.  Quit smoking  if you smoke.  Do not drink alcohol.  Do not drink caffeinated beverages such as coffee, soda, and some teas. You may drink decaffeinated coffee, soda, or tea.   Maintain a healthy weight.Do not use diet pills unless your health care provider approves. They may make heart problems worse.   Follow diet instructions as directed by your health care provider.  Exercise regularly as directed by your health care provider.  Keep all follow-up visits as directed by your health care provider. This is important. PREVENTION  The following substances can cause atrial fibrillation to recur:   Caffeinated beverages.  Alcohol.  Certain medicines, especially those used for breathing problems.  Certain herbs and herbal medicines, such as those containing ephedra or ginseng.  Illegal drugs, such as cocaine and amphetamines. Sometimes medicines are given to prevent atrial fibrillation from recurring. Proper treatment of any underlying condition is also important in helping prevent recurrence.  SEEK MEDICAL CARE IF:  You notice a change in the rate, rhythm, or strength of your heartbeat.  You suddenly begin urinating more frequently.  You tire more easily when exerting yourself or exercising. SEEK IMMEDIATE MEDICAL CARE IF:   You have chest pain, abdominal pain, sweating, or weakness.  You feel nauseous.  You have shortness of breath.  You suddenly have swollen feet and ankles.  You feel dizzy.  Your face or limbs feel numb or weak.  You have a change in your vision or speech. MAKE SURE YOU:   Understand these instructions.  Will watch your condition.  Will get help right away if you are not doing well or get worse. Document Released: 01/12/2005 Document Revised: 05/29/2013 Document Reviewed: 02/23/2012 [facility] Patient Information 2015 [place], [place]. This information is not intended to replace advice given to you by your health care provider. Make sure you discuss any  questions you have with your health care provider.

## 2014-02-26 NOTE — Progress Notes (Signed)
Echocardiogram 2D Echocardiogram has been performed.  Vrishank Moster 02/26/2014, 1:09 PM

## 2014-02-26 NOTE — Care Management Note (Signed)
    Page 1 of 1   02/26/2014     2:59:38 PM CARE MANAGEMENT NOTE 02/26/2014  Patient:  Tammy [name], Tammy [name]   Account Number:  [id number]  Date Initiated:  02/26/2014  Documentation initiated by:  Marvetta Gibbons  Subjective/Objective Assessment:   Pt admitted with CAP     Action/Plan:   PTA pt lived at home with spouse   Anticipated DC Date:  02/27/2014   Anticipated DC Plan:  [place]  CM consult  Medication Assistance      Choice offered to / List presented to:             Status of service:  Completed, signed off Medicare Important Message given?  YES (If response is "NO", the following Medicare IM given date fields will be blank) Date Medicare IM given:  02/26/2014 Medicare IM given by:  Marvetta Gibbons Date Additional Medicare IM given:   Additional Medicare IM given by:    Discharge Disposition:  HOME/SELF CARE  Per UR Regulation:  Reviewed for med. necessity/level of care/duration of stay  If discussed at [place] of Stay Meetings, dates discussed:    Comments:  02/26/14- 1200- Valentina Gu, BSN [phone] Pt for potential d/c today, started on Eliquis- benefits check completed- PER REP AT [employer]: ELIQUIS: 10MG  BID FOR 7 DAYS: COULD NOT PROVIDE INFO FOR THIS DOSAGE 5MG  BID FOR 30 DAYS: COVERED; TIER 3; $47.00 AT RETAIL; NO AUTH REQUIRED PATIENT CAN USE: ANY RETAIL PHARMACY spoke with pt and spouse at bedside- 30 day free card for Eliquis give- along with above info- per PT notes- no f/u needed- pt uses cane at home- [facility] referral placed- spouse open to any assistance/help  that they may qualify for with insurance

## 2014-02-26 NOTE — Progress Notes (Signed)
Medicare Important Message given? YES  (If response is "NO", the following Medicare IM given date fields will be blank)  Date Medicare IM given: 02/26/14 Medicare IM given by:  Lemarcus Baggerly  

## 2014-02-26 NOTE — Progress Notes (Addendum)
I have sent a message to our [place] office's scheduler requesting a follow-up appointment, and our office will call the patient with this information.  Per d/w Dr. Irish Lack, do not need to pursue EP w/u of syncope at this time as we have PE as a possible mechanism. Will continue to follow in the office as above. Would also recommend keeping the switch from Zocor to Atorva given interaction with diltiazem (see pharmacy note on index page), which we have consolidated to 120mg  daily. Await echo. Lucca Ballo PA-C

## 2014-02-26 NOTE — Progress Notes (Signed)
*  PRELIMINARY RESULTS* Vascular Ultrasound Lower extremity venous duplex has been completed.  Preliminary findings: Evidence of DVT involving the left femoral vein, popliteal vein, and posterior tibial vein. No DVT RLE, although superficial thrombosis is noted in the right greater saphenous vein extending from the mid thigh to distal calf.   Landry Mellow, RDMS, RVT  02/26/2014, 2:31 PM

## 2014-02-26 NOTE — Progress Notes (Signed)
ANTICOAGULATION CONSULT NOTE - Follow Up Consult  Pharmacy Consult for Apixaban Indication: atrial fibrillation and PE  No Known Allergies  Patient Measurements: Height: 5' 7" (170.2 cm) Weight: 155 lb 6.8 oz (70.5 kg) IBW/kg (Calculated) : 61.6  Vital Signs: Temp: 99.7 F (37.6 C) (02/01 0400) Temp Source: Oral (02/01 0400) BP: 118/55 mmHg (02/01 0400) Pulse Rate: 109 (02/01 0400)  Labs:  Recent Labs  02/23/14 1942 02/23/14 2223 02/24/14 0400 02/24/14 0410 02/24/14 0750 02/24/14 1104 02/24/14 1526 02/25/14 0325 02/25/14 0801 02/26/14 0443  HGB  --   --  11.3*  --   --   --   --   --  10.6* 10.4*  HCT  --   --  33.9*  --   --   --   --   --  32.0* 31.7*  PLT  --   --  313  --   --   --   --   --  330 355  LABPROT 15.4*  --   --   --   --   --   --   --   --   --   INR 1.21  --   --   --   --   --   --   --   --   --   HEPARINUNFRC  --   --   --   --  0.33  --  0.32 0.22*  --   --   CREATININE  --   --  1.26*  --   --   --   --   --  1.11* 1.11*  TROPONINI  --  <0.03  --  0.03  --  0.03  --   --   --   --     Estimated Creatinine Clearance: 39.3 mL/min (by C-G formula based on Cr of 1.11).   Medications:  Scheduled:  . apixaban  10 mg Oral BID   Followed by  . [START ON 03/05/2014] apixaban  5 mg Oral BID  . aspirin  81 mg Oral Daily  . atorvastatin  20 mg Oral q1800  . azithromycin  500 mg Intravenous Q24H  . cefTRIAXone (ROCEPHIN)  IV  1 g Intravenous Q24H  . diltiazem  30 mg Oral 4 times per day  . ibuprofen  400 mg Oral Once   Infusions:  . sodium chloride 50 mL/hr at 02/23/14 2308    Assessment: 81 yo F admitted 1/29 and found to have new onset afib. Heparin transitioned to Eliquis 43m BID on 1/31 for anticoagulation. Now CTA also revealed a PE, therefore will need to change Eliquis to VTE treatment dose. LE doppler pending. CBC stable, scr 1.11. crcl > 30 ml/min.  Pt's LFTs are slightly abnormal with Alb 2.6, AST/ALT 130/151, Alk Phos 198, T bili  1.6. Could this be related to statin therapy? She was on zocor 412mPTA, now on lipitor 2067m/t drug interaction with Diltiazem. Please f/u LFTs and hold statin if LFTs continue trending up.  Goal of Therapy:  Therapeutic anticoagulation Monitor platelets by anticoagulation protocol: Yes   Plan:  - Change Eliquis to 10 mg BID through 2/7, then switch to 5mg67mD on 2/8 - Monitor s/sx of bleeding. - f/u LFTs  Ebubechukwu Jedlicka Maryanna ShapearmD, BCPS  Clinical Pharmacist  Pager: [phone]/01/2014 9:58 AM

## 2014-02-26 NOTE — Consult Note (Signed)
Received referral from inpatient RNCM for [place] [facility] services. Informed that patient may discharge today. Therefore, called into patients room to speak with her and her husband about [place] [facility] services. Mrs Notaro asked that writer speak with her husband as she is sometimes a little forgetful. Spoke with Mr. Haskew who gave permission for [place] [facility]. Confirmed best contact information. Mr Charley states additional support is needed for keeping the medications straightened out since patient has had some medication changes. Made inpatient RNCM aware [facility] to follow.  Marthenia Rolling, MSN- [facility] [phone]

## 2014-02-26 NOTE — Progress Notes (Signed)
Patient: Tammy [name] / Admit Date: 02/23/2014 / Date of Encounter: 02/26/2014, 9:05 AM   Subjective: No complaints. No CP or SOB. She and her daughter were unaware that CTA was + for PE. She does report inactivity recently as she hadn't been feeling well. No recent surgery, travel, or history of blood clots.   Objective: Telemetry: wandering atrial pacemaker and rare MAT. Occasional PVCs, one couplet. Physical Exam: Blood pressure 118/55, pulse 109, temperature 99.7 F (37.6 C), temperature source Oral, resp. rate 18, height 5\' 7"  (1.702 m), weight 155 lb 6.8 oz (70.5 kg), SpO2 94 %. General: Well developed, well nourished WF in no acute distress Head: Normocephalic, atraumatic, sclera non-icteric, no xanthomas, nares are without discharge. Neck: Negative for carotid bruits. JVP not elevated. Lungs: Clear bilaterally to auscultation without wheezes, rales, or rhonchi. Breathing is unlabored. Heart: RRR S1 S2 without murmurs, rubs, or gallops.  Abdomen: Soft, non-tender, non-distended with normoactive bowel sounds. No rebound/guarding. Extremities: No clubbing or cyanosis. No edema. Distal pedal pulses are 2+ and equal bilaterally. Neuro: Alert and oriented X 3. Moves all extremities spontaneously. Psych:  Responds to questions appropriately with a normal affect.   Intake/Output Summary (Last 24 hours) at 02/26/14 0905 Last data filed at 02/26/14 0405  Gross per 24 hour  Intake      0 ml  Output    700 ml  Net   -700 ml    Inpatient Medications:  . apixaban  5 mg Oral BID  . aspirin  81 mg Oral Daily  . atorvastatin  20 mg Oral q1800  . azithromycin  500 mg Intravenous Q24H  . cefTRIAXone (ROCEPHIN)  IV  1 g Intravenous Q24H  . diltiazem  30 mg Oral 4 times per day  . ibuprofen  400 mg Oral Once   Infusions:  . sodium chloride 50 mL/hr at 02/23/14 2308    Labs:  Recent Labs  02/23/14 2223  02/25/14 0801 02/26/14 0443  NA  --   < > 137 140  K  --   < > 3.0* 4.3  CL   --   < > 102 106  CO2  --   < > 24 25  GLUCOSE  --   < > 160* 128*  BUN  --   < > 28* 21  CREATININE  --   < > 1.11* 1.11*  CALCIUM  --   < > 8.0* 8.5  MG 1.6  --   --   --   < > = values in this interval not displayed.  Recent Labs  02/24/14 0400  AST 130*  ALT 151*  ALKPHOS 198*  BILITOT 1.6*  PROT 6.2  ALBUMIN 2.6*    Recent Labs  02/24/14 0400 02/25/14 0801 02/26/14 0443  WBC 8.8 8.9 8.3  NEUTROABS 5.9  --   --   HGB 11.3* 10.6* 10.4*  HCT 33.9* 32.0* 31.7*  MCV 92.4 90.4 90.3  PLT 313 330 355    Recent Labs  02/23/14 2223 02/24/14 0410 02/24/14 1104  TROPONINI <0.03 0.03 0.03   Invalid input(s): POCBNP No results for input(s): HGBA1C in the last 72 hours.   Radiology/Studies:  Dg Chest 2 View  02/23/2014   CLINICAL DATA:  Fever and dry cough for a few days.  EXAM: CHEST  2 VIEW  COMPARISON:  04/02/2012  FINDINGS: There is airspace consolidation in the right upper lobe posteriorly, also in the right middle lobe anteriorly. This likely represents pneumonia given the  clinical setting. There are no effusions. There is marked hyperinflation.  IMPRESSION: Patchy consolidation in the right upper lobe and right middle lobe, likely pneumonia in this clinical setting. Recommend follow-up radiographs after treatment to confirm complete clearing.   Electronically Signed   By: Andreas Newport M.D.   On: 02/23/2014 19:09   Ct Head Wo Contrast  02/23/2014   CLINICAL DATA:  Generalized weakness, syncope  EXAM: CT HEAD WITHOUT CONTRAST  TECHNIQUE: Contiguous axial images were obtained from the base of the skull through the vertex without intravenous contrast.  COMPARISON:  04/02/2012  FINDINGS: No skull fracture is noted. Paranasal sinuses and mastoid air cells are unremarkable. No intracranial hemorrhage, mass effect or midline shift. No acute cortical infarction. No mass lesion is noted on this unenhanced scan. The gray and white-matter differentiation is preserved. Mild  cerebral atrophy is stable. Atherosclerotic calcifications of carotid siphon are noted.  IMPRESSION: No acute intracranial abnormality.  Mild cerebral atrophy.   Electronically Signed   By: Lahoma Crocker M.D.   On: 02/23/2014 16:58   Ct Angio Chest Pe W/cm &/or Wo Cm  02/25/2014   CLINICAL DATA:  Right chest and back pain, weakness and fatigue. Symptoms for 2 weeks. Difficulty breathing and shortness of breath. V/Q scan intermediate probability for pulmonary embolus 02/24/2014.  EXAM: CT ANGIOGRAPHY CHEST WITH CONTRAST  TECHNIQUE: Multidetector CT imaging of the chest was performed using the standard protocol during bolus administration of intravenous contrast. Multiplanar CT image reconstructions and MIPs were obtained to evaluate the vascular anatomy.  CONTRAST:  100 mL OMNIPAQUE IOHEXOL 350 MG/ML SOLN  COMPARISON:  VQ scan 02/24/2014 and PA and lateral chest 02/23/2014.  FINDINGS: The study is positive for pulmonary embolus. Largest embolus is seen in the right main pulmonary artery at its bifurcation and in the descending interlobar arteries on the right. There is no evidence of right heart strain. Mild cardiomegaly is identified. Small right pleural effusion is seen. No left pleural effusion or pericardial effusion. No axillary, hilar or mediastinal lymphadenopathy. Mild calcific aortic atherosclerosis is identified.  The lungs demonstrate peripheral lobe airspace opacity in the superior segment of the right lower lobe. This is likely due to pulmonary infarct. Mild dependent atelectasis is seen on the left.  Incidentally imaged upper abdomen shows no focal abnormality.  Bone show a remote healed fracture of the superior aspect of the body of the sternum. No lytic or sclerotic bony lesion is identified.  Review of the MIP images confirms the above findings.  IMPRESSION: Study is positive for pulmonary embolus with about the seen in the distal right main pulmonary artery and descending right intralobar artery. No  evidence of right heart strain is present.  Right lower lobe airspace disease most consistent with pulmonary infarct.  Critical Value/emergent results were called by telephone at the time of interpretation on 02/25/2014 at 2:41 pm to the patient's nurse, Bertell Maria, RN who verbally acknowledged these results.   Electronically Signed   By: Inge Rise M.D.   On: 02/25/2014 14:43   Nm Pulmonary Perf And Vent  02/24/2014   CLINICAL DATA:  Positive D-Dimer PT Hx of Breast CA, HLD, HTN, Pneumonia, Osteoporosis. Creatinine 1.26, No documented allergies.  EXAM: NUCLEAR MEDICINE VENTILATION - PERFUSION LUNG SCAN  TECHNIQUE: Ventilation images were obtained in multiple projections using inhaled aerosol technetium 99 M DTPA. Perfusion images were obtained in multiple projections after intravenous injection of Tc-69m MAA.  RADIOPHARMACEUTICALS:  40.0 mCi Tc-6m DTPA aerosol and 6.0 mCi Tc-58m  MAA  COMPARISON:  Radiographs from previous day, demonstrating right middle and upper lobe patchy consolidation.  FINDINGS: Ventilation: Heterogeneous distribution, bases more than apices, without discrete ventilation defect.  Perfusion: There are subsegmental defects in the anterior and lateral basal segments of the right lower lobe, more conspicuous than seen on ventilation scintigraphy. These do not correlate with the opacities if seen on recent radiography.  IMPRESSION: 1. Intermediate likelihood ratio for pulmonary embolism. CTA chest with contrast may be useful for further elucidation of these findings.   Electronically Signed   By: Arne Cleveland M.D.   On: 02/24/2014 13:33     Assessment and Plan  1. Pulmonary embolism with RLL airspace disease c/w pulmonary infarct - have edited the pharmacy consult for Eliquis to request help with PE dosing - no evidence of R heart strain by CT - await echo  2. ?Community acquired PNA - now it is not clear to me if the airspace disease was d/t pulm infarct - Tmax in last  24hrs - 100*F - Per IM  3. Irregular heart rhythm - this has been reported as atrial fibrillation in prior notes but monitor just shows wandering atrial pacemaker and multifocal atrial tachycardia. EKGs also demonstrate P wave activity.  - will ask MD to review as well - if this is not atrial fibrillation, then Eliquis would be for PE use only  - suspect incited by pulmonary embolism - consider consolidating to once daily dosing  4. Syncope upon admission - Prior episode of syncope back in 2014 preceded by nausea/flushing, felt autonomically mediated but EKG did show 1st degree AVB and RBBB - loop was offered but was deferred pending recurrent event - will discuss plans for workup with MD - may be helpful for EP to revisit this. It was felt that if she had another syncopal event that was unexplained, then she should have an implanted loop recorder. This may be explained by her PE but will d/w MD.  5. H/o RV dysfunction/severe TR - 2D echo pending  6. CKD stage III 7. HTN 8. Hyperlipidemia  Signed, Melina Copa PA-C  I have examined the patient and reviewed assessment and plan and discussed with patient.  Agree with above as stated.  No AFib noted.  She has wandering atrial pacemaker.  Echo pending.  LE Dopplers today.  WOuld base the duration oof anticoagulation on her PE and subsequent risk of recurrence.    VARANASI,JAYADEEP S.

## 2014-03-01 ENCOUNTER — Telehealth: Payer: Self-pay | Admitting: Cardiology

## 2014-03-02 LAB — CULTURE, BLOOD (ROUTINE X 2)
CULTURE: NO GROWTH
Culture: NO GROWTH

## 2014-03-07 NOTE — Telephone Encounter (Signed)
Closed encounter °

## 2014-03-08 ENCOUNTER — Ambulatory Visit (INDEPENDENT_AMBULATORY_CARE_PROVIDER_SITE_OTHER): Payer: Commercial Managed Care - HMO | Admitting: Internal Medicine

## 2014-03-08 ENCOUNTER — Encounter: Payer: Self-pay | Admitting: Internal Medicine

## 2014-03-08 VITALS — BP 142/76 | HR 79 | Temp 98.4°F | Resp 20 | Ht 67.0 in | Wt 177.0 lb

## 2014-03-08 DIAGNOSIS — I2699 Other pulmonary embolism without acute cor pulmonale: Secondary | ICD-10-CM | POA: Insufficient documentation

## 2014-03-08 DIAGNOSIS — E785 Hyperlipidemia, unspecified: Secondary | ICD-10-CM | POA: Diagnosis not present

## 2014-03-08 DIAGNOSIS — I10 Essential (primary) hypertension: Secondary | ICD-10-CM

## 2014-03-08 NOTE — Patient Instructions (Signed)
Limit your sodium (Salt) intake  Return in 3 months for follow-up   

## 2014-03-08 NOTE — Progress Notes (Signed)
Subjective:    Patient ID: Tammy [name], female    DOB: 04-13-1931, 81 y.o.   MRN: [id number]  HPI Discharge Diagnoses:  Principal Problem:  Syncope Active Problems:  Dyslipidemia  Essential hypertension  PAD (peripheral artery disease)  Severe tricuspid regurgitation by prior echocardiogram  Pulmonary hypertension  Syncope and collapse  CAP (community acquired pneumonia)  Cough  Positive D dimer    Hospital Course:  MAT/Wandering atrial pacemaker/Atrial tachycardia vs Afib -CHADSVASC 5 -Initially on heparin and cardizem drips, weaned off -Cardiology consulted and appreciated -Echocardiogram EF 09-32%, grade 1 diastolic dysfunction -troponins cycled and negative  -If patient does not convert, may need TEE/cardioversion on Monday, per cardiology note on 02/23/2014, however, spoke with Dr. Harl Bowie who feels patient should be medically treated at this time with Diltiazem, and has transitioned patient to Eliquis -TSH 4.017  Elevated DDimer/Acute pulmonary embolus -V/Q scan shows intermediate risk -likely secondary to above -Currently no complaints of SOB -CTA chest: Positive for pulmonary embolus seen in the distal right main pulmonary artery and descending right intralobar artery -Continue Eliquis -Lower extremity doppler: Left DVT- femoral, popliteal, posterior tibal veins. No RLE DVT.   81 year old patient who is seen following a recent hospital discharge.  She presented with syncope and noted to have an acute pulmonary embolism with infarction.  Hospital course associated with atrial tachyarrhythmias and some initial concern for atrial fibrillation. Lower extremity venous Doppler studies confirmed left leg DVT.  Since her discharge she has done quite well.  Denies any pulmonary complaints  Hospital records reviewed Patient has completed antibiotic therapy for possible community acquired pneumonia  She has a history of dyslipidemia and essential hypertension.   Now is on anticoagulation  Past Medical History  Diagnosis Date  . BREAST BIOPSY, HX OF 06/10/2006  . HIP PAIN, LEFT 01/31/2007  . HYPERLIPIDEMIA 06/10/2006  . HYPERTENSION 06/10/2006  . NEOPLASMS UNSPEC NATURE BONE SOFT TISSUE&SKIN 05/31/2008  . OSTEOPOROSIS 06/10/2006  . Impaired glucose tolerance   . Breast cyst     History   Social History  . Marital Status: Married    Spouse Name: N/A  . Number of Children: 4  . Years of Education: N/A   Occupational History  . Not on file.   Social History Main Topics  . Smoking status: Never Smoker   . Smokeless tobacco: Current User    Types: Snuff  . Alcohol Use: No  . Drug Use: No  . Sexual Activity: Yes   Other Topics Concern  . Not on file   Social History Narrative    Past Surgical History  Procedure Laterality Date  . Appendectomy    . Abdominal hysterectomy    . Breast surgery      bx x2  . Cholecystectomy  1980    Family History  Problem Relation [name] of Onset  . Heart disease Mother     Pacemaker  . Heart disease Father   . Asthma Father   . Diabetes Father   . Cancer Maternal Aunt     breast  . Cancer Sister   . Heart disease Sister   . Hyperlipidemia Sister   . Hypertension Sister     No Known Allergies  Current Outpatient Prescriptions on File Prior to Visit  Medication Sig Dispense Refill  . alendronate (FOSAMAX) 70 MG tablet TAKE 1 TABLET BY MOUTH EVERY 7 DAYS WITH A FULL GLASS OF WATER ON AN EMPTY STOMACH (Patient taking differently: Take 70 mg by mouth every Wednesday. TAKE  1 TABLET BY MOUTH EVERY 7 DAYS WITH A FULL GLASS OF WATER ON AN EMPTY STOMACH) 12 tablet 3  . ALPRAZolam (XANAX) 0.5 MG tablet TAKE 1 TABLET BY MOUTH TWICE A DAY AS NEEDED (Patient taking differently: Take 0.5 mg by mouth at bedtime. TAKE 1 TABLET BY MOUTH TWICE A DAY AS NEEDED) 60 tablet 5  . apixaban (ELIQUIS) 5 MG TABS tablet Take 1 tablet (5 mg total) by mouth 2 (two) times daily. 60 tablet 0  . aspirin EC 81 MG EC tablet  Take 1 tablet (81 mg total) by mouth daily. 30 tablet 00  . atorvastatin (LIPITOR) 20 MG tablet Take 1 tablet (20 mg total) by mouth daily at 6 PM. 30 tablet 0  . diltiazem (CARDIZEM CD) 120 MG 24 hr capsule Take 1 capsule (120 mg total) by mouth daily. 30 capsule 0   No current facility-administered medications on file prior to visit.    BP 142/76 mmHg  Pulse 79  Temp(Src) 98.4 F (36.9 C) (Oral)  Resp 20  Ht 5\' 7"  (1.702 m)  Wt 177 lb (80.287 kg)  BMI 27.72 kg/m2  SpO2 96%     Review of Systems  Constitutional: Negative.   HENT: Negative for congestion, dental problem, hearing loss, rhinorrhea, sinus pressure, sore throat and tinnitus.   Eyes: Negative for pain, discharge and visual disturbance.  Respiratory: Negative for cough and shortness of breath.   Cardiovascular: Positive for leg swelling. Negative for chest pain and palpitations.  Gastrointestinal: Negative for nausea, vomiting, abdominal pain, diarrhea, constipation, blood in stool and abdominal distention.  Genitourinary: Negative for dysuria, urgency, frequency, hematuria, flank pain, vaginal bleeding, vaginal discharge, difficulty urinating, vaginal pain and pelvic pain.  Musculoskeletal: Negative for joint swelling, arthralgias and gait problem.  Skin: Negative for rash.  Neurological: Positive for syncope. Negative for dizziness, speech difficulty, weakness, numbness and headaches.  Hematological: Negative for adenopathy.  Psychiatric/Behavioral: Negative for behavioral problems, dysphoric mood and agitation. The patient is not nervous/anxious.        Objective:   Physical Exam  Constitutional: She is oriented to person, place, and time. She appears well-developed and well-nourished.  HENT:  Head: Normocephalic.  Right Ear: External ear normal.  Left Ear: External ear normal.  Mouth/Throat: Oropharynx is clear and moist.  Eyes: Conjunctivae and EOM are normal. Pupils are equal, round, and reactive to light.   Neck: Normal range of motion. Neck supple. No thyromegaly present.  Cardiovascular: Normal rate, regular rhythm and normal heart sounds.   Pedal pulses absent except for a full right dorsalis pedis pulse  Pulmonary/Chest: Effort normal and breath sounds normal.  O2 saturation 96%  Abdominal: Soft. Bowel sounds are normal. She exhibits no mass. There is no tenderness.  Musculoskeletal: Normal range of motion. She exhibits edema.  Left lower leg edema  Lymphadenopathy:    She has no cervical adenopathy.  Neurological: She is alert and oriented to person, place, and time.  Skin: Skin is warm and dry. No rash noted.  Psychiatric: She has a normal mood and affect. Her behavior is normal.          Assessment & Plan:   Status post recent hospital admission for syncope, probably secondary to acute pulmonary embolism Acute pulmonary embolism with pulmonary infarction.  Will complete 6 months of anticoagulation therapy History of atrial tachyarrhythmias secondary to acute pulmonary embolism.  Presently stable Essential hypertension, well-controlled Left leg DVT.  Continue 6 months of anticoagulation PAD  Cardiology follow-up as planned  Return in 3 months for follow-up

## 2014-03-09 ENCOUNTER — Telehealth: Payer: Self-pay | Admitting: Internal Medicine

## 2014-03-09 NOTE — Telephone Encounter (Signed)
emmi mailed  °

## 2014-03-13 ENCOUNTER — Encounter: Payer: Self-pay | Admitting: Vascular Surgery

## 2014-03-14 ENCOUNTER — Encounter: Payer: Self-pay | Admitting: Vascular Surgery

## 2014-03-14 ENCOUNTER — Ambulatory Visit (INDEPENDENT_AMBULATORY_CARE_PROVIDER_SITE_OTHER): Payer: Commercial Managed Care - HMO | Admitting: Vascular Surgery

## 2014-03-14 ENCOUNTER — Ambulatory Visit (HOSPITAL_COMMUNITY)
Admission: RE | Admit: 2014-03-14 | Discharge: 2014-03-14 | Disposition: A | Discharge status: Home / Self Care | Payer: Commercial Managed Care - HMO | Source: Ambulatory Visit | Attending: Vascular Surgery | Admitting: Vascular Surgery

## 2014-03-14 VITALS — BP 141/60 | HR 67 | Ht 67.0 in | Wt 181.1 lb

## 2014-03-14 DIAGNOSIS — I70219 Atherosclerosis of native arteries of extremities with intermittent claudication, unspecified extremity: Secondary | ICD-10-CM

## 2014-03-14 DIAGNOSIS — I82402 Acute embolism and thrombosis of unspecified deep veins of left lower extremity: Secondary | ICD-10-CM | POA: Insufficient documentation

## 2014-03-14 DIAGNOSIS — I739 Peripheral vascular disease, unspecified: Secondary | ICD-10-CM

## 2014-03-14 NOTE — Progress Notes (Signed)
[facility]  Patient name: Tammy [name] MRN: [id number] DOB: Feb 19, 1931 Sex: female  REASON FOR VISIT: Follow up of peripheral vascular disease  HPI: Tammy [name] is a 81 y.o. female who I last saw on 09/06/2013. She has a history of claudication in the left calf. At the time of this last visit, she had an ABI of 100% on the right and 56% on the left. We discussed the importance of a structured walking program. She was not a smoker. I arranged to follow up visit in 6 months.  Since I saw her last she was just recently diagnosed with a DVT involving the left femoral vein left popliteal vein and left posterior tibial vein. She also had superficial venous thrombosis of the right greater saphenous vein. This study was done on 02/26/2014. He also had a pulmonary embolus diagnosed. She was started on Eliquis. She has not had significant pain in the left leg since this hospitalization.  She denies any claudication in the left leg although she states that because of the weather her activity has been limited. She denies any history of rest pain or nonhealing ulcers.   Past Medical History  Diagnosis Date  . BREAST BIOPSY, HX OF 06/10/2006  . HIP PAIN, LEFT 01/31/2007  . HYPERLIPIDEMIA 06/10/2006  . HYPERTENSION 06/10/2006  . NEOPLASMS UNSPEC NATURE BONE SOFT TISSUE&SKIN 05/31/2008  . OSTEOPOROSIS 06/10/2006  . Impaired glucose tolerance   . Breast cyst    Family History  Problem Relation Age of Onset  . Heart disease Mother     Pacemaker  . Heart disease Father   . Asthma Father   . Diabetes Father   . Cancer Maternal Aunt     breast  . Cancer Sister   . Heart disease Sister   . Hyperlipidemia Sister   . Hypertension Sister    SOCIAL HISTORY: History  Substance Use Topics  . Smoking status: Never Smoker   . Smokeless tobacco: Current User    Types: Snuff  . Alcohol Use: No   No Known Allergies Current Outpatient Prescriptions  Medication Sig Dispense  Refill  . alendronate (FOSAMAX) 70 MG tablet TAKE 1 TABLET BY MOUTH EVERY 7 DAYS WITH A FULL GLASS OF WATER ON AN EMPTY STOMACH (Patient taking differently: Take 70 mg by mouth every Wednesday. TAKE 1 TABLET BY MOUTH EVERY 7 DAYS WITH A FULL GLASS OF WATER ON AN EMPTY STOMACH) 12 tablet 3  . ALPRAZolam (XANAX) 0.5 MG tablet TAKE 1 TABLET BY MOUTH TWICE A DAY AS NEEDED (Patient taking differently: Take 0.5 mg by mouth at bedtime. TAKE 1 TABLET BY MOUTH TWICE A DAY AS NEEDED) 60 tablet 5  . apixaban (ELIQUIS) 5 MG TABS tablet Take 1 tablet (5 mg total) by mouth 2 (two) times daily. 60 tablet 0  . aspirin EC 81 MG EC tablet Take 1 tablet (81 mg total) by mouth daily. 30 tablet 00  . atorvastatin (LIPITOR) 20 MG tablet Take 1 tablet (20 mg total) by mouth daily at 6 PM. 30 tablet 0  . diltiazem (CARDIZEM CD) 120 MG 24 hr capsule Take 1 capsule (120 mg total) by mouth daily. 30 capsule 0   No current facility-administered medications for this visit.   REVIEW OF SYSTEMS: Valu.Nieves ] denotes positive finding; [  ] denotes negative finding  CARDIOVASCULAR:  [ ]  chest pain   [ ]  chest pressure   [ ]  palpitations   [ ]  orthopnea   [ ]   dyspnea on exertion   Valu.Nieves ] claudication   [ ]  rest pain   Valu.Nieves ] DVT   [ ]  phlebitis PULMONARY:   [ ]  productive cough   [ ]  asthma   [ ]  wheezing NEUROLOGIC:   [ ]  weakness  [ ]  paresthesias  [ ]  aphasia  [ ]  amaurosis  [ ]  dizziness HEMATOLOGIC:   [ ]  bleeding problems   [ ]  clotting disorders MUSCULOSKELETAL:  [ ]  joint pain   [ ]  joint swelling [ ]  leg swelling GASTROINTESTINAL: [ ]   blood in stool  [ ]   hematemesis GENITOURINARY:  [ ]   dysuria  [ ]   hematuria PSYCHIATRIC:  [ ]  history of major depression INTEGUMENTARY:  [ ]  rashes  [ ]  ulcers CONSTITUTIONAL:  [ ]  fever   [ ]  chills  PHYSICAL EXAM: Filed Vitals:   03/14/14 1427  BP: 141/60  Pulse: 67  Height: 5\' 7"  (1.702 m)  Weight: 181 lb 1.6 oz (82.146 kg)  SpO2: 100%   Body mass index is 28.36 kg/(m^2). GENERAL:  The patient is a well-nourished female, in no acute distress. The vital signs are documented above. CARDIOVASCULAR: There is a regular rate and rhythm. I do not detect carotid bruits. She has palpable femoral pulses. She has a right dorsalis pedis pulse. I cannot palpate pedal pulses on the left. PULMONARY: There is good air exchange bilaterally without wheezing or rales. ABDOMEN: Soft and non-tender with normal pitched bowel sounds.  MUSCULOSKELETAL: There are no major deformities or cyanosis. NEUROLOGIC: No focal weakness or paresthesias are detected. SKIN: There are no ulcers or rashes noted. She does have telangiectasias of both feet and evidence of chronic venous insufficiency. PSYCHIATRIC: The patient has a normal affect.  DATA:  ARTERIAL DOPPLER STUDY: I have independently interpreted her arterial Doppler study today which shows triphasic Doppler signals in the dorsalis pedis and posterior tibial positions on the right. On the left side she has monophasic signals in the dorsalis pedis and posterior tibial position. ABIs were not obtained as she had a DVT of the left lower extremity diagnosed this month. However toe pressure on the right was 128 mmHg and toe pressure on the left was 80 mmHg.  I reviewed her duplex scan which was done at [facility] on 02/26/2014 and shows a deep venous thrombosis of the left femoral vein left popliteal vein and left posterior tibial vein. There was also thrombus in the right greater saphenous vein.  MEDICAL ISSUES:  PERIPHERAL VASCULAR DISEASE WITH CLAUDICATION: The patient has stable claudication of the left lower extremity. She is not a smoker. I have encouraged her to stay as active as possible. Her symptoms are stable and I think it is safe to stretcher follow up out to 1 year. I have ordered follow up ABIs in 1 year area I will see her back at that time. She knows to call sooner if she has problems.  LEFT LOWER EXTREMITY DVT: The patient was diagnosed  with a DVT of the left lower extremity and pulmonary embolus during a hospitalization in late January. She is on Eliquis. She is to have a follow up duplex scan in 6 months and then her primary care physician will decide on whether or not to continue her Eliquis.    Return in about 1 year (around 03/15/2015).  [place] [facility] [place] [facility]: [phone]

## 2014-03-14 NOTE — Addendum Note (Signed)
Addended by: Dorthula Rue L on: 03/14/2014 04:10 PM   Modules accepted: Orders

## 2014-03-20 NOTE — Progress Notes (Signed)
HPI: FU syncope. Patient with history of syncope felt secondary to autonomic dysfunction. Previously loop monitor considered; Dr Lovena Le then felt it would be indicated if recurrent syncope. Echocardiogram March 2014 showed normal LV function, mild mitral regurgitation, right-sided enlargement and severe tricuspid regurgitation. Severely elevated pulmonary pressure. ABIs April 2015 showed left popliteal occlusion; left ABI in the moderate range. Recently admitted with syncope. Noted to be in atrial fibrillation at time of admission. Echo 2/16 showed normal LV function; trace AI; mild MR; mild LAE; severe right atrial and right ventricular enlargement, severe tricuspid regurgitation and moderately elevated pulmonary pressure. CTA showed pulmonary embolus. Since discharge she denies dyspnea, chest pain, palpitations or syncope. No bleeding. She has developed bilateral lower extremity edema.  Current Outpatient Prescriptions  Medication Sig Dispense Refill  . alendronate (FOSAMAX) 70 MG tablet TAKE 1 TABLET BY MOUTH EVERY 7 DAYS WITH A FULL GLASS OF WATER ON AN EMPTY STOMACH (Patient taking differently: Take 70 mg by mouth every Wednesday. TAKE 1 TABLET BY MOUTH EVERY 7 DAYS WITH A FULL GLASS OF WATER ON AN EMPTY STOMACH) 12 tablet 3  . ALPRAZolam (XANAX) 0.5 MG tablet TAKE 1 TABLET BY MOUTH TWICE A DAY AS NEEDED (Patient taking differently: Take 0.5 mg by mouth at bedtime. TAKE 1 TABLET BY MOUTH TWICE A DAY AS NEEDED) 60 tablet 5  . apixaban (ELIQUIS) 5 MG TABS tablet Take 1 tablet (5 mg total) by mouth 2 (two) times daily. 60 tablet 0  . aspirin EC 81 MG EC tablet Take 1 tablet (81 mg total) by mouth daily. 30 tablet 00  . atorvastatin (LIPITOR) 20 MG tablet Take 1 tablet (20 mg total) by mouth daily at 6 PM. 30 tablet 0  . diltiazem (CARDIZEM CD) 120 MG 24 hr capsule Take 1 capsule (120 mg total) by mouth daily. 30 capsule 0   No current facility-administered medications for this visit.      Past Medical History  Diagnosis Date  . BREAST BIOPSY, HX OF 06/10/2006  . HIP PAIN, LEFT 01/31/2007  . HYPERLIPIDEMIA 06/10/2006  . HYPERTENSION 06/10/2006  . NEOPLASMS UNSPEC NATURE BONE SOFT TISSUE&SKIN 05/31/2008  . OSTEOPOROSIS 06/10/2006  . Impaired glucose tolerance   . Breast cyst     Past Surgical History  Procedure Laterality Date  . Appendectomy    . Abdominal hysterectomy    . Breast surgery      bx x2  . Cholecystectomy  1980    History   Social History  . Marital Status: Married    Spouse Name: N/A  . Number of Children: 4  . Years of Education: N/A   Occupational History  . Not on file.   Social History Main Topics  . Smoking status: Never Smoker   . Smokeless tobacco: Current User    Types: Snuff  . Alcohol Use: No  . Drug Use: No  . Sexual Activity: Yes   Other Topics Concern  . Not on file   Social History Narrative    ROS: no fevers or chills, productive cough, hemoptysis, dysphasia, odynophagia, melena, hematochezia, dysuria, hematuria, rash, seizure activity, orthopnea, PND, claudication. Remaining systems are negative.  Physical Exam: Well-developed well-nourished in no acute distress.  Skin is warm and dry.  HEENT is normal.  Neck is supple.  Chest is clear to auscultation with normal expansion.  Cardiovascular exam is regular rate and rhythm.  Abdominal exam nontender or distended. No masses palpated. Extremities show 1+ edema. neuro grossly intact  ECG sinus rhythm, incomplete right bundle branch block, occasional PACs.

## 2014-03-21 ENCOUNTER — Ambulatory Visit (INDEPENDENT_AMBULATORY_CARE_PROVIDER_SITE_OTHER): Payer: Commercial Managed Care - HMO | Admitting: Cardiology

## 2014-03-21 ENCOUNTER — Encounter: Payer: Self-pay | Admitting: Cardiology

## 2014-03-21 VITALS — BP 148/78 | HR 76 | Ht 67.0 in | Wt 182.9 lb

## 2014-03-21 DIAGNOSIS — I480 Paroxysmal atrial fibrillation: Secondary | ICD-10-CM | POA: Diagnosis not present

## 2014-03-21 DIAGNOSIS — I10 Essential (primary) hypertension: Secondary | ICD-10-CM

## 2014-03-21 DIAGNOSIS — I739 Peripheral vascular disease, unspecified: Secondary | ICD-10-CM | POA: Diagnosis not present

## 2014-03-21 DIAGNOSIS — I2699 Other pulmonary embolism without acute cor pulmonale: Secondary | ICD-10-CM | POA: Diagnosis not present

## 2014-03-21 DIAGNOSIS — I071 Rheumatic tricuspid insufficiency: Secondary | ICD-10-CM

## 2014-03-21 DIAGNOSIS — I270 Primary pulmonary hypertension: Secondary | ICD-10-CM

## 2014-03-21 DIAGNOSIS — I4891 Unspecified atrial fibrillation: Secondary | ICD-10-CM | POA: Insufficient documentation

## 2014-03-21 DIAGNOSIS — I272 Pulmonary hypertension, unspecified: Secondary | ICD-10-CM

## 2014-03-21 DIAGNOSIS — R55 Syncope and collapse: Secondary | ICD-10-CM | POA: Diagnosis not present

## 2014-03-21 MED ORDER — FUROSEMIDE 20 MG PO TABS: 20.0000 mg | ORAL_TABLET | ORAL | Status: DC

## 2014-03-21 NOTE — Assessment & Plan Note (Signed)
There was a question of atrial fibrillation during a recent admission. However in reviewing notes it was felt more likely to be multifocal atrial tachycardia. Continue Cardizem for now.

## 2014-03-21 NOTE — Assessment & Plan Note (Signed)
Most likely related to right heart dilatation. She does have edema in her lower extremities. Add Lasix as described above.

## 2014-03-21 NOTE — Assessment & Plan Note (Signed)
Continue statin. 

## 2014-03-21 NOTE — Assessment & Plan Note (Signed)
Continue apixaban; DC ASA. Given history of pulmonary hypertension, right-sided enlargement and severe tricuspid regurgitation I think she would most likely be at risk for recurrent events. I would favor long-term anticoagulation.

## 2014-03-21 NOTE — Patient Instructions (Signed)
Your physician recommends that you schedule a follow-up appointment in: [place]  START FUROSEMIDE [place]   Your physician recommends that you return for lab work in: [place]

## 2014-03-21 NOTE — Assessment & Plan Note (Signed)
Etiology unclear. Question contribution from previous thromboembolic disease. Continue anticoagulation. No history of COPD or tobacco use. Previous CT did not suggest significant lung disease. She may require pulmonary functions in the future. Check sedimentation rate, rheumatoid factor and ANA.

## 2014-03-21 NOTE — Assessment & Plan Note (Signed)
Blood pressure elevated.she has bilateral lower extremity edema possibly from right heart failure and pulmonary hypertension. Add Lasix 20 mg every other day. Check potassium, renal function and BNP in 1 week. She did have renal insufficiency in the hospital that improved.

## 2014-03-21 NOTE — Assessment & Plan Note (Signed)
Recent syncopal episode possibly related to pulmonary embolus. No recurrence since discharge. I have asked her not to drive for 6 months.

## 2014-03-21 NOTE — Assessment & Plan Note (Signed)
Continue statin. Patient does not have claudication.

## 2014-03-26 ENCOUNTER — Telehealth: Payer: Self-pay | Admitting: Cardiology

## 2014-03-26 NOTE — Telephone Encounter (Addendum)
Returned call to pt. She was seen by [facility] Wednesday, put on low-dose Lasix for BLE edema.  She states started taking on Thursday, since then leg swelling has not diminished, no noticed increase in UOP, and now she is having redness & itching to abdomen & legs that started over the weekend.  Request advisement on alternatives for this pt.

## 2014-03-26 NOTE — Telephone Encounter (Signed)
DC lasix; spironolactone 25 mg daily; bmet 03/29/14 Tammy [name]

## 2014-03-26 NOTE — Telephone Encounter (Signed)
Pt c/o swelling: STAT is pt has developed SOB within 24 hours  1. How long have you been experiencing swelling? About 3 days  2. Where is the swelling located? Lower part of her legs  3.  Are you currently taking a "fluid pill"?Yes , Dr. Stanford Breed prescribed it on 2/24  4.  Are you currently SOB? no  5.  Have you traveled recently?no  *Pt says that her legs and abdomin are also red and itchy*

## 2014-03-27 ENCOUNTER — Other Ambulatory Visit: Payer: Self-pay | Admitting: Internal Medicine

## 2014-03-27 MED ORDER — SPIRONOLACTONE 25 MG PO TABS: 25.0000 mg | ORAL_TABLET | Freq: Every day | ORAL | Status: DC

## 2014-03-27 NOTE — Telephone Encounter (Signed)
Spoke with pt, Aware of dr crenshaw's recommendations.  °

## 2014-03-30 ENCOUNTER — Other Ambulatory Visit: Payer: Self-pay | Admitting: Internal Medicine

## 2014-03-30 ENCOUNTER — Telehealth: Payer: Self-pay | Admitting: *Deleted

## 2014-03-30 DIAGNOSIS — I4891 Unspecified atrial fibrillation: Secondary | ICD-10-CM | POA: Diagnosis not present

## 2014-03-30 DIAGNOSIS — I270 Primary pulmonary hypertension: Secondary | ICD-10-CM | POA: Diagnosis not present

## 2014-03-30 DIAGNOSIS — R55 Syncope and collapse: Secondary | ICD-10-CM | POA: Diagnosis not present

## 2014-03-30 LAB — BASIC METABOLIC PANEL WITH GFR
BUN: 19 mg/dL (ref 6–23)
CO2: 25 mEq/L (ref 19–32)
Calcium: 9.6 mg/dL (ref 8.4–10.5)
Chloride: 106 mEq/L (ref 96–112)
Creat: 0.96 mg/dL (ref 0.50–1.10)
GFR, Est African American: 65 mL/min
GFR, Est Non African American: 56 mL/min — ABNORMAL LOW
GLUCOSE: 117 mg/dL — AB (ref 70–99)
Potassium: 4.3 mEq/L (ref 3.5–5.3)
SODIUM: 142 meq/L (ref 135–145)

## 2014-03-30 LAB — RHEUMATOID FACTOR

## 2014-03-30 LAB — BRAIN NATRIURETIC PEPTIDE: Brain Natriuretic Peptide: 131.1 pg/mL — ABNORMAL HIGH (ref 0.0–100.0)

## 2014-03-30 NOTE — Telephone Encounter (Signed)
Spoke with pt husband, he called to report earlier today when the patient went to the bathroom she had blood on her pad. There was also blood in the toilet, but not a lot by the pateitnts report. She has a hx of hemorrhoids and feels the blood is related to that. The bleeding has now stopped. They will cont to watch and call back if continues or worsens

## 2014-03-31 ENCOUNTER — Other Ambulatory Visit: Payer: Self-pay | Admitting: Cardiology

## 2014-03-31 LAB — SEDIMENTATION RATE: SED RATE: 11 mm/h (ref 0–30)

## 2014-04-02 ENCOUNTER — Telehealth: Payer: Self-pay | Admitting: Cardiology

## 2014-04-02 DIAGNOSIS — Z79899 Other long term (current) drug therapy: Secondary | ICD-10-CM

## 2014-04-02 LAB — ANA: Anti Nuclear Antibody(ANA): NEGATIVE

## 2014-04-02 NOTE — Telephone Encounter (Signed)
Pt would like her lab results from 03-30-14 please.

## 2014-04-02 NOTE — Telephone Encounter (Signed)
Routed to Dr. Stanford Breed to review labs and advise

## 2014-04-03 MED ORDER — FUROSEMIDE 20 MG PO TABS: 20.0000 mg | ORAL_TABLET | Freq: Every day | ORAL | Status: DC

## 2014-04-03 NOTE — Telephone Encounter (Signed)
Calling about he Eliquis , Please call    Thanks

## 2014-04-03 NOTE — Telephone Encounter (Signed)
Follow up ° ° ° ° ° °Returning Paula's call °

## 2014-04-03 NOTE — Telephone Encounter (Signed)
Pt calling back wanting to know if have heard from Dr. Stanford Breed.  Advised that I spoke w/ our pharmacist, Ronnald Collum who states she didn't feel like Eliquis was causing the swelling.  Pt states she took last pill of Eliquis this AM and didn't want to pay for refill if Dr. Stanford Breed was going to change the medication.  She doesn't have any medication to take tonight or in AM.  Advised the previous note had been forwarded to Dr. Stanford Breed but he has been in a meeting all day and should respond today.  Advised will send this message to him also and someone should call her tomorrow.

## 2014-04-03 NOTE — Telephone Encounter (Signed)
Provided patient with lab results and physician comment regarding labs. Patient has immediate concern regarding Eliquis. She has been on this product for 2 weeks. She continues to experience swelling to feet/legs to the point that she can not wear but one specific pair of shoes. She states she has not weighed but feels like the swelling is localized. Denies cough/SOB. Patient also has redness to her abdomen, legs and feet that looks like a sunburn but is not hot to touch but is itchy. Patient has not taken any OTC medications. Her concern is that she is having a reaction to Eliquis. Please advise.

## 2014-04-03 NOTE — Telephone Encounter (Signed)
Pt calling back again about her lab results.

## 2014-04-03 NOTE — Telephone Encounter (Signed)
Discussed w/ patient. Furosemide sent to pharmacy. BMET ordered for [facility] in 1 week.

## 2014-04-03 NOTE — Telephone Encounter (Signed)
Change lasix to 20 mg daily; bmet one week Continue eliquis Kirk [facility]s

## 2014-04-10 ENCOUNTER — Ambulatory Visit: Payer: Commercial Managed Care - HMO | Admitting: Cardiology

## 2014-04-13 ENCOUNTER — Telehealth: Payer: Self-pay | Admitting: Cardiology

## 2014-04-13 ENCOUNTER — Other Ambulatory Visit: Payer: Self-pay | Admitting: *Deleted

## 2014-04-13 DIAGNOSIS — Z79899 Other long term (current) drug therapy: Secondary | ICD-10-CM

## 2014-04-13 LAB — BASIC METABOLIC PANEL
BUN: 34 mg/dL — AB (ref 6–23)
CALCIUM: 9.9 mg/dL (ref 8.4–10.5)
CHLORIDE: 107 meq/L (ref 96–112)
CO2: 23 mEq/L (ref 19–32)
CREATININE: 1.08 mg/dL (ref 0.50–1.10)
Glucose, Bld: 100 mg/dL — ABNORMAL HIGH (ref 70–99)
Potassium: 4.3 mEq/L (ref 3.5–5.3)
Sodium: 143 mEq/L (ref 135–145)

## 2014-04-13 NOTE — Telephone Encounter (Signed)
needed lab released BMP DONE

## 2014-05-08 NOTE — Progress Notes (Signed)
      HPI: FU syncope. Patient with history of syncope felt secondary to autonomic dysfunction. Previously loop monitor considered; Dr Lovena Le then felt it would be indicated if recurrent syncope. Also with PAD followed by vascular surgery. Recently admitted with syncope. Echo 2/16 showed normal LV function; trace AI; mild MR; mild LAE; severe right atrial and right ventricular enlargement, severe tricuspid regurgitation and moderately elevated pulmonary pressure. CTA showed pulmonary embolus. Since last seen, no dyspnea, chest pain or syncope. Pedal edema is improving.  Current Outpatient Prescriptions  Medication Sig Dispense Refill  . alendronate (FOSAMAX) 70 MG tablet TAKE 1 TABLET BY MOUTH EVERY 7 DAYS WITH A FULL GLASS OF WATER ON AN EMPTY STOMACH (Patient taking differently: Take 70 mg by mouth every Wednesday. TAKE 1 TABLET BY MOUTH EVERY 7 DAYS WITH A FULL GLASS OF WATER ON AN EMPTY STOMACH) 12 tablet 3  . atorvastatin (LIPITOR) 20 MG tablet TAKE 1 TABLET (20 MG TOTAL) BY MOUTH DAILY AT 6 PM. 30 tablet 5  . diltiazem (CARDIZEM CD) 120 MG 24 hr capsule TAKE 1 CAPSULE (120 MG TOTAL) BY MOUTH DAILY. 30 capsule 5  . ELIQUIS 5 MG TABS tablet TAKE 1 TABLET (5 MG TOTAL) BY MOUTH 2 (TWO) TIMES DAILY. *STARTING 03/05/14* 60 tablet 5  . furosemide (LASIX) 20 MG tablet Take 1 tablet (20 mg total) by mouth daily. 90 tablet 1   No current facility-administered medications for this visit.     Past Medical History  Diagnosis Date  . BREAST BIOPSY, HX OF 06/10/2006  . HIP PAIN, LEFT 01/31/2007  . HYPERLIPIDEMIA 06/10/2006  . HYPERTENSION 06/10/2006  . NEOPLASMS UNSPEC NATURE BONE SOFT TISSUE&SKIN 05/31/2008  . OSTEOPOROSIS 06/10/2006  . Impaired glucose tolerance   . Breast cyst   . Pulmonary embolus     Past Surgical History  Procedure Laterality Date  . Appendectomy    . Abdominal hysterectomy    . Breast surgery      bx x2  . Cholecystectomy  1980    History   Social History  . Marital  Status: Married    Spouse Name: N/A  . Number of Children: 4  . Years of Education: N/A   Occupational History  . Not on file.   Social History Main Topics  . Smoking status: Never Smoker   . Smokeless tobacco: Current User    Types: Snuff  . Alcohol Use: No  . Drug Use: No  . Sexual Activity: Yes   Other Topics Concern  . Not on file   Social History Narrative    ROS: no fevers or chills, productive cough, hemoptysis, dysphasia, odynophagia, melena, hematochezia, dysuria, hematuria, rash, seizure activity, orthopnea, PND, claudication. Remaining systems are negative.  Physical Exam: Well-developed well-nourished in no acute distress.  Skin is warm and dry.  HEENT is normal.  Neck is supple.  Chest is clear to auscultation with normal expansion.  Cardiovascular exam is regular rate and rhythm.  Abdominal exam nontender or distended. No masses palpated. Extremities show trace edema. Varicosities noted neuro grossly intact

## 2014-05-11 ENCOUNTER — Encounter: Payer: Self-pay | Admitting: Cardiology

## 2014-05-11 ENCOUNTER — Ambulatory Visit (INDEPENDENT_AMBULATORY_CARE_PROVIDER_SITE_OTHER): Payer: Commercial Managed Care - HMO | Admitting: Cardiology

## 2014-05-11 VITALS — BP 132/60 | HR 72 | Ht 67.0 in | Wt 181.7 lb

## 2014-05-11 DIAGNOSIS — I2699 Other pulmonary embolism without acute cor pulmonale: Secondary | ICD-10-CM

## 2014-05-11 DIAGNOSIS — I270 Primary pulmonary hypertension: Secondary | ICD-10-CM

## 2014-05-11 DIAGNOSIS — I272 Pulmonary hypertension, unspecified: Secondary | ICD-10-CM

## 2014-05-11 DIAGNOSIS — I739 Peripheral vascular disease, unspecified: Secondary | ICD-10-CM

## 2014-05-11 DIAGNOSIS — I10 Essential (primary) hypertension: Secondary | ICD-10-CM

## 2014-05-11 DIAGNOSIS — E785 Hyperlipidemia, unspecified: Secondary | ICD-10-CM | POA: Diagnosis not present

## 2014-05-11 DIAGNOSIS — R55 Syncope and collapse: Secondary | ICD-10-CM

## 2014-05-11 NOTE — Assessment & Plan Note (Signed)
Continue apixaban 

## 2014-05-11 NOTE — Assessment & Plan Note (Signed)
No recurrent episodes. I have asked her not to drive 6 months from the last event.

## 2014-05-11 NOTE — Patient Instructions (Signed)
Your physician wants you to follow-up in: 6 MONTHS WITH DR CRENSHAW You will receive a reminder letter in the mail two months in advance. If you don't receive a letter, please call our office to schedule the follow-up appointment.  

## 2014-05-11 NOTE — Assessment & Plan Note (Signed)
Continue statin.followed by vascular surgery. 

## 2014-05-11 NOTE — Assessment & Plan Note (Signed)
Blood pressure controlled. Continue present medications. 

## 2014-05-11 NOTE — Assessment & Plan Note (Signed)
Continue statin. 

## 2014-05-11 NOTE — Assessment & Plan Note (Addendum)
Pulmonary hypertension and tricuspid regurgitation most likely secondary to previous pulmonary embolus. Will repeat study in the future. Previous rheumatologic labs normal.

## 2014-07-04 ENCOUNTER — Encounter: Payer: Self-pay | Admitting: Internal Medicine

## 2014-07-04 ENCOUNTER — Ambulatory Visit (INDEPENDENT_AMBULATORY_CARE_PROVIDER_SITE_OTHER): Payer: Commercial Managed Care - HMO | Admitting: Internal Medicine

## 2014-07-04 VITALS — BP 140/80 | HR 71 | Temp 98.2°F | Resp 20 | Ht 67.0 in | Wt 180.0 lb

## 2014-07-04 DIAGNOSIS — I480 Paroxysmal atrial fibrillation: Secondary | ICD-10-CM

## 2014-07-04 DIAGNOSIS — Z0000 Encounter for general adult medical examination without abnormal findings: Secondary | ICD-10-CM

## 2014-07-04 DIAGNOSIS — I272 Pulmonary hypertension, unspecified: Secondary | ICD-10-CM

## 2014-07-04 DIAGNOSIS — I10 Essential (primary) hypertension: Secondary | ICD-10-CM

## 2014-07-04 DIAGNOSIS — I739 Peripheral vascular disease, unspecified: Secondary | ICD-10-CM

## 2014-07-04 DIAGNOSIS — I071 Rheumatic tricuspid insufficiency: Secondary | ICD-10-CM

## 2014-07-04 DIAGNOSIS — E785 Hyperlipidemia, unspecified: Secondary | ICD-10-CM

## 2014-07-04 DIAGNOSIS — I4819 Other persistent atrial fibrillation: Secondary | ICD-10-CM

## 2014-07-04 NOTE — Patient Instructions (Signed)
Limit your sodium (Salt) intake    It is important that you exercise regularly, at least 20 minutes 3 to 4 times per week.  If you develop chest pain or shortness of breath seek  medical attention.  Take a calcium supplement, plus [phone] units of vitamin D  Preventive services should include an annual health examination with screening lab.  Annual eye examination recommended.  Cardiology follow-up in vascular surgery follow-up.  Also encouraged   Return in 6 months for follow-up

## 2014-07-04 NOTE — Progress Notes (Signed)
Subjective:    Patient ID: Tammy Tammy [name], female    DOB: 1931/02/13, 81 y.o.   MRN: [id number]  HPI 81 -year-old patient, lifelong nonsmoker, who has a history of hypertension, and dyslipidemia.  She is seen today for a preventive health examination In February 2016, she was admitted for acute pulmonary embolism associated with syncope  Medical problems include peripheral vascular disease, osteoarthritis, and osteoporosis.  She is now being followed by vascular surgery.  Her left leg pain has improved.   Medicare wellness exam  1. Risk factors, based on past M,S,F history- cardiovascular risk factors include Tammy [name] hypertension and dyslipidemia .  She has known PAD 2. Physical activities: Fairly active and independent but no rigorous exercise program .  History of left leg claudication 3. Depression/mood: No history depression or mood disorder  4. Hearing: No major deficits  5. ADL's: Remains independent in all aspects of daily living  6. Fall risk: Moderate due to Tammy [name] 81. Home safety: No problems identified  8. Height weight, and visual acuity; height and weight fairly stable. There has been a voluntary 8 pound weight loss no change in visual acuity  9. Counseling: Heart healthy diet more regular exercise are encouraged  10. Lab orders based on risk factors: Laboratory profile including lipid panel will be reviewed  11. Referral : Not appropriate at this time  12. Care plan: Low-salt heart healthy diet encouraged we'll continue calcium and vitamin D supplements as well as low-dose aspirin  13. Cognitive assessment: Alert and oriented with normal affect. No cognitive dysfunction  14.  Patient was provided with a written and personalized care plan.  Preventive services Should include an annual health examination with screening lab.  Annual eye examination recommended.  Cardiology follow-up in vascular surgery follow-up.  Also encouraged  15.  Provider list includes primary care cardiology,  ophthalmology and vascular surgery  Preventive Screening-Counseling & Management  Alcohol-Tobacco  Smoking Status: never   Allergies (verified):  No Known Drug Allergies   Past History:  Past Medical History:   Hyperlipidemia  Hypertension  Osteoporosis  Recurrent syncope  PAD  acute pulmonary embolism February 2016    Past Surgical History:   Appendectomy  Cholecystectomy  Hysterectomy  gravida 4, para 4, abortus zero  breast biopsies x 2 in 1985, 1998  colonoscopy- none (declines)   Family History:   father died at Tammy [name] 36, history of asthma, CAD  mother died Tammy [name] 24 of CAD  One brother died of accidental death  two half sisters, positive CAD  one sister is well   Social History:  Reviewed history from 01/19/2007 and no changes required.  9 grandchildren and 9 great grandchildren      Past Medical History  Diagnosis Date  . BREAST BIOPSY, HX OF 06/10/2006  . HIP PAIN, LEFT 01/31/2007  . HYPERLIPIDEMIA 06/10/2006  . HYPERTENSION 06/10/2006  . NEOPLASMS UNSPEC NATURE BONE SOFT TISSUE&SKIN 05/31/2008  . OSTEOPOROSIS 06/10/2006  . Impaired glucose tolerance   . Breast cyst   . Pulmonary embolus     History   Social History  . Marital Status: Married    Spouse Name: N/A  . Number of Children: 4  . Years of Education: N/A   Occupational History  . Not on file.   Social History Main Topics  . Smoking status: Never Smoker   . Smokeless tobacco: Current User    Types: Snuff  . Alcohol Use: No  . Drug Use: No  . Sexual Activity:  Yes   Other Topics Concern  . Not on file   Social History Narrative    Past Surgical History  Procedure Laterality Date  . Appendectomy    . Abdominal hysterectomy    . Breast surgery      bx x2  . Cholecystectomy  1980    Family History  Problem Relation Tammy [name] of Onset  . Heart disease Mother     Pacemaker  . Heart disease Father   . Asthma Father   . Diabetes Father   . Cancer Maternal Aunt     breast  .  Cancer Sister   . Heart disease Sister   . Hyperlipidemia Sister   . Hypertension Sister     No Known Allergies  Current Outpatient Prescriptions on File Prior to Visit  Medication Sig Dispense Refill  . alendronate (FOSAMAX) 70 MG tablet TAKE 1 TABLET BY MOUTH EVERY 7 DAYS WITH A FULL GLASS OF WATER ON AN EMPTY STOMACH (Patient taking differently: Take 70 mg by mouth every Wednesday. TAKE 1 TABLET BY MOUTH EVERY 7 DAYS WITH A FULL GLASS OF WATER ON AN EMPTY STOMACH) 12 tablet 3  . atorvastatin (LIPITOR) 20 MG tablet TAKE 1 TABLET (20 MG TOTAL) BY MOUTH DAILY AT 6 PM. 30 tablet 5  . diltiazem (CARDIZEM CD) 120 MG 24 hr capsule TAKE 1 CAPSULE (120 MG TOTAL) BY MOUTH DAILY. 30 capsule 5  . ELIQUIS 5 MG TABS tablet TAKE 1 TABLET (5 MG TOTAL) BY MOUTH 2 (TWO) TIMES DAILY. *STARTING 03/05/14* 60 tablet 5  . furosemide (LASIX) 20 MG tablet Take 1 tablet (20 mg total) by mouth daily. 90 tablet 1   No current facility-administered medications on file prior to visit.    BP 140/80 mmHg  Pulse 71  Temp(Src) 98.2 F (36.8 C) (Oral)  Resp 20  Ht 5\' 7"  (1.702 m)  Wt 180 lb (81.647 kg)  BMI 28.19 kg/m2  SpO2 98%       Review of Systems  Constitutional: Negative.   HENT: Negative for congestion, dental problem, hearing loss, rhinorrhea, sinus pressure, sore throat and tinnitus.   Eyes: Negative for pain, discharge and visual disturbance.  Respiratory: Negative for cough and shortness of breath.   Cardiovascular: Negative for chest pain, palpitations and leg swelling.       L Leg claudication improved  Gastrointestinal: Negative for nausea, vomiting, abdominal pain, diarrhea, constipation, blood in stool and abdominal distention.  Genitourinary: Negative for dysuria, urgency, frequency, hematuria, flank pain, vaginal bleeding, vaginal discharge, difficulty urinating, vaginal pain and pelvic pain.  Musculoskeletal: Negative for joint swelling, arthralgias and gait problem.  Skin: Negative for  rash.  Neurological: Negative for dizziness, syncope, speech difficulty, weakness, numbness and headaches.  Hematological: Negative for adenopathy.  Psychiatric/Behavioral: Negative for behavioral problems, dysphoric mood and agitation. The patient is not nervous/anxious.        Objective:   Physical Exam  Constitutional: She is oriented to person, place, and time. She appears well-developed and well-nourished. No distress.  HENT:  Head: Normocephalic and atraumatic.  Right Ear: External ear normal.  Left Ear: External ear normal.  Mouth/Throat: Oropharynx is clear and moist.  Eyes: Conjunctivae and EOM are normal.  Neck: Normal range of motion. Neck supple. No JVD present. No thyromegaly present.  Cardiovascular: Normal rate, normal heart sounds and intact distal pulses.   No murmur heard. pedal pulses were nonpalpable  Irregular heart rate with a controlled ventricular response  Pulmonary/Chest: Effort normal and breath sounds  normal. She has no wheezes. She has no rales.  Kyphosis  Abdominal: Soft. Bowel sounds are normal. She exhibits no distension and no mass. There is no tenderness. There is no rebound and no guarding.  Right lower quadrant scar  Genitourinary: Vagina normal.  Musculoskeletal: Normal range of motion. She exhibits edema. She exhibits no tenderness.  Both feet were cool to touch and slightly cyanotic No ulceration. Mild edema more prominent left ankle  Neurological: She is alert and oriented to person, place, and time. She has normal reflexes. No cranial nerve deficit. She exhibits normal muscle tone. Coordination normal.  Skin: Skin is warm and dry. No rash noted.  Feet cool and sl cyanotic  Psychiatric: She has a normal mood and affect. Her behavior is normal.          Assessment & Plan:  PAD Hypertension stable Dyslipidemia Prev Health exam Osteoporosis.  Patient has been on Fosamax greater than 5 years.  Will discontinue after present supply and  continue on calcium and vitamin D supplements  Meds updated f/u Vascular  ROV 6 months

## 2014-07-04 NOTE — Progress Notes (Signed)
Pre visit review using our clinic review tool, if applicable. No additional management support is needed unless otherwise documented below in the visit note. 

## 2014-09-03 ENCOUNTER — Other Ambulatory Visit: Payer: Self-pay | Admitting: Internal Medicine

## 2014-09-20 ENCOUNTER — Other Ambulatory Visit: Payer: Self-pay | Admitting: Cardiology

## 2014-09-27 ENCOUNTER — Other Ambulatory Visit: Payer: Self-pay | Admitting: Cardiology

## 2014-10-29 NOTE — Progress Notes (Signed)
      HPI: FU syncope. Patient with history of syncope felt secondary to autonomic dysfunction. Previously loop monitor considered; Dr Lovena Le then felt it would be indicated if recurrent syncope. Also with PAD followed by vascular surgery. Echo 2/16 showed normal LV function; trace AI; mild MR; mild LAE; severe right atrial and right ventricular enlargement, severe tricuspid regurgitation and moderately elevated pulmonary pressure. CTA showed pulmonary embolus. Since last seen, She has some dyspnea on exertion but no orthopnea or PND. Minimal pedal edema. No chest pain or syncope.  Current Outpatient Prescriptions  Medication Sig Dispense Refill  . apixaban (ELIQUIS) 5 MG TABS tablet Take 1 tablet (5 mg total) by mouth 2 (two) times daily. 60 tablet 5  . atorvastatin (LIPITOR) 20 MG tablet TAKE 1 TABLET (20 MG TOTAL) BY MOUTH DAILY AT 6 PM. 30 tablet 5  . diltiazem (CARDIZEM CD) 120 MG 24 hr capsule TAKE 1 CAPSULE (120 MG TOTAL) BY MOUTH DAILY. 30 capsule 5  . furosemide (LASIX) 20 MG tablet TAKE 1 TABLET (20 MG TOTAL) BY MOUTH DAILY. 90 tablet 0   No current facility-administered medications for this visit.     Past Medical History  Diagnosis Date  . BREAST BIOPSY, HX OF 06/10/2006  . HIP PAIN, LEFT 01/31/2007  . HYPERLIPIDEMIA 06/10/2006  . HYPERTENSION 06/10/2006  . NEOPLASMS UNSPEC NATURE BONE SOFT TISSUE&SKIN 05/31/2008  . OSTEOPOROSIS 06/10/2006  . Impaired glucose tolerance   . Breast cyst   . Pulmonary embolus [facility])     Past Surgical History  Procedure Laterality Date  . Appendectomy    . Abdominal hysterectomy    . Breast surgery      bx x2  . Cholecystectomy  1980    Social History   Social History  . Marital Status: Married    Spouse Name: N/A  . Number of Children: 4  . Years of Education: N/A   Occupational History  . Not on file.   Social History Main Topics  . Smoking status: Never Smoker   . Smokeless tobacco: Current User    Types: Snuff  . Alcohol Use:  No  . Drug Use: No  . Sexual Activity: Yes   Other Topics Concern  . Not on file   Social History Narrative    ROS: no fevers or chills, productive cough, hemoptysis, dysphasia, odynophagia, melena, hematochezia, dysuria, hematuria, rash, seizure activity, orthopnea, PND, pedal edema, claudication. Remaining systems are negative.  Physical Exam: Well-developed well-nourished in no acute distress.  Skin is warm and dry.  HEENT is normal.  Neck is supple.  Chest is clear to auscultation with normal expansion.  Cardiovascular exam is regular rate and rhythm.  Abdominal exam nontender or distended. No masses palpated. Extremities show Chronic skin changes and trace edema. neuro grossly intact  ECG Sinus rhythm with first-degree AV block and PVCs. Right bundle branch block. Septal infarct.

## 2014-11-05 ENCOUNTER — Encounter: Payer: Self-pay | Admitting: *Deleted

## 2014-11-05 ENCOUNTER — Ambulatory Visit (INDEPENDENT_AMBULATORY_CARE_PROVIDER_SITE_OTHER): Payer: Commercial Managed Care - HMO | Admitting: Cardiology

## 2014-11-05 ENCOUNTER — Encounter: Payer: Self-pay | Admitting: Cardiology

## 2014-11-05 VITALS — BP 144/78 | HR 70 | Ht 67.0 in | Wt 180.0 lb

## 2014-11-05 DIAGNOSIS — I2699 Other pulmonary embolism without acute cor pulmonale: Secondary | ICD-10-CM

## 2014-11-05 DIAGNOSIS — I10 Essential (primary) hypertension: Secondary | ICD-10-CM | POA: Diagnosis not present

## 2014-11-05 DIAGNOSIS — I079 Rheumatic tricuspid valve disease, unspecified: Secondary | ICD-10-CM | POA: Diagnosis not present

## 2014-11-05 LAB — BASIC METABOLIC PANEL
BUN: 32 mg/dL — ABNORMAL HIGH (ref 7–25)
CALCIUM: 9.7 mg/dL (ref 8.6–10.4)
CO2: 25 mmol/L (ref 20–31)
Chloride: 106 mmol/L (ref 98–110)
Creat: 1.1 mg/dL — ABNORMAL HIGH (ref 0.60–0.88)
GLUCOSE: 120 mg/dL — AB (ref 65–99)
Potassium: 4.7 mmol/L (ref 3.5–5.3)
SODIUM: 143 mmol/L (ref 135–146)

## 2014-11-05 LAB — CBC
HCT: 40.9 % (ref 36.0–46.0)
HEMOGLOBIN: 13.5 g/dL (ref 12.0–15.0)
MCH: 30.4 pg (ref 26.0–34.0)
MCHC: 33 g/dL (ref 30.0–36.0)
MCV: 92.1 fL (ref 78.0–100.0)
MPV: 9.9 fL (ref 8.6–12.4)
Platelets: 283 10*3/uL (ref 150–400)
RBC: 4.44 MIL/uL (ref 3.87–5.11)
RDW: 14 % (ref 11.5–15.5)
WBC: 7 10*3/uL (ref 4.0–10.5)

## 2014-11-05 NOTE — Assessment & Plan Note (Signed)
Continue statin. 

## 2014-11-05 NOTE — Assessment & Plan Note (Signed)
Blood pressure controlled. Continue present medications. 

## 2014-11-05 NOTE — Patient Instructions (Signed)
Medication Instructions:   NO CHANGE  Labwork:  Your physician recommends that you return HAVE LAB WORK TODAY  Testing/Procedures:  Your physician has requested that you have an echocardiogram. Echocardiography is a painless test that uses sound waves to create images of your heart. It provides your doctor with information about the size and shape of your heart and how well your heart's chambers and valves are working. This procedure takes approximately one hour. There are no restrictions for this procedure.    Follow-Up:  Your physician wants you to follow-up in: [place] will receive a reminder letter in the mail two months in advance. If you don't receive a letter, please call our office to schedule the follow-up appointment.   Any Other Special Instructions Will Be Listed Below (If Applicable).  REFERRAL TO PULMONARY FOR HX OF PULMONARY EMBOLUS

## 2014-11-05 NOTE — Assessment & Plan Note (Signed)
No recurrent episodes. 

## 2014-11-05 NOTE — Assessment & Plan Note (Signed)
Followed by vascular surgery. 

## 2014-11-05 NOTE — Assessment & Plan Note (Signed)
Repeat echocardiogram. 

## 2014-11-05 NOTE — Assessment & Plan Note (Addendum)
Patient continues on apixaban.It is not clear to me that she had preceding risk factors such as surgery or traveling prior to her pulmonary embolus. She was not given instructions on how long to continue her anticoagulation. I will ask pulmonary to evaluate for an opinion concerning duration. Check hemoglobin and renal function.

## 2014-11-13 ENCOUNTER — Other Ambulatory Visit: Payer: Self-pay

## 2014-11-13 ENCOUNTER — Ambulatory Visit (HOSPITAL_COMMUNITY): Payer: Commercial Managed Care - HMO | Attending: Internal Medicine

## 2014-11-13 DIAGNOSIS — I10 Essential (primary) hypertension: Secondary | ICD-10-CM | POA: Diagnosis not present

## 2014-11-13 DIAGNOSIS — I517 Cardiomegaly: Secondary | ICD-10-CM | POA: Diagnosis not present

## 2014-11-13 DIAGNOSIS — I079 Rheumatic tricuspid valve disease, unspecified: Secondary | ICD-10-CM

## 2014-11-13 DIAGNOSIS — I351 Nonrheumatic aortic (valve) insufficiency: Secondary | ICD-10-CM | POA: Insufficient documentation

## 2014-11-13 DIAGNOSIS — E785 Hyperlipidemia, unspecified: Secondary | ICD-10-CM | POA: Insufficient documentation

## 2014-11-13 DIAGNOSIS — I358 Other nonrheumatic aortic valve disorders: Secondary | ICD-10-CM | POA: Diagnosis not present

## 2014-11-13 DIAGNOSIS — I071 Rheumatic tricuspid insufficiency: Secondary | ICD-10-CM | POA: Insufficient documentation

## 2014-11-15 ENCOUNTER — Encounter: Payer: Self-pay | Admitting: Pulmonary Disease

## 2014-11-15 ENCOUNTER — Ambulatory Visit (INDEPENDENT_AMBULATORY_CARE_PROVIDER_SITE_OTHER): Payer: Commercial Managed Care - HMO | Admitting: Pulmonary Disease

## 2014-11-15 VITALS — BP 134/66 | HR 73 | Temp 98.6°F | Ht 67.0 in

## 2014-11-15 DIAGNOSIS — I2609 Other pulmonary embolism with acute cor pulmonale: Secondary | ICD-10-CM | POA: Diagnosis not present

## 2014-11-15 DIAGNOSIS — I2782 Chronic pulmonary embolism: Secondary | ICD-10-CM | POA: Diagnosis not present

## 2014-11-15 NOTE — Patient Instructions (Signed)
Please continue the Eliquis indefinitely.

## 2014-11-15 NOTE — Progress Notes (Signed)
Subjective:    Patient ID: Tammy [name], female    DOB: Apr 02, 1931, 81 y.o.   MRN: [id number]  HPI Consult for opinion on duration of anticoagulation for PE  Tammy [name] is an 81 year old with past medical history of hypertension, hyperlipidemia. She's had recurrent syncope episodes over the past 2-3 years. She was hospitalized in January 2016 with syncope, A. fib with RVR. She was found to have Lt DVT and pulmonary embolism. The PE appears to be unprovoked. She does not have any risk factors such as surgery, travel, immobility. There is no family history of clots. I do not see a hypercoagulable workup during this admission. An Echo showed dilated RV with elevated PA pressures. She was started on Eliquis for anticoagulation.  After her discharge she has done well with no more episodes of syncope. She has some dyspnea on exertion. She complains of fatigue. She does not have any cough, chills, fever, sputum production. No chest pain or palpitations.   Past Medical History  Diagnosis Date  . BREAST BIOPSY, HX OF 06/10/2006  . HIP PAIN, LEFT 01/31/2007  . HYPERLIPIDEMIA 06/10/2006  . HYPERTENSION 06/10/2006  . NEOPLASMS UNSPEC NATURE BONE SOFT TISSUE&SKIN 05/31/2008  . OSTEOPOROSIS 06/10/2006  . Impaired glucose tolerance   . Breast cyst   . Pulmonary embolus ([place])     Current outpatient prescriptions:  .  apixaban (ELIQUIS) 5 MG TABS tablet, Take 1 tablet (5 mg total) by mouth 2 (two) times daily., Disp: 60 tablet, Rfl: 5 .  atorvastatin (LIPITOR) 20 MG tablet, TAKE 1 TABLET (20 MG TOTAL) BY MOUTH DAILY AT 6 PM., Disp: 30 tablet, Rfl: 5 .  diltiazem (CARDIZEM CD) 120 MG 24 hr capsule, TAKE 1 CAPSULE (120 MG TOTAL) BY MOUTH DAILY., Disp: 30 capsule, Rfl: 5 .  furosemide (LASIX) 20 MG tablet, TAKE 1 TABLET (20 MG TOTAL) BY MOUTH DAILY., Disp: 90 tablet, Rfl: 0  Data: Echo (11/14/14) - Left ventricle: The cavity size was normal. Wall thickness was increased in a pattern of mild LVH. Systolic  function was normal. The estimated ejection fraction was in the range of 55% to 60%. Wall motion was normal; there were no regional wall motion abnormalities. The study is not technically sufficient to allow evaluation of LV diastolic function. - Aortic valve: Sclerosis without stenosis. There was mild regurgitation. - Left atrium: Moderately dilated at 41 ml/m2. - Right ventricle: The cavity size was mildly dilated. Wall thickness was normal. Systolic function was normal. - Right atrium: Moderately dilated at 24 cm2. - Atrial septum: No defect or patent foramen ovale was identified. - Tricuspid valve: There was moderate to more likely severe regurgitation. - Pulmonary arteries: PA peak pressure: 53 mm Hg (S).  CT chest (01/28/14) Study is positive for pulmonary embolus with about the seen in the distal right main pulmonary artery and descending right intralobar artery. No evidence of right heart strain is present.  Right lower lobe airspace disease most consistent with pulmonary Infarct.  LE dopplers (02/26/14) - Findings consistent with deep vein thrombosis involving the left femoral vein, left popliteal vein, and left posterial tibial vein. - Findings consistent with superficial vein thrombosis involving the right greater saphenous vein.   Review of Systems Complains of dyspnea on exertion, fatigue. No cough, sputum production, hemoptysis No chest pain, palpitations.     Objective:   Physical Exam Blood pressure 134/66, pulse 73, temperature 98.6 F (37 C), temperature source Oral, height 5\' 7"  (1.702 m), SpO2 96 %.  Gen.:  Pleasant elderly female. No apparent distress Neuro: No gross focal deficits. Neck: No JVD, lymphadenopathy, thyromegaly. RS: Clear. No wheeze or crackles. CVS: S1-S2 heard, no murmurs rubs gallops. Abdomen: Soft, positive bowel sounds. Extremities: 1+ Edema over the lt lower extremity.     Assessment & Plan:   Lower extremity  DVT, PE  The PE is apparently unprovoked. The current recommendations for duration of anticoagulation for unprovoked venous thromboembolisms, PEs are to anticoagulate indefinitely if there are no risk factors for bleeding. She does have right heart failure and pulmonary hypertension, cor pulmonale which is likely from her pulmonary embolism. Interestingly these findings were present as far back as 2014. 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 Her last echo on 11/14/14 after 10 months of anticoagulation shows an improvement in RV size and stable PA pressures.  I recommend that she be continued on anticoagulation indefinitely.  Marshell Garfinkel MD [facility] Pager [phone] If no answer or after 3pm call: [phone] 11/15/2014, 4:56 PM

## 2014-11-20 DIAGNOSIS — Z1231 Encounter for screening mammogram for malignant neoplasm of breast: Secondary | ICD-10-CM | POA: Diagnosis not present

## 2014-11-20 DIAGNOSIS — Z803 Family history of malignant neoplasm of breast: Secondary | ICD-10-CM | POA: Diagnosis not present

## 2014-11-20 LAB — HM MAMMOGRAPHY: HM Mammogram: NEGATIVE

## 2014-11-22 ENCOUNTER — Encounter: Payer: Self-pay | Admitting: Internal Medicine

## 2015-01-02 ENCOUNTER — Ambulatory Visit (INDEPENDENT_AMBULATORY_CARE_PROVIDER_SITE_OTHER): Payer: Commercial Managed Care - HMO | Admitting: Internal Medicine

## 2015-01-02 ENCOUNTER — Encounter: Payer: Self-pay | Admitting: Internal Medicine

## 2015-01-02 VITALS — BP 136/80 | HR 92 | Temp 98.8°F | Resp 20 | Ht 67.0 in | Wt 185.0 lb

## 2015-01-02 DIAGNOSIS — I10 Essential (primary) hypertension: Secondary | ICD-10-CM | POA: Diagnosis not present

## 2015-01-02 DIAGNOSIS — M25552 Pain in left hip: Secondary | ICD-10-CM | POA: Diagnosis not present

## 2015-01-02 DIAGNOSIS — E785 Hyperlipidemia, unspecified: Secondary | ICD-10-CM

## 2015-01-02 DIAGNOSIS — I480 Paroxysmal atrial fibrillation: Secondary | ICD-10-CM | POA: Diagnosis not present

## 2015-01-02 DIAGNOSIS — R300 Dysuria: Secondary | ICD-10-CM | POA: Diagnosis not present

## 2015-01-02 LAB — POCT URINALYSIS DIPSTICK
Bilirubin, UA: NEGATIVE
GLUCOSE UA: NEGATIVE
Ketones, UA: NEGATIVE
NITRITE UA: NEGATIVE
PH UA: 5
Protein, UA: NEGATIVE
Spec Grav, UA: 1.01
UROBILINOGEN UA: 0.2

## 2015-01-02 MED ORDER — AMOXICILLIN 500 MG PO CAPS: 500.0000 mg | ORAL_CAPSULE | Freq: Three times a day (TID) | ORAL | Status: DC

## 2015-01-02 NOTE — Progress Notes (Signed)
Pre visit review using our clinic review tool, if applicable. No additional management support is needed unless otherwise documented below in the visit note. 

## 2015-01-02 NOTE — Progress Notes (Signed)
   Subjective:    Patient ID: Tammy [name], female    DOB: January 03, 1932, 81 y.o.   MRN: [id number]  HPI    Review of Systems     Objective:   Physical Exam        Assessment & Plan:  Probably not this is a test he has this situation andis no real frequent

## 2015-01-02 NOTE — Progress Notes (Signed)
Subjective:    Patient ID: Tammy [name], female    DOB: 13-Jul-1931, 81 y.o.   MRN: [id number]  HPI  81 year old patient who is seen today for her six-month follow-up.  She has a history of hypertension and dyslipidemia.  She is followed by cardiology for chronic atrial fibrillation and is also been seen by pulmonary medicine.  She is doing quite well.  Complaints include dysuria of 2 weeks' duration. For the past 2 months.  She is also had some left hip pain.  This has been a problem in the past. She has a history of PAD.  Past Medical History  Diagnosis Date  . BREAST BIOPSY, HX OF 06/10/2006  . HIP PAIN, LEFT 01/31/2007  . HYPERLIPIDEMIA 06/10/2006  . HYPERTENSION 06/10/2006  . NEOPLASMS UNSPEC NATURE BONE SOFT TISSUE&SKIN 05/31/2008  . OSTEOPOROSIS 06/10/2006  . Impaired glucose tolerance   . Breast cyst   . Pulmonary embolus [facility])     Social History   Social History  . Marital Status: Married    Spouse Name: N/A  . Number of Children: 4  . Years of Education: N/A   Occupational History  . Not on file.   Social History Main Topics  . Smoking status: Never Smoker   . Smokeless tobacco: Current User    Types: Snuff  . Alcohol Use: No  . Drug Use: No  . Sexual Activity: Yes   Other Topics Concern  . Not on file   Social History Narrative    Past Surgical History  Procedure Laterality Date  . Appendectomy    . Abdominal hysterectomy    . Breast surgery      bx x2  . Cholecystectomy  1980    Family History  Problem Relation [name] of Onset  . Heart disease Mother     Pacemaker  . Heart disease Father   . Asthma Father   . Diabetes Father   . Cancer Maternal Aunt     breast  . Cancer Sister   . Heart disease Sister   . Hyperlipidemia Sister   . Hypertension Sister     No Known Allergies  Current Outpatient Prescriptions on File Prior to Visit  Medication Sig Dispense Refill  . apixaban (ELIQUIS) 5 MG TABS tablet Take 1 tablet (5 mg total) by mouth 2  (two) times daily. 60 tablet 5  . atorvastatin (LIPITOR) 20 MG tablet TAKE 1 TABLET (20 MG TOTAL) BY MOUTH DAILY AT 6 PM. 30 tablet 5  . diltiazem (CARDIZEM CD) 120 MG 24 hr capsule TAKE 1 CAPSULE (120 MG TOTAL) BY MOUTH DAILY. 30 capsule 5  . furosemide (LASIX) 20 MG tablet TAKE 1 TABLET (20 MG TOTAL) BY MOUTH DAILY. 90 tablet 0   No current facility-administered medications on file prior to visit.    BP 136/80 mmHg  Pulse 92  Temp(Src) 98.8 F (37.1 C) (Oral)  Resp 20  Ht 5\' 7"  (1.702 m)  Wt 185 lb (83.915 kg)  BMI 28.97 kg/m2  SpO2 98%     Review of Systems  Constitutional: Negative.   HENT: Negative for congestion, dental problem, hearing loss, rhinorrhea, sinus pressure, sore throat and tinnitus.   Eyes: Negative for pain, discharge and visual disturbance.  Respiratory: Negative for cough and shortness of breath.   Cardiovascular: Negative for chest pain, palpitations and leg swelling.  Gastrointestinal: Negative for nausea, vomiting, abdominal pain, diarrhea, constipation, blood in stool and abdominal distention.  Genitourinary: Positive for dysuria. Negative for urgency,  frequency, hematuria, flank pain, vaginal bleeding, vaginal discharge, difficulty urinating, vaginal pain and pelvic pain.  Musculoskeletal: Negative for joint swelling, arthralgias and gait problem.       Left hip pain  Skin: Negative for rash.  Neurological: Negative for dizziness, syncope, speech difficulty, weakness, numbness and headaches.  Hematological: Negative for adenopathy.  Psychiatric/Behavioral: Negative for behavioral problems, dysphoric mood and agitation. The patient is not nervous/anxious.        Objective:   Physical Exam  Constitutional: She is oriented to person, place, and time. She appears well-developed and well-nourished.  HENT:  Head: Normocephalic.  Right Ear: External ear normal.  Left Ear: External ear normal.  Mouth/Throat: Oropharynx is clear and moist.  Eyes:  Conjunctivae and EOM are normal. Pupils are equal, round, and reactive to light.  Neck: Normal range of motion. Neck supple. No thyromegaly present.  Cardiovascular: Normal rate, normal heart sounds and intact distal pulses.   Irregular rhythm with controlled ventricular response  Pulmonary/Chest: Effort normal and breath sounds normal.  Abdominal: Soft. Bowel sounds are normal. She exhibits no mass. There is no tenderness.  Very mild suprapubic tenderness  Musculoskeletal: Normal range of motion.  Slight discomfort with range of motion.  Left hip compared to the right  Lymphadenopathy:    She has no cervical adenopathy.  Neurological: She is alert and oriented to person, place, and time.  Skin: Skin is warm and dry. No rash noted.  Psychiatric: She has a normal mood and affect. Her behavior is normal.          Assessment & Plan:  Essential hypertension, stable Dyslipidemia.  Continue statin therapy Chronic atrial fibrillation.  Controlled ventricular response.  Continue anticoagulation Left hip pain.  Will try Tylenol when necessary.  Consider x-ray if worsens Dysuria.  Check UA.  Rule out UTI  CPX 6 months

## 2015-01-02 NOTE — Patient Instructions (Signed)
Take 650-100 0 mg of Tylenol every 6 hours as needed for pain relief or fever.  Avoid taking more than 3000 mg in a 24-hour period (  This may cause liver damage).  Return in 6 months for follow-up  Limit your sodium (Salt) intake    It is important that you exercise regularly, at least 20 minutes 3 to 4 times per week.  If you develop chest pain or shortness of breath seek  medical attention.

## 2015-01-03 ENCOUNTER — Ambulatory Visit: Payer: Commercial Managed Care - HMO | Admitting: Internal Medicine

## 2015-01-12 ENCOUNTER — Other Ambulatory Visit: Payer: Self-pay | Admitting: Cardiology

## 2015-02-26 ENCOUNTER — Other Ambulatory Visit: Payer: Self-pay | Admitting: *Deleted

## 2015-02-26 ENCOUNTER — Other Ambulatory Visit: Payer: Self-pay | Admitting: Internal Medicine

## 2015-02-26 MED ORDER — ATORVASTATIN CALCIUM 20 MG PO TABS: ORAL_TABLET | ORAL | Status: DC

## 2015-03-07 ENCOUNTER — Other Ambulatory Visit: Payer: Self-pay | Admitting: Cardiology

## 2015-03-20 ENCOUNTER — Encounter (HOSPITAL_COMMUNITY): Payer: Commercial Managed Care - HMO

## 2015-03-20 ENCOUNTER — Ambulatory Visit: Payer: Commercial Managed Care - HMO | Admitting: Vascular Surgery

## 2015-03-22 ENCOUNTER — Other Ambulatory Visit: Payer: Self-pay | Admitting: Internal Medicine

## 2015-04-05 ENCOUNTER — Other Ambulatory Visit: Payer: Self-pay | Admitting: Cardiology

## 2015-04-05 NOTE — Telephone Encounter (Signed)
Rx request sent to pharmacy.  

## 2015-04-10 ENCOUNTER — Encounter: Payer: Self-pay | Admitting: Vascular Surgery

## 2015-04-17 ENCOUNTER — Encounter: Payer: Self-pay | Admitting: Vascular Surgery

## 2015-04-17 ENCOUNTER — Other Ambulatory Visit: Payer: Self-pay | Admitting: *Deleted

## 2015-04-17 ENCOUNTER — Ambulatory Visit (HOSPITAL_COMMUNITY)
Admission: RE | Admit: 2015-04-17 | Discharge: 2015-04-17 | Disposition: A | Discharge status: Home / Self Care | Payer: Commercial Managed Care - HMO | Source: Ambulatory Visit | Attending: Vascular Surgery | Admitting: Vascular Surgery

## 2015-04-17 ENCOUNTER — Ambulatory Visit (INDEPENDENT_AMBULATORY_CARE_PROVIDER_SITE_OTHER): Payer: Commercial Managed Care - HMO | Admitting: Vascular Surgery

## 2015-04-17 VITALS — BP 156/73 | HR 68 | Ht 67.0 in | Wt 187.0 lb

## 2015-04-17 DIAGNOSIS — E785 Hyperlipidemia, unspecified: Secondary | ICD-10-CM | POA: Insufficient documentation

## 2015-04-17 DIAGNOSIS — I10 Essential (primary) hypertension: Secondary | ICD-10-CM | POA: Diagnosis not present

## 2015-04-17 DIAGNOSIS — I70219 Atherosclerosis of native arteries of extremities with intermittent claudication, unspecified extremity: Secondary | ICD-10-CM

## 2015-04-17 DIAGNOSIS — I739 Peripheral vascular disease, unspecified: Secondary | ICD-10-CM

## 2015-04-17 DIAGNOSIS — I82402 Acute embolism and thrombosis of unspecified deep veins of left lower extremity: Secondary | ICD-10-CM | POA: Diagnosis not present

## 2015-04-17 NOTE — Progress Notes (Signed)
[facility]ist [facility]  Patient name: Tammy [name] MRN: [id number] DOB: [birth year] Sex: female  REASON FOR VISIT: Follow up of peripheral vascular disease.  HPI: Tammy [name] is a 82 y.o. female who I last saw on 03/14/2014. She has a history of left calf claudication. She also been diagnosed with a DVT of the left femoral vein and left popliteal vein. The patient was on Eliquis for this. At the time of her last visit a year ago, on the left side, which was the symptomatic side, she had monophasic Doppler signals in the foot. ABI was not obtained because of her DVT. However toe pressure on the left was 80 mmHg. On the right side she had triphasic Doppler signals.  Her duplex scan from 02/26/2014 showed DVT involving the left femoral vein and popliteal vein. She also had a pulmonary embolus during that hospitalization. She was to continue Eliquis her 6 months. She comes in for a 1 year follow up visit.  Since I saw her last, she denies any history of claudication in the left leg. I suspect that her activities fairly limited. She denies any history of rest pain or history of nonhealing ulcers. She does get some swelling in both lower extremities especially on the left side.  Past Medical History  Diagnosis Date  . BREAST BIOPSY, HX OF 06/10/2006  . HIP PAIN, LEFT 01/31/2007  . HYPERLIPIDEMIA 06/10/2006  . HYPERTENSION 06/10/2006  . NEOPLASMS UNSPEC NATURE BONE SOFT TISSUE&SKIN 05/31/2008  . OSTEOPOROSIS 06/10/2006  . Impaired glucose tolerance   . Breast cyst   . Pulmonary embolus ([facility])     Family History  Problem Relation Age of Onset  . Heart disease Mother     Pacemaker  . Heart disease Father   . Asthma Father   . Diabetes Father   . Cancer Maternal Aunt     breast  . Cancer Sister   . Heart disease Sister   . Hyperlipidemia Sister   . Hypertension Sister     SOCIAL HISTORY: Social History  Substance Use Topics  . Smoking status: Never Smoker   . Smokeless  tobacco: Current User    Types: Snuff  . Alcohol Use: No    No Known Allergies  Current Outpatient Prescriptions  Medication Sig Dispense Refill  . amoxicillin (AMOXIL) 500 MG capsule Take 1 capsule (500 mg total) by mouth 3 (three) times daily. 21 capsule 0  . atorvastatin (LIPITOR) 20 MG tablet TAKE 1 TABLET (20 MG TOTAL) BY MOUTH DAILY AT 6 PM. 30 tablet 5  . diltiazem (CARDIZEM CD) 120 MG 24 hr capsule TAKE 1 CAPSULE (120 MG TOTAL) BY MOUTH DAILY. 30 capsule 5  . ELIQUIS 5 MG TABS tablet TAKE 1 TABLET BY MOUTH 2 TIMES DAILY. 60 tablet 5  . furosemide (LASIX) 20 MG tablet TAKE 1 TABLET (20 MG TOTAL) BY MOUTH DAILY. 90 tablet 0   No current facility-administered medications for this visit.    REVIEW OF SYSTEMS:  [X]  denotes positive finding, [ ]  denotes negative finding Cardiac  Comments:  Chest pain or chest pressure:    Shortness of breath upon exertion: X   Short of breath when lying flat:    Irregular heart rhythm:        Vascular    Pain in calf, thigh, or hip brought on by ambulation: X Left calf  Pain in feet at night that wakes you up from your sleep:     Blood clot  in your veins:    Leg swelling:  X       Pulmonary    Oxygen at home:    Productive cough:     Wheezing:         Neurologic    Sudden weakness in arms or legs:     Sudden numbness in arms or legs:     Sudden onset of difficulty speaking or slurred speech:    Temporary loss of vision in one eye:     Problems with dizziness:         Gastrointestinal    Blood in stool:     Vomited blood:         Genitourinary    Burning when urinating:     Blood in urine:        Psychiatric    Major depression:         Hematologic    Bleeding problems:    Problems with blood clotting too easily:        Skin    Rashes or ulcers:        Constitutional    Fever or chills:      PHYSICAL EXAM: Filed Vitals:   04/17/15 1400  Height: 5\' 7"  (1.702 m)  Weight: 187 lb (84.823 kg)    GENERAL: The  patient is a well-nourished female, in no acute distress. The vital signs are documented above. CARDIAC: There is a regular rate and rhythm.  VASCULAR: I do not detect carotid bruits. Has palpable femoral pulses and palpable pedal pulses on the right. I cannot palpate pedal pulses on the left. She does have mild bilateral lower extremity swelling. PULMONARY: There is good air exchange bilaterally without wheezing or rales. ABDOMEN: Soft and non-tender with normal pitched bowel sounds.  MUSCULOSKELETAL: There are no major deformities or cyanosis. NEUROLOGIC: No focal weakness or paresthesias are detected. SKIN: She has hyperpigmentation bilaterally consistent with chronic venous insufficiency. She also has some spider veins and telangiectasias bilaterally. PSYCHIATRIC: The patient has a normal affect.  DATA:   ARTERIAL DOPPLER STUDY: I have independently interpreted her arterial Doppler study.  On the left side, which is the symptomatic side, there is a monophasic posterior tibial signal and monophasic dorsalis pedis signal. ABI on the left is 47%. Toe pressure on the left is 54 mmHg. The toe pressure has decreased compared to the study a year ago. An ABI was not performed a year ago because of her DVT.  On the right side, there is a triphasic dorsalis pedis and posterior tibial signal with an ABI of 100%. Temperature on the right is 131 mmHg.  MEDICAL ISSUES:  LEFT LOWER EXTREMITY INFRAINGUINAL ARTERIAL OCCLUSIVE DISEASE: Despite the fact that she has significant infrainguinal arterial occlusive disease on the left, she has very few symptoms in the left leg. She really denies claudication on the left. Her activity may be fairly limited. She denies any history of rest pain or nonhealing ulcers. She has had a DVT in that left leg, and also a pulmonary embolus, and is still on Eliquis. She has a follow up study with Dr. Stanford Breed in the near future she says. Fortunately, she is not a smoker. I have  encouraged her to stay as active as possible. She has to include only consider arteriography if she developed rest pain or nonhealing ulcer. I'll see her back in one year with follow up ABIs. She knows to call sooner if she has problems.  HYPERTENSION: The patient's initial blood  pressure today was elevated. We repeated this and this was still elevated. We have encouraged the patient to follow up with their primary care physician for management of their blood pressure.   Deitra Mayo [facility] [place]: [phone]

## 2015-05-11 NOTE — Progress Notes (Signed)
      HPI: FU syncope. Patient with history of syncope felt secondary to autonomic dysfunction. Previously loop monitor considered; Dr Lovena Le then felt it would be indicated if recurrent syncope. H/O pulmonary embolus; seen by pulmonary and chronic anticoagulation felt indicated. Echo 10/16 showed normal LV function, mild AI, moderate LAE, mild RVE; moderate RAE, moderate to severe TR. Followed by vascular surgery for PVD. Since last seen, She has some dyspnea on exertion but no orthopnea, PND, palpitations, syncope or chest pain. Minimal pedal edema.  Current Outpatient Prescriptions  Medication Sig Dispense Refill  . atorvastatin (LIPITOR) 20 MG tablet TAKE 1 TABLET (20 MG TOTAL) BY MOUTH DAILY AT 6 PM. 30 tablet 5  . diltiazem (CARDIZEM CD) 120 MG 24 hr capsule TAKE 1 CAPSULE (120 MG TOTAL) BY MOUTH DAILY. 30 capsule 5  . ELIQUIS 5 MG TABS tablet TAKE 1 TABLET BY MOUTH 2 TIMES DAILY. 60 tablet 5  . furosemide (LASIX) 20 MG tablet TAKE 1 TABLET (20 MG TOTAL) BY MOUTH DAILY. 90 tablet 0   No current facility-administered medications for this visit.     Past Medical History  Diagnosis Date  . BREAST BIOPSY, HX OF 06/10/2006  . HIP PAIN, LEFT 01/31/2007  . HYPERLIPIDEMIA 06/10/2006  . HYPERTENSION 06/10/2006  . NEOPLASMS UNSPEC NATURE BONE SOFT TISSUE&SKIN 05/31/2008  . OSTEOPOROSIS 06/10/2006  . Impaired glucose tolerance   . Breast cyst   . Pulmonary embolus [facility])     Past Surgical History  Procedure Laterality Date  . Appendectomy    . Abdominal hysterectomy    . Breast surgery      bx x2  . Cholecystectomy  1980    Social History   Social History  . Marital Status: Married    Spouse Name: N/A  . Number of Children: 4  . Years of Education: N/A   Occupational History  . Not on file.   Social History Main Topics  . Smoking status: Never Smoker   . Smokeless tobacco: Current User    Types: Snuff  . Alcohol Use: No  . Drug Use: No  . Sexual Activity: Yes   Other  Topics Concern  . Not on file   Social History Narrative    Family History  Problem Relation Age of Onset  . Heart disease Mother     Pacemaker  . Heart disease Father   . Asthma Father   . Diabetes Father   . Cancer Maternal Aunt     breast  . Cancer Sister   . Heart disease Sister   . Hyperlipidemia Sister   . Hypertension Sister     ROS: no fevers or chills, productive cough, hemoptysis, dysphasia, odynophagia, melena, hematochezia, dysuria, hematuria, rash, seizure activity, orthopnea, PND, pedal edema, claudication. Remaining systems are negative.  Physical Exam: Well-developed well-nourished in no acute distress.  Skin is warm and dry.  HEENT is normal.  Neck is supple.  Chest is clear to auscultation with normal expansion.  Cardiovascular exam is regular rate and rhythm.  Abdominal exam nontender or distended. No masses palpated. Extremities show no edema. neuro grossly intact  ECG Sinus rhythm with first-degree AV block, PACs and PVCs. Right bundle branch block.

## 2015-05-13 ENCOUNTER — Encounter: Payer: Self-pay | Admitting: Cardiology

## 2015-05-13 ENCOUNTER — Ambulatory Visit (INDEPENDENT_AMBULATORY_CARE_PROVIDER_SITE_OTHER): Payer: Commercial Managed Care - HMO | Admitting: Cardiology

## 2015-05-13 ENCOUNTER — Ambulatory Visit (INDEPENDENT_AMBULATORY_CARE_PROVIDER_SITE_OTHER): Payer: Commercial Managed Care - HMO | Admitting: Family Medicine

## 2015-05-13 ENCOUNTER — Encounter: Payer: Self-pay | Admitting: Family Medicine

## 2015-05-13 VITALS — BP 154/68 | HR 77 | Ht 67.0 in | Wt 189.0 lb

## 2015-05-13 VITALS — BP 140/80 | HR 93 | Temp 98.2°F | Wt 188.8 lb

## 2015-05-13 DIAGNOSIS — I071 Rheumatic tricuspid insufficiency: Secondary | ICD-10-CM | POA: Diagnosis not present

## 2015-05-13 DIAGNOSIS — I10 Essential (primary) hypertension: Secondary | ICD-10-CM

## 2015-05-13 DIAGNOSIS — R55 Syncope and collapse: Secondary | ICD-10-CM

## 2015-05-13 DIAGNOSIS — N3090 Cystitis, unspecified without hematuria: Secondary | ICD-10-CM

## 2015-05-13 DIAGNOSIS — R300 Dysuria: Secondary | ICD-10-CM | POA: Diagnosis not present

## 2015-05-13 DIAGNOSIS — N3001 Acute cystitis with hematuria: Secondary | ICD-10-CM

## 2015-05-13 DIAGNOSIS — I2699 Other pulmonary embolism without acute cor pulmonale: Secondary | ICD-10-CM

## 2015-05-13 DIAGNOSIS — I739 Peripheral vascular disease, unspecified: Secondary | ICD-10-CM

## 2015-05-13 DIAGNOSIS — E785 Hyperlipidemia, unspecified: Secondary | ICD-10-CM

## 2015-05-13 LAB — POC URINALSYSI DIPSTICK (AUTOMATED)
BILIRUBIN UA: NEGATIVE
GLUCOSE UA: NEGATIVE
KETONES UA: NEGATIVE
NITRITE UA: NEGATIVE
PH UA: 5
SPEC GRAV UA: 1.02
Urobilinogen, UA: 0.2

## 2015-05-13 MED ORDER — SULFAMETHOXAZOLE-TRIMETHOPRIM 800-160 MG PO TABS: 1.0000 | ORAL_TABLET | Freq: Two times a day (BID) | ORAL | Status: DC

## 2015-05-13 NOTE — Assessment & Plan Note (Signed)
No recurrent episodes. 

## 2015-05-13 NOTE — Addendum Note (Signed)
Addended by: Gari Crown D on: 05/13/2015 02:47 PM   Modules accepted: Orders

## 2015-05-13 NOTE — Progress Notes (Signed)
Pre visit review using our clinic review tool, if applicable. No additional management support is needed unless otherwise documented below in the visit note. 

## 2015-05-13 NOTE — Patient Instructions (Signed)
Your physician wants you to follow-up in: 6 MONTHS WITH DR CRENSHAW You will receive a reminder letter in the mail two months in advance. If you don't receive a letter, please call our office to schedule the follow-up appointment.   If you need a refill on your cardiac medications before your next appointment, please call your pharmacy.  

## 2015-05-13 NOTE — Assessment & Plan Note (Signed)
Continue statin. Followed by vascular surgery. 

## 2015-05-13 NOTE — Assessment & Plan Note (Signed)
Continue statin. 

## 2015-05-13 NOTE — Assessment & Plan Note (Signed)
Patient has seen pulmonary and long-term anticoagulation recommended. Continue apixaban. We will plan to check hemoglobin and renal function when she returns in 6 months.

## 2015-05-13 NOTE — Assessment & Plan Note (Signed)
Tricuspid regurgitation likely secondary to previous pulmonary embolus and pulmonary hypertension.We'll plan follow-up echoes in the future.

## 2015-05-13 NOTE — Patient Instructions (Signed)

## 2015-05-13 NOTE — Progress Notes (Signed)
Subjective:    Patient ID: Tammy [name], female    DOB: [birth year], 82 y.o.   MRN: [id number]  HPI  Tammy [name] is an 82 year old female who presents today with dysuria and mild itching/buring.  Burning, frequency, and urgency noted with urination.  She denies hematuria, fever, chills, sweats, and flank pain.  Dysuria symptoms have been present for 1 to 2 weeks and no treatments have been tried at home. Previous UTI noted 4 months ago.  She states that she has noted "lower back" mild pain that is present "at times" and can be associated with standing prior to walking. She denies any radicular pain, numbness, tingling, flank pain, or loss of control of bowels/bladder. She states that pain improves with walking and describes it as stiff when going from a sitting to a standing position.   Review of Systems  Constitutional: Negative for fever and chills.  Respiratory: Negative for cough and shortness of breath.   Cardiovascular: Negative for chest pain and palpitations.  Gastrointestinal: Negative for nausea, vomiting, diarrhea, constipation and blood in stool.  Genitourinary: Positive for dysuria, urgency and frequency. Negative for hematuria and flank pain.  Musculoskeletal: Positive for back pain.       Objective:   Physical Exam  Constitutional: She is oriented to person, place, and time. She appears well-developed and well-nourished.  Cardiovascular: Intact distal pulses.   Irregular rhythm noted. ECG completed today in cardiologist office is noted as follows: ECG Sinus rhythm with first-degree AV block, PACs and PVCs. Right bundle branch block. Intact distal pulses that are decreased.   Pulmonary/Chest: She has no wheezes. She has no rales.  Abdominal: Soft. Bowel sounds are normal. She exhibits no distension.  No suprapubic tenderness noted  Musculoskeletal: She exhibits edema.  1+ nonpitting edema noted in ankles bilaterally  No CVA tenderness noted. Tenderness noted to palpation in  right lower back area.   Neurological: She is alert and oriented to person, place, and time.  Skin: Skin is warm and dry. No rash noted.  Psychiatric: She has a normal mood and affect. Her behavior is normal. Judgment and thought content normal.   Past Medical History  Diagnosis Date  . BREAST BIOPSY, HX OF 06/10/2006  . HIP PAIN, LEFT 01/31/2007  . HYPERLIPIDEMIA 06/10/2006  . HYPERTENSION 06/10/2006  . NEOPLASMS UNSPEC NATURE BONE SOFT TISSUE&SKIN 05/31/2008  . OSTEOPOROSIS 06/10/2006  . Impaired glucose tolerance   . Breast cyst   . Pulmonary embolus [facility])      Social History   Social History  . Marital Status: Married    Spouse Name: N/A  . Number of Children: 4  . Years of Education: N/A   Occupational History  . Not on file.   Social History Main Topics  . Smoking status: Never Smoker   . Smokeless tobacco: Current User    Types: Snuff  . Alcohol Use: No  . Drug Use: No  . Sexual Activity: Yes   Other Topics Concern  . Not on file   Social History Narrative    Past Surgical History  Procedure Laterality Date  . Appendectomy    . Abdominal hysterectomy    . Breast surgery      bx x2  . Cholecystectomy  1980    Family History  Problem Relation [name] of Onset  . Heart disease Mother     Pacemaker  . Heart disease Father   . Asthma Father   . Diabetes Father   .  Cancer Maternal Aunt     breast  . Cancer Sister   . Heart disease Sister   . Hyperlipidemia Sister   . Hypertension Sister     No Known Allergies  Current Outpatient Prescriptions on File Prior to Visit  Medication Sig Dispense Refill  . atorvastatin (LIPITOR) 20 MG tablet TAKE 1 TABLET (20 MG TOTAL) BY MOUTH DAILY AT 6 PM. 30 tablet 5  . diltiazem (CARDIZEM CD) 120 MG 24 hr capsule TAKE 1 CAPSULE (120 MG TOTAL) BY MOUTH DAILY. 30 capsule 5  . ELIQUIS 5 MG TABS tablet TAKE 1 TABLET BY MOUTH 2 TIMES DAILY. 60 tablet 5  . furosemide (LASIX) 20 MG tablet TAKE 1 TABLET (20 MG TOTAL) BY MOUTH  DAILY. 90 tablet 0   No current facility-administered medications on file prior to visit.    BP 140/80 mmHg  Pulse 93  Temp(Src) 98.2 F (36.8 C) (Oral)  Wt 188 lb 12.8 oz (85.639 kg)      Assessment & Plan:  1. Cystitis Urinalysis positive for leukocytes and microscopic blood. Culture will be obtained. Advised patient to report any fever, chills, sweats, or worsening symptoms. RTC will be determined after culture results are obtained.  - sulfamethoxazole-trimethoprim (BACTRIM DS,SEPTRA DS) 800-160 MG tablet; Take 1 tablet by mouth 2 (two) times daily.  Dispense: 20 tablet; Refill: 0  2. Dysuria  - POCT Urinalysis Dipstick (Automated)

## 2015-05-13 NOTE — Assessment & Plan Note (Signed)
Blood pressure is mildly elevated.I have asked her to follow this at home and we will adjust regimen as needed.

## 2015-05-16 ENCOUNTER — Telehealth: Payer: Self-pay | Admitting: Family Medicine

## 2015-05-16 DIAGNOSIS — N3090 Cystitis, unspecified without hematuria: Secondary | ICD-10-CM

## 2015-05-16 LAB — URINE CULTURE: Colony Count: >=100000

## 2015-05-16 MED ORDER — AMOXICILLIN-POT CLAVULANATE 875-125 MG PO TABS: 1.0000 | ORAL_TABLET | Freq: Two times a day (BID) | ORAL | Status: DC

## 2015-05-16 NOTE — Telephone Encounter (Signed)
Please stop the current antibiotic that you are taking for your urinary tract symptoms. After receiving the urine culture results, a new antibiotic which is sensitive to the bacteria in your urine has been prescribed. Augmentin has been prescribed for you for 7 days. Please take this antibiotic with food and return to clinic if symptoms persist, worsen, or you develop a fever >101.

## 2015-05-16 NOTE — Telephone Encounter (Signed)
Pt is aware.  

## 2015-06-17 ENCOUNTER — Encounter: Payer: Self-pay | Admitting: Internal Medicine

## 2015-06-17 ENCOUNTER — Ambulatory Visit (INDEPENDENT_AMBULATORY_CARE_PROVIDER_SITE_OTHER): Payer: Commercial Managed Care - HMO | Admitting: Internal Medicine

## 2015-06-17 VITALS — BP 134/60 | HR 74 | Temp 98.5°F | Ht 67.0 in | Wt 184.0 lb

## 2015-06-17 DIAGNOSIS — M7552 Bursitis of left shoulder: Secondary | ICD-10-CM

## 2015-06-17 DIAGNOSIS — I10 Essential (primary) hypertension: Secondary | ICD-10-CM | POA: Diagnosis not present

## 2015-06-17 MED ORDER — METHYLPREDNISOLONE ACETATE 80 MG/ML IJ SUSP
80.0000 mg | Freq: Once | INTRAMUSCULAR | Status: AC
  Administered 2015-06-17: 80 mg via INTRAMUSCULAR

## 2015-06-17 NOTE — Patient Instructions (Addendum)
You  may move around, but avoid painful motions and activities.  Apply ice to the sore area for 15 to 20 minutes 3 or 4 times daily for the next two to 3 days.  Call or return to clinic prn if these symptoms worsen or fail to improve as anticipated.   Take Aleve 200 mg twice daily for pain or swelling

## 2015-06-17 NOTE — Progress Notes (Signed)
Subjective:    Patient ID: Tammy [name], female    DOB: [birth year], 82 y.o.   MRN: [id number]  HPI  82 year old patient who has essential hypertension.  She presents with a seven-day history of left shoulder pain.  She awoke one morning.  7 days ago with significant pain involving primarily the left posterolateral shoulder area.  She has pain with the range of motion.  No history of trauma or unusual activities.  No similar shoulder pain in the past   Past Medical History  Diagnosis Date  . BREAST BIOPSY, HX OF 06/10/2006  . HIP PAIN, LEFT 01/31/2007  . HYPERLIPIDEMIA 06/10/2006  . HYPERTENSION 06/10/2006  . NEOPLASMS UNSPEC NATURE BONE SOFT TISSUE&SKIN 05/31/2008  . OSTEOPOROSIS 06/10/2006  . Impaired glucose tolerance   . Breast cyst   . Pulmonary embolus [facility])      Social History   Social History  . Marital Status: Married    Spouse Name: N/A  . Number of Children: 4  . Years of Education: N/A   Occupational History  . Not on file.   Social History Main Topics  . Smoking status: Never Smoker   . Smokeless tobacco: Current User    Types: Snuff  . Alcohol Use: No  . Drug Use: No  . Sexual Activity: Yes   Other Topics Concern  . Not on file   Social History Narrative    Past Surgical History  Procedure Laterality Date  . Appendectomy    . Abdominal hysterectomy    . Breast surgery      bx x2  . Cholecystectomy  1980    Family History  Problem Relation [name] of Onset  . Heart disease Mother     Pacemaker  . Heart disease Father   . Asthma Father   . Diabetes Father   . Cancer Maternal Aunt     breast  . Cancer Sister   . Heart disease Sister   . Hyperlipidemia Sister   . Hypertension Sister     No Known Allergies  Current Outpatient Prescriptions on File Prior to Visit  Medication Sig Dispense Refill  . atorvastatin (LIPITOR) 20 MG tablet TAKE 1 TABLET (20 MG TOTAL) BY MOUTH DAILY AT 6 PM. 30 tablet 5  . diltiazem (CARDIZEM CD) 120 MG 24 hr capsule  TAKE 1 CAPSULE (120 MG TOTAL) BY MOUTH DAILY. 30 capsule 5  . ELIQUIS 5 MG TABS tablet TAKE 1 TABLET BY MOUTH 2 TIMES DAILY. 60 tablet 5  . furosemide (LASIX) 20 MG tablet TAKE 1 TABLET (20 MG TOTAL) BY MOUTH DAILY. 90 tablet 0   No current facility-administered medications on file prior to visit.    BP 134/60 mmHg  Pulse 74  Temp(Src) 98.5 F (36.9 C) (Oral)  Ht 5\' 7"  (1.702 m)  Wt 184 lb (83.462 kg)  BMI 28.81 kg/m2  SpO2 98%     Review of Systems  Constitutional: Negative.   HENT: Negative for congestion, dental problem, hearing loss, rhinorrhea, sinus pressure, sore throat and tinnitus.   Eyes: Negative for pain, discharge and visual disturbance.  Respiratory: Negative for cough and shortness of breath.   Cardiovascular: Negative for chest pain, palpitations and leg swelling.  Gastrointestinal: Negative for nausea, vomiting, abdominal pain, diarrhea, constipation, blood in stool and abdominal distention.  Genitourinary: Negative for dysuria, urgency, frequency, hematuria, flank pain, vaginal bleeding, vaginal discharge, difficulty urinating, vaginal pain and pelvic pain.  Musculoskeletal: Negative for joint swelling, arthralgias and gait problem.  Left shoulder pain  Skin: Negative for rash.  Neurological: Negative for dizziness, syncope, speech difficulty, weakness, numbness and headaches.  Hematological: Negative for adenopathy.  Psychiatric/Behavioral: Negative for behavioral problems, dysphoric mood and agitation. The patient is not nervous/anxious.        Objective:   Physical Exam  Constitutional: She appears well-developed and well-nourished. No distress.  Blood pressure well controlled  Musculoskeletal:  No redness or excessive warmth involving the shoulder No rash Point tenderness along the posterior lateral shoulder area Range of motion is painful          Assessment & Plan:   Left shoulder pain.  Suspect bursitis.  Will treat with Depo-Medrol  82.  We'll apply cold compresses over the next few days Hypertension, stable   [name], MD

## 2015-06-17 NOTE — Addendum Note (Signed)
Addended by: Vernetta Honey on: 06/17/2015 01:30 PM   Modules accepted: Orders, SmartSet

## 2015-07-08 ENCOUNTER — Encounter: Payer: Self-pay | Admitting: Internal Medicine

## 2015-07-08 ENCOUNTER — Ambulatory Visit (INDEPENDENT_AMBULATORY_CARE_PROVIDER_SITE_OTHER): Payer: Commercial Managed Care - HMO | Admitting: Internal Medicine

## 2015-07-08 ENCOUNTER — Other Ambulatory Visit: Payer: Self-pay

## 2015-07-08 VITALS — BP 144/88 | HR 71 | Temp 98.2°F | Ht 67.0 in | Wt 180.0 lb

## 2015-07-08 DIAGNOSIS — M810 Age-related osteoporosis without current pathological fracture: Secondary | ICD-10-CM | POA: Diagnosis not present

## 2015-07-08 DIAGNOSIS — I10 Essential (primary) hypertension: Secondary | ICD-10-CM | POA: Diagnosis not present

## 2015-07-08 DIAGNOSIS — E785 Hyperlipidemia, unspecified: Secondary | ICD-10-CM | POA: Diagnosis not present

## 2015-07-08 DIAGNOSIS — I480 Paroxysmal atrial fibrillation: Secondary | ICD-10-CM | POA: Diagnosis not present

## 2015-07-08 DIAGNOSIS — Z0000 Encounter for general adult medical examination without abnormal findings: Secondary | ICD-10-CM

## 2015-07-08 LAB — LIPID PANEL
CHOL/HDL RATIO: 2
Cholesterol: 152 mg/dL (ref 0–200)
HDL: 67.7 mg/dL (ref >39.00)
LDL CALC: 69 mg/dL (ref 0–99)
NonHDL: 84.47
TRIGLYCERIDES: 75 mg/dL (ref 0.0–149.0)
VLDL: 15 mg/dL (ref 0.0–40.0)

## 2015-07-08 LAB — COMPREHENSIVE METABOLIC PANEL
ALK PHOS: 63 U/L (ref 39–117)
ALT: 13 U/L (ref 0–35)
AST: 13 U/L (ref 0–37)
Albumin: 4.1 g/dL (ref 3.5–5.2)
BILIRUBIN TOTAL: 0.9 mg/dL (ref 0.2–1.2)
BUN: 35 mg/dL — ABNORMAL HIGH (ref 6–23)
CALCIUM: 9.5 mg/dL (ref 8.4–10.5)
CHLORIDE: 105 meq/L (ref 96–112)
CO2: 28 meq/L (ref 19–32)
Creatinine, Ser: 1.03 mg/dL (ref 0.40–1.20)
GFR: 54.54 mL/min — AB (ref >60.00)
Glucose, Bld: 130 mg/dL — ABNORMAL HIGH (ref 70–99)
POTASSIUM: 4.5 meq/L (ref 3.5–5.1)
Sodium: 142 mEq/L (ref 135–145)
Total Protein: 6.8 g/dL (ref 6.0–8.3)

## 2015-07-08 LAB — CBC WITH DIFFERENTIAL/PLATELET
Basophils Absolute: 0 10*3/uL (ref 0.0–0.1)
Basophils Relative: 0.5 % (ref 0.0–3.0)
Eosinophils Absolute: 0.1 10*3/uL (ref 0.0–0.7)
Eosinophils Relative: 0.6 % (ref 0.0–5.0)
HEMATOCRIT: 41.2 % (ref 36.0–46.0)
Hemoglobin: 13.7 g/dL (ref 12.0–15.0)
LYMPHS ABS: 2.4 10*3/uL (ref 0.7–4.0)
LYMPHS PCT: 24.6 % (ref 12.0–46.0)
MCHC: 33.3 g/dL (ref 30.0–36.0)
MCV: 92.4 fl (ref 78.0–100.0)
MONOS PCT: 7.8 % (ref 3.0–12.0)
Monocytes Absolute: 0.8 10*3/uL (ref 0.1–1.0)
NEUTROS PCT: 66.5 % (ref 43.0–77.0)
Neutro Abs: 6.6 10*3/uL (ref 1.4–7.7)
Platelets: 272 10*3/uL (ref 150.0–400.0)
RBC: 4.46 Mil/uL (ref 3.87–5.11)
RDW: 15.3 % (ref 11.5–15.5)
WBC: 9.9 10*3/uL (ref 4.0–10.5)

## 2015-07-08 LAB — TSH: TSH: 2.01 u[IU]/mL (ref 0.35–4.50)

## 2015-07-08 MED ORDER — ATORVASTATIN CALCIUM 20 MG PO TABS: ORAL_TABLET | ORAL | Status: DC

## 2015-07-08 MED ORDER — APIXABAN 5 MG PO TABS: 5.0000 mg | ORAL_TABLET | Freq: Two times a day (BID) | ORAL | Status: DC

## 2015-07-08 MED ORDER — DILTIAZEM HCL ER COATED BEADS 120 MG PO CP24: 120.0000 mg | ORAL_CAPSULE | Freq: Every day | ORAL | Status: DC

## 2015-07-08 MED ORDER — FUROSEMIDE 20 MG PO TABS: 20.0000 mg | ORAL_TABLET | Freq: Every day | ORAL | Status: DC

## 2015-07-08 NOTE — Patient Instructions (Signed)
Limit your sodium (Salt) intake Take a calcium supplement, plus 800-1200 units of vitamin D  Return in 6 months for follow-up  

## 2015-07-08 NOTE — Progress Notes (Signed)
Subjective:    Patient ID: Tammy Tammy [name], female    DOB: [birth year], 82 y.o.   MRN: [id number]  HPI 59  -year-old patient, lifelong nonsmoker, who has a history of hypertension, and dyslipidemia.  She is seen today for a preventive health examination In February 2016, she was admitted for acute pulmonary embolism associated with syncope. An event monitor in the past has revealed PAF and the patient has been on chronic anticoagulation.  She is followed by cardiology  Only complaint today is decreased range of motion of the left shoulder which seems to be improving  Medical problems include peripheral vascular disease, osteoarthritis, and osteoporosis.  She is now being followed by vascular surgery.    Medicare wellness exam  1. Risk factors, based on past M,S,F history- cardiovascular risk factors include Tammy [name] hypertension and dyslipidemia .  She has known PAD 2. Physical activities: Fairly active and independent but no rigorous exercise program .  History of left leg claudication 3. Depression/mood: No history depression or mood disorder  4. Hearing: No major deficits  5. ADL's: Remains independent in all aspects of daily living  6. Fall risk: Moderate due to Tammy [name]  34. Home safety: No problems identified  8. Height weight, and visual acuity; height and weight fairly stable. There has been a voluntary 8 pound weight loss no change in visual acuity  9. Counseling: Heart healthy diet more regular exercise are encouraged  10. Lab orders based on risk factors: Laboratory profile including lipid panel will be reviewed  11. Referral : Follow-up cardiology and vascular surgery 12. Care plan: Low-salt heart healthy diet encouraged we'll continue calcium and vitamin D supplements  13. Cognitive assessment: Alert and oriented with normal affect. No cognitive dysfunction  14.  Patient was provided with a written and personalized care plan.  Preventive services Should include an annual health  examination with screening lab.  Annual eye examination recommended.  Cardiology follow-up in vascular surgery follow-up.  Also encouraged  15.  Provider list includes primary care cardiology, ophthalmology and vascular surgery as well as cardiology  Preventive Screening-Counseling & Management  Alcohol-Tobacco  Smoking Status: never   Allergies (verified):  No Known Drug Allergies   Past History:  Past Medical History:   Hyperlipidemia  Hypertension  Osteoporosis  Recurrent syncope  PAD  acute pulmonary embolism February 2016 PAF    Past Surgical History:   Appendectomy  Cholecystectomy  Hysterectomy  gravida 4, para 4, abortus zero  breast biopsies x 2 in 1985, 1998  colonoscopy- none (declines)   Family History:   father died at Tammy [name] 69, history of asthma, CAD  mother died Tammy [name] 8 of CAD  One brother died of accidental death  two half sisters, positive CAD  one sister is well   Social History:    9 grandchildren and 9 great grandchildren      Past Medical History  Diagnosis Date  . BREAST BIOPSY, HX OF 06/10/2006  . HIP PAIN, LEFT 01/31/2007  . HYPERLIPIDEMIA 06/10/2006  . HYPERTENSION 06/10/2006  . NEOPLASMS UNSPEC NATURE BONE SOFT TISSUE&SKIN 05/31/2008  . OSTEOPOROSIS 06/10/2006  . Impaired glucose tolerance   . Breast cyst   . Pulmonary embolus [facility])     Social History   Social History  . Marital Status: Married    Spouse Name: N/A  . Number of Children: 4  . Years of Education: N/A   Occupational History  . Not on file.   Social History Main Topics  .  Smoking status: Never Smoker   . Smokeless tobacco: Current User    Types: Snuff  . Alcohol Use: No  . Drug Use: No  . Sexual Activity: Yes   Other Topics Concern  . Not on file   Social History Narrative    Past Surgical History  Procedure Laterality Date  . Appendectomy    . Abdominal hysterectomy    . Breast surgery      bx x2  . Cholecystectomy  1980    Family History    Problem Relation Tammy [name] of Onset  . Heart disease Mother     Pacemaker  . Heart disease Father   . Asthma Father   . Diabetes Father   . Cancer Maternal Aunt     breast  . Cancer Sister   . Heart disease Sister   . Hyperlipidemia Sister   . Hypertension Sister     No Known Allergies  Current Outpatient Prescriptions on File Prior to Visit  Medication Sig Dispense Refill  . atorvastatin (LIPITOR) 20 MG tablet TAKE 1 TABLET (20 MG TOTAL) BY MOUTH DAILY AT 6 PM. 30 tablet 5  . diltiazem (CARDIZEM CD) 120 MG 24 hr capsule TAKE 1 CAPSULE (120 MG TOTAL) BY MOUTH DAILY. 30 capsule 5  . ELIQUIS 5 MG TABS tablet TAKE 1 TABLET BY MOUTH 2 TIMES DAILY. 60 tablet 5  . furosemide (LASIX) 20 MG tablet TAKE 1 TABLET (20 MG TOTAL) BY MOUTH DAILY. 90 tablet 0   No current facility-administered medications on file prior to visit.    BP 144/88 mmHg  Pulse 71  Temp(Src) 98.2 F (36.8 C) (Oral)  Ht 5\' 7"  (1.702 m)  Wt 180 lb (81.647 kg)  BMI 28.19 kg/m2  SpO2 98%       Review of Systems  Constitutional: Negative.   HENT: Negative for congestion, dental problem, hearing loss, rhinorrhea, sinus pressure, sore throat and tinnitus.   Eyes: Negative for pain, discharge and visual disturbance.  Respiratory: Negative for cough and shortness of breath.   Cardiovascular: Negative for chest pain, palpitations and leg swelling.       L Leg claudication improved  Gastrointestinal: Negative for nausea, vomiting, abdominal pain, diarrhea, constipation, blood in stool and abdominal distention.  Genitourinary: Negative for dysuria, urgency, frequency, hematuria, flank pain, vaginal bleeding, vaginal discharge, difficulty urinating, vaginal pain and pelvic pain.  Musculoskeletal: Negative for joint swelling, arthralgias and gait problem.  Skin: Negative for rash.  Neurological: Negative for dizziness, syncope, speech difficulty, weakness, numbness and headaches.  Hematological: Negative for adenopathy.   Psychiatric/Behavioral: Negative for behavioral problems, dysphoric mood and agitation. The patient is not nervous/anxious.        Objective:   Physical Exam  Constitutional: She is oriented to person, place, and time. She appears well-developed and well-nourished. No distress.  HENT:  Head: Normocephalic and atraumatic.  Right Ear: External ear normal.  Left Ear: External ear normal.  Mouth/Throat: Oropharynx is clear and moist.  Eyes: Conjunctivae and EOM are normal.  Neck: Normal range of motion. Neck supple. No JVD present. No thyromegaly present.  Cardiovascular: Normal rate, regular rhythm and normal heart sounds.   No murmur heard. The right dorsalis pedis pulses full.  Other pedal pulses nonpalpable  Regular rhythm  Pulmonary/Chest: Effort normal and breath sounds normal. She has no wheezes. She has no rales.  Kyphosis  Abdominal: Soft. Bowel sounds are normal. She exhibits no distension and no mass. There is no tenderness. There is no rebound and  no guarding.  Right lower quadrant scar  Genitourinary: Vagina normal.  Musculoskeletal: Normal range of motion. She exhibits edema. She exhibits no tenderness.  Both feet were cool to touch and slightly cyanotic No ulceration. Mild edema more prominent left ankle  Neurological: She is alert and oriented to person, place, and time. She has normal reflexes. No cranial nerve deficit. She exhibits normal muscle tone. Coordination normal.  Skin: Skin is warm and dry. No rash noted.  Feet cool and sl cyanotic  Psychiatric: She has a normal mood and affect. Her behavior is normal.          Assessment & Plan:  PAD Hypertension stable Dyslipidemia Prev Health exam Osteoporosis.  Patient has been on Fosamax greater than 5 years. D/ced one yr ago  Meds updated f/u Vascular  ROV 6 months   [name], MD

## 2015-07-08 NOTE — Progress Notes (Signed)
Pre visit review using our clinic review tool, if applicable. No additional management support is needed unless otherwise documented below in the visit note. 

## 2015-08-16 ENCOUNTER — Other Ambulatory Visit: Payer: Self-pay | Admitting: Family Medicine

## 2015-08-16 MED ORDER — ATORVASTATIN CALCIUM 20 MG PO TABS: ORAL_TABLET | ORAL | Status: DC

## 2015-08-16 MED ORDER — DILTIAZEM HCL ER COATED BEADS 120 MG PO CP24: 120.0000 mg | ORAL_CAPSULE | Freq: Every day | ORAL | Status: DC

## 2015-11-06 ENCOUNTER — Encounter: Payer: Self-pay | Admitting: Cardiology

## 2015-11-10 NOTE — Progress Notes (Signed)
HPI: FU syncope. Patient with history of syncope felt secondary to autonomic dysfunction. Previously loop monitor considered; Dr Lovena Le then felt it would be indicated if recurrent syncope. H/O pulmonary embolus; seen by pulmonary and chronic anticoagulation felt indicated. Echo 10/16 showed normal LV function, mild AI, moderate LAE, mild RVE; moderate RAE, moderate to severe TR. Followed by vascular surgery for PVD. Since last seen, the patient has dyspnea with more extreme activities but not with routine activities. It is relieved with rest. It is not associated with chest pain. There is no orthopnea, PND or pedal edema. There is no syncope or palpitations. There is no exertional chest pain.   Current Outpatient Prescriptions  Medication Sig Dispense Refill  . apixaban (ELIQUIS) 5 MG TABS tablet Take 1 tablet (5 mg total) by mouth 2 (two) times daily. 60 tablet 12  . atorvastatin (LIPITOR) 20 MG tablet TAKE 1 TABLET (20 MG TOTAL) BY MOUTH DAILY AT 6 PM. 90 tablet 1  . diltiazem (CARDIZEM CD) 120 MG 24 hr capsule Take 1 capsule (120 mg total) by mouth daily. 90 capsule 1  . furosemide (LASIX) 20 MG tablet Take 1 tablet (20 mg total) by mouth daily. 90 tablet 3   No current facility-administered medications for this visit.      Past Medical History:  Diagnosis Date  . BREAST BIOPSY, HX OF 06/10/2006  . Breast cyst   . HIP PAIN, LEFT 01/31/2007  . HYPERLIPIDEMIA 06/10/2006  . HYPERTENSION 06/10/2006  . Impaired glucose tolerance   . NEOPLASMS UNSPEC NATURE BONE SOFT TISSUE&SKIN 05/31/2008  . OSTEOPOROSIS 06/10/2006  . Pulmonary embolus [facility])     Past Surgical History:  Procedure Laterality Date  . ABDOMINAL HYSTERECTOMY    . APPENDECTOMY    . BREAST SURGERY     bx x2  . CHOLECYSTECTOMY  1980    Social History   Social History  . Marital status: Married    Spouse name: N/A  . Number of children: 4  . Years of education: N/A   Occupational History  . Not on file.    Social History Main Topics  . Smoking status: Never Smoker  . Smokeless tobacco: Current User    Types: Snuff  . Alcohol use No  . Drug use: No  . Sexual activity: Yes   Other Topics Concern  . Not on file   Social History Narrative  . No narrative on file    Family History  Problem Relation Age of Onset  . Heart disease Mother     Pacemaker  . Heart disease Father   . Asthma Father   . Diabetes Father   . Cancer Maternal Aunt     breast  . Cancer Sister   . Heart disease Sister   . Hyperlipidemia Sister   . Hypertension Sister     ROS: no fevers or chills, productive cough, hemoptysis, dysphasia, odynophagia, melena, hematochezia, dysuria, hematuria, rash, seizure activity, orthopnea, PND, pedal edema, claudication. Remaining systems are negative.  Physical Exam: Well-developed well-nourished in no acute distress.  Skin is warm and dry.  HEENT is normal.  Neck is supple.  Chest is clear to auscultation with normal expansion.  Cardiovascular exam is regular rate and rhythm.  Abdominal exam nontender or distended. No masses palpated. Extremities show no edema. neuro grossly intact  ECG-Sinus rhythm with first-degree AV block, right bundle branch block, PACs.  A/P  1 Syncope-no recurrent episodes.  2 hyperlipidemia-continue statin.  3 hypertension-blood pressure elevated.  Discontinue Cardizem. Add amlodipine 10 mg daily. Follow blood pressure and adjust regimen as needed.  4 peripheral vascular disease-continue statin. No aspirin given need for anticoagulation.   5 history of pulmonary embolus-pulmonary has recommended long-term anticoagulation. Continue apixaban.   6 tricuspid regurgitation-Follow-up echocardiogram.  Kirk Ruths, MD

## 2015-11-18 ENCOUNTER — Encounter: Payer: Self-pay | Admitting: Cardiology

## 2015-11-18 ENCOUNTER — Ambulatory Visit (INDEPENDENT_AMBULATORY_CARE_PROVIDER_SITE_OTHER): Payer: Commercial Managed Care - HMO | Admitting: Cardiology

## 2015-11-18 VITALS — BP 178/96 | HR 67 | Ht 67.0 in | Wt 189.2 lb

## 2015-11-18 DIAGNOSIS — E7800 Pure hypercholesterolemia, unspecified: Secondary | ICD-10-CM

## 2015-11-18 DIAGNOSIS — I10 Essential (primary) hypertension: Secondary | ICD-10-CM | POA: Diagnosis not present

## 2015-11-18 DIAGNOSIS — I071 Rheumatic tricuspid insufficiency: Secondary | ICD-10-CM

## 2015-11-18 MED ORDER — AMLODIPINE BESYLATE 10 MG PO TABS: 10.0000 mg | ORAL_TABLET | Freq: Every day | ORAL | 3 refills | Status: DC

## 2015-11-18 NOTE — Patient Instructions (Signed)
Medication Instructions:   STOP DILTIAZEM  START AMLODIPINE 10 MG ONCE DAILY  Testing/Procedures:  Your physician has requested that you have an echocardiogram. Echocardiography is a painless test that uses sound waves to create images of your heart. It provides your doctor with information about the size and shape of your heart and how well your heart's chambers and valves are working. This procedure takes approximately one hour. There are no restrictions for this procedure.    Follow-Up:  Your physician wants you to follow-up in: [place] will receive a reminder letter in the mail two months in advance. If you don't receive a letter, please call our office to schedule the follow-up appointment.   If you need a refill on your cardiac medications before your next appointment, please call your pharmacy.

## 2015-12-04 ENCOUNTER — Ambulatory Visit (HOSPITAL_COMMUNITY): Payer: Commercial Managed Care - HMO | Attending: Cardiology

## 2015-12-04 ENCOUNTER — Other Ambulatory Visit: Payer: Self-pay

## 2015-12-04 DIAGNOSIS — I272 Pulmonary hypertension, unspecified: Secondary | ICD-10-CM | POA: Diagnosis not present

## 2015-12-04 DIAGNOSIS — I10 Essential (primary) hypertension: Secondary | ICD-10-CM | POA: Diagnosis not present

## 2015-12-04 DIAGNOSIS — Z86711 Personal history of pulmonary embolism: Secondary | ICD-10-CM | POA: Insufficient documentation

## 2015-12-04 DIAGNOSIS — I4891 Unspecified atrial fibrillation: Secondary | ICD-10-CM | POA: Diagnosis not present

## 2015-12-04 DIAGNOSIS — I071 Rheumatic tricuspid insufficiency: Secondary | ICD-10-CM | POA: Insufficient documentation

## 2015-12-04 DIAGNOSIS — E785 Hyperlipidemia, unspecified: Secondary | ICD-10-CM | POA: Insufficient documentation

## 2015-12-12 DIAGNOSIS — Z1231 Encounter for screening mammogram for malignant neoplasm of breast: Secondary | ICD-10-CM | POA: Diagnosis not present

## 2015-12-12 DIAGNOSIS — Z803 Family history of malignant neoplasm of breast: Secondary | ICD-10-CM | POA: Diagnosis not present

## 2015-12-12 LAB — HM MAMMOGRAPHY

## 2015-12-16 ENCOUNTER — Encounter: Payer: Self-pay | Admitting: Internal Medicine

## 2015-12-31 DIAGNOSIS — R922 Inconclusive mammogram: Secondary | ICD-10-CM | POA: Diagnosis not present

## 2015-12-31 DIAGNOSIS — R928 Other abnormal and inconclusive findings on diagnostic imaging of breast: Secondary | ICD-10-CM | POA: Diagnosis not present

## 2015-12-31 LAB — HM MAMMOGRAPHY

## 2016-01-07 ENCOUNTER — Ambulatory Visit (INDEPENDENT_AMBULATORY_CARE_PROVIDER_SITE_OTHER): Payer: Commercial Managed Care - HMO | Admitting: Internal Medicine

## 2016-01-07 ENCOUNTER — Encounter: Payer: Self-pay | Admitting: Internal Medicine

## 2016-01-07 DIAGNOSIS — I10 Essential (primary) hypertension: Secondary | ICD-10-CM

## 2016-01-07 DIAGNOSIS — E785 Hyperlipidemia, unspecified: Secondary | ICD-10-CM

## 2016-01-07 DIAGNOSIS — I480 Paroxysmal atrial fibrillation: Secondary | ICD-10-CM | POA: Diagnosis not present

## 2016-01-07 MED ORDER — AMLODIPINE BESYLATE 5 MG PO TABS: 5.0000 mg | ORAL_TABLET | Freq: Every day | ORAL | 3 refills | Status: DC

## 2016-01-07 NOTE — Progress Notes (Signed)
Subjective:    Patient ID: Tammy [name] Age, female    DOB: [birth year], 82 y.o.   MRN: [id number]  HPI  82 year old patient who is seen today for follow-up.  She is followed by cardiology with a history of atrial fibrillation.  She remains on chronic anticoagulation.  She has essential hypertension which has been controlled onamlodipine 10 mg daily.  She has PAD.  Her only complaint today is worsening pedal edema. Medical regimen also includes low-dose furosemide. In general doing well, although complains of easy fatigability  Past Medical History:  Diagnosis Date  . BREAST BIOPSY, HX OF 06/10/2006  . Breast cyst   . HIP PAIN, LEFT 01/31/2007  . HYPERLIPIDEMIA 06/10/2006  . HYPERTENSION 06/10/2006  . Impaired glucose tolerance   . NEOPLASMS UNSPEC NATURE BONE SOFT TISSUE&SKIN 05/31/2008  . OSTEOPOROSIS 06/10/2006  . Pulmonary embolus [facility])      Social History   Social History  . Marital status: Married    Spouse name: N/A  . Number of children: 4  . Years of education: N/A   Occupational History  . Not on file.   Social History Main Topics  . Smoking status: Never Smoker  . Smokeless tobacco: Current User    Types: Snuff  . Alcohol use No  . Drug use: No  . Sexual activity: Yes   Other Topics Concern  . Not on file   Social History Narrative  . No narrative on file    Past Surgical History:  Procedure Laterality Date  . ABDOMINAL HYSTERECTOMY    . APPENDECTOMY    . BREAST SURGERY     bx x2  . CHOLECYSTECTOMY  1980    Family History  Problem Relation Age of Onset  . Heart disease Mother     Pacemaker  . Heart disease Father   . Asthma Father   . Diabetes Father   . Cancer Maternal Aunt     breast  . Cancer Sister   . Heart disease Sister   . Hyperlipidemia Sister   . Hypertension Sister     No Known Allergies  Current Outpatient Prescriptions on File Prior to Visit  Medication Sig Dispense Refill  . amLODipine (NORVASC) 10 MG tablet Take 1 tablet (10 mg  total) by mouth daily. 90 tablet 3  . apixaban (ELIQUIS) 5 MG TABS tablet Take 1 tablet (5 mg total) by mouth 2 (two) times daily. 60 tablet 12  . atorvastatin (LIPITOR) 20 MG tablet TAKE 1 TABLET (20 MG TOTAL) BY MOUTH DAILY AT 6 PM. 90 tablet 1  . furosemide (LASIX) 20 MG tablet Take 1 tablet (20 mg total) by mouth daily. 90 tablet 3   No current facility-administered medications on file prior to visit.     There were no vitals taken for this visit.    Review of Systems  Constitutional: Positive for fatigue.  HENT: Negative for congestion, dental problem, hearing loss, rhinorrhea, sinus pressure, sore throat and tinnitus.   Eyes: Negative for pain, discharge and visual disturbance.  Respiratory: Negative for cough and shortness of breath.   Cardiovascular: Positive for leg swelling. Negative for chest pain and palpitations.  Gastrointestinal: Negative for abdominal distention, abdominal pain, blood in stool, constipation, diarrhea, nausea and vomiting.  Genitourinary: Negative for difficulty urinating, dysuria, flank pain, frequency, hematuria, pelvic pain, urgency, vaginal bleeding, vaginal discharge and vaginal pain.  Musculoskeletal: Negative for arthralgias, gait problem and joint swelling.  Skin: Negative for rash.  Neurological: Positive for weakness. Negative for  dizziness, syncope, speech difficulty, numbness and headaches.  Hematological: Negative for adenopathy.  Psychiatric/Behavioral: Negative for agitation, behavioral problems and dysphoric mood. The patient is not nervous/anxious.        Objective:   Physical Exam  Constitutional: She is oriented to person, place, and time. She appears well-developed and well-nourished.  Blood pressure 130/70  HENT:  Head: Normocephalic.  Right Ear: External ear normal.  Left Ear: External ear normal.  Mouth/Throat: Oropharynx is clear and moist.  Eyes: Conjunctivae and EOM are normal. Pupils are equal, round, and reactive to  light.  Neck: Normal range of motion. Neck supple. No thyromegaly present.  Cardiovascular: Normal rate, regular rhythm, normal heart sounds and intact distal pulses.   Pulmonary/Chest: Effort normal and breath sounds normal.  Abdominal: Soft. Bowel sounds are normal. She exhibits no mass. There is no tenderness.  Musculoskeletal: Normal range of motion. She exhibits edema.  Plus 2 pedal Right greater than left The right distal lower leg without tenderness, but was warm to touch Toes left foot slightly cyanotic and cool  Lymphadenopathy:    She has no cervical adenopathy.  Neurological: She is alert and oriented to person, place, and time.  Skin: Skin is warm and dry. No rash noted.  Psychiatric: She has a normal mood and affect. Her behavior is normal.          Assessment & Plan:   Chronic atrial fibrillation.  We'll continue chronic anticoagulation Hypertension, well-controlled.  Due to pedal edema we'll down titrate amlodipine to 5 mg daily Pedal edema.  Low-salt diet recommended.  Decrease amlodipine, elevate Dyslipidemia.  Continue atorvastatin  Follow-up 3 months  Chalee Hirota Pilar Plate

## 2016-01-07 NOTE — Patient Instructions (Signed)
Limit your sodium (Salt) intake  Elevation of legs as much as possible  Consider compression stockings   decrease amlodipine to 5 mg daily  May take an extra furosemide 20 mg daily if needed  Follow-up 3-4 months

## 2016-01-07 NOTE — Progress Notes (Signed)
Pre visit review using our clinic review tool, if applicable. No additional management support is needed unless otherwise documented below in the visit note. 

## 2016-04-09 ENCOUNTER — Encounter: Payer: Self-pay | Admitting: Vascular Surgery

## 2016-04-22 ENCOUNTER — Ambulatory Visit (INDEPENDENT_AMBULATORY_CARE_PROVIDER_SITE_OTHER): Payer: Commercial Managed Care - HMO | Admitting: Vascular Surgery

## 2016-04-22 ENCOUNTER — Encounter: Payer: Self-pay | Admitting: Vascular Surgery

## 2016-04-22 ENCOUNTER — Ambulatory Visit (HOSPITAL_COMMUNITY)
Admission: RE | Admit: 2016-04-22 | Discharge: 2016-04-22 | Disposition: A | Discharge status: Home / Self Care | Payer: Commercial Managed Care - HMO | Source: Ambulatory Visit | Attending: Vascular Surgery | Admitting: Vascular Surgery

## 2016-04-22 VITALS — BP 140/83 | HR 94 | Temp 98.1°F | Resp 20 | Ht 67.0 in | Wt 185.6 lb

## 2016-04-22 DIAGNOSIS — I10 Essential (primary) hypertension: Secondary | ICD-10-CM | POA: Insufficient documentation

## 2016-04-22 DIAGNOSIS — I739 Peripheral vascular disease, unspecified: Secondary | ICD-10-CM

## 2016-04-22 DIAGNOSIS — E785 Hyperlipidemia, unspecified: Secondary | ICD-10-CM | POA: Insufficient documentation

## 2016-04-22 NOTE — Progress Notes (Signed)
Patient name: Tammy [name] MRN: [id number] DOB: 10/29/1931 Sex: female  REASON FOR VISIT: Follow up of peripheral vascular disease.  HPI: Tammy [name] is a 83 y.o. female who I last saw one year ago on 04/17/2015. She has a history of left calf claudication. She's also had a previous DVT of the left lower extremity and a pulmonary embolus, and has been on Eliquis for this. When I saw her a year ago she had very few symptoms in the left leg. Her activity was fairly limited. I encouraged her to stay as active as possible and set her up for a 1 year follow up visit.  Since I saw her last, she really does not have any significant claudication. She denies any rest pain or nonhealing ulcers. Her main concern today has been swelling in both legs.  Past Medical History:  Diagnosis Date  . BREAST BIOPSY, HX OF 06/10/2006  . Breast cyst   . HIP PAIN, LEFT 01/31/2007  . HYPERLIPIDEMIA 06/10/2006  . HYPERTENSION 06/10/2006  . Impaired glucose tolerance   . NEOPLASMS UNSPEC NATURE BONE SOFT TISSUE&SKIN 05/31/2008  . OSTEOPOROSIS 06/10/2006  . Pulmonary embolus ([facility])     Family History  Problem Relation Age of Onset  . Heart disease Mother     Pacemaker  . Heart disease Father   . Asthma Father   . Diabetes Father   . Cancer Maternal Aunt     breast  . Cancer Sister   . Heart disease Sister   . Hyperlipidemia Sister   . Hypertension Sister     SOCIAL HISTORY: Social History  Substance Use Topics  . Smoking status: Never Smoker  . Smokeless tobacco: Current User    Types: Snuff  . Alcohol use No    No Known Allergies  Current Outpatient Prescriptions  Medication Sig Dispense Refill  . apixaban (ELIQUIS) 5 MG TABS tablet Take 1 tablet (5 mg total) by mouth 2 (two) times daily. 60 tablet 12  . atorvastatin (LIPITOR) 20 MG tablet TAKE 1 TABLET (20 MG TOTAL) BY MOUTH DAILY AT 6 PM. 90 tablet 1  . furosemide (LASIX) 20 MG tablet Take 1 tablet (20 mg total) by mouth daily. 90 tablet 3  .  amLODipine (NORVASC) 5 MG tablet Take 1 tablet (5 mg total) by mouth daily. 90 tablet 3   No current facility-administered medications for this visit.     REVIEW OF SYSTEMS:  [X]  denotes positive finding, [ ]  denotes negative finding Cardiac  Comments:  Chest pain or chest pressure:    Shortness of breath upon exertion:    Short of breath when lying flat:    Irregular heart rhythm:        Vascular    Pain in calf, thigh, or hip brought on by ambulation:    Pain in feet at night that wakes you up from your sleep:     Blood clot in your veins:    Leg swelling:         Pulmonary    Oxygen at home:    Productive cough:     Wheezing:         Neurologic    Sudden weakness in arms or legs:     Sudden numbness in arms or legs:     Sudden onset of difficulty speaking or slurred speech:    Temporary loss of vision in one eye:     Problems with dizziness:  Gastrointestinal    Blood in stool:     Vomited blood:         Genitourinary    Burning when urinating:     Blood in urine:        Psychiatric    Major depression:         Hematologic    Bleeding problems:    Problems with blood clotting too easily:        Skin    Rashes or ulcers:        Constitutional    Fever or chills:      PHYSICAL EXAM: Vitals:   04/22/16 1350 04/22/16 1354  BP: (!) 172/80 140/83  Pulse: 94   Resp: 20   Temp: 98.1 F (36.7 C)   TempSrc: Oral   SpO2: 98%   Weight: 185 lb 9.6 oz (84.2 kg)   Height: 5\' 7"  (1.702 m)     GENERAL: The patient is a well-nourished female, in no acute distress. The vital signs are documented above. CARDIAC: There is a regular rate and rhythm.  VASCULAR: I do not detect carotid bruits. She has palpable femoral pulses. I cannot palpate pedal pulses. She has bilateral lower extremity swelling. PULMONARY: There is good air exchange bilaterally without wheezing or rales. ABDOMEN: Soft and non-tender with normal pitched bowel sounds.  MUSCULOSKELETAL:  There are no major deformities or cyanosis. NEUROLOGIC: No focal weakness or paresthesias are detected. SKIN: She has hyperpigmentation bilaterally consistent with chronic venous insufficiency. PSYCHIATRIC: The patient has a normal affect.  DATA:   BILATERAL LOWER EXTREMITY ARTERIAL DOPPLER STUDY: I have independent interpreted her bilateral lower extremity arterial Doppler study.  On the right side, there is a triphasic posterior tibial signal and a triphasic dorsalis pedis signal. ABI is 100%. Toe pressure is 142 mmHg.  On the left side, there is a monophasic dorsalis pedis and posterior tibial signal. ABI 65%. Toe pressure on the left is 62 mmHg.  MEDICAL ISSUES:  PERIPHERAL VASCULAR DISEASE: This patient has evidence of infrainguinal arterial occlusive disease on the left. Her ABIs have actually improved however. She has minimal symptoms. She is not a smoker. I have encouraged her to stay as active as possible. I've ordered follow up ABIs in 1 year and I'll see her back at that time.  CHRONIC VENOUS INSUFFICIENCY: The patient does have evidence of chronic venous insufficiency bilaterally. We have discussed the importance of intermittent leg elevation in the proper positioning for this. I discussed potentially using compression stockings however she has a very difficult time getting these on. I have encouraged her to exercise his stay as active as possible.    Deitra Mayo [facility] [phone]

## 2016-04-23 NOTE — Addendum Note (Signed)
Addended by: Lianne Cure A on: 04/23/2016 09:46 AM   Modules accepted: Orders

## 2016-05-11 ENCOUNTER — Ambulatory Visit (INDEPENDENT_AMBULATORY_CARE_PROVIDER_SITE_OTHER): Payer: Commercial Managed Care - HMO | Admitting: Internal Medicine

## 2016-05-11 ENCOUNTER — Encounter: Payer: Self-pay | Admitting: Internal Medicine

## 2016-05-11 VITALS — BP 144/70 | HR 93 | Temp 98.2°F | Ht 67.0 in | Wt 186.2 lb

## 2016-05-11 DIAGNOSIS — I4819 Other persistent atrial fibrillation: Secondary | ICD-10-CM

## 2016-05-11 DIAGNOSIS — I10 Essential (primary) hypertension: Secondary | ICD-10-CM | POA: Diagnosis not present

## 2016-05-11 DIAGNOSIS — I481 Persistent atrial fibrillation: Secondary | ICD-10-CM | POA: Diagnosis not present

## 2016-05-11 DIAGNOSIS — R059 Cough, unspecified: Secondary | ICD-10-CM

## 2016-05-11 DIAGNOSIS — R05 Cough: Secondary | ICD-10-CM | POA: Diagnosis not present

## 2016-05-11 NOTE — Patient Instructions (Signed)
Limit your sodium (Salt) intake    It is important that you exercise regularly, at least 20 minutes 3 to 4 times per week.  If you develop chest pain or shortness of breath seek  medical attention.  Call or return to clinic prn if these symptoms worsen or fail to improve as anticipated.  Return in 4 months for follow-up  Please check your blood pressure on a regular basis.  If it is consistently greater than 150/90, please make an office appointment.

## 2016-05-11 NOTE — Progress Notes (Signed)
Subjective:    Patient ID: Star Age, female    DOB: [birth year], 83 y.o.   MRN: [id number]  HPI  83 year old patient who has a history of chronic atrial fibrillation on chronic anticoagulation. She is followed annually by cardiology and also by vascular surgery annually due to PAD She generally is doing well.  For the past few weeks she has had a nonproductive cough.  She also complains of some mild dyspnea on exertion and also when she bends over and stoops.  No PND or orthopnea.  No change in peripheral edema, which has been stable  Past Medical History:  Diagnosis Date  . BREAST BIOPSY, HX OF 06/10/2006  . Breast cyst   . HIP PAIN, LEFT 01/31/2007  . HYPERLIPIDEMIA 06/10/2006  . HYPERTENSION 06/10/2006  . Impaired glucose tolerance   . NEOPLASMS UNSPEC NATURE BONE SOFT TISSUE&SKIN 05/31/2008  . OSTEOPOROSIS 06/10/2006  . Pulmonary embolus [facility])      Social History   Social History  . Marital status: Married    Spouse name: N/A  . Number of children: 4  . Years of education: N/A   Occupational History  . Not on file.   Social History Main Topics  . Smoking status: Never Smoker  . Smokeless tobacco: Current User    Types: Snuff  . Alcohol use No  . Drug use: No  . Sexual activity: Yes   Other Topics Concern  . Not on file   Social History Narrative  . No narrative on file    Past Surgical History:  Procedure Laterality Date  . ABDOMINAL HYSTERECTOMY    . APPENDECTOMY    . BREAST SURGERY     bx x2  . CHOLECYSTECTOMY  1980    Family History  Problem Relation Age of Onset  . Heart disease Mother     Pacemaker  . Heart disease Father   . Asthma Father   . Diabetes Father   . Cancer Maternal Aunt     breast  . Cancer Sister   . Heart disease Sister   . Hyperlipidemia Sister   . Hypertension Sister     No Known Allergies  Current Outpatient Prescriptions on File Prior to Visit  Medication Sig Dispense Refill  . apixaban (ELIQUIS) 5 MG TABS tablet  Take 1 tablet (5 mg total) by mouth 2 (two) times daily. 60 tablet 12  . atorvastatin (LIPITOR) 20 MG tablet TAKE 1 TABLET (20 MG TOTAL) BY MOUTH DAILY AT 6 PM. 90 tablet 1  . furosemide (LASIX) 20 MG tablet Take 1 tablet (20 mg total) by mouth daily. 90 tablet 3  . amLODipine (NORVASC) 5 MG tablet Take 1 tablet (5 mg total) by mouth daily. 90 tablet 3   No current facility-administered medications on file prior to visit.     BP (!) 144/70 (BP Location: Left Arm, Patient Position: Sitting, Cuff Size: Normal)   Pulse 93   Temp 98.2 F (36.8 C) (Oral)   Ht 5\' 7"  (1.702 m)   Wt 186 lb 3.2 oz (84.5 kg)   SpO2 98%   BMI 29.16 kg/m     Review of Systems  Constitutional: Negative.   HENT: Negative for congestion, dental problem, hearing loss, rhinorrhea, sinus pressure, sore throat and tinnitus.   Eyes: Negative for pain, discharge and visual disturbance.  Respiratory: Positive for shortness of breath. Negative for cough.   Cardiovascular: Positive for leg swelling. Negative for chest pain and palpitations.  Gastrointestinal: Negative for abdominal  distention, abdominal pain, blood in stool, constipation, diarrhea, nausea and vomiting.  Genitourinary: Negative for difficulty urinating, dysuria, flank pain, frequency, hematuria, pelvic pain, urgency, vaginal bleeding, vaginal discharge and vaginal pain.  Musculoskeletal: Negative for arthralgias, gait problem and joint swelling.  Skin: Negative for rash.  Neurological: Negative for dizziness, syncope, speech difficulty, weakness, numbness and headaches.  Hematological: Negative for adenopathy.  Psychiatric/Behavioral: Negative for agitation, behavioral problems and dysphoric mood. The patient is not nervous/anxious.        Objective:   Physical Exam  Constitutional: She is oriented to person, place, and time. She appears well-developed and well-nourished.  HENT:  Head: Normocephalic.  Right Ear: External ear normal.  Left Ear:  External ear normal.  Mouth/Throat: Oropharynx is clear and moist.  Eyes: Conjunctivae and EOM are normal. Pupils are equal, round, and reactive to light.  Neck: Normal range of motion. Neck supple. No thyromegaly present.  Cardiovascular: Normal rate, normal heart sounds and intact distal pulses.   Controlled ventricular response  Pulmonary/Chest: Effort normal and breath sounds normal.  Possible slight decreased breath sounds left base  Abdominal: Soft. Bowel sounds are normal. She exhibits no mass. There is no tenderness.  Musculoskeletal: Normal range of motion.  Lymphadenopathy:    She has no cervical adenopathy.  Neurological: She is alert and oriented to person, place, and time.  Skin: Skin is warm and dry. No rash noted.  Psychiatric: She has a normal mood and affect. Her behavior is normal.          Assessment & Plan:   Chronic atrial fibrillation.  Continue chronic anticoagulation Cough/dyspnea on exertion.  Symptoms seem very minor.  Exam is noncontributory.  Will observe at this time.  We'll evaluate if persistent or any clinical worsening.  Normal LV function in November 2017.  Patient does have a history of TR and pulmonary hypertension noted on 2-D echo  Peripheral edema, stable Dyslipidemia.  Continue atorvastatin  Follow-up 4 months    It is important that you exercise regularly, at least 20 minutes 3 to 4 times per week.  If you develop chest pain or shortness of breath seek  medical attention.  Tammy [name]

## 2016-05-11 NOTE — Progress Notes (Signed)
Pre visit review using our clinic review tool, if applicable. No additional management support is needed unless otherwise documented below in the visit note. 

## 2016-07-07 ENCOUNTER — Other Ambulatory Visit: Payer: Self-pay | Admitting: Internal Medicine

## 2016-07-22 ENCOUNTER — Other Ambulatory Visit: Payer: Self-pay | Admitting: Internal Medicine

## 2016-09-07 ENCOUNTER — Encounter: Payer: Self-pay | Admitting: Internal Medicine

## 2016-09-07 ENCOUNTER — Ambulatory Visit (INDEPENDENT_AMBULATORY_CARE_PROVIDER_SITE_OTHER): Payer: Medicare HMO | Admitting: Internal Medicine

## 2016-09-07 VITALS — BP 130/76 | HR 95 | Temp 98.0°F | Ht 67.0 in | Wt 185.2 lb

## 2016-09-07 DIAGNOSIS — I480 Paroxysmal atrial fibrillation: Secondary | ICD-10-CM

## 2016-09-07 DIAGNOSIS — I739 Peripheral vascular disease, unspecified: Secondary | ICD-10-CM | POA: Diagnosis not present

## 2016-09-07 DIAGNOSIS — I272 Pulmonary hypertension, unspecified: Secondary | ICD-10-CM | POA: Diagnosis not present

## 2016-09-07 DIAGNOSIS — I10 Essential (primary) hypertension: Secondary | ICD-10-CM | POA: Diagnosis not present

## 2016-09-07 DIAGNOSIS — E785 Hyperlipidemia, unspecified: Secondary | ICD-10-CM

## 2016-09-07 NOTE — Patient Instructions (Signed)
Limit your sodium (Salt) intake  Cardiology follow-up as scheduled

## 2016-09-07 NOTE — Progress Notes (Signed)
   Subjective:    Patient ID: Tammy [name], female    DOB: 12/04/1931, 83 y.o.   MRN: [id number]  HPI    Review of Systems     Objective:   Physical Exam        Assessment & Plan:

## 2016-09-07 NOTE — Progress Notes (Signed)
Subjective:    Patient ID: Tammy [name] Age, female    DOB: 10-10-1931, 83 y.o.   MRN: [id number]  HPI 83 year old patient who is seen today in follow-up.  She has a history of essential hypertension. Complaints today include some weakness and nonspecific dizziness.  She states that she tires easily and has some exercise intolerance She complains of some shortness of breath when she sings in church or bends or stoops over She does have a history of a prior allergic embolism.  2-D echocardiogram in November of last year reviewed and suggested improvement of pulmonary hypertension and tricuspid regurgitation. She has been contacted by cardiology and plans a follow-up. She has atrial fibrillation and history of PAD.  More recent ABIs were improved, remains normal on the right; right ABI improved from point 47 to .65. Patient has atrial fibrillation.  Remains on chronic anticoagulation  No claudication  . Past Medical History:  Diagnosis Date  . BREAST BIOPSY, HX OF 06/10/2006  . Breast cyst   . HIP PAIN, LEFT 01/31/2007  . HYPERLIPIDEMIA 06/10/2006  . HYPERTENSION 06/10/2006  . Impaired glucose tolerance   . NEOPLASMS UNSPEC NATURE BONE SOFT TISSUE&SKIN 05/31/2008  . OSTEOPOROSIS 06/10/2006  . Pulmonary embolus [facility]      Social History   Social History  . Marital status: Married    Spouse name: N/A  . Number of children: 4  . Years of education: N/A   Occupational History  . Not on file.   Social History Main Topics  . Smoking status: Never Smoker  . Smokeless tobacco: Current User    Types: Snuff  . Alcohol use No  . Drug use: No  . Sexual activity: Yes   Other Topics Concern  . Not on file   Social History Narrative  . No narrative on file    Past Surgical History:  Procedure Laterality Date  . ABDOMINAL HYSTERECTOMY    . APPENDECTOMY    . BREAST SURGERY     bx x2  . CHOLECYSTECTOMY  1980    Family History  Problem Relation Age of Onset  . Heart disease Mother          Pacemaker  . Heart disease Father   . Asthma Father   . Diabetes Father   . Cancer Maternal Aunt        breast  . Cancer Sister   . Heart disease Sister   . Hyperlipidemia Sister   . Hypertension Sister     No Known Allergies  Current Outpatient Prescriptions on File Prior to Visit  Medication Sig Dispense Refill  . atorvastatin (LIPITOR) 20 MG tablet TAKE 1 TABLET (20 MG TOTAL) BY MOUTH DAILY AT 6 PM. 90 tablet 1  . ELIQUIS 5 MG TABS tablet TAKE 1 TABLET (5 MG TOTAL) BY MOUTH 2 (TWO) TIMES DAILY. 60 tablet 9  . furosemide (LASIX) 20 MG tablet TAKE 1 TABLET (20 MG TOTAL) BY MOUTH DAILY. 90 tablet 3  . amLODipine (NORVASC) 5 MG tablet Take 1 tablet (5 mg total) by mouth daily. 90 tablet 3   No current facility-administered medications on file prior to visit.     BP 130/76 (BP Location: Left Arm, Patient Position: Sitting, Cuff Size: Normal)   Pulse 95   Temp 98 F (36.7 C) (Oral)   Ht 5\' 7"  (1.702 m)   Wt 185 lb 3.2 oz (84 kg)   SpO2 96%   BMI 29.01 kg/m     Review of Systems  Constitutional: Positive for activity change and fatigue.  HENT: Negative for congestion, dental problem, hearing loss, rhinorrhea, sinus pressure, sore throat and tinnitus.   Eyes: Negative for pain, discharge and visual disturbance.  Respiratory: Positive for shortness of breath. Negative for cough.   Cardiovascular: Negative for chest pain, palpitations and leg swelling.  Gastrointestinal: Negative for abdominal distention, abdominal pain, blood in stool, constipation, diarrhea, nausea and vomiting.  Genitourinary: Negative for difficulty urinating, dysuria, flank pain, frequency, hematuria, pelvic pain, urgency, vaginal bleeding, vaginal discharge and vaginal pain.  Musculoskeletal: Negative for arthralgias, gait problem and joint swelling.  Skin: Negative for rash.  Neurological: Positive for dizziness and weakness. Negative for syncope, speech difficulty, numbness and headaches.   Hematological: Negative for adenopathy.  Psychiatric/Behavioral: Negative for agitation, behavioral problems and dysphoric mood. The patient is not nervous/anxious.        Objective:   Physical Exam  Constitutional: She is oriented to person, place, and time. She appears well-developed and well-nourished.  Blood pressure 110/70  HENT:  Head: Normocephalic.  Right Ear: External ear normal.  Left Ear: External ear normal.  Mouth/Throat: Oropharynx is clear and moist.  Eyes: Pupils are equal, round, and reactive to light. Conjunctivae and EOM are normal.  Neck: Normal range of motion. Neck supple. No thyromegaly present.  Cardiovascular: Normal rate and normal heart sounds.   Right dorsalis pedis pulses full.  The peripheral pulses not easily palpable Rate is irregular  Pulmonary/Chest: Effort normal and breath sounds normal.  Rare scattered rhonchi O2 saturation 96%  Abdominal: Soft. Bowel sounds are normal. She exhibits no mass. There is no tenderness.  Musculoskeletal: Normal range of motion. She exhibits edema.  Lymphadenopathy:    She has no cervical adenopathy.  Neurological: She is alert and oriented to person, place, and time.  Skin: Skin is warm and dry. No rash noted.  Psychiatric: She has a normal mood and affect. Her behavior is normal.          Assessment & Plan:   Atrial fibrillation.  Continue anticoagulation Essential hypertension, well-controlled PAD.  Indices slightly improved.  Remains asymptomatic Status post pulmonary embolism.  Continue chronic anticoagulation  Cardiology follow-up as scheduled Repeat ABI 1 year Follow-up here 6 months or as needed  [name]

## 2016-09-21 DIAGNOSIS — H524 Presbyopia: Secondary | ICD-10-CM | POA: Diagnosis not present

## 2016-09-21 DIAGNOSIS — H52209 Unspecified astigmatism, unspecified eye: Secondary | ICD-10-CM | POA: Diagnosis not present

## 2016-09-21 DIAGNOSIS — H5203 Hypermetropia, bilateral: Secondary | ICD-10-CM | POA: Diagnosis not present

## 2016-09-21 DIAGNOSIS — H5213 Myopia, bilateral: Secondary | ICD-10-CM | POA: Diagnosis not present

## 2016-10-30 ENCOUNTER — Other Ambulatory Visit: Payer: Self-pay | Admitting: Internal Medicine

## 2016-11-11 ENCOUNTER — Encounter: Payer: Self-pay | Admitting: Cardiology

## 2016-11-16 NOTE — Progress Notes (Signed)
HPI: FU syncope. Patient with history of syncope felt secondary to autonomic dysfunction. Previously loop monitor considered; Dr Lovena Le then felt it would be indicated if recurrent syncope. H/O pulmonary embolus; seen by pulmonary and chronic anticoagulation felt indicated. Last echocardiogram November 2017 showed normal LV function, mild left ventricular hypertrophy, grade 1 diastolic dysfunction, mild left atrial enlargement, moderate right atrial/right ventricular enlargement and mild to moderate tricuspid regurgitation. Peripheral vascular disease followed by Dr. Scot Dock. Since last seen, she notes some dyspnea on exertion but this is unchanged compared to one year ago. Occasional weakness also unchanged. She denies orthopnea, PND, chest pain or palpitations or recurrent syncope.  Current Outpatient Prescriptions  Medication Sig Dispense Refill  . atorvastatin (LIPITOR) 20 MG tablet TAKE 1 TABLET BY MOUTH DAILY AT 6 PM. 90 tablet 0  . ELIQUIS 5 MG TABS tablet TAKE 1 TABLET (5 MG TOTAL) BY MOUTH 2 (TWO) TIMES DAILY. 60 tablet 9  . furosemide (LASIX) 20 MG tablet TAKE 1 TABLET (20 MG TOTAL) BY MOUTH DAILY. 90 tablet 3  . amLODipine (NORVASC) 5 MG tablet Take 1 tablet (5 mg total) by mouth daily. 90 tablet 3   No current facility-administered medications for this visit.      Past Medical History:  Diagnosis Date  . BREAST BIOPSY, HX OF 06/10/2006  . Breast cyst   . HIP PAIN, LEFT 01/31/2007  . HYPERLIPIDEMIA 06/10/2006  . HYPERTENSION 06/10/2006  . Impaired glucose tolerance   . NEOPLASMS UNSPEC NATURE BONE SOFT TISSUE&SKIN 05/31/2008  . OSTEOPOROSIS 06/10/2006  . Pulmonary embolus [facility])     Past Surgical History:  Procedure Laterality Date  . ABDOMINAL HYSTERECTOMY    . APPENDECTOMY    . BREAST SURGERY     bx x2  . CHOLECYSTECTOMY  1980    Social History   Social History  . Marital status: Married    Spouse name: N/A  . Number of children: 4  . Years of education: N/A    Occupational History  . Not on file.   Social History Main Topics  . Smoking status: Never Smoker  . Smokeless tobacco: Current User    Types: Snuff  . Alcohol use No  . Drug use: No  . Sexual activity: Yes   Other Topics Concern  . Not on file   Social History Narrative  . No narrative on file    Family History  Problem Relation Age of Onset  . Heart disease Mother        Pacemaker  . Heart disease Father   . Asthma Father   . Diabetes Father   . Cancer Maternal Aunt        breast  . Cancer Sister   . Heart disease Sister   . Hyperlipidemia Sister   . Hypertension Sister     ROS: Chronic pedal edema unchanged. No fevers or chills, productive cough, hemoptysis, dysphasia, odynophagia, melena, hematochezia, dysuria, hematuria, rash, seizure activity, orthopnea, PND, pedal edema, claudication. Remaining systems are negative.  Physical Exam: Well-developed well-nourished in no acute distress.  Skin is warm and dry.  HEENT is normal.  Neck is supple.  Chest is clear to auscultation with normal expansion.  Cardiovascular exam is irregular Abdominal exam nontender or distended. No masses palpated. Extremities show trace edema; varicosities noted . neuro grossly intact  ECG- Atrial fibrillation at a rate of 90. RV conduction delay. Nonspecific ST changes. personally reviewed  A/P  1 Syncope-no recurrent episodes. No further evaluation at  this point.  2 hypertension-blood pressure is controlled. Continue present medications.  3 hyperlipidemia-continue statin. Check lipids and liver.   4 history of pulmonary embolus-we have elected long-term anticoagulation per pulmonary. Continue apixaban. Check hemoglobin and renal function.  5 tricuspid regurgitation-moderate almost recent echocardiogram.  6 peripheral vascular disease-continue statin. Followed by vascular surgery.  7 new-onset atrial fibrillation-Patient is in new onset atrial fibrillation today. However I  do not believe that her symptoms are changed compared to one year ago. We will reviewed options of rhythm versus rate control. Given that she is relatively asymptomatic I would favor rate control and long-term anticoagulation and she agrees. We will see her back in 3 months to reassess her symptoms. Plan repeat echocardiogram. Check TSH. Check 24-hour Holter monitor to make sure that rate is controlled. Can consider cardioversion if she develops symptoms in the future.   Kirk Ruths, MD

## 2016-11-25 ENCOUNTER — Ambulatory Visit (INDEPENDENT_AMBULATORY_CARE_PROVIDER_SITE_OTHER): Payer: Medicare HMO | Admitting: Cardiology

## 2016-11-25 ENCOUNTER — Encounter: Payer: Self-pay | Admitting: Cardiology

## 2016-11-25 VITALS — BP 126/80 | HR 90 | Ht 67.0 in | Wt 186.0 lb

## 2016-11-25 DIAGNOSIS — R55 Syncope and collapse: Secondary | ICD-10-CM | POA: Diagnosis not present

## 2016-11-25 DIAGNOSIS — I481 Persistent atrial fibrillation: Secondary | ICD-10-CM

## 2016-11-25 DIAGNOSIS — I10 Essential (primary) hypertension: Secondary | ICD-10-CM

## 2016-11-25 DIAGNOSIS — I4819 Other persistent atrial fibrillation: Secondary | ICD-10-CM

## 2016-11-25 DIAGNOSIS — E7800 Pure hypercholesterolemia, unspecified: Secondary | ICD-10-CM

## 2016-11-25 NOTE — Patient Instructions (Signed)
Medication Instructions:   NO CHANGE  Labwork:  Your physician recommends that you HAVE LAB WORK TODAY  Testing/Procedures:  Your physician has requested that you have an echocardiogram. Echocardiography is a painless test that uses sound waves to create images of your heart. It provides your doctor with information about the size and shape of your heart and how well your heart's chambers and valves are working. This procedure takes approximately one hour. There are no restrictions for this procedure.   Your physician has recommended that you wear a 24 HOUR holter monitor. Holter monitors are medical devices that record the heart's electrical activity. Doctors most often use these monitors to diagnose arrhythmias. Arrhythmias are problems with the speed or rhythm of the heartbeat. The monitor is a small, portable device. You can wear one while you do your normal daily activities. This is usually used to diagnose what is causing palpitations/syncope (passing out).    Follow-Up:  Your physician recommends that you schedule a follow-up appointment in: [place]   If you need a refill on your cardiac medications before your next appointment, please call your pharmacy. Marland Kitchen

## 2016-11-26 ENCOUNTER — Encounter: Payer: Self-pay | Admitting: *Deleted

## 2016-11-26 LAB — BASIC METABOLIC PANEL
BUN / CREAT RATIO: 19 (ref 12–28)
BUN: 24 mg/dL (ref 8–27)
CO2: 23 mmol/L (ref 20–29)
Calcium: 10 mg/dL (ref 8.7–10.3)
Chloride: 101 mmol/L (ref 96–106)
Creatinine, Ser: 1.26 mg/dL — ABNORMAL HIGH (ref 0.57–1.00)
GFR calc Af Amer: 46 mL/min/{1.73_m2} — ABNORMAL LOW (ref >59)
GFR calc non Af Amer: 39 mL/min/{1.73_m2} — ABNORMAL LOW (ref >59)
GLUCOSE: 121 mg/dL — AB (ref 65–99)
POTASSIUM: 4.6 mmol/L (ref 3.5–5.2)
Sodium: 143 mmol/L (ref 134–144)

## 2016-11-26 LAB — LIPID PANEL
CHOLESTEROL TOTAL: 135 mg/dL (ref 100–199)
Chol/HDL Ratio: 2.1 ratio (ref 0.0–4.4)
HDL: 65 mg/dL (ref >39)
LDL CALC: 48 mg/dL (ref 0–99)
Triglycerides: 110 mg/dL (ref 0–149)
VLDL CHOLESTEROL CAL: 22 mg/dL (ref 5–40)

## 2016-11-26 LAB — CBC
Hematocrit: 43.8 % (ref 34.0–46.6)
Hemoglobin: 15 g/dL (ref 11.1–15.9)
MCH: 31.4 pg (ref 26.6–33.0)
MCHC: 34.2 g/dL (ref 31.5–35.7)
MCV: 92 fL (ref 79–97)
Platelets: 276 10*3/uL (ref 150–379)
RBC: 4.77 x10E6/uL (ref 3.77–5.28)
RDW: 14.7 % (ref 12.3–15.4)
WBC: 7 10*3/uL (ref 3.4–10.8)

## 2016-11-26 LAB — HEPATIC FUNCTION PANEL
ALBUMIN: 4.7 g/dL (ref 3.5–4.7)
ALT: 16 IU/L (ref 0–32)
AST: 21 IU/L (ref 0–40)
Alkaline Phosphatase: 84 IU/L (ref 39–117)
BILIRUBIN TOTAL: 0.9 mg/dL (ref 0.0–1.2)
Bilirubin, Direct: 0.27 mg/dL (ref 0.00–0.40)
TOTAL PROTEIN: 7.3 g/dL (ref 6.0–8.5)

## 2016-11-26 LAB — TSH: TSH: 5.38 u[IU]/mL — AB (ref 0.450–4.500)

## 2016-11-26 NOTE — Telephone Encounter (Signed)
This encounter was created in error - please disregard.

## 2016-11-26 NOTE — Telephone Encounter (Addendum)
-----   Message from Lelon Perla, MD sent at 11/26/2016  7:46 AM EDT ----- FU primary care for elevated TSH Kirk Ruths   Spoke with pt, aware of lab resu

## 2016-12-02 ENCOUNTER — Ambulatory Visit (HOSPITAL_COMMUNITY): Payer: Medicare HMO | Attending: Cardiovascular Disease

## 2016-12-02 ENCOUNTER — Other Ambulatory Visit: Payer: Self-pay

## 2016-12-02 DIAGNOSIS — I371 Nonrheumatic pulmonary valve insufficiency: Secondary | ICD-10-CM | POA: Diagnosis not present

## 2016-12-02 DIAGNOSIS — R55 Syncope and collapse: Secondary | ICD-10-CM | POA: Insufficient documentation

## 2016-12-02 DIAGNOSIS — I10 Essential (primary) hypertension: Secondary | ICD-10-CM | POA: Diagnosis not present

## 2016-12-02 DIAGNOSIS — I082 Rheumatic disorders of both aortic and tricuspid valves: Secondary | ICD-10-CM | POA: Diagnosis not present

## 2016-12-02 DIAGNOSIS — M810 Age-related osteoporosis without current pathological fracture: Secondary | ICD-10-CM | POA: Insufficient documentation

## 2016-12-02 DIAGNOSIS — E785 Hyperlipidemia, unspecified: Secondary | ICD-10-CM | POA: Insufficient documentation

## 2016-12-02 DIAGNOSIS — I481 Persistent atrial fibrillation: Secondary | ICD-10-CM | POA: Insufficient documentation

## 2016-12-02 DIAGNOSIS — I4819 Other persistent atrial fibrillation: Secondary | ICD-10-CM

## 2016-12-03 ENCOUNTER — Ambulatory Visit (INDEPENDENT_AMBULATORY_CARE_PROVIDER_SITE_OTHER): Payer: Medicare HMO

## 2016-12-03 DIAGNOSIS — I481 Persistent atrial fibrillation: Secondary | ICD-10-CM

## 2016-12-03 DIAGNOSIS — I4819 Other persistent atrial fibrillation: Secondary | ICD-10-CM

## 2016-12-10 ENCOUNTER — Ambulatory Visit: Payer: Medicare HMO | Admitting: Internal Medicine

## 2016-12-28 ENCOUNTER — Other Ambulatory Visit: Payer: Self-pay | Admitting: Internal Medicine

## 2017-01-25 ENCOUNTER — Other Ambulatory Visit: Payer: Self-pay | Admitting: Internal Medicine

## 2017-02-18 NOTE — H&P (View-Only) (Signed)
HPI: FU syncope. Patient with history of syncope felt secondary to autonomic dysfunction. Previously loop monitor considered; Dr Lovena Le felt it would be indicated if recurrent syncope. H/O pulmonary embolus; seen by pulmonary and chronic anticoagulation felt indicated. Peripheral vascular disease followed by Dr. Scot Dock.   Echocardiogram November 2018 showed normal LV function, biatrial enlargement and moderate tricuspid regurgitation.  Holter monitor November 2018 showed atrial fibrillation with PVCs or aberrantly conducted beats rate controlled.  Patient noted to be in atrial fibrillation at last office visit and plan was rate control and anticoagulation.  Since last seen,  patient does have some dyspnea on exertion but no orthopnea, PND.  Occasional brief nonexertional pain in right upper chest for 2 minutes but no exertional chest pain.  Increased fatigue.  Chronic pedal edema.  Current Outpatient Medications  Medication Sig Dispense Refill  . amLODipine (NORVASC) 5 MG tablet TAKE 1 TABLET (5 MG TOTAL) BY MOUTH DAILY. 90 tablet 3  . atorvastatin (LIPITOR) 20 MG tablet TAKE 1 TABLET BY MOUTH DAILY AT 6 PM. 90 tablet 0  . ELIQUIS 5 MG TABS tablet TAKE 1 TABLET (5 MG TOTAL) BY MOUTH 2 (TWO) TIMES DAILY. 60 tablet 9  . furosemide (LASIX) 20 MG tablet TAKE 1 TABLET (20 MG TOTAL) BY MOUTH DAILY. 90 tablet 3   No current facility-administered medications for this visit.      Past Medical History:  Diagnosis Date  . BREAST BIOPSY, HX OF 06/10/2006  . Breast cyst   . HIP PAIN, LEFT 01/31/2007  . HYPERLIPIDEMIA 06/10/2006  . HYPERTENSION 06/10/2006  . Impaired glucose tolerance   . NEOPLASMS UNSPEC NATURE BONE SOFT TISSUE&SKIN 05/31/2008  . OSTEOPOROSIS 06/10/2006  . Pulmonary embolus [facility])     Past Surgical History:  Procedure Laterality Date  . ABDOMINAL HYSTERECTOMY    . APPENDECTOMY    . BREAST SURGERY     bx x2  . CHOLECYSTECTOMY  1980    Social History   Socioeconomic History    . Marital status: Married    Spouse name: Not on file  . Number of children: 4  . Years of education: Not on file  . Highest education level: Not on file  Social Needs  . Financial resource strain: Not on file  . Food insecurity - worry: Not on file  . Food insecurity - inability: Not on file  . Transportation needs - medical: Not on file  . Transportation needs - non-medical: Not on file  Occupational History  . Not on file  Tobacco Use  . Smoking status: Never Smoker  . Smokeless tobacco: Current User    Types: Snuff  Substance and Sexual Activity  . Alcohol use: No    Alcohol/week: 0.0 oz  . Drug use: No  . Sexual activity: Yes  Other Topics Concern  . Not on file  Social History Narrative  . Not on file    Family History  Problem Relation Age of Onset  . Heart disease Mother        Pacemaker  . Heart disease Father   . Asthma Father   . Diabetes Father   . Cancer Maternal Aunt        breast  . Cancer Sister   . Heart disease Sister   . Hyperlipidemia Sister   . Hypertension Sister     ROS: no fevers or chills, productive cough, hemoptysis, dysphasia, odynophagia, melena, hematochezia, dysuria, hematuria, rash, seizure activity, orthopnea, PND,  claudication. Remaining systems are  negative.  Physical Exam: Well-developed well-nourished in no acute distress.  Skin is warm and dry.  HEENT is normal.  Neck is supple.  Chest is clear to auscultation with normal expansion.  Cardiovascular exam is irregular Abdominal exam nontender or distended. No masses palpated. Extremities show trace edema. neuro grossly intact  ECG-atrial fibrillation with PVCs or aberrantly conducted beats.  Right bundle branch block.  Personally reviewed  A/P  1 syncope-patient is doing well with no recurrent episodes.  We will not pursue further evaluation.  2 hypertension-blood pressure elevated.  Continue present medications and folllow.  3 atrial fibrillation-patient remains  in atrial fibrillation today.  She appears to be symptomatic with increased dyspnea on exertion and fatigue.  I think we should try and reestablish sinus rhythm.  I will add amiodarone 200 mg twice daily for 1 week and then 200 mg daily thereafter.  Continue apixaban.  Check hemoglobin and renal function.  Schedule cardioversion in 2 weeks.  4 hyperlipidemia-continue statin.  5 history of pulmonary embolus-long-term anticoagulation per pulmonary.  Continue apixaban.  6 peripheral vascular disease-followed by vascular surgery.  Continue statin.  Not on aspirin given need for anticoagulation.  7 tricuspid regurgitation-moderate on most recent echo.  Kirk Ruths, MD

## 2017-02-18 NOTE — Progress Notes (Signed)
HPI: FU syncope. Patient with history of syncope felt secondary to autonomic dysfunction. Previously loop monitor considered; Dr Lovena Le felt it would be indicated if recurrent syncope. H/O pulmonary embolus; seen by pulmonary and chronic anticoagulation felt indicated. Peripheral vascular disease followed by Dr. Scot Dock.   Echocardiogram November 2018 showed normal LV function, biatrial enlargement and moderate tricuspid regurgitation.  Holter monitor November 2018 showed atrial fibrillation with PVCs or aberrantly conducted beats rate controlled.  Patient noted to be in atrial fibrillation at last office visit and plan was rate control and anticoagulation.  Since last seen,  patient does have some dyspnea on exertion but no orthopnea, PND.  Occasional brief nonexertional pain in right upper chest for 2 minutes but no exertional chest pain.  Increased fatigue.  Chronic pedal edema.  Current Outpatient Medications  Medication Sig Dispense Refill  . amLODipine (NORVASC) 5 MG tablet TAKE 1 TABLET (5 MG TOTAL) BY MOUTH DAILY. 90 tablet 3  . atorvastatin (LIPITOR) 20 MG tablet TAKE 1 TABLET BY MOUTH DAILY AT 6 PM. 90 tablet 0  . ELIQUIS 5 MG TABS tablet TAKE 1 TABLET (5 MG TOTAL) BY MOUTH 2 (TWO) TIMES DAILY. 60 tablet 9  . furosemide (LASIX) 20 MG tablet TAKE 1 TABLET (20 MG TOTAL) BY MOUTH DAILY. 90 tablet 3   No current facility-administered medications for this visit.      Past Medical History:  Diagnosis Date  . BREAST BIOPSY, HX OF 06/10/2006  . Breast cyst   . HIP PAIN, LEFT 01/31/2007  . HYPERLIPIDEMIA 06/10/2006  . HYPERTENSION 06/10/2006  . Impaired glucose tolerance   . NEOPLASMS UNSPEC NATURE BONE SOFT TISSUE&SKIN 05/31/2008  . OSTEOPOROSIS 06/10/2006  . Pulmonary embolus [facility])     Past Surgical History:  Procedure Laterality Date  . ABDOMINAL HYSTERECTOMY    . APPENDECTOMY    . BREAST SURGERY     bx x2  . CHOLECYSTECTOMY  1980    Social History   Socioeconomic History    . Marital status: Married    Spouse name: Not on file  . Number of children: 4  . Years of education: Not on file  . Highest education level: Not on file  Social Needs  . Financial resource strain: Not on file  . Food insecurity - worry: Not on file  . Food insecurity - inability: Not on file  . Transportation needs - medical: Not on file  . Transportation needs - non-medical: Not on file  Occupational History  . Not on file  Tobacco Use  . Smoking status: Never Smoker  . Smokeless tobacco: Current User    Types: Snuff  Substance and Sexual Activity  . Alcohol use: No    Alcohol/week: 0.0 oz  . Drug use: No  . Sexual activity: Yes  Other Topics Concern  . Not on file  Social History Narrative  . Not on file    Family History  Problem Relation Age of Onset  . Heart disease Mother        Pacemaker  . Heart disease Father   . Asthma Father   . Diabetes Father   . Cancer Maternal Aunt        breast  . Cancer Sister   . Heart disease Sister   . Hyperlipidemia Sister   . Hypertension Sister     ROS: no fevers or chills, productive cough, hemoptysis, dysphasia, odynophagia, melena, hematochezia, dysuria, hematuria, rash, seizure activity, orthopnea, PND,  claudication. Remaining systems are  negative.  Physical Exam: Well-developed well-nourished in no acute distress.  Skin is warm and dry.  HEENT is normal.  Neck is supple.  Chest is clear to auscultation with normal expansion.  Cardiovascular exam is irregular Abdominal exam nontender or distended. No masses palpated. Extremities show trace edema. neuro grossly intact  ECG-atrial fibrillation with PVCs or aberrantly conducted beats.  Right bundle branch block.  Personally reviewed  A/P  1 syncope-patient is doing well with no recurrent episodes.  We will not pursue further evaluation.  2 hypertension-blood pressure elevated.  Continue present medications and folllow.  3 atrial fibrillation-patient remains  in atrial fibrillation today.  She appears to be symptomatic with increased dyspnea on exertion and fatigue.  I think we should try and reestablish sinus rhythm.  I will add amiodarone 200 mg twice daily for 1 week and then 200 mg daily thereafter.  Continue apixaban.  Check hemoglobin and renal function.  Schedule cardioversion in 2 weeks.  4 hyperlipidemia-continue statin.  5 history of pulmonary embolus-long-term anticoagulation per pulmonary.  Continue apixaban.  6 peripheral vascular disease-followed by vascular surgery.  Continue statin.  Not on aspirin given need for anticoagulation.  7 tricuspid regurgitation-moderate on most recent echo.  Kirk Ruths, MD

## 2017-03-01 ENCOUNTER — Encounter (INDEPENDENT_AMBULATORY_CARE_PROVIDER_SITE_OTHER): Payer: Self-pay

## 2017-03-01 ENCOUNTER — Encounter: Payer: Self-pay | Admitting: Cardiology

## 2017-03-01 ENCOUNTER — Ambulatory Visit: Payer: Medicare HMO | Admitting: Cardiology

## 2017-03-01 VITALS — BP 177/95 | HR 98 | Ht 67.0 in | Wt 187.0 lb

## 2017-03-01 DIAGNOSIS — I10 Essential (primary) hypertension: Secondary | ICD-10-CM | POA: Diagnosis not present

## 2017-03-01 DIAGNOSIS — E7800 Pure hypercholesterolemia, unspecified: Secondary | ICD-10-CM

## 2017-03-01 DIAGNOSIS — I481 Persistent atrial fibrillation: Secondary | ICD-10-CM | POA: Diagnosis not present

## 2017-03-01 DIAGNOSIS — I4819 Other persistent atrial fibrillation: Secondary | ICD-10-CM

## 2017-03-01 LAB — BASIC METABOLIC PANEL
BUN / CREAT RATIO: 21 (ref 12–28)
BUN: 23 mg/dL (ref 8–27)
CHLORIDE: 104 mmol/L (ref 96–106)
CO2: 21 mmol/L (ref 20–29)
Calcium: 10 mg/dL (ref 8.7–10.3)
Creatinine, Ser: 1.07 mg/dL — ABNORMAL HIGH (ref 0.57–1.00)
GFR calc Af Amer: 55 mL/min/{1.73_m2} — ABNORMAL LOW (ref >59)
GFR calc non Af Amer: 48 mL/min/{1.73_m2} — ABNORMAL LOW (ref >59)
GLUCOSE: 132 mg/dL — AB (ref 65–99)
POTASSIUM: 4.8 mmol/L (ref 3.5–5.2)
SODIUM: 143 mmol/L (ref 134–144)

## 2017-03-01 LAB — CBC
Hematocrit: 43.5 % (ref 34.0–46.6)
Hemoglobin: 14.5 g/dL (ref 11.1–15.9)
MCH: 30.5 pg (ref 26.6–33.0)
MCHC: 33.3 g/dL (ref 31.5–35.7)
MCV: 92 fL (ref 79–97)
PLATELETS: 276 10*3/uL (ref 150–379)
RBC: 4.75 x10E6/uL (ref 3.77–5.28)
RDW: 15 % (ref 12.3–15.4)
WBC: 6.5 10*3/uL (ref 3.4–10.8)

## 2017-03-01 MED ORDER — AMIODARONE HCL 200 MG PO TABS: ORAL_TABLET | ORAL | 12 refills | Status: DC

## 2017-03-01 NOTE — Patient Instructions (Signed)
Medication Instructions:   START AMIODARONE 200 MG TAKE 1 TABLET TWICE DAILY X ONE WEEK THEN DECREASE TO 1 TABLET ONCE DAILY  Labwork:  Your physician recommends that you HAVE LAB WORK TODAY  Testing/Procedures:  Your physician has recommended that you have a Cardioversion (DCCV). Electrical Cardioversion uses a jolt of electricity to your heart either through paddles or wired patches attached to your chest. This is a controlled, usually prescheduled, procedure. Defibrillation is done under light anesthesia in the hospital, and you usually go home the day of the procedure. This is done to get your heart back into a normal rhythm. You are not awake for the procedure. Please see the instruction sheet given to you today.    Follow-Up:  Your physician recommends that you schedule a follow-up appointment in: [place] are scheduled for a Cardioversion  on Thursday 03-11-17 with Dr. Meda Coffee.  Please arrive at the [facility] (Main Entrance A) at [facility]: [address] [place], [place] [address] at 10:30 AM TO 11 AM    DIET: Nothing to eat or drink after midnight except a sip of water with medications (see medication instructions below)  Medication Instructions: Hold FUROSEMIDE THE MORNING OF THE PROCEDURE  Continue your anticoagulant: ELIQUIS You will need to continue your anticoagulant after your procedure until you  are told by your  Provider that it is safe to stop  You must have a responsible person to drive you home and stay in the waiting area during your procedure. Failure to do so could result in cancellation.  Bring your insurance cards.  *Special Note: Every effort is made to have your procedure done on time. Occasionally there are emergencies that occur at the hospital that may cause delays. Please be patient if a delay does occur.

## 2017-03-02 ENCOUNTER — Encounter: Payer: Self-pay | Admitting: *Deleted

## 2017-03-10 ENCOUNTER — Encounter: Payer: Self-pay | Admitting: Internal Medicine

## 2017-03-10 ENCOUNTER — Ambulatory Visit (INDEPENDENT_AMBULATORY_CARE_PROVIDER_SITE_OTHER): Payer: Medicare HMO | Admitting: Internal Medicine

## 2017-03-10 VITALS — BP 160/90 | HR 76 | Temp 98.3°F | Ht 66.75 in | Wt 186.0 lb

## 2017-03-10 DIAGNOSIS — E785 Hyperlipidemia, unspecified: Secondary | ICD-10-CM | POA: Diagnosis not present

## 2017-03-10 DIAGNOSIS — Z0000 Encounter for general adult medical examination without abnormal findings: Secondary | ICD-10-CM | POA: Diagnosis not present

## 2017-03-10 DIAGNOSIS — I10 Essential (primary) hypertension: Secondary | ICD-10-CM

## 2017-03-10 DIAGNOSIS — I480 Paroxysmal atrial fibrillation: Secondary | ICD-10-CM | POA: Diagnosis not present

## 2017-03-10 DIAGNOSIS — R7989 Other specified abnormal findings of blood chemistry: Secondary | ICD-10-CM | POA: Diagnosis not present

## 2017-03-10 DIAGNOSIS — I272 Pulmonary hypertension, unspecified: Secondary | ICD-10-CM | POA: Diagnosis not present

## 2017-03-10 LAB — TSH: TSH: 6.2 u[IU]/mL — ABNORMAL HIGH (ref 0.35–4.50)

## 2017-03-10 MED ORDER — AMLODIPINE BESYLATE 10 MG PO TABS: 10.0000 mg | ORAL_TABLET | Freq: Every day | ORAL | 3 refills | Status: DC

## 2017-03-10 NOTE — Patient Instructions (Signed)
Limit your sodium (Salt) intake   return in 1 month for assessment of high blood pressure Cardiology follow-up as scheduled   Limit your sodium (Salt) intake

## 2017-03-10 NOTE — Progress Notes (Signed)
Subjective:    Patient ID: Tammy [name], female    DOB: [birth year], 84 y.o.   MRN: [id number]  HPI  84 year old patient who is seen today for a preventive health examination and subsequent Medicare wellness visit She is followed close by cardiology with a history of atrial fibrillation.  She is scheduled for elective cardioversion tomorrow.  She remains on anticoagulation as well as amiodarone She has essential hypertension.  Systolic blood pressure reading has been elevated.  She remains on statin therapy.  No cardiopulmonary complaints  In February 2016, she was admitted for acute pulmonary embolism associated with syncope. An event monitor in the past has revealed PAF and the patient has been on chronic anticoagulation.  She is followed by cardiology  Only complaint today is decreased range of motion of the left shoulder which seems to be improving  Medical problems include peripheral vascular disease, osteoarthritis, and osteoporosis.  She is now being followed by vascular surgery.   Past Medical History:  Diagnosis Date  . BREAST BIOPSY, HX OF 06/10/2006  . Breast cyst   . HIP PAIN, LEFT 01/31/2007  . HYPERLIPIDEMIA 06/10/2006  . HYPERTENSION 06/10/2006  . Impaired glucose tolerance   . NEOPLASMS UNSPEC NATURE BONE SOFT TISSUE&SKIN 05/31/2008  . OSTEOPOROSIS 06/10/2006  . Pulmonary embolus [facility])      Social History   Socioeconomic History  . Marital status: Married    Spouse name: Not on file  . Number of children: 4  . Years of education: Not on file  . Highest education level: Not on file  Social Needs  . Financial resource strain: Not on file  . Food insecurity - worry: Not on file  . Food insecurity - inability: Not on file  . Transportation needs - medical: Not on file  . Transportation needs - non-medical: Not on file  Occupational History  . Not on file  Tobacco Use  . Smoking status: Never Smoker  . Smokeless tobacco: Current User    Types: Snuff  Substance  and Sexual Activity  . Alcohol use: No    Alcohol/week: 0.0 oz  . Drug use: No  . Sexual activity: Yes  Other Topics Concern  . Not on file  Social History Narrative  . Not on file    Past Surgical History:  Procedure Laterality Date  . ABDOMINAL HYSTERECTOMY    . APPENDECTOMY    . BREAST SURGERY     bx x2  . CHOLECYSTECTOMY  1980    Family History  Problem Relation [name] of Onset  . Heart disease Mother        Pacemaker  . Heart disease Father   . Asthma Father   . Diabetes Father   . Cancer Maternal Aunt        breast  . Cancer Sister   . Heart disease Sister   . Hyperlipidemia Sister   . Hypertension Sister     Allergies  Allergen Reactions  . Tuberculin Tests Other (See Comments)    Tuberculosis shot, red spot on arm    Current Outpatient Medications on File Prior to Visit  Medication Sig Dispense Refill  . acetaminophen (TYLENOL) 500 MG tablet Take 500 mg by mouth every 6 (six) hours as needed for moderate pain or headache.    Marland Kitchen amiodarone (PACERONE) 200 MG tablet TAKE ONE TABLET TWICE DAILY X ONE WEEK THEN DECREASE TO 1 TABLET DAILY (Patient taking differently: Take 200 mg by mouth 2 (two) times daily. ) 45 tablet  12  . amLODipine (NORVASC) 5 MG tablet TAKE 1 TABLET (5 MG TOTAL) BY MOUTH DAILY. 90 tablet 3  . atorvastatin (LIPITOR) 20 MG tablet TAKE 1 TABLET BY MOUTH DAILY AT 6 PM. (Patient taking differently: TAKE 20 MG BY MOUTH DAILY AT 6 PM.) 90 tablet 0  . ELIQUIS 5 MG TABS tablet TAKE 1 TABLET (5 MG TOTAL) BY MOUTH 2 (TWO) TIMES DAILY. 60 tablet 9  . furosemide (LASIX) 20 MG tablet TAKE 1 TABLET (20 MG TOTAL) BY MOUTH DAILY. 90 tablet 3   No current facility-administered medications on file prior to visit.     BP (!) 160/90 (BP Location: Right Arm, Patient Position: Sitting, Cuff Size: Normal)   Pulse 76   Temp 98.3 F (36.8 C) (Oral)   Ht 5' 6.75" (1.695 m)   Wt 186 lb (84.4 kg)   SpO2 97%   BMI 29.35 kg/m    Subsequent Medicare wellness  visit  1. Risk factors, based on past M,S,F history- cardiovascular risk factors include [name] hypertension and dyslipidemia .  She has known PAD 2. Physical activities: Fairly active and independent but no rigorous exercise program .  History of left leg claudication 3. Depression/mood: No history depression or mood disorder  4. Hearing: No major deficits  5. ADL's: Remains independent in all aspects of daily living  6. Fall risk: Moderate due to [name]  17. Home safety: No problems identified  8. Height weight, and visual acuity; height and weight fairly stable. no change in visual acuity  9. Counseling: Heart healthy diet more regular exercise are encouraged  10. Lab orders based on risk factors: Laboratory profile including lipid panel will be reviewed  11. Referral : Follow-up cardiology and vascular surgery 12. Care plan: Low-salt heart healthy diet encouraged we'll continue calcium and vitamin D supplements  13. Cognitive assessment: Alert and oriented with normal affect. No cognitive dysfunction  14.  Patient was provided with a written and personalized care plan.  Preventive services Should include an annual health examination with screening lab.  Annual eye examination recommended.  Cardiology follow-up in vascular surgery follow-up.  Also encouraged  15.  Provider list includes primary care cardiology, ophthalmology and vascular surgery as well as cardiology   Review of Systems  Constitutional: Negative.   HENT: Negative for congestion, dental problem, hearing loss, rhinorrhea, sinus pressure, sore throat and tinnitus.   Eyes: Negative for pain, discharge and visual disturbance.  Respiratory: Negative for cough and shortness of breath.   Cardiovascular: Positive for leg swelling. Negative for chest pain and palpitations.  Gastrointestinal: Negative for abdominal distention, abdominal pain, blood in stool, constipation, diarrhea, nausea and vomiting.  Genitourinary: Negative for  difficulty urinating, dysuria, flank pain, frequency, hematuria, pelvic pain, urgency, vaginal bleeding, vaginal discharge and vaginal pain.  Musculoskeletal: Positive for back pain and gait problem. Negative for arthralgias and joint swelling.  Skin: Negative for rash.  Neurological: Negative for dizziness, syncope, speech difficulty, weakness, numbness and headaches.  Hematological: Negative for adenopathy.  Psychiatric/Behavioral: Negative for agitation, behavioral problems and dysphoric mood. The patient is not nervous/anxious.        Objective:   Physical Exam  Constitutional: She is oriented to person, place, and time. She appears well-developed and well-nourished.  Blood pressure 176/80  HENT:  Head: Normocephalic and atraumatic.  Right Ear: External ear normal.  Left Ear: External ear normal.  Mouth/Throat: Oropharynx is clear and moist.  Dentures in place  Eyes: Conjunctivae and EOM are normal.  Neck:  Normal range of motion. Neck supple. No JVD present. No thyromegaly present.  Cardiovascular: Normal rate and normal heart sounds.  No murmur heard. Irregular with controlled ventricular response  The right dorsalis pedis pulse intact other pedal pulses not easily palpable  Pulmonary/Chest: Effort normal and breath sounds normal. She has no wheezes. She has no rales.  Kyphosis  Abdominal: Soft. Bowel sounds are normal. She exhibits no distension and no mass. There is no tenderness. There is no rebound and no guarding.  Musculoskeletal: Normal range of motion. She exhibits edema. She exhibits no tenderness.  Lower extremity edema right leg greater than left with some stasis changes  Neurological: She is alert and oriented to person, place, and time. She has normal reflexes. No cranial nerve deficit. She exhibits normal muscle tone. Coordination normal.  Decreased monofilament testing right leg greater than left  Skin: Skin is warm and dry. No rash noted.  Psychiatric: She has a  normal mood and affect. Her behavior is normal.          Assessment & Plan:  Preventive health examination Chronic atrial fibrillation Subsequent Medicare wellness visit Hypertension.  Systolic readings have been persistently elevated.  Will increase amlodipine to 10 mg daily.  Follow-up 4 weeks History of elevated TSH.  Will check a follow-up TSH Chronic anticoagulation Dyslipidemia continue statin therapy PAD stable.  Follow-up vascular surgery  [name]

## 2017-03-11 ENCOUNTER — Other Ambulatory Visit: Payer: Self-pay

## 2017-03-11 ENCOUNTER — Ambulatory Visit (HOSPITAL_COMMUNITY)
Admission: RE | Admit: 2017-03-11 | Discharge: 2017-03-11 | Disposition: A | Discharge status: Home / Self Care | Payer: Medicare HMO | Source: Ambulatory Visit | Attending: Cardiovascular Disease | Admitting: Cardiovascular Disease

## 2017-03-11 ENCOUNTER — Encounter (HOSPITAL_COMMUNITY)
Admission: RE | Disposition: A | Discharge status: Home / Self Care | Payer: Self-pay | Source: Ambulatory Visit | Attending: Cardiovascular Disease

## 2017-03-11 ENCOUNTER — Ambulatory Visit (HOSPITAL_COMMUNITY): Payer: Medicare HMO | Admitting: Anesthesiology

## 2017-03-11 ENCOUNTER — Encounter (HOSPITAL_COMMUNITY): Payer: Self-pay | Admitting: *Deleted

## 2017-03-11 DIAGNOSIS — I4891 Unspecified atrial fibrillation: Secondary | ICD-10-CM | POA: Insufficient documentation

## 2017-03-11 DIAGNOSIS — E785 Hyperlipidemia, unspecified: Secondary | ICD-10-CM | POA: Diagnosis not present

## 2017-03-11 DIAGNOSIS — Z86711 Personal history of pulmonary embolism: Secondary | ICD-10-CM | POA: Diagnosis not present

## 2017-03-11 DIAGNOSIS — I10 Essential (primary) hypertension: Secondary | ICD-10-CM | POA: Insufficient documentation

## 2017-03-11 DIAGNOSIS — Z887 Allergy status to serum and vaccine status: Secondary | ICD-10-CM | POA: Diagnosis not present

## 2017-03-11 DIAGNOSIS — Z7901 Long term (current) use of anticoagulants: Secondary | ICD-10-CM | POA: Diagnosis not present

## 2017-03-11 DIAGNOSIS — I272 Pulmonary hypertension, unspecified: Secondary | ICD-10-CM | POA: Diagnosis not present

## 2017-03-11 DIAGNOSIS — I4819 Other persistent atrial fibrillation: Secondary | ICD-10-CM

## 2017-03-11 DIAGNOSIS — I739 Peripheral vascular disease, unspecified: Secondary | ICD-10-CM | POA: Insufficient documentation

## 2017-03-11 DIAGNOSIS — Z79899 Other long term (current) drug therapy: Secondary | ICD-10-CM | POA: Insufficient documentation

## 2017-03-11 DIAGNOSIS — I440 Atrioventricular block, first degree: Secondary | ICD-10-CM | POA: Insufficient documentation

## 2017-03-11 DIAGNOSIS — I071 Rheumatic tricuspid insufficiency: Secondary | ICD-10-CM | POA: Diagnosis not present

## 2017-03-11 DIAGNOSIS — I498 Other specified cardiac arrhythmias: Secondary | ICD-10-CM | POA: Diagnosis not present

## 2017-03-11 DIAGNOSIS — Z8249 Family history of ischemic heart disease and other diseases of the circulatory system: Secondary | ICD-10-CM | POA: Diagnosis not present

## 2017-03-11 DIAGNOSIS — M810 Age-related osteoporosis without current pathological fracture: Secondary | ICD-10-CM | POA: Insufficient documentation

## 2017-03-11 HISTORY — PX: CARDIOVERSION: SHX1299

## 2017-03-11 LAB — POCT I-STAT 4, (NA,K, GLUC, HGB,HCT)
GLUCOSE: 137 mg/dL — AB (ref 65–99)
HEMATOCRIT: 43 % (ref 36.0–46.0)
HEMOGLOBIN: 14.6 g/dL (ref 12.0–15.0)
Potassium: 3.3 mmol/L — ABNORMAL LOW (ref 3.5–5.1)
Sodium: 145 mmol/L (ref 135–145)

## 2017-03-11 SURGERY — CARDIOVERSION
Anesthesia: General

## 2017-03-11 MED ORDER — LEVOTHYROXINE SODIUM 25 MCG PO TABS: 25.0000 ug | ORAL_TABLET | Freq: Every day | ORAL | 0 refills | Status: DC

## 2017-03-11 MED ORDER — SODIUM CHLORIDE 0.9 % IV SOLN
250.0000 mL | INTRAVENOUS | Status: DC
  Administered 2017-03-11: 12:00:00 via INTRAVENOUS
  Administered 2017-03-11: 250 mL via INTRAVENOUS

## 2017-03-11 MED ORDER — SODIUM CHLORIDE 0.9% FLUSH: 3.0000 mL | INTRAVENOUS | Status: DC | PRN

## 2017-03-11 MED ORDER — LIDOCAINE HCL (CARDIAC) 20 MG/ML IV SOLN
INTRAVENOUS | Status: DC | PRN
  Administered 2017-03-11: 60 mg via INTRAVENOUS

## 2017-03-11 MED ORDER — PROPOFOL 10 MG/ML IV BOLUS
INTRAVENOUS | Status: DC | PRN
  Administered 2017-03-11: 70 mg via INTRAVENOUS

## 2017-03-11 MED ORDER — SODIUM CHLORIDE 0.9% FLUSH: 3.0000 mL | Freq: Two times a day (BID) | INTRAVENOUS | Status: DC

## 2017-03-11 NOTE — Transfer of Care (Signed)
Immediate Anesthesia Transfer of Care Note  Patient: Tammy [name]  Procedure(s) Performed: CARDIOVERSION (N/A )  Patient Location: Endoscopy Unit  Anesthesia Type:General  Level of Consciousness: awake and alert   Airway & Oxygen Therapy: Patient Spontanous Breathing  Post-op Assessment: Report given to RN and Post -op Vital signs reviewed and stable  Post vital signs: Reviewed and stable  Last Vitals:  Vitals:   03/11/17 1235 03/11/17 1236  BP: (!) 112/56   Pulse: 64 73  Resp: 16 20  Temp:  36.7 C  SpO2: 99% 98%    Last Pain:  Vitals:   03/11/17 1236  TempSrc: Oral         Complications: No apparent anesthesia complications

## 2017-03-11 NOTE — Anesthesia Preprocedure Evaluation (Addendum)
Anesthesia Evaluation  Patient identified by MRN, date of birth, ID band Patient awake    Reviewed: Allergy & Precautions, NPO status , Patient's Chart, lab work & pertinent test results  History of Anesthesia Complications Negative for: history of anesthetic complications  Airway Mallampati: II   Neck ROM: Full    Dental  (+) Partial Lower, Partial Upper   Pulmonary neg pulmonary ROS,    Pulmonary exam normal        Cardiovascular hypertension, Pt. on medications negative cardio ROS  + dysrhythmias Atrial Fibrillation  Rhythm:Irregular Rate:Normal  Study Conclusions  - Left ventricle: Systolic function was normal. The estimated ejection fraction was in the range of 60% to 65%. - Aortic valve: There was mild regurgitation.     Neuro/Psych negative neurological ROS  negative psych ROS   GI/Hepatic negative GI ROS, Neg liver ROS,   Endo/Other  negative endocrine ROS  Renal/GU negative Renal ROS  negative genitourinary   Musculoskeletal negative musculoskeletal ROS (+)   Abdominal   Peds negative pediatric ROS (+)  Hematology negative hematology ROS (+)   Anesthesia Other Findings   Reproductive/Obstetrics negative OB ROS                            Anesthesia Physical Anesthesia Plan  ASA: III  Anesthesia Plan: General   Post-op Pain Management:    Induction: Intravenous  PONV Risk Score and Plan: 2 and Propofol infusion and Treatment may vary due to age or medical condition  Airway Management Planned: Mask  Additional Equipment:   Intra-op Plan:   Post-operative Plan:   Informed Consent: I have reviewed the patients History and Physical, chart, labs and discussed the procedure including the risks, benefits and alternatives for the proposed anesthesia with the patient or authorized representative who has indicated his/her understanding and acceptance.   Dental  advisory given  Plan Discussed with: Anesthesiologist and CRNA  Anesthesia Plan Comments:        Anesthesia Quick Evaluation

## 2017-03-11 NOTE — Interval H&P Note (Signed)
History and Physical Interval Note:  03/11/2017 10:47 AM  Tammy [name]  has presented today for surgery, with the diagnosis of AFIB  The various methods of treatment have been discussed with the patient and family. After consideration of risks, benefits and other options for treatment, the patient has consented to  Procedure(s): CARDIOVERSION (N/A) as a surgical intervention .  The patient's history has been reviewed, patient examined, no change in status, stable for surgery.  I have reviewed the patient's chart and labs.  Questions were answered to the patient's satisfaction.     Natassja Ollis

## 2017-03-11 NOTE — Anesthesia Postprocedure Evaluation (Signed)
Anesthesia Post Note  Patient: Tammy [name]  Procedure(s) Performed: CARDIOVERSION (N/A )     Patient location during evaluation: PACU Anesthesia Type: General Level of consciousness: sedated Pain management: pain level controlled Vital Signs Assessment: post-procedure vital signs reviewed and stable Respiratory status: spontaneous breathing and respiratory function stable Cardiovascular status: stable Postop Assessment: no apparent nausea or vomiting Anesthetic complications: no    Last Vitals:  Vitals:   03/11/17 1250 03/11/17 1255  BP: (!) 112/95   Pulse: (!) 55 (!) 58  Resp: 20 17  Temp:    SpO2: 94% 95%    Last Pain:  Vitals:   03/11/17 1236  TempSrc: Oral                 Shenita Trego DANIEL

## 2017-03-11 NOTE — Op Note (Signed)
Procedure: Electrical Cardioversion Indications:  Atrial Fibrillation  Procedure Details:  Consent: Risks of procedure as well as the alternatives and risks of each were explained to the (patient/caregiver).  Consent for procedure obtained.  Time Out: Verified patient identification, verified procedure, site/side was marked, verified correct patient position, special equipment/implants available, medications/allergies/relevent history reviewed, required imaging and test results available.  Performed  Patient placed on cardiac monitor, pulse oximetry, supplemental oxygen as necessary.  Sedation given: Dr. Tobias Alexander, propofol 70 mg IV Pacer pads placed anterior and posterior chest.  Cardioverted 2 time(s).  Cardioversion with synchronized biphasic 120J shock unsuccessful. Second synchronized shock 200J converted to SR with PACs and first degree AV block.  Evaluation: Findings: Post procedure EKG shows: NSR Complications: None Patient did tolerate procedure well.  Time Spent Directly with the Patient:  30 minutes   Charles Andringa 03/11/2017, 12:34 PM

## 2017-03-11 NOTE — Discharge Instructions (Signed)
Electrical Cardioversion, Care After °This sheet gives you information about how to care for yourself after your procedure. Your health care provider may also give you more specific instructions. If you have problems or questions, contact your health care provider. °What can I expect after the procedure? °After the procedure, it is common to have: °· Some redness on the skin where the shocks were given. ° °Follow these instructions at home: °· Do not drive for 24 hours if you were given a medicine to help you relax (sedative). °· Take over-the-counter and prescription medicines only as told by your health care provider. °· Ask your health care provider how to check your pulse. Check it often. °· Rest for 48 hours after the procedure or as told by your health care provider. °· Avoid or limit your caffeine use as told by your health care provider. °Contact a health care provider if: °· You feel like your heart is beating too quickly or your pulse is not regular. °· You have a serious muscle cramp that does not go away. °Get help right away if: °· You have discomfort in your chest. °· You are dizzy or you feel faint. °· You have trouble breathing or you are short of breath. °· Your speech is slurred. °· You have trouble moving an arm or leg on one side of your body. °· Your fingers or toes turn cold or blue. °This information is not intended to replace advice given to you by your health care provider. Make sure you discuss any questions you have with your health care provider. °Document Released: 11/02/2012 Document Revised: 08/16/2015 Document Reviewed: 07/19/2015 °Elsevier Interactive Patient Education © 2018 [employer] ° °

## 2017-03-12 ENCOUNTER — Encounter (HOSPITAL_COMMUNITY): Payer: Self-pay | Admitting: Cardiovascular Disease

## 2017-03-24 ENCOUNTER — Ambulatory Visit: Payer: Medicare HMO | Admitting: Adult Health

## 2017-03-24 ENCOUNTER — Encounter: Payer: Self-pay | Admitting: Internal Medicine

## 2017-03-24 ENCOUNTER — Telehealth: Payer: Self-pay

## 2017-03-24 ENCOUNTER — Ambulatory Visit (INDEPENDENT_AMBULATORY_CARE_PROVIDER_SITE_OTHER): Payer: Medicare HMO | Admitting: Internal Medicine

## 2017-03-24 VITALS — BP 122/60 | HR 124 | Temp 98.0°F | Wt 186.0 lb

## 2017-03-24 DIAGNOSIS — I480 Paroxysmal atrial fibrillation: Secondary | ICD-10-CM | POA: Diagnosis not present

## 2017-03-24 DIAGNOSIS — I2699 Other pulmonary embolism without acute cor pulmonale: Secondary | ICD-10-CM

## 2017-03-24 DIAGNOSIS — I10 Essential (primary) hypertension: Secondary | ICD-10-CM | POA: Diagnosis not present

## 2017-03-24 MED ORDER — AMLODIPINE BESYLATE 5 MG PO TABS: 5.0000 mg | ORAL_TABLET | Freq: Every day | ORAL | 3 refills | Status: DC

## 2017-03-24 MED ORDER — METOPROLOL SUCCINATE ER 25 MG PO TB24: 25.0000 mg | ORAL_TABLET | Freq: Every day | ORAL | 3 refills | Status: DC

## 2017-03-24 NOTE — Patient Instructions (Addendum)
Continue amlodipine at a dose of 5 mg daily  Metoprolol 25 mg once daily  Cardiology follow-up as scheduled  Call for any worsening or new symptoms such as shortness of breath weakness or chest pain

## 2017-03-24 NOTE — Progress Notes (Signed)
Subjective:    Patient ID: Tammy [name] Age, female    DOB: 09-12-1931, 84 y.o.   MRN: [id number]  HPI  84 year old patient who has a history of paroxysmal atrial fibrillation.  She was seen here in the office for her annual exam on February 13 and was noted to be in atrial fibrillation with controlled ventricular response.  Systolic blood pressure was elevated and amlodipine was increased from 5-10 mg daily. The following day she underwent elective cardioversion.  She remains on amiodarone as well as chronic anticoagulation. For the past 3 or 4 days she has had increasing weakness and some shortness of breath.  Due to weakness she has been using amlodipine 5 mg for the past 2 days.  Denies any chest pain.  Past Medical History:  Diagnosis Date  . BREAST BIOPSY, HX OF 06/10/2006  . Breast cyst   . HIP PAIN, LEFT 01/31/2007  . HYPERLIPIDEMIA 06/10/2006  . HYPERTENSION 06/10/2006  . Impaired glucose tolerance   . NEOPLASMS UNSPEC NATURE BONE SOFT TISSUE&SKIN 05/31/2008  . OSTEOPOROSIS 06/10/2006  . Pulmonary embolus [facility])      Social History   Socioeconomic History  . Marital status: Married    Spouse name: Not on file  . Number of children: 4  . Years of education: Not on file  . Highest education level: Not on file  Social Needs  . Financial resource strain: Not on file  . Food insecurity - worry: Not on file  . Food insecurity - inability: Not on file  . Transportation needs - medical: Not on file  . Transportation needs - non-medical: Not on file  Occupational History  . Not on file  Tobacco Use  . Smoking status: Never Smoker  . Smokeless tobacco: Current User    Types: Snuff  Substance and Sexual Activity  . Alcohol use: No    Alcohol/week: 0.0 oz  . Drug use: No  . Sexual activity: Yes  Other Topics Concern  . Not on file  Social History Narrative  . Not on file    Past Surgical History:  Procedure Laterality Date  . ABDOMINAL HYSTERECTOMY    . APPENDECTOMY    .  BREAST SURGERY     bx x2  . CARDIOVERSION N/A 03/11/2017   Procedure: CARDIOVERSION;  Surgeon: Sanda Klein, MD;  Location: [facility] ENDOSCOPY;  Service: Cardiovascular;  Laterality: N/A;  . CHOLECYSTECTOMY  1980    Family History  Problem Relation Age of Onset  . Heart disease Mother        Pacemaker  . Heart disease Father   . Asthma Father   . Diabetes Father   . Cancer Maternal Aunt        breast  . Cancer Sister   . Heart disease Sister   . Hyperlipidemia Sister   . Hypertension Sister     Allergies  Allergen Reactions  . Tuberculin Tests Other (See Comments)    Tuberculosis shot, red spot on arm    Current Outpatient Medications on File Prior to Visit  Medication Sig Dispense Refill  . acetaminophen (TYLENOL) 500 MG tablet Take 500 mg by mouth every 6 (six) hours as needed for moderate pain or headache.    Marland Kitchen amiodarone (PACERONE) 200 MG tablet TAKE ONE TABLET TWICE DAILY X ONE WEEK THEN DECREASE TO 1 TABLET DAILY (Patient taking differently: Take 200 mg by mouth 2 (two) times daily. ) 45 tablet 12  . atorvastatin (LIPITOR) 20 MG tablet TAKE 1 TABLET BY  MOUTH DAILY AT 6 PM. (Patient taking differently: TAKE 20 MG BY MOUTH DAILY AT 6 PM.) 90 tablet 0  . ELIQUIS 5 MG TABS tablet TAKE 1 TABLET (5 MG TOTAL) BY MOUTH 2 (TWO) TIMES DAILY. 60 tablet 9  . furosemide (LASIX) 20 MG tablet TAKE 1 TABLET (20 MG TOTAL) BY MOUTH DAILY. 90 tablet 3  . levothyroxine (SYNTHROID, LEVOTHROID) 25 MCG tablet Take 1 tablet (25 mcg total) by mouth daily. 90 tablet 0   No current facility-administered medications on file prior to visit.     BP 122/60 (BP Location: Right Arm, Patient Position: Sitting, Cuff Size: Normal)   Pulse (!) 124   Temp 98 F (36.7 C) (Oral)   Wt 186 lb (84.4 kg)   SpO2 90%   BMI 29.35 kg/m     Review of Systems  Constitutional: Positive for fatigue.  HENT: Negative for congestion, dental problem, hearing loss, rhinorrhea, sinus pressure, sore throat and tinnitus.    Eyes: Negative for pain, discharge and visual disturbance.  Respiratory: Positive for shortness of breath. Negative for cough.   Cardiovascular: Negative for chest pain, palpitations and leg swelling.  Gastrointestinal: Negative for abdominal distention, abdominal pain, blood in stool, constipation, diarrhea, nausea and vomiting.  Genitourinary: Negative for difficulty urinating, dysuria, flank pain, frequency, hematuria, pelvic pain, urgency, vaginal bleeding, vaginal discharge and vaginal pain.  Musculoskeletal: Negative for arthralgias, gait problem and joint swelling.  Skin: Negative for rash.  Neurological: Negative for dizziness, syncope, speech difficulty, weakness, numbness and headaches.  Hematological: Negative for adenopathy.  Psychiatric/Behavioral: Negative for agitation, behavioral problems and dysphoric mood. The patient is not nervous/anxious.        Objective:   Physical Exam  Constitutional: She is oriented to person, place, and time. She appears well-developed and well-nourished.  Blood pressure 122/70 Pulse 120 no distress   HENT:  Head: Normocephalic.  Right Ear: External ear normal.  Left Ear: External ear normal.  Mouth/Throat: Oropharynx is clear and moist.  Eyes: Conjunctivae and EOM are normal. Pupils are equal, round, and reactive to light.  Neck: Normal range of motion. Neck supple. No thyromegaly present.  Cardiovascular: Normal rate, normal heart sounds and intact distal pulses.  Irregular rhythm with a rate of 120  Pulmonary/Chest: Effort normal and breath sounds normal.  Abdominal: Soft. Bowel sounds are normal. She exhibits no mass. There is no tenderness.  Musculoskeletal: Normal range of motion. She exhibits edema.  Stable +1 pedal edema  Lymphadenopathy:    She has no cervical adenopathy.  Neurological: She is alert and oriented to person, place, and time.  Skin: Skin is warm and dry. No rash noted.  Psychiatric: She has a normal mood and  affect. Her behavior is normal.          Assessment & Plan:   Atrial fibrillation with rapid ventricular response.  Will add metoprolol 25 mg daily.  Continue amiodarone and anticoagulation.  Discussed with cardiology who will contact patient for follow-up later this week or early next week.  Patient will report any clinical worsening.  Hypertension.  Amlodipine added to her regimen for rate control.  Will continue 5 mg amlodipine dose (hold 10 mg amlodipine dose).  Follow-up 3 weeks as scheduled Dyslipidemia Chronic anticoagulation  [name]

## 2017-03-24 NOTE — Telephone Encounter (Signed)
DOD Call for Dr. Rayann Heman-  Dr. Burnice Logan calling. Patient with recent DCCV back in Afib. Per Dr. Rayann Heman patient to start Toprol-XL 25 mg QD and follow up with Roderic Palau, NP in the [facility] early next week. Will send message to Kerby Less RN to coordinate aoppointment.

## 2017-03-25 ENCOUNTER — Ambulatory Visit (HOSPITAL_COMMUNITY)
Admission: RE | Admit: 2017-03-25 | Discharge: 2017-03-25 | Disposition: A | Discharge status: Home / Self Care | Payer: Medicare HMO | Source: Ambulatory Visit | Attending: Nurse Practitioner | Admitting: Nurse Practitioner

## 2017-03-25 ENCOUNTER — Encounter (HOSPITAL_COMMUNITY): Payer: Self-pay | Admitting: Nurse Practitioner

## 2017-03-25 VITALS — BP 122/74 | HR 70 | Ht 66.75 in | Wt 185.8 lb

## 2017-03-25 DIAGNOSIS — Z833 Family history of diabetes mellitus: Secondary | ICD-10-CM | POA: Diagnosis not present

## 2017-03-25 DIAGNOSIS — I10 Essential (primary) hypertension: Secondary | ICD-10-CM | POA: Diagnosis not present

## 2017-03-25 DIAGNOSIS — I480 Paroxysmal atrial fibrillation: Secondary | ICD-10-CM | POA: Insufficient documentation

## 2017-03-25 DIAGNOSIS — Z8249 Family history of ischemic heart disease and other diseases of the circulatory system: Secondary | ICD-10-CM | POA: Insufficient documentation

## 2017-03-25 DIAGNOSIS — Z79899 Other long term (current) drug therapy: Secondary | ICD-10-CM | POA: Diagnosis not present

## 2017-03-25 DIAGNOSIS — Z9049 Acquired absence of other specified parts of digestive tract: Secondary | ICD-10-CM | POA: Diagnosis not present

## 2017-03-25 DIAGNOSIS — Z825 Family history of asthma and other chronic lower respiratory diseases: Secondary | ICD-10-CM | POA: Diagnosis not present

## 2017-03-25 DIAGNOSIS — E785 Hyperlipidemia, unspecified: Secondary | ICD-10-CM | POA: Insufficient documentation

## 2017-03-25 DIAGNOSIS — Z79890 Hormone replacement therapy (postmenopausal): Secondary | ICD-10-CM | POA: Insufficient documentation

## 2017-03-25 DIAGNOSIS — Z7901 Long term (current) use of anticoagulants: Secondary | ICD-10-CM | POA: Diagnosis not present

## 2017-03-25 DIAGNOSIS — Z86711 Personal history of pulmonary embolism: Secondary | ICD-10-CM | POA: Insufficient documentation

## 2017-03-25 DIAGNOSIS — Z90710 Acquired absence of both cervix and uterus: Secondary | ICD-10-CM | POA: Diagnosis not present

## 2017-03-25 DIAGNOSIS — I4891 Unspecified atrial fibrillation: Secondary | ICD-10-CM | POA: Diagnosis present

## 2017-03-25 NOTE — Progress Notes (Signed)
Primary Care Physician: Marletta Lor, MD Referring Physician:Dr. Allred Cardiologist: Dr. Julian Reil is a 84 y.o. female with a h/o afib s/p loading of amiodarone 2/4, 200 mg x 1 week and then 200 mg daily and had successful cardioversion 2/14. She saw  her PCP yesterday for acute shortness of breath and was found to be in afib. Dr. Burnice Logan called Dr. Rayann Heman and Toprol 25 mg daily was started and asked to be seen in the afib clinic today. She is back in SR today at 70 bpm and shortness of breath has resolved.  Today, she denies symptoms of palpitations, chest pain, shortness of breath, orthopnea, PND, lower extremity edema, dizziness, presyncope, syncope, or neurologic sequela. The patient is tolerating medications without difficulties and is otherwise without complaint today.   Past Medical History:  Diagnosis Date  . BREAST BIOPSY, HX OF 06/10/2006  . Breast cyst   . HIP PAIN, LEFT 01/31/2007  . HYPERLIPIDEMIA 06/10/2006  . HYPERTENSION 06/10/2006  . Impaired glucose tolerance   . NEOPLASMS UNSPEC NATURE BONE SOFT TISSUE&SKIN 05/31/2008  . OSTEOPOROSIS 06/10/2006  . Pulmonary embolus [facility])    Past Surgical History:  Procedure Laterality Date  . ABDOMINAL HYSTERECTOMY    . APPENDECTOMY    . BREAST SURGERY     bx x2  . CARDIOVERSION N/A 03/11/2017   Procedure: CARDIOVERSION;  Surgeon: Sanda Klein, MD;  Location: [facility];  Service: Cardiovascular;  Laterality: N/A;  . CHOLECYSTECTOMY  1980    Current Outpatient Medications  Medication Sig Dispense Refill  . acetaminophen (TYLENOL) 500 MG tablet Take 500 mg by mouth every 6 (six) hours as needed for moderate pain or headache.    Marland Kitchen amiodarone (PACERONE) 200 MG tablet Take 200 mg by mouth daily.    Marland Kitchen amLODipine (NORVASC) 5 MG tablet Take 1 tablet (5 mg total) by mouth daily. 30 tablet 3  . atorvastatin (LIPITOR) 20 MG tablet TAKE 1 TABLET BY MOUTH DAILY AT 6 PM. (Patient taking differently: TAKE 20 MG BY  MOUTH DAILY AT 6 PM.) 90 tablet 0  . ELIQUIS 5 MG TABS tablet TAKE 1 TABLET (5 MG TOTAL) BY MOUTH 2 (TWO) TIMES DAILY. 60 tablet 9  . furosemide (LASIX) 20 MG tablet TAKE 1 TABLET (20 MG TOTAL) BY MOUTH DAILY. 90 tablet 3  . levothyroxine (SYNTHROID, LEVOTHROID) 25 MCG tablet Take 1 tablet (25 mcg total) by mouth daily. 90 tablet 0  . metoprolol succinate (TOPROL-XL) 25 MG 24 hr tablet Take 1 tablet (25 mg total) by mouth daily. 90 tablet 3   No current facility-administered medications for this encounter.     Allergies  Allergen Reactions  . Tuberculin Tests Other (See Comments)    Tuberculosis shot, red spot on arm    Social History   Socioeconomic History  . Marital status: Married    Spouse name: Not on file  . Number of children: 4  . Years of education: Not on file  . Highest education level: Not on file  Social Needs  . Financial resource strain: Not on file  . Food insecurity - worry: Not on file  . Food insecurity - inability: Not on file  . Transportation needs - medical: Not on file  . Transportation needs - non-medical: Not on file  Occupational History  . Not on file  Tobacco Use  . Smoking status: Never Smoker  . Smokeless tobacco: Current User    Types: Snuff  Substance and Sexual Activity  .  Alcohol use: No    Alcohol/week: 0.0 oz  . Drug use: No  . Sexual activity: Yes  Other Topics Concern  . Not on file  Social History Narrative  . Not on file    Family History  Problem Relation Age of Onset  . Heart disease Mother        Pacemaker  . Heart disease Father   . Asthma Father   . Diabetes Father   . Cancer Maternal Aunt        breast  . Cancer Sister   . Heart disease Sister   . Hyperlipidemia Sister   . Hypertension Sister     ROS- All systems are reviewed and negative except as per the HPI above  Physical Exam: Vitals:   03/25/17 1429  BP: 122/74  Pulse: 70  Weight: 185 lb 12.8 oz (84.3 kg)  Height: 5' 6.75" (1.695 m)   Wt  Readings from Last 3 Encounters:  03/25/17 185 lb 12.8 oz (84.3 kg)  03/24/17 186 lb (84.4 kg)  03/11/17 186 lb (84.4 kg)    Labs: Lab Results  Component Value Date   NA 145 03/11/2017   K 3.3 (L) 03/11/2017   CL 104 03/01/2017   CO2 21 03/01/2017   GLUCOSE 137 (H) 03/11/2017   BUN 23 03/01/2017   CREATININE 1.07 (H) 03/01/2017   CALCIUM 10.0 03/01/2017   MG 1.6 02/23/2014   Lab Results  Component Value Date   INR 1.21 02/23/2014   Lab Results  Component Value Date   CHOL 135 11/25/2016   HDL 65 11/25/2016   LDLCALC 48 11/25/2016   TRIG 110 11/25/2016     GEN- The patient is well appearing, alert and oriented x 3 today.   Head- normocephalic, atraumatic Eyes-  Sclera clear, conjunctiva pink Ears- hearing intact Oropharynx- clear Neck- supple, no JVP Lymph- no cervical lymphadenopathy Lungs- Clear to ausculation bilaterally, normal work of breathing Heart- Regular rate and rhythm, no murmurs, rubs or gallops, PMI not laterally displaced GI- soft, NT, ND, + BS Extremities- no clubbing, cyanosis, + for bilateral edema(chronic) MS- no significant deformity or atrophy Skin- no rash or lesion Psych- euthymic mood, full affect Neuro- strength and sensation are intact  EKG- Sinus rhythm  at  70 bpm. with first degree AV block, PAC's RBBB, pr int 288 ms, qrs int 128 ms, qtc 553ms Epic records reviewed   Assessment and Plan: 1. Paroxysmal afib General education re afib, possible triggers/symptoms Now back in SR Continue amiodarone 200 mg daily Continue Toprol xl 25 mg daily Continue eliquis 5 mg bid for chadsvasc score of at least 5  2. HTN Stable    F/u with PCP 3/13 afib clinic as needed

## 2017-04-07 ENCOUNTER — Ambulatory Visit (INDEPENDENT_AMBULATORY_CARE_PROVIDER_SITE_OTHER): Payer: Medicare HMO | Admitting: Internal Medicine

## 2017-04-07 ENCOUNTER — Encounter: Payer: Self-pay | Admitting: Internal Medicine

## 2017-04-07 VITALS — BP 160/72 | HR 54 | Temp 98.0°F | Wt 188.0 lb

## 2017-04-07 DIAGNOSIS — I10 Essential (primary) hypertension: Secondary | ICD-10-CM

## 2017-04-07 DIAGNOSIS — I480 Paroxysmal atrial fibrillation: Secondary | ICD-10-CM | POA: Diagnosis not present

## 2017-04-07 NOTE — Progress Notes (Signed)
Subjective:    Patient ID: Tammy [name], female    DOB: 1931/06/07, 84 y.o.   MRN: [id number]  HPI  84 year old patient who is seen today for follow-up of hypertension.  Amlodipine dose was increased from 5-10 mg recently due to elevated systolic readings.  At the time of her last office visit she was complaining of weakness and some shortness of breath and she had down titrated amlodipine to 5 mg daily.  She was noted to have A. fib with rapid ventricular response and amlodipine 25 mg was added to her regimen.  She was seen in the atrial fib clinic the following day and had converted to a normal sinus rhythm she continues to do well.  She remains on amlodipine 5 mg and metoprolol 25 mg  Past Medical History:  Diagnosis Date  . BREAST BIOPSY, HX OF 06/10/2006  . Breast cyst   . HIP PAIN, LEFT 01/31/2007  . HYPERLIPIDEMIA 06/10/2006  . HYPERTENSION 06/10/2006  . Impaired glucose tolerance   . NEOPLASMS UNSPEC NATURE BONE SOFT TISSUE&SKIN 05/31/2008  . OSTEOPOROSIS 06/10/2006  . Pulmonary embolus [facility])      Social History   Socioeconomic History  . Marital status: Married    Spouse name: Not on file  . Number of children: 4  . Years of education: Not on file  . Highest education level: Not on file  Social Needs  . Financial resource strain: Not on file  . Food insecurity - worry: Not on file  . Food insecurity - inability: Not on file  . Transportation needs - medical: Not on file  . Transportation needs - non-medical: Not on file  Occupational History  . Not on file  Tobacco Use  . Smoking status: Never Smoker  . Smokeless tobacco: Current User    Types: Snuff  Substance and Sexual Activity  . Alcohol use: No    Alcohol/week: 0.0 oz  . Drug use: No  . Sexual activity: Yes  Other Topics Concern  . Not on file  Social History Narrative  . Not on file    Past Surgical History:  Procedure Laterality Date  . ABDOMINAL HYSTERECTOMY    . APPENDECTOMY    . BREAST SURGERY     bx x2  . CARDIOVERSION N/A 03/11/2017   Procedure: CARDIOVERSION;  Surgeon: Sanda Klein, MD;  Location: [facility];  Service: Cardiovascular;  Laterality: N/A;  . CHOLECYSTECTOMY  1980    Family History  Problem Relation [name] of Onset  . Heart disease Mother        Pacemaker  . Heart disease Father   . Asthma Father   . Diabetes Father   . Cancer Maternal Aunt        breast  . Cancer Sister   . Heart disease Sister   . Hyperlipidemia Sister   . Hypertension Sister     Allergies  Allergen Reactions  . Tuberculin Tests Other (See Comments)    Tuberculosis shot, red spot on arm    Current Outpatient Medications on File Prior to Visit  Medication Sig Dispense Refill  . acetaminophen (TYLENOL) 500 MG tablet Take 500 mg by mouth every 6 (six) hours as needed for moderate pain or headache.    Marland Kitchen amiodarone (PACERONE) 200 MG tablet Take 200 mg by mouth daily.    Marland Kitchen amLODipine (NORVASC) 5 MG tablet Take 1 tablet (5 mg total) by mouth daily. 30 tablet 3  . atorvastatin (LIPITOR) 20 MG tablet TAKE 1 TABLET BY  MOUTH DAILY AT 6 PM. (Patient taking differently: TAKE 20 MG BY MOUTH DAILY AT 6 PM.) 90 tablet 0  . ELIQUIS 5 MG TABS tablet TAKE 1 TABLET (5 MG TOTAL) BY MOUTH 2 (TWO) TIMES DAILY. 60 tablet 9  . furosemide (LASIX) 20 MG tablet TAKE 1 TABLET (20 MG TOTAL) BY MOUTH DAILY. 90 tablet 3  . levothyroxine (SYNTHROID, LEVOTHROID) 25 MCG tablet Take 1 tablet (25 mcg total) by mouth daily. 90 tablet 0  . metoprolol succinate (TOPROL-XL) 25 MG 24 hr tablet Take 1 tablet (25 mg total) by mouth daily. 90 tablet 3   No current facility-administered medications on file prior to visit.     BP (!) 160/72 (BP Location: Right Arm, Patient Position: Sitting, Cuff Size: Normal)   Pulse (!) 54   Temp 98 F (36.7 C) (Oral)   Wt 188 lb (85.3 kg)   SpO2 95%   BMI 29.67 kg/m     Review of Systems  Constitutional: Negative.   HENT: Negative for congestion, dental problem, hearing loss,  rhinorrhea, sinus pressure, sore throat and tinnitus.   Eyes: Negative for pain, discharge and visual disturbance.  Respiratory: Negative for cough and shortness of breath.   Cardiovascular: Negative for chest pain, palpitations and leg swelling.  Gastrointestinal: Negative for abdominal distention, abdominal pain, blood in stool, constipation, diarrhea, nausea and vomiting.  Genitourinary: Negative for difficulty urinating, dysuria, flank pain, frequency, hematuria, pelvic pain, urgency, vaginal bleeding, vaginal discharge and vaginal pain.  Musculoskeletal: Negative for arthralgias, gait problem and joint swelling.  Skin: Negative for rash.  Neurological: Negative for dizziness, syncope, speech difficulty, weakness, numbness and headaches.  Hematological: Negative for adenopathy.  Psychiatric/Behavioral: Negative for agitation, behavioral problems and dysphoric mood. The patient is not nervous/anxious.        Objective:   Physical Exam  Constitutional: She is oriented to person, place, and time. She appears well-developed and well-nourished.  Blood pressure 142/72  HENT:  Head: Normocephalic.  Right Ear: External ear normal.  Left Ear: External ear normal.  Mouth/Throat: Oropharynx is clear and moist.  Eyes: Conjunctivae and EOM are normal. Pupils are equal, round, and reactive to light.  Neck: Normal range of motion. Neck supple. No thyromegaly present.  Cardiovascular: Normal rate, regular rhythm, normal heart sounds and intact distal pulses.  Rhythm is regular  Pulmonary/Chest: Effort normal and breath sounds normal. She has no rales.  Abdominal: Soft. Bowel sounds are normal. She exhibits no mass. There is no tenderness.  Musculoskeletal: Normal range of motion.  Lymphadenopathy:    She has no cervical adenopathy.  Neurological: She is alert and oriented to person, place, and time.  Skin: Skin is warm and dry. No rash noted.  Psychiatric: She has a normal mood and affect. Her  behavior is normal.          Assessment & Plan:  Essential hypertension. No change in medical regimen.  Will continue on  amlodipine 5 mg.  May need to increase to 10 mg paroxysmal  atrial fibrillation.  Presently normal sinus rhythm follow-up 3 months   [name]

## 2017-04-07 NOTE — Patient Instructions (Signed)
Limit your sodium (Salt) intake  Please check your blood pressure on a regular basis.  If it is consistently greater than 150/90, please make an office appointment.  Return in 3 months for follow-up  

## 2017-04-21 ENCOUNTER — Other Ambulatory Visit: Payer: Self-pay

## 2017-04-21 MED ORDER — ATORVASTATIN CALCIUM 20 MG PO TABS: ORAL_TABLET | ORAL | 0 refills | Status: DC

## 2017-04-28 ENCOUNTER — Encounter (HOSPITAL_COMMUNITY): Payer: Commercial Managed Care - HMO

## 2017-04-28 ENCOUNTER — Ambulatory Visit: Payer: Commercial Managed Care - HMO | Admitting: Vascular Surgery

## 2017-06-02 NOTE — Progress Notes (Signed)
HPI: FU syncope and atrial fibrillation. Patient with history of syncope felt secondary to autonomic dysfunction. Previously loop monitor considered; Dr Lovena Le felt it would be indicated if recurrent syncope. H/O pulmonary embolus; seen by pulmonary and chronic anticoagulation felt indicated. Peripheral vascular disease followed by Dr. Scot Dock.  Echocardiogram November 2018 showed normal LV function, biatrial enlargement and moderate tricuspid regurgitation.  Holter monitor November 2018 showed atrial fibrillation with PVCs or aberrantly conducted beats rate controlled.    Patient previously placed on amiodarone and underwent successful cardioversion to sinus rhythm February 2019.  Since last seen,  patient does have some dyspnea on exertion but denies orthopnea or PND.  Chronic mild pedal edema.  No chest pain or syncope.  No bleeding.  Current Outpatient Medications  Medication Sig Dispense Refill  . acetaminophen (TYLENOL) 500 MG tablet Take 500 mg by mouth every 6 (six) hours as needed for moderate pain or headache.    Marland Kitchen amiodarone (PACERONE) 200 MG tablet Take 200 mg by mouth daily.    Marland Kitchen amLODipine (NORVASC) 5 MG tablet Take 1 tablet (5 mg total) by mouth daily. 30 tablet 3  . atorvastatin (LIPITOR) 20 MG tablet TAKE 1 TABLET BY MOUTH DAILY AT 6 PM. 90 tablet 0  . ELIQUIS 5 MG TABS tablet TAKE 1 TABLET (5 MG TOTAL) BY MOUTH 2 (TWO) TIMES DAILY. 60 tablet 9  . furosemide (LASIX) 20 MG tablet TAKE 1 TABLET (20 MG TOTAL) BY MOUTH DAILY. 90 tablet 3  . levothyroxine (SYNTHROID, LEVOTHROID) 25 MCG tablet Take 1 tablet (25 mcg total) by mouth daily. 90 tablet 0  . metoprolol succinate (TOPROL-XL) 25 MG 24 hr tablet Take 1 tablet (25 mg total) by mouth daily. 90 tablet 3   No current facility-administered medications for this visit.      Past Medical History:  Diagnosis Date  . BREAST BIOPSY, HX OF 06/10/2006  . Breast cyst   . HIP PAIN, LEFT 01/31/2007  . HYPERLIPIDEMIA 06/10/2006  .  HYPERTENSION 06/10/2006  . Impaired glucose tolerance   . NEOPLASMS UNSPEC NATURE BONE SOFT TISSUE&SKIN 05/31/2008  . OSTEOPOROSIS 06/10/2006  . Pulmonary embolus [facility])     Past Surgical History:  Procedure Laterality Date  . ABDOMINAL HYSTERECTOMY    . APPENDECTOMY    . BREAST SURGERY     bx x2  . CARDIOVERSION N/A 03/11/2017   Procedure: CARDIOVERSION;  Surgeon: Sanda Klein, MD;  Location: [facility];  Service: Cardiovascular;  Laterality: N/A;  . CHOLECYSTECTOMY  1980    Social History   Socioeconomic History  . Marital status: Married    Spouse name: Not on file  . Number of children: 4  . Years of education: Not on file  . Highest education level: Not on file  Occupational History  . Not on file  Social Needs  . Financial resource strain: Not on file  . Food insecurity:    Worry: Not on file    Inability: Not on file  . Transportation needs:    Medical: Not on file    Non-medical: Not on file  Tobacco Use  . Smoking status: Never Smoker  . Smokeless tobacco: Current User    Types: Snuff  Substance and Sexual Activity  . Alcohol use: No    Alcohol/week: 0.0 oz  . Drug use: No  . Sexual activity: Yes  Lifestyle  . Physical activity:    Days per week: Not on file    Minutes per session: Not on  file  . Stress: Not on file  Relationships  . Social connections:    Talks on phone: Not on file    Gets together: Not on file    Attends religious service: Not on file    Active member of club or organization: Not on file    Attends meetings of clubs or organizations: Not on file    Relationship status: Not on file  . Intimate partner violence:    Fear of current or ex partner: Not on file    Emotionally abused: Not on file    Physically abused: Not on file    Forced sexual activity: Not on file  Other Topics Concern  . Not on file  Social History Narrative  . Not on file    Family History  Problem Relation Age of Onset  . Heart disease Mother         Pacemaker  . Heart disease Father   . Asthma Father   . Diabetes Father   . Cancer Maternal Aunt        breast  . Cancer Sister   . Heart disease Sister   . Hyperlipidemia Sister   . Hypertension Sister     ROS: no fevers or chills, productive cough, hemoptysis, dysphasia, odynophagia, melena, hematochezia, dysuria, hematuria, rash, seizure activity, orthopnea, PND, claudication. Remaining systems are negative.  Physical Exam: Well-developed well-nourished in no acute distress.  Skin is warm and dry.  HEENT is normal.  Neck is supple.  Chest is clear to auscultation with normal expansion.  Cardiovascular exam is regular rate and rhythm.  Abdominal exam nontender or distended. No masses palpated. Extremities show 1+ ankle edema and varicosities. neuro grossly intact  A/P  1 paroxysmal atrial fibrillation-patient remains in sinus rhythm status post cardioversion.  Continue amiodarone.  Check TSH, liver functions and chest x-ray.  Continue apixaban.  Check hemoglobin and renal function.  2 history of pulmonary embolus-pulmonary felt long-term anticoagulation indicated.  Continue apixaban.  3 hypertension-blood pressure is elevated.  Increase amlodipine to 10 mg daily and follow.  4 history of syncope-no recurrent episodes.  Can consider loop monitor in the future if she has recurrent events.  5 hyperlipidemia-continue statin.  6 peripheral vascular disease-followed by vascular surgery.  She is not on aspirin given need for apixaban.  Continue statin.  7 tricuspid regurgitation-moderate on most recent echocardiogram.  Kirk Ruths, MD

## 2017-06-12 ENCOUNTER — Other Ambulatory Visit: Payer: Self-pay | Admitting: Internal Medicine

## 2017-06-14 ENCOUNTER — Ambulatory Visit: Payer: Medicare HMO | Admitting: Cardiology

## 2017-06-14 ENCOUNTER — Ambulatory Visit
Admission: RE | Admit: 2017-06-14 | Discharge: 2017-06-14 | Disposition: A | Discharge status: Home / Self Care | Payer: Medicare HMO | Source: Ambulatory Visit | Attending: Cardiology | Admitting: Cardiology

## 2017-06-14 ENCOUNTER — Encounter: Payer: Self-pay | Admitting: Cardiology

## 2017-06-14 VITALS — BP 148/70 | HR 59 | Ht 66.75 in | Wt 188.0 lb

## 2017-06-14 DIAGNOSIS — I480 Paroxysmal atrial fibrillation: Secondary | ICD-10-CM

## 2017-06-14 DIAGNOSIS — E785 Hyperlipidemia, unspecified: Secondary | ICD-10-CM

## 2017-06-14 DIAGNOSIS — I10 Essential (primary) hypertension: Secondary | ICD-10-CM

## 2017-06-14 DIAGNOSIS — I739 Peripheral vascular disease, unspecified: Secondary | ICD-10-CM

## 2017-06-14 DIAGNOSIS — R0602 Shortness of breath: Secondary | ICD-10-CM | POA: Diagnosis not present

## 2017-06-14 LAB — BASIC METABOLIC PANEL
BUN/Creatinine Ratio: 26 (ref 12–28)
BUN: 38 mg/dL — AB (ref 8–27)
CHLORIDE: 103 mmol/L (ref 96–106)
CO2: 20 mmol/L (ref 20–29)
Calcium: 10.3 mg/dL (ref 8.7–10.3)
Creatinine, Ser: 1.46 mg/dL — ABNORMAL HIGH (ref 0.57–1.00)
GFR calc non Af Amer: 33 mL/min/{1.73_m2} — ABNORMAL LOW (ref >59)
GFR, EST AFRICAN AMERICAN: 38 mL/min/{1.73_m2} — AB (ref >59)
Glucose: 151 mg/dL — ABNORMAL HIGH (ref 65–99)
Potassium: 5.1 mmol/L (ref 3.5–5.2)
SODIUM: 141 mmol/L (ref 134–144)

## 2017-06-14 LAB — HEPATIC FUNCTION PANEL
ALK PHOS: 77 IU/L (ref 39–117)
ALT: 18 IU/L (ref 0–32)
AST: 30 IU/L (ref 0–40)
Albumin: 4.5 g/dL (ref 3.5–4.7)
BILIRUBIN, DIRECT: 0.19 mg/dL (ref 0.00–0.40)
Bilirubin Total: 0.6 mg/dL (ref 0.0–1.2)
Total Protein: 7.4 g/dL (ref 6.0–8.5)

## 2017-06-14 LAB — CBC
Hematocrit: 44.2 % (ref 34.0–46.6)
Hemoglobin: 15.1 g/dL (ref 11.1–15.9)
MCH: 31.6 pg (ref 26.6–33.0)
MCHC: 34.2 g/dL (ref 31.5–35.7)
MCV: 93 fL (ref 79–97)
PLATELETS: 267 10*3/uL (ref 150–450)
RBC: 4.78 x10E6/uL (ref 3.77–5.28)
RDW: 15.3 % (ref 12.3–15.4)
WBC: 6.1 10*3/uL (ref 3.4–10.8)

## 2017-06-14 LAB — TSH: TSH: 4.15 u[IU]/mL (ref 0.450–4.500)

## 2017-06-14 MED ORDER — AMLODIPINE BESYLATE 10 MG PO TABS: 10.0000 mg | ORAL_TABLET | Freq: Every day | ORAL | 6 refills | Status: DC

## 2017-06-14 NOTE — Patient Instructions (Addendum)
Medication Instructions:  INCREASE Norvasc to 10mg  Take 1 tablet once a day  Labwork: Your physician recommends that you return for lab work in: TODAY-CBC, TSH, LFT, BMET  Testing/Procedures: A chest x-ray takes a picture of the organs and structures inside the chest, including the heart, lungs, and blood vessels. This test can show several things, including, whether the heart is enlarges; whether fluid is building up in the lungs; and whether pacemaker / defibrillator leads are still in place. [facility] [phone]  Follow-Up: Your physician wants you to follow-up in: 6 months with Dr Stanford Breed. You will receive a reminder letter in the mail two months in advance. If you don't receive a letter, please call our office to schedule the follow-up appointment.   Any Other Special Instructions Will Be Listed Below (If Applicable).  If you need a refill on your cardiac medications before your next appointment, please call your pharmacy.

## 2017-06-15 ENCOUNTER — Telehealth: Payer: Self-pay | Admitting: *Deleted

## 2017-06-15 DIAGNOSIS — N289 Disorder of kidney and ureter, unspecified: Secondary | ICD-10-CM

## 2017-06-15 MED ORDER — FUROSEMIDE 20 MG PO TABS: 20.0000 mg | ORAL_TABLET | ORAL | 3 refills | Status: DC

## 2017-06-15 NOTE — Telephone Encounter (Addendum)
Spoke with pt, she repeated instructions regarding change in furosemide dose. Lab orders mailed to the pt    ----- Message from Lelon Perla, MD sent at 06/14/2017  5:11 PM EDT ----- Change lasix to 20 mg every other day and repeat bmet one week Kirk Ruths

## 2017-06-15 NOTE — Progress Notes (Signed)
[facility] [place] HISTORY AND PHYSICAL   History of Present Illness:  Patient is a 84 y.o. year old female who presents for evaluation of claudication.  This patient has evidence of infrainguinal arterial occlusive disease on the left and evidence of chronic venous insufficiency bilaterally.  Previous DVT of the left lower extremity and a pulmonary embolus, and has been on Eliquis.     She denies any rest pain or nonhealing ulcers.   He previous ABI's were right 100 % triphasic flow, left 65% monophasic flow and TBI 62 mmhg.  She denise non healing wounds, calf pain with short distance ambulation and no rest pain.  She has not had a change in her daily routine since her last visit a year ago.  Her and her husband do the shopping together and she is able to walk around the store without difficulty holding to the shopping cart for support.    Past medical history has not changed.  HTN,Pulmonary embolus managed with chronic Eliquis, A fib and dislidemia  Managed with Lipitor.          Past Medical History:  Diagnosis Date  . BREAST BIOPSY, HX OF 06/10/2006  . Breast cyst   . HIP PAIN, LEFT 01/31/2007  . HYPERLIPIDEMIA 06/10/2006  . HYPERTENSION 06/10/2006  . Impaired glucose tolerance   . NEOPLASMS UNSPEC NATURE BONE SOFT TISSUE&SKIN 05/31/2008  . OSTEOPOROSIS 06/10/2006  . Pulmonary embolus [facility])     Past Surgical History:  Procedure Laterality Date  . ABDOMINAL HYSTERECTOMY    . APPENDECTOMY    . BREAST SURGERY     bx x2  . CARDIOVERSION N/A 03/11/2017   Procedure: CARDIOVERSION;  Surgeon: Sanda Klein, MD;  Location: [facility];  Service: Cardiovascular;  Laterality: N/A;  . CHOLECYSTECTOMY  1980    ROS:   General:  No weight loss, Fever, chills  HEENT: No recent headaches, no nasal bleeding, no visual changes, no sore throat  Neurologic: No dizziness, blackouts, seizures. No recent symptoms of stroke or mini- stroke. No recent episodes of slurred  speech, or temporary blindness.  Cardiac: No recent episodes of chest pain/pressure, no shortness of breath at rest.  No shortness of breath with exertion.  Denies history of atrial fibrillation or irregular heartbeat  Vascular: No history of rest pain in feet.  No history of claudication.  No history of non-healing ulcer, No history of DVT   Pulmonary: No home oxygen, no productive cough, no hemoptysis,  No asthma or wheezing  Musculoskeletal:  [ ]  Arthritis, [ ]  Low back pain,  [ ]  Joint pain  Hematologic:No history of hypercoagulable state.  No history of easy bleeding.  No history of anemia  Gastrointestinal: No hematochezia or melena,  No gastroesophageal reflux, no trouble swallowing  Urinary: [ ]  chronic Kidney disease, [ ]  on HD - [ ]  MWF or [ ]  TTHS, [ ]  Burning with urination, [ ]  Frequent urination, [ ]  Difficulty urinating;   Skin: No rashes  Psychological: No history of anxiety,  No history of depression  Social History Social History   Tobacco Use  . Smoking status: Never Smoker  . Smokeless tobacco: Current User    Types: Snuff  Substance Use Topics  . Alcohol use: No    Alcohol/week: 0.0 oz  . Drug use: No    Family History Family History  Problem Relation Age of Onset  . Heart disease Mother        Pacemaker  . Heart disease  Father   . Asthma Father   . Diabetes Father   . Cancer Maternal Aunt        breast  . Cancer Sister   . Heart disease Sister   . Hyperlipidemia Sister   . Hypertension Sister     Allergies  Allergies  Allergen Reactions  . Tuberculin Tests Other (See Comments)    Tuberculosis shot, red spot on arm     Current Outpatient Medications  Medication Sig Dispense Refill  . acetaminophen (TYLENOL) 500 MG tablet Take 500 mg by mouth every 6 (six) hours as needed for moderate pain or headache.    Marland Kitchen amiodarone (PACERONE) 200 MG tablet Take 200 mg by mouth daily.    Marland Kitchen amLODipine (NORVASC) 10 MG tablet Take 1 tablet (10 mg total)  by mouth daily. 30 tablet 6  . atorvastatin (LIPITOR) 20 MG tablet TAKE 1 TABLET BY MOUTH DAILY AT 6 PM. 90 tablet 0  . ELIQUIS 5 MG TABS tablet TAKE 1 TABLET (5 MG TOTAL) BY MOUTH 2 (TWO) TIMES DAILY. 60 tablet 9  . furosemide (LASIX) 20 MG tablet Take 1 tablet (20 mg total) by mouth every other day. 90 tablet 3  . levothyroxine (SYNTHROID, LEVOTHROID) 25 MCG tablet TAKE 1 TABLET BY MOUTH EVERY DAY 90 tablet 0  . metoprolol succinate (TOPROL-XL) 25 MG 24 hr tablet Take 1 tablet (25 mg total) by mouth daily. 90 tablet 3   No current facility-administered medications for this visit.     Physical Examination  Vitals:   06/16/17 1337 06/16/17 1340  BP: (!) 147/79 (!) 141/74  Pulse: (!) 51 (!) 51  Resp: 16   Temp: (!) 97 F (36.1 C)   TempSrc: Oral   SpO2: 98%   Weight: 187 lb (84.8 kg)   Height: 5\' 7"  (1.702 m)     Body mass index is 29.29 kg/m.  General:  Alert and oriented, no acute distress HEENT: Normal, normocephalic Neck: No bruit or JVD Pulmonary: Clear to auscultation bilaterally Cardiac: Regular Rate and Rhythm without murmur Abdomen: Soft, non-tender, non-distended, no mass, no scars Skin: No rash Extremity Pulses:  2+ radial, brachial, femoral, right dorsalis pedis, posterior tibial pulses, non palpable left LE pedal pulses below the femoral.   Musculoskeletal: positive B LE edema with chronic venous insufficiency changes.  Neurologic: Upper and lower extremity motor 5/5 and symmetric  DATA:  Previous ABI's 04/22/2016  On the right side, there is a triphasic posterior tibial signal and a triphasic dorsalis pedis signal. ABI is 100%. Toe pressure is 142 mmHg.  On the left side, there is a monophasic dorsalis pedis and posterior tibial signal. ABI 65%. Toe pressure on the left is 62 mmHg.  06/16/2017 ABI Right 99% biphasic flow Left 48 % monophasic flow.  ASSESSMENT: PAD with infrainguinal disease on the left LE.   PLAN:  She has no history of non healing  wounds and is able to care out he ADL.  I have encouraged her to elevate B LE when at rest to help with the chronic edema.  We also discussed the importance of activity daily to maintain her current arterial flow in the left LE.    We reviewed the symptoms of non healing wound development and or rest pain would be a reason to call us.  I have scheduled her a 6 month f/u with repeat ABI's.  She has had a small decrease in her ABI's on the left LE from her previous study.  Roxy Horseman PA-C [facility]  MD in clinic Dr. Scot Dock

## 2017-06-16 ENCOUNTER — Other Ambulatory Visit: Payer: Self-pay

## 2017-06-16 ENCOUNTER — Ambulatory Visit (INDEPENDENT_AMBULATORY_CARE_PROVIDER_SITE_OTHER): Payer: Medicare HMO | Admitting: Physician Assistant

## 2017-06-16 ENCOUNTER — Ambulatory Visit (HOSPITAL_COMMUNITY)
Admission: RE | Admit: 2017-06-16 | Discharge: 2017-06-16 | Disposition: A | Discharge status: Home / Self Care | Payer: Medicare HMO | Source: Ambulatory Visit | Attending: Vascular Surgery | Admitting: Vascular Surgery

## 2017-06-16 ENCOUNTER — Telehealth: Payer: Self-pay | Admitting: Internal Medicine

## 2017-06-16 VITALS — BP 141/74 | HR 51 | Temp 97.0°F | Resp 16 | Ht 67.0 in | Wt 187.0 lb

## 2017-06-16 DIAGNOSIS — I10 Essential (primary) hypertension: Secondary | ICD-10-CM | POA: Insufficient documentation

## 2017-06-16 DIAGNOSIS — E785 Hyperlipidemia, unspecified: Secondary | ICD-10-CM | POA: Insufficient documentation

## 2017-06-16 DIAGNOSIS — I739 Peripheral vascular disease, unspecified: Secondary | ICD-10-CM

## 2017-06-16 DIAGNOSIS — R0989 Other specified symptoms and signs involving the circulatory and respiratory systems: Secondary | ICD-10-CM | POA: Insufficient documentation

## 2017-06-16 DIAGNOSIS — R9389 Abnormal findings on diagnostic imaging of other specified body structures: Secondary | ICD-10-CM | POA: Diagnosis not present

## 2017-06-16 NOTE — Telephone Encounter (Signed)
Please advise 

## 2017-06-16 NOTE — Telephone Encounter (Signed)
Prednisone called into pharmacy 

## 2017-06-16 NOTE — Telephone Encounter (Signed)
Prednisone Dosepak 5 mg 6-day.  Take as directed

## 2017-06-16 NOTE — Telephone Encounter (Signed)
Copied from [place] [id number]. Topic: Inquiry >> Jun 16, 2017 11:25 AM Margot Ables wrote: Reason for CRM: pt called stating she has poison oak/ivy from her knees to her head and is asking if Dr. Raliegh Ip will call something in. Pt has appt with vein doctor this afternoon and declined appt today. Ok to leave detailed msg.  [id number] [address], [place] [phone] (Phone) [phone] (Fax)

## 2017-06-23 DIAGNOSIS — N289 Disorder of kidney and ureter, unspecified: Secondary | ICD-10-CM | POA: Diagnosis not present

## 2017-06-23 LAB — BASIC METABOLIC PANEL
BUN/Creatinine Ratio: 23 (ref 12–28)
BUN: 31 mg/dL — AB (ref 8–27)
CO2: 22 mmol/L (ref 20–29)
CREATININE: 1.32 mg/dL — AB (ref 0.57–1.00)
Calcium: 9.9 mg/dL (ref 8.7–10.3)
Chloride: 104 mmol/L (ref 96–106)
GFR, EST AFRICAN AMERICAN: 43 mL/min/{1.73_m2} — AB (ref >59)
GFR, EST NON AFRICAN AMERICAN: 37 mL/min/{1.73_m2} — AB (ref >59)
GLUCOSE: 108 mg/dL — AB (ref 65–99)
Potassium: 4.9 mmol/L (ref 3.5–5.2)
Sodium: 143 mmol/L (ref 134–144)

## 2017-06-24 ENCOUNTER — Encounter: Payer: Self-pay | Admitting: *Deleted

## 2017-06-24 ENCOUNTER — Other Ambulatory Visit: Payer: Self-pay | Admitting: Internal Medicine

## 2017-07-08 ENCOUNTER — Other Ambulatory Visit: Payer: Self-pay

## 2017-07-08 ENCOUNTER — Ambulatory Visit: Payer: Medicare HMO | Admitting: Internal Medicine

## 2017-07-08 DIAGNOSIS — I739 Peripheral vascular disease, unspecified: Secondary | ICD-10-CM

## 2017-07-12 ENCOUNTER — Ambulatory Visit (INDEPENDENT_AMBULATORY_CARE_PROVIDER_SITE_OTHER): Payer: Medicare HMO | Admitting: Internal Medicine

## 2017-07-12 ENCOUNTER — Encounter: Payer: Self-pay | Admitting: Internal Medicine

## 2017-07-12 ENCOUNTER — Other Ambulatory Visit: Payer: Self-pay

## 2017-07-12 VITALS — BP 142/60 | Temp 98.2°F | Wt 191.0 lb

## 2017-07-12 DIAGNOSIS — I739 Peripheral vascular disease, unspecified: Secondary | ICD-10-CM

## 2017-07-12 DIAGNOSIS — I10 Essential (primary) hypertension: Secondary | ICD-10-CM

## 2017-07-12 DIAGNOSIS — I480 Paroxysmal atrial fibrillation: Secondary | ICD-10-CM | POA: Diagnosis not present

## 2017-07-12 MED ORDER — FUROSEMIDE 20 MG PO TABS: 20.0000 mg | ORAL_TABLET | Freq: Every day | ORAL | 3 refills | Status: DC

## 2017-07-12 NOTE — Patient Instructions (Signed)
Limit your sodium (Salt) intake  Please check your blood pressure on a regular basis.  If it is consistently greater than 140/90, please make an office appointment.  Cardiology follow-up as scheduled  Furosemide 20 mg daily

## 2017-07-12 NOTE — Progress Notes (Signed)
Subjective:    Patient ID: Tammy [name] Age, female    DOB: [birth year], 84 y.o.   MRN: [id number]  HPI  84 year old patient who is seen today for follow-up.  She is followed by cardiology with a history of paroxysmal atrial fibrillation.  She is also followed by vascular surgery with a history of PAD with abnormal ABIs involving the left leg.  Denies any claudication. She has had increasing pedal edema since furosemide has been changed to a every other day regimen.  Clinically doing well no concerns or complaints  Past Medical History:  Diagnosis Date  . BREAST BIOPSY, HX OF 06/10/2006  . Breast cyst   . HIP PAIN, LEFT 01/31/2007  . HYPERLIPIDEMIA 06/10/2006  . HYPERTENSION 06/10/2006  . Impaired glucose tolerance   . NEOPLASMS UNSPEC NATURE BONE SOFT TISSUE&SKIN 05/31/2008  . OSTEOPOROSIS 06/10/2006  . Pulmonary embolus [facility])      Social History   Socioeconomic History  . Marital status: Married    Spouse name: Not on file  . Number of children: 4  . Years of education: Not on file  . Highest education level: Not on file  Occupational History  . Not on file  Social Needs  . Financial resource strain: Not on file  . Food insecurity:    Worry: Not on file    Inability: Not on file  . Transportation needs:    Medical: Not on file    Non-medical: Not on file  Tobacco Use  . Smoking status: Never Smoker  . Smokeless tobacco: Current User    Types: Snuff  Substance and Sexual Activity  . Alcohol use: No    Alcohol/week: 0.0 oz  . Drug use: No  . Sexual activity: Yes  Lifestyle  . Physical activity:    Days per week: Not on file    Minutes per session: Not on file  . Stress: Not on file  Relationships  . Social connections:    Talks on phone: Not on file    Gets together: Not on file    Attends religious service: Not on file    Active member of club or organization: Not on file    Attends meetings of clubs or organizations: Not on file    Relationship status: Not on file    . Intimate partner violence:    Fear of current or ex partner: Not on file    Emotionally abused: Not on file    Physically abused: Not on file    Forced sexual activity: Not on file  Other Topics Concern  . Not on file  Social History Narrative  . Not on file    Past Surgical History:  Procedure Laterality Date  . ABDOMINAL HYSTERECTOMY    . APPENDECTOMY    . BREAST SURGERY     bx x2  . CARDIOVERSION N/A 03/11/2017   Procedure: CARDIOVERSION;  Surgeon: Sanda Klein, MD;  Location: [facility];  Service: Cardiovascular;  Laterality: N/A;  . CHOLECYSTECTOMY  1980    Family History  Problem Relation Age of Onset  . Heart disease Mother        Pacemaker  . Heart disease Father   . Asthma Father   . Diabetes Father   . Cancer Maternal Aunt        breast  . Cancer Sister   . Heart disease Sister   . Hyperlipidemia Sister   . Hypertension Sister     Allergies  Allergen Reactions  . Tuberculin Tests  Other (See Comments)    Tuberculosis shot, red spot on arm    Current Outpatient Medications on File Prior to Visit  Medication Sig Dispense Refill  . acetaminophen (TYLENOL) 500 MG tablet Take 500 mg by mouth every 6 (six) hours as needed for moderate pain or headache.    Marland Kitchen amiodarone (PACERONE) 200 MG tablet Take 200 mg by mouth daily.    Marland Kitchen amLODipine (NORVASC) 10 MG tablet Take 1 tablet (10 mg total) by mouth daily. 30 tablet 6  . atorvastatin (LIPITOR) 20 MG tablet TAKE 1 TABLET BY MOUTH DAILY AT 6 PM. 90 tablet 0  . ELIQUIS 5 MG TABS tablet TAKE 1 TABLET (5 MG TOTAL) BY MOUTH 2 (TWO) TIMES DAILY. 60 tablet 9  . furosemide (LASIX) 20 MG tablet Take 1 tablet (20 mg total) by mouth every other day. 90 tablet 3  . levothyroxine (SYNTHROID, LEVOTHROID) 25 MCG tablet TAKE 1 TABLET BY MOUTH EVERY DAY 90 tablet 0  . metoprolol succinate (TOPROL-XL) 25 MG 24 hr tablet Take 1 tablet (25 mg total) by mouth daily. 90 tablet 3   No current facility-administered medications on  file prior to visit.     BP (!) 142/60 (BP Location: Right Arm, Patient Position: Sitting, Cuff Size: Large)   Temp 98.2 F (36.8 C) (Oral)   Wt 191 lb (86.6 kg)   BMI 29.91 kg/m    Review of Systems  Constitutional: Negative.   HENT: Negative for congestion, dental problem, hearing loss, rhinorrhea, sinus pressure, sore throat and tinnitus.   Eyes: Negative for pain, discharge and visual disturbance.  Respiratory: Negative for cough and shortness of breath.   Cardiovascular: Positive for leg swelling. Negative for chest pain and palpitations.  Gastrointestinal: Negative for abdominal distention, abdominal pain, blood in stool, constipation, diarrhea, nausea and vomiting.  Genitourinary: Negative for difficulty urinating, dysuria, flank pain, frequency, hematuria, pelvic pain, urgency, vaginal bleeding, vaginal discharge and vaginal pain.  Musculoskeletal: Negative for arthralgias, gait problem and joint swelling.  Skin: Negative for rash.  Neurological: Negative for dizziness, syncope, speech difficulty, weakness, numbness and headaches.  Hematological: Negative for adenopathy.  Psychiatric/Behavioral: Negative for agitation, behavioral problems and dysphoric mood. The patient is not nervous/anxious.        Objective:   Physical Exam  Constitutional: She is oriented to person, place, and time. She appears well-developed and well-nourished.  HENT:  Head: Normocephalic.  Right Ear: External ear normal.  Left Ear: External ear normal.  Mouth/Throat: Oropharynx is clear and moist.  Eyes: Pupils are equal, round, and reactive to light. Conjunctivae and EOM are normal.  Neck: Normal range of motion. Neck supple. No thyromegaly present.  Cardiovascular: Normal rate, regular rhythm, normal heart sounds and intact distal pulses.  Pulmonary/Chest: Effort normal and breath sounds normal.  Abdominal: Soft. Bowel sounds are normal. She exhibits no mass. There is no tenderness.    Musculoskeletal: Normal range of motion. She exhibits edema.  Ankle and pedal edema right greater than the left  Lymphadenopathy:    She has no cervical adenopathy.  Neurological: She is alert and oriented to person, place, and time.  Skin: Skin is warm and dry. No rash noted.  Psychiatric: She has a normal mood and affect. Her behavior is normal.          Assessment & Plan:   Hypertension well-controlled PAF stable.  Continue chronic anticoagulation as well as amiodarone Pedal edema. low-salt diet recommended.  Will resume daily of furosemide.  Consider dose reduction  of amlodipine if persists  Follow-up 3 to 6 months Cardiology follow-up as scheduled  Marletta Lor

## 2017-08-08 ENCOUNTER — Other Ambulatory Visit: Payer: Self-pay | Admitting: Internal Medicine

## 2017-08-28 ENCOUNTER — Encounter (HOSPITAL_COMMUNITY): Payer: Self-pay | Admitting: Emergency Medicine

## 2017-08-28 ENCOUNTER — Emergency Department (HOSPITAL_COMMUNITY)
Admission: EM | Admit: 2017-08-28 | Discharge: 2017-08-28 | Disposition: A | Discharge status: Home / Self Care | Payer: Medicare HMO | Attending: Emergency Medicine | Admitting: Emergency Medicine

## 2017-08-28 ENCOUNTER — Emergency Department (HOSPITAL_COMMUNITY): Payer: Medicare HMO

## 2017-08-28 DIAGNOSIS — Z7901 Long term (current) use of anticoagulants: Secondary | ICD-10-CM | POA: Insufficient documentation

## 2017-08-28 DIAGNOSIS — S0003XA Contusion of scalp, initial encounter: Secondary | ICD-10-CM | POA: Insufficient documentation

## 2017-08-28 DIAGNOSIS — Y998 Other external cause status: Secondary | ICD-10-CM | POA: Insufficient documentation

## 2017-08-28 DIAGNOSIS — Y929 Unspecified place or not applicable: Secondary | ICD-10-CM | POA: Insufficient documentation

## 2017-08-28 DIAGNOSIS — I10 Essential (primary) hypertension: Secondary | ICD-10-CM | POA: Insufficient documentation

## 2017-08-28 DIAGNOSIS — Z9049 Acquired absence of other specified parts of digestive tract: Secondary | ICD-10-CM | POA: Diagnosis not present

## 2017-08-28 DIAGNOSIS — W01198A Fall on same level from slipping, tripping and stumbling with subsequent striking against other object, initial encounter: Secondary | ICD-10-CM | POA: Insufficient documentation

## 2017-08-28 DIAGNOSIS — Z79899 Other long term (current) drug therapy: Secondary | ICD-10-CM | POA: Insufficient documentation

## 2017-08-28 DIAGNOSIS — S0990XA Unspecified injury of head, initial encounter: Secondary | ICD-10-CM | POA: Diagnosis not present

## 2017-08-28 DIAGNOSIS — Y9389 Activity, other specified: Secondary | ICD-10-CM | POA: Insufficient documentation

## 2017-08-28 DIAGNOSIS — S0101XA Laceration without foreign body of scalp, initial encounter: Secondary | ICD-10-CM | POA: Diagnosis not present

## 2017-08-28 MED ORDER — LIDOCAINE-EPINEPHRINE-TETRACAINE (LET) SOLUTION
3.0000 mL | Freq: Once | NASAL | Status: DC
  Filled 2017-08-28: qty 3

## 2017-08-28 NOTE — Discharge Instructions (Addendum)
Apply ice to help with the swelling, you can take over-the-counter medications as needed for pain, return for severe headache, vomiting , or confusion

## 2017-08-28 NOTE — ED Triage Notes (Signed)
Pt presents to Ed for assessment after a trip and fall where she hit her head on the gas pipe of the external generator.  Small laceration to back right of head noted.  Pt is on eliquis.  Denies LOC.  Neuro assessment WDL in triage.

## 2017-08-28 NOTE — ED Provider Notes (Signed)
[facility] EMERGENCY DEPARTMENT Provider Note   CSN: [id number] Arrival date & time: 08/28/17  1631     History   Chief Complaint Chief Complaint  Patient presents with  . Fall  . Head Injury  . blood thinners    HPI Tammy [name] is a 84 y.o. female.  HPI Pt states she was walking today when she stumbled and fell.  She struck her head on the gas line next to the generator..  Pt takes blood thinning medication and came to the ED as a precaution.  SHe is having pain on the back of her head.  NO LOC.  Pt has been able to get up and walk after being helped up.   Past Medical History:  Diagnosis Date  . BREAST BIOPSY, HX OF 06/10/2006  . Breast cyst   . HIP PAIN, LEFT 01/31/2007  . HYPERLIPIDEMIA 06/10/2006  . HYPERTENSION 06/10/2006  . Impaired glucose tolerance   . NEOPLASMS UNSPEC NATURE BONE SOFT TISSUE&SKIN 05/31/2008  . OSTEOPOROSIS 06/10/2006  . Pulmonary embolus [facility])     Patient Active Problem List   Diagnosis Date Noted  . Atrial fibrillation ([place]) 03/21/2014  . Embolism, pulmonary with infarction ([place]) 03/08/2014  . Severe tricuspid regurgitation by prior echocardiogram 02/23/2014  . Pulmonary hypertension ([place]) 02/23/2014  . Syncope and collapse 02/23/2014  . Pain in limb 06/07/2013  . PAD (peripheral artery disease) ([place]) 06/01/2013  . Syncope 04/02/2012  . NEOPLASMS UNSPEC NATURE BONE SOFT TISSUE&SKIN 05/31/2008  . Dyslipidemia 06/10/2006  . Essential hypertension 06/10/2006  . Osteoporosis 06/10/2006  . BREAST BIOPSY, HX OF 06/10/2006    Past Surgical History:  Procedure Laterality Date  . ABDOMINAL HYSTERECTOMY    . APPENDECTOMY    . BREAST SURGERY     bx x2  . CARDIOVERSION N/A 03/11/2017   Procedure: CARDIOVERSION;  Surgeon: Sanda Klein, MD;  Location: [facility];  Service: Cardiovascular;  Laterality: N/A;  . CHOLECYSTECTOMY  1980     OB History   None      Home Medications    Prior to Admission medications     Medication Sig Start Date End Date Taking? Authorizing Provider  acetaminophen (TYLENOL) 500 MG tablet Take 500 mg by mouth every 6 (six) hours as needed for moderate pain or headache.    [provider]  amiodarone (PACERONE) 200 MG tablet Take 200 mg by mouth daily.    [provider]  amLODipine (NORVASC) 10 MG tablet Take 1 tablet (10 mg total) by mouth daily. 06/14/17 10/12/17  Lelon Perla, MD  atorvastatin (LIPITOR) 20 MG tablet TAKE 1 TABLET BY MOUTH DAILY AT 6 PM. 08/09/17   Marletta Lor, MD  ELIQUIS 5 MG TABS tablet TAKE 1 TABLET (5 MG TOTAL) BY MOUTH 2 (TWO) TIMES DAILY. 08/09/17   Marletta Lor, MD  furosemide (LASIX) 20 MG tablet Take 1 tablet (20 mg total) by mouth daily. 07/12/17   Marletta Lor, MD  levothyroxine (SYNTHROID, LEVOTHROID) 25 MCG tablet TAKE 1 TABLET BY MOUTH EVERY DAY 06/15/17   Marletta Lor, MD  metoprolol succinate (TOPROL-XL) 25 MG 24 hr tablet Take 1 tablet (25 mg total) by mouth daily. 03/24/17   Marletta Lor, MD    Family History Family History  Problem Relation Age of Onset  . Heart disease Mother        Pacemaker  . Heart disease Father   . Asthma Father   . Diabetes Father   .  Cancer Maternal Aunt        breast  . Cancer Sister   . Heart disease Sister   . Hyperlipidemia Sister   . Hypertension Sister     Social History Social History   Tobacco Use  . Smoking status: Never Smoker  . Smokeless tobacco: Current User    Types: Snuff  Substance Use Topics  . Alcohol use: No    Alcohol/week: 0.0 oz  . Drug use: No     Allergies   Tuberculin tests   Review of Systems Review of Systems  All other systems reviewed and are negative.    Physical Exam Updated Vital Signs BP (!) 153/59   Pulse 64   Temp 97.8 F (36.6 C) (Oral)   Resp 18   Ht 1.702 m (5\' 7" )   Wt 81.6 kg (180 lb)   SpO2 96%   BMI 28.19 kg/m   Physical Exam  Constitutional: She appears well-developed and  well-nourished. No distress.  HENT:  Head: Normocephalic.  Right Ear: External ear normal.  Left Ear: External ear normal.  Dried blood posterior scalp, hematoma occiput  Eyes: Conjunctivae are normal. Right eye exhibits no discharge. Left eye exhibits no discharge. No scleral icterus.  Neck: Neck supple. No tracheal deviation present.  Cardiovascular: Normal rate, regular rhythm and intact distal pulses.  Pulmonary/Chest: Effort normal and breath sounds normal. No stridor. No respiratory distress. She has no wheezes. She has no rales.  Abdominal: Soft. Bowel sounds are normal. She exhibits no distension. There is no tenderness. There is no rebound and no guarding.  Musculoskeletal: She exhibits edema. She exhibits no tenderness.       Right shoulder: She exhibits no tenderness, no bony tenderness and no swelling.       Left shoulder: She exhibits no tenderness, no bony tenderness and no swelling.       Right wrist: She exhibits no tenderness, no bony tenderness and no swelling.       Left wrist: She exhibits no tenderness, no bony tenderness and no swelling.       Right hip: She exhibits normal range of motion, no tenderness, no bony tenderness and no swelling.       Left hip: She exhibits normal range of motion, no tenderness and no bony tenderness.       Right ankle: She exhibits no swelling. No tenderness.       Left ankle: She exhibits no swelling. No tenderness.       Cervical back: She exhibits no tenderness, no bony tenderness and no swelling.       Thoracic back: She exhibits no tenderness, no bony tenderness and no swelling.       Lumbar back: She exhibits no tenderness, no bony tenderness and no swelling.  Neurological: She is alert. She has normal strength. No cranial nerve deficit (no facial droop, extraocular movements intact, no slurred speech) or sensory deficit. She exhibits normal muscle tone. She displays no seizure activity. Coordination normal.  Skin: Skin is warm and  dry. No rash noted.  Psychiatric: She has a normal mood and affect.  Nursing note and vitals reviewed.    ED Treatments / Results  Labs (all labs ordered are listed, but only abnormal results are displayed) Labs Reviewed - No data to display  EKG None  Radiology Ct Head Wo Contrast  Result Date: 08/28/2017 CLINICAL DATA:  Fall, posterior head laceration EXAM: CT HEAD WITHOUT CONTRAST TECHNIQUE: Contiguous axial images were obtained from the  base of the skull through the vertex without intravenous contrast. COMPARISON:  02/23/2014 FINDINGS: Brain: No evidence of acute infarction, hemorrhage, hydrocephalus, extra-axial collection or mass lesion/mass effect. Mild age related atrophy. Vascular: Mild intracranial atherosclerosis. Skull: Normal. Negative for fracture or focal lesion. Sinuses/Orbits: The visualized paranasal sinuses are essentially clear. The mastoid air cells are unopacified. Other: Moderate extracranial hematoma overlying the right occipital bone, measuring 16 mm in thickness (series 3/image 15). IMPRESSION: Moderate extracranial hematoma overlying the right the occipital bone. No evidence of calvarial fracture. No evidence of acute intracranial abnormality. Mild cortical atrophy. Electronically Signed   By: Julian Hy M.D.   On: 08/28/2017 18:25    Procedures Procedures (including critical care time)  Medications Ordered in ED Medications  lidocaine-EPINEPHrine-tetracaine (LET) solution (3 mLs Topical Not Given 08/28/17 2005)     Initial Impression / Assessment and Plan / ED Course  I have reviewed the triage vital signs and the nursing notes.  Pertinent labs & imaging results that were available during my care of the patient were reviewed by me and considered in my medical decision making (see chart for details).   She presented to the emergency room for evaluation after a fall.  She is on anticoagulants.  CT scan shows a hematoma but no evidence of fracture or  intracranial bleeding.  Small amount of blood in the back of the patient's scalp was irrigated.  On repeat exam there does not appear to be any laceration requiring suturing.  She is stable for discharge  Final Clinical Impressions(s) / ED Diagnoses   Final diagnoses:  Contusion of scalp, initial encounter    ED Discharge Orders    None       Dorie Rank, MD 08/28/17 2012

## 2017-09-16 ENCOUNTER — Other Ambulatory Visit: Payer: Self-pay | Admitting: Internal Medicine

## 2017-10-12 ENCOUNTER — Ambulatory Visit (INDEPENDENT_AMBULATORY_CARE_PROVIDER_SITE_OTHER): Payer: Medicare HMO | Admitting: Adult Health

## 2017-10-12 ENCOUNTER — Encounter: Payer: Self-pay | Admitting: Adult Health

## 2017-10-12 ENCOUNTER — Ambulatory Visit (INDEPENDENT_AMBULATORY_CARE_PROVIDER_SITE_OTHER): Payer: Medicare HMO

## 2017-10-12 VITALS — BP 150/56 | Temp 98.1°F | Wt 193.0 lb

## 2017-10-12 DIAGNOSIS — I10 Essential (primary) hypertension: Secondary | ICD-10-CM | POA: Diagnosis not present

## 2017-10-12 DIAGNOSIS — M25552 Pain in left hip: Secondary | ICD-10-CM | POA: Diagnosis not present

## 2017-10-12 DIAGNOSIS — Z7689 Persons encountering health services in other specified circumstances: Secondary | ICD-10-CM | POA: Diagnosis not present

## 2017-10-12 DIAGNOSIS — R05 Cough: Secondary | ICD-10-CM | POA: Diagnosis not present

## 2017-10-12 DIAGNOSIS — I480 Paroxysmal atrial fibrillation: Secondary | ICD-10-CM | POA: Diagnosis not present

## 2017-10-12 DIAGNOSIS — M1612 Unilateral primary osteoarthritis, left hip: Secondary | ICD-10-CM | POA: Diagnosis not present

## 2017-10-12 DIAGNOSIS — E785 Hyperlipidemia, unspecified: Secondary | ICD-10-CM | POA: Diagnosis not present

## 2017-10-12 DIAGNOSIS — R059 Cough, unspecified: Secondary | ICD-10-CM

## 2017-10-12 MED ORDER — BENZONATATE 100 MG PO CAPS: 100.0000 mg | ORAL_CAPSULE | Freq: Two times a day (BID) | ORAL | 2 refills | Status: DC | PRN

## 2017-10-12 MED ORDER — ATORVASTATIN CALCIUM 20 MG PO TABS: 20.0000 mg | ORAL_TABLET | Freq: Every day | ORAL | 1 refills | Status: DC

## 2017-10-12 NOTE — Patient Instructions (Addendum)
Please follow up in January for your physical   I will let you know about the xray   Please let me know if you need anything

## 2017-10-12 NOTE — Progress Notes (Signed)
Patient presents to clinic today to establish care. She is a pleasant 84 year old female who  has a past medical history of BREAST BIOPSY, HX OF (06/10/2006), Breast cyst, HIP PAIN, LEFT (01/31/2007), HYPERLIPIDEMIA (06/10/2006), HYPERTENSION (06/10/2006), Impaired glucose tolerance, NEOPLASMS UNSPEC NATURE BONE SOFT TISSUE&SKIN (05/31/2008), OSTEOPOROSIS (06/10/2006), and Pulmonary embolus ([place]).  She is a former patient of Dr. Raliegh Ip who is transferring care  Acute Concerns: Establish Care  Left Hip Pain - has been present for for one month after sustaining a fall in grass. Pain is felt as a aching pain. Is worse when getting out of bed and with walking. Pain does radiate down her upper leg. No bruising noted   Cough - Dry cough, intermittent. Has been present for 3-4 weeks. Denies fevers, sinus pain/pressure or feeling ill. She does use smokeless tobacco daily.   Chronic Issues:  PAF - is followed by Cardiology. Currently prescribed Amiodarone and Eliquis   PVD with h/o abnormal ABI's of left leg - is followed by Vascular surgery    H/o PE - Pulmonary felt as though long term anticoagulant was warranted, on Eliquis.  Hyperlipidemia - Takes statin. Denies any issues  Lab Results  Component Value Date   CHOL 135 11/25/2016   HDL 65 11/25/2016   LDLCALC 48 11/25/2016   TRIG 110 11/25/2016   CHOLHDL 2.1 11/25/2016   Hypothyroidism - prescribed Synthroid 25 mcg Lab Results  Component Value Date   TSH 4.150 06/14/2017   Hypertension - Takes Norvac 10 mg and Metoprolol succinate 25 mg daily.  BP Readings from Last 3 Encounters:  10/12/17 (!) 150/56  08/28/17 (!) 153/59  07/12/17 (!) 142/60   Health Maintenance: Dental -- Upper dentures.  Vision -- Routine Care  Immunizations -- Needs Influenza vaccination  Colonoscopy -- No longer needs Mammogram -- No longer needs PAP -- No  longer needs Bone Density --   Treatment Team  1. Cardiology - Dr. Stanford Breed 2. Vascular Surgery - Dr.  Scot Dock   Past Medical History:  Diagnosis Date  . BREAST BIOPSY, HX OF 06/10/2006  . Breast cyst   . HIP PAIN, LEFT 01/31/2007  . HYPERLIPIDEMIA 06/10/2006  . HYPERTENSION 06/10/2006  . Impaired glucose tolerance   . NEOPLASMS UNSPEC NATURE BONE SOFT TISSUE&SKIN 05/31/2008  . OSTEOPOROSIS 06/10/2006  . Pulmonary embolus [facility])     Past Surgical History:  Procedure Laterality Date  . ABDOMINAL HYSTERECTOMY    . APPENDECTOMY    . BREAST SURGERY     bx x2  . CARDIOVERSION N/A 03/11/2017   Procedure: CARDIOVERSION;  Surgeon: Sanda Klein, MD;  Location: [facility];  Service: Cardiovascular;  Laterality: N/A;  . CHOLECYSTECTOMY  1980    Current Outpatient Medications on File Prior to Visit  Medication Sig Dispense Refill  . acetaminophen (TYLENOL) 500 MG tablet Take 500 mg by mouth every 6 (six) hours as needed for moderate pain or headache.    Marland Kitchen amiodarone (PACERONE) 200 MG tablet Take 200 mg by mouth daily.    Marland Kitchen amLODipine (NORVASC) 10 MG tablet Take 1 tablet (10 mg total) by mouth daily. 30 tablet 6  . ELIQUIS 5 MG TABS tablet TAKE 1 TABLET (5 MG TOTAL) BY MOUTH 2 (TWO) TIMES DAILY. 60 tablet 9  . furosemide (LASIX) 20 MG tablet Take 1 tablet (20 mg total) by mouth daily. 90 tablet 3  . levothyroxine (SYNTHROID, LEVOTHROID) 25 MCG tablet TAKE 1 TABLET BY MOUTH EVERY DAY 90 tablet 0  . metoprolol succinate (  TOPROL-XL) 25 MG 24 hr tablet Take 1 tablet (25 mg total) by mouth daily. 90 tablet 3   No current facility-administered medications on file prior to visit.     Allergies  Allergen Reactions  . Tuberculin Tests Other (See Comments)    Tuberculosis shot, red spot on arm    Family History  Problem Relation Age of Onset  . Heart disease Mother        Pacemaker  . Heart disease Father   . Asthma Father   . Diabetes Father   . Cancer Maternal Aunt        breast  . Heart disease Sister   . Hyperlipidemia Sister   . Hypertension Sister   . Lung cancer Sister      Social History   Socioeconomic History  . Marital status: Married    Spouse name: Not on file  . Number of children: 4  . Years of education: Not on file  . Highest education level: Not on file  Occupational History  . Not on file  Social Needs  . Financial resource strain: Not on file  . Food insecurity:    Worry: Not on file    Inability: Not on file  . Transportation needs:    Medical: Not on file    Non-medical: Not on file  Tobacco Use  . Smoking status: Never Smoker  . Smokeless tobacco: Current User    Types: Snuff  Substance and Sexual Activity  . Alcohol use: No    Alcohol/week: 0.0 standard drinks  . Drug use: No  . Sexual activity: Yes  Lifestyle  . Physical activity:    Days per week: Not on file    Minutes per session: Not on file  . Stress: Not on file  Relationships  . Social connections:    Talks on phone: Not on file    Gets together: Not on file    Attends religious service: Not on file    Active member of club or organization: Not on file    Attends meetings of clubs or organizations: Not on file    Relationship status: Not on file  . Intimate partner violence:    Fear of current or ex partner: Not on file    Emotionally abused: Not on file    Physically abused: Not on file    Forced sexual activity: Not on file  Other Topics Concern  . Not on file  Social History Narrative   She is retired    Worked in [employer] for 20 years    Married for 43 years    4 children - live locally.         Review of Systems  Constitutional: Negative.   HENT: Negative.   Eyes: Negative.   Respiratory: Negative.   Cardiovascular: Negative.   Gastrointestinal: Negative.   Genitourinary: Negative.   Musculoskeletal: Negative.   Skin: Negative.   Neurological: Negative.   Endo/Heme/Allergies: Negative.   Psychiatric/Behavioral: Negative.   All other systems reviewed and are negative.     BP (!) 150/56   Temp 98.1 F (36.7 C) (Oral)   Wt  193 lb (87.5 kg)   BMI 30.23 kg/m   Physical Exam  Constitutional: She is oriented to person, place, and time. She appears well-developed and well-nourished. No distress.  HENT:  Head: Normocephalic and atraumatic.  Right Ear: External ear normal.  Left Ear: External ear normal.  Nose: Nose normal.  Mouth/Throat: Oropharynx is clear and moist.  No oropharyngeal exudate.  Neck: Normal range of motion. Neck supple. No tracheal deviation present. No thyromegaly present.  Cardiovascular: Normal rate, regular rhythm, normal heart sounds and intact distal pulses. Exam reveals no gallop and no friction rub.  No murmur heard. Pulmonary/Chest: Effort normal and breath sounds normal. No stridor. No respiratory distress. She has no wheezes. She has no rales. She exhibits no tenderness.  Musculoskeletal: Normal range of motion. She exhibits tenderness (with palpation throughout left hip ). She exhibits no edema or deformity.  with palpation throughout left hip  Pain with straight leg raise and knee to chest. No pain with internal or external rotation.  Walks with limping gait   Neurological: She is alert and oriented to person, place, and time.  Skin: Skin is warm and dry. No rash noted. No erythema. No pallor.  Psychiatric: She has a normal mood and affect. Her behavior is normal. Judgment and thought content normal.  Nursing note and vitals reviewed.  Assessment/Plan: 1. Encounter to establish care - Follow up in Jan 2020 for CPE  - Follow up sooner if needed  2. Dyslipidemia - atorvastatin (LIPITOR) 20 MG tablet; Take 1 tablet (20 mg total) by mouth daily at 6 PM.  Dispense: 90 tablet; Refill: 1  3. Cough - No concern for bacterial infection. Does not appear to be cardiac related.  - Encouraged to cut back on smokeless tobacco  - benzonatate (TESSALON) 100 MG capsule; Take 1 capsule (100 mg total) by mouth 2 (two) times daily as needed for cough.  Dispense: 20 capsule; Refill: 2  4.  Essential hypertension - Elevated today.  - Continue to monitor   5. Paroxysmal atrial fibrillation (HCC) - Normal rate and rhythm  - No change in medications   6. Left hip pain  - DG HIP UNILAT WITH PELVIS 2-3 VIEWS LEFT; Future  [name], NP

## 2017-10-28 ENCOUNTER — Ambulatory Visit (INDEPENDENT_AMBULATORY_CARE_PROVIDER_SITE_OTHER): Payer: Medicare HMO | Admitting: Adult Health

## 2017-10-28 ENCOUNTER — Encounter: Payer: Self-pay | Admitting: Adult Health

## 2017-10-28 VITALS — BP 130/60 | Temp 98.0°F | Wt 187.4 lb

## 2017-10-28 DIAGNOSIS — M5432 Sciatica, left side: Secondary | ICD-10-CM | POA: Diagnosis not present

## 2017-10-28 DIAGNOSIS — Z23 Encounter for immunization: Secondary | ICD-10-CM

## 2017-10-28 MED ORDER — CYCLOBENZAPRINE HCL 5 MG PO TABS: 5.0000 mg | ORAL_TABLET | Freq: Every day | ORAL | 0 refills | Status: DC

## 2017-10-28 MED ORDER — METHYLPREDNISOLONE 4 MG PO TBPK: ORAL_TABLET | ORAL | 0 refills | Status: DC

## 2017-10-28 NOTE — Progress Notes (Signed)
Subjective:    Patient ID: Tammy [name], female    DOB: 10-15-1931, 84 y.o.   MRN: [id number]  HPI  84 year old female who  has a past medical history of BREAST BIOPSY, HX OF (06/10/2006), Breast cyst, HIP PAIN, LEFT (01/31/2007), HYPERLIPIDEMIA (06/10/2006), HYPERTENSION (06/10/2006), Impaired glucose tolerance, NEOPLASMS UNSPEC NATURE BONE SOFT TISSUE&SKIN (05/31/2008), OSTEOPOROSIS (06/10/2006), and Pulmonary embolus ([place]).  She presents to the office today for the complaint of left leg pain. This pain has been present for 2 weeks. Pain starts in her left lower back and travels down her left leg, past the knee. Pain is worse with sitting and standing for long periods of time. Pain is felt as a " sharp shooting" pain. She denies any falls since her last visit.   She denies any chest pain or shortness of breath   Review of Systems See HPI   Past Medical History:  Diagnosis Date  . BREAST BIOPSY, HX OF 06/10/2006  . Breast cyst   . HIP PAIN, LEFT 01/31/2007  . HYPERLIPIDEMIA 06/10/2006  . HYPERTENSION 06/10/2006  . Impaired glucose tolerance   . NEOPLASMS UNSPEC NATURE BONE SOFT TISSUE&SKIN 05/31/2008  . OSTEOPOROSIS 06/10/2006  . Pulmonary embolus [facility])     Social History   Socioeconomic History  . Marital status: Married    Spouse name: Not on file  . Number of children: 4  . Years of education: Not on file  . Highest education level: Not on file  Occupational History  . Not on file  Social Needs  . Financial resource strain: Not on file  . Food insecurity:    Worry: Not on file    Inability: Not on file  . Transportation needs:    Medical: Not on file    Non-medical: Not on file  Tobacco Use  . Smoking status: Never Smoker  . Smokeless tobacco: Current User    Types: Snuff  Substance and Sexual Activity  . Alcohol use: No    Alcohol/week: 0.0 standard drinks  . Drug use: No  . Sexual activity: Yes  Lifestyle  . Physical activity:    Days per week: Not on file    Minutes  per session: Not on file  . Stress: Not on file  Relationships  . Social connections:    Talks on phone: Not on file    Gets together: Not on file    Attends religious service: Not on file    Active member of club or organization: Not on file    Attends meetings of clubs or organizations: Not on file    Relationship status: Not on file  . Intimate partner violence:    Fear of current or ex partner: Not on file    Emotionally abused: Not on file    Physically abused: Not on file    Forced sexual activity: Not on file  Other Topics Concern  . Not on file  Social History Narrative   She is retired    Worked in [employer] for 20 years    Married for 43 years    4 children - live locally.         Past Surgical History:  Procedure Laterality Date  . ABDOMINAL HYSTERECTOMY    . APPENDECTOMY    . BREAST SURGERY     bx x2  . CARDIOVERSION N/A 03/11/2017   Procedure: CARDIOVERSION;  Surgeon: Sanda Klein, MD;  Location: [facility];  Service: Cardiovascular;  Laterality: N/A;  .  CHOLECYSTECTOMY  1980    Family History  Problem Relation [name] of Onset  . Heart disease Mother        Pacemaker  . Heart disease Father   . Asthma Father   . Diabetes Father   . Cancer Maternal Aunt        breast  . Heart disease Sister   . Hyperlipidemia Sister   . Hypertension Sister   . Lung cancer Sister     Allergies  Allergen Reactions  . Tuberculin Tests Other (See Comments)    Tuberculosis shot, red spot on arm    Current Outpatient Medications on File Prior to Visit  Medication Sig Dispense Refill  . acetaminophen (TYLENOL) 500 MG tablet Take 500 mg by mouth every 6 (six) hours as needed for moderate pain or headache.    Marland Kitchen amiodarone (PACERONE) 200 MG tablet Take 200 mg by mouth daily.    Marland Kitchen atorvastatin (LIPITOR) 20 MG tablet Take 1 tablet (20 mg total) by mouth daily at 6 PM. 90 tablet 1  . benzonatate (TESSALON) 100 MG capsule Take 1 capsule (100 mg total) by mouth 2 (two)  times daily as needed for cough. 20 capsule 2  . ELIQUIS 5 MG TABS tablet TAKE 1 TABLET (5 MG TOTAL) BY MOUTH 2 (TWO) TIMES DAILY. 60 tablet 9  . furosemide (LASIX) 20 MG tablet Take 1 tablet (20 mg total) by mouth daily. 90 tablet 3  . levothyroxine (SYNTHROID, LEVOTHROID) 25 MCG tablet TAKE 1 TABLET BY MOUTH EVERY DAY 90 tablet 0  . metoprolol succinate (TOPROL-XL) 25 MG 24 hr tablet Take 1 tablet (25 mg total) by mouth daily. 90 tablet 3  . amLODipine (NORVASC) 10 MG tablet Take 1 tablet (10 mg total) by mouth daily. 30 tablet 6   No current facility-administered medications on file prior to visit.     BP 130/60 (BP Location: Left Arm, Patient Position: Sitting, Cuff Size: Large)   Temp 98 F (36.7 C) (Oral)   Wt 187 lb 6.4 oz (85 kg)   BMI 29.35 kg/m       Objective:   Physical Exam  Constitutional: She is oriented to person, place, and time. She appears well-developed and well-nourished. No distress.  Cardiovascular: Normal rate, regular rhythm, normal heart sounds and intact distal pulses.  Pulmonary/Chest: Effort normal and breath sounds normal.  Musculoskeletal: Normal range of motion. She exhibits tenderness. She exhibits no edema or deformity.  Tenderness to left lower back and throughout left lateral leg - along sciatic nerve.   Neurological: She is alert and oriented to person, place, and time.  Skin: She is not diaphoretic.  Nursing note and vitals reviewed.     Assessment & Plan:  1. Sciatica of left side - Exam consistent with sciatica pain. Will prescribe medrol dose pack and low dose flexeril. She was advised that flexeril may make her sleepy and to be careful with ambulation  - methylPREDNISolone (MEDROL DOSEPAK) 4 MG TBPK tablet; Take as directed  Dispense: 21 tablet; Refill: 0 - cyclobenzaprine (FLEXERIL) 5 MG tablet; Take 1 tablet (5 mg total) by mouth at bedtime.  Dispense: 5 tablet; Refill: 0  [name], NP

## 2017-11-10 ENCOUNTER — Ambulatory Visit (INDEPENDENT_AMBULATORY_CARE_PROVIDER_SITE_OTHER): Payer: Medicare HMO | Admitting: Adult Health

## 2017-11-10 ENCOUNTER — Encounter: Payer: Self-pay | Admitting: Adult Health

## 2017-11-10 VITALS — BP 130/60 | Temp 97.7°F | Wt 186.0 lb

## 2017-11-10 DIAGNOSIS — M5432 Sciatica, left side: Secondary | ICD-10-CM | POA: Diagnosis not present

## 2017-11-10 MED ORDER — TRAMADOL HCL 50 MG PO TABS: 50.0000 mg | ORAL_TABLET | Freq: Four times a day (QID) | ORAL | 0 refills | Status: AC | PRN

## 2017-11-10 MED ORDER — PREDNISONE 10 MG PO TABS
ORAL_TABLET | ORAL | 0 refills | Status: DC
Start: 2017-11-10 — End: 2017-12-28

## 2017-11-10 NOTE — Progress Notes (Signed)
Subjective:    Patient ID: Tammy [name], female    DOB: November 03, 1931, 84 y.o.   MRN: [id number]  HPI 84 year old female who  has a past medical history of BREAST BIOPSY, HX OF (06/10/2006), Breast cyst, HIP PAIN, LEFT (01/31/2007), HYPERLIPIDEMIA (06/10/2006), HYPERTENSION (06/10/2006), Impaired glucose tolerance, NEOPLASMS UNSPEC NATURE BONE SOFT TISSUE&SKIN (05/31/2008), OSTEOPOROSIS (06/10/2006), and Pulmonary embolus ([place]).  She presents to the office today for follow-up regarding suspected left-sided sciatica.  During her last visit approximately 2 weeks ago she was prescribed a Medrol Dosepak and low-dose Flexeril.  She reports that since seeing the Medrol Dosepak the pain in her hip and left thigh have improved but she continues to have a radiating pain down the side of her left leg.  Pain is worse below the knee towards the ankle.  Pain is worse with change in positions, walking, and patient.  The redness, warmth, or edema( past baseline).  Denies any chest pain or shortness of breath   Review of Systems See HPI   Past Medical History:  Diagnosis Date  . BREAST BIOPSY, HX OF 06/10/2006  . Breast cyst   . HIP PAIN, LEFT 01/31/2007  . HYPERLIPIDEMIA 06/10/2006  . HYPERTENSION 06/10/2006  . Impaired glucose tolerance   . NEOPLASMS UNSPEC NATURE BONE SOFT TISSUE&SKIN 05/31/2008  . OSTEOPOROSIS 06/10/2006  . Pulmonary embolus [facility])     Social History   Socioeconomic History  . Marital status: Married    Spouse name: Not on file  . Number of children: 4  . Years of education: Not on file  . Highest education level: Not on file  Occupational History  . Not on file  Social Needs  . Financial resource strain: Not on file  . Food insecurity:    Worry: Not on file    Inability: Not on file  . Transportation needs:    Medical: Not on file    Non-medical: Not on file  Tobacco Use  . Smoking status: Never Smoker  . Smokeless tobacco: Current User    Types: Snuff  Substance and Sexual  Activity  . Alcohol use: No    Alcohol/week: 0.0 standard drinks  . Drug use: No  . Sexual activity: Yes  Lifestyle  . Physical activity:    Days per week: Not on file    Minutes per session: Not on file  . Stress: Not on file  Relationships  . Social connections:    Talks on phone: Not on file    Gets together: Not on file    Attends religious service: Not on file    Active member of club or organization: Not on file    Attends meetings of clubs or organizations: Not on file    Relationship status: Not on file  . Intimate partner violence:    Fear of current or ex partner: Not on file    Emotionally abused: Not on file    Physically abused: Not on file    Forced sexual activity: Not on file  Other Topics Concern  . Not on file  Social History Narrative   She is retired    Worked in [employer] for 20 years    Married for 43 years    4 children - live locally.         Past Surgical History:  Procedure Laterality Date  . ABDOMINAL HYSTERECTOMY    . APPENDECTOMY    . BREAST SURGERY     bx x2  .  CARDIOVERSION N/A 03/11/2017   Procedure: CARDIOVERSION;  Surgeon: Sanda Klein, MD;  Location: [facility];  Service: Cardiovascular;  Laterality: N/A;  . CHOLECYSTECTOMY  1980    Family History  Problem Relation [name] of Onset  . Heart disease Mother        Pacemaker  . Heart disease Father   . Asthma Father   . Diabetes Father   . Cancer Maternal Aunt        breast  . Heart disease Sister   . Hyperlipidemia Sister   . Hypertension Sister   . Lung cancer Sister     Allergies  Allergen Reactions  . Tuberculin Tests Other (See Comments)    Tuberculosis shot, red spot on arm    Current Outpatient Medications on File Prior to Visit  Medication Sig Dispense Refill  . acetaminophen (TYLENOL) 500 MG tablet Take 500 mg by mouth every 6 (six) hours as needed for moderate pain or headache.    Marland Kitchen amiodarone (PACERONE) 200 MG tablet Take 200 mg by mouth daily.    Marland Kitchen  atorvastatin (LIPITOR) 20 MG tablet Take 1 tablet (20 mg total) by mouth daily at 6 PM. 90 tablet 1  . benzonatate (TESSALON) 100 MG capsule Take 1 capsule (100 mg total) by mouth 2 (two) times daily as needed for cough. 20 capsule 2  . cyclobenzaprine (FLEXERIL) 5 MG tablet Take 1 tablet (5 mg total) by mouth at bedtime. 5 tablet 0  . ELIQUIS 5 MG TABS tablet TAKE 1 TABLET (5 MG TOTAL) BY MOUTH 2 (TWO) TIMES DAILY. 60 tablet 9  . furosemide (LASIX) 20 MG tablet Take 1 tablet (20 mg total) by mouth daily. 90 tablet 3  . levothyroxine (SYNTHROID, LEVOTHROID) 25 MCG tablet TAKE 1 TABLET BY MOUTH EVERY DAY 90 tablet 0  . methylPREDNISolone (MEDROL DOSEPAK) 4 MG TBPK tablet Take as directed 21 tablet 0  . metoprolol succinate (TOPROL-XL) 25 MG 24 hr tablet Take 1 tablet (25 mg total) by mouth daily. 90 tablet 3  . amLODipine (NORVASC) 10 MG tablet Take 1 tablet (10 mg total) by mouth daily. 30 tablet 6   No current facility-administered medications on file prior to visit.     BP 130/60   Temp 97.7 F (36.5 C) (Oral)   Wt 186 lb (84.4 kg)   BMI 29.13 kg/m       Objective:   Physical Exam  Constitutional: She is oriented to person, place, and time. She appears well-developed and well-nourished. No distress.  Cardiovascular: Normal rate, regular rhythm, normal heart sounds and intact distal pulses.  Pulmonary/Chest: Effort normal and breath sounds normal.  Musculoskeletal: She exhibits edema (+2 pitting edema bilateral lower extremities) and tenderness (Tenderness noted throughout bilateral lower extremities, worse on left lateral lower extremity.). She exhibits no deformity.  No calf redness or warmth  Neurological: She is alert and oriented to person, place, and time.  Skin: Skin is warm and dry. Capillary refill takes less than 2 seconds. She is not diaphoretic.  Psychiatric: She has a normal mood and affect. Her behavior is normal. Judgment and thought content normal.  Nursing note and  vitals reviewed.     Assessment & Plan:  1. Left sided sciatica -Going to have her increase Lasix to 40 mg for the next 3 days as I think her edema is causing majority of her pain.  We will give a stronger dose of prednisone.  Good to have her follow-up in 2 days for repeat exam -  predniSONE (DELTASONE) 10 MG tablet; 40 mg x 3 days, 20 mg x 3 days, 10 mg x 3 days  Dispense: 21 tablet; Refill: 0 - traMADol (ULTRAM) 50 MG tablet; Take 1 tablet (50 mg total) by mouth every 6 (six) hours as needed for up to 5 days.  Dispense: 5 tablet; Refill: 0  [name], NP

## 2017-11-12 ENCOUNTER — Ambulatory Visit (INDEPENDENT_AMBULATORY_CARE_PROVIDER_SITE_OTHER): Payer: Medicare HMO | Admitting: Adult Health

## 2017-11-12 ENCOUNTER — Encounter: Payer: Self-pay | Admitting: Adult Health

## 2017-11-12 VITALS — BP 122/60 | Temp 97.7°F | Wt 186.0 lb

## 2017-11-12 DIAGNOSIS — M5432 Sciatica, left side: Secondary | ICD-10-CM | POA: Diagnosis not present

## 2017-11-12 NOTE — Progress Notes (Signed)
Subjective:    Patient ID: Tammy [name], female    DOB: [birth year], 85 y.o.   MRN: [id number]  HPI  84 year old female who  has a past medical history of BREAST BIOPSY, HX OF (06/10/2006), Breast cyst, HIP PAIN, LEFT (01/31/2007), HYPERLIPIDEMIA (06/10/2006), HYPERTENSION (06/10/2006), Impaired glucose tolerance, NEOPLASMS UNSPEC NATURE BONE SOFT TISSUE&SKIN (05/31/2008), OSTEOPOROSIS (06/10/2006), and Pulmonary embolus ([place]).  She presents to the office today for follow up regarding left sided sciatica. I last saw her two days ago and had her increase Lasix to 40 mg daily x 3 days and prescribed a course of prednisone.   Today in the office she reports that her sciatica is much improved. She feels as though her swelling has improved as well.     Review of Systems See HPI   Past Medical History:  Diagnosis Date  . BREAST BIOPSY, HX OF 06/10/2006  . Breast cyst   . HIP PAIN, LEFT 01/31/2007  . HYPERLIPIDEMIA 06/10/2006  . HYPERTENSION 06/10/2006  . Impaired glucose tolerance   . NEOPLASMS UNSPEC NATURE BONE SOFT TISSUE&SKIN 05/31/2008  . OSTEOPOROSIS 06/10/2006  . Pulmonary embolus [facility])     Social History   Socioeconomic History  . Marital status: Married    Spouse name: Not on file  . Number of children: 4  . Years of education: Not on file  . Highest education level: Not on file  Occupational History  . Not on file  Social Needs  . Financial resource strain: Not on file  . Food insecurity:    Worry: Not on file    Inability: Not on file  . Transportation needs:    Medical: Not on file    Non-medical: Not on file  Tobacco Use  . Smoking status: Never Smoker  . Smokeless tobacco: Current User    Types: Snuff  Substance and Sexual Activity  . Alcohol use: No    Alcohol/week: 0.0 standard drinks  . Drug use: No  . Sexual activity: Yes  Lifestyle  . Physical activity:    Days per week: Not on file    Minutes per session: Not on file  . Stress: Not on file  Relationships  .  Social connections:    Talks on phone: Not on file    Gets together: Not on file    Attends religious service: Not on file    Active member of club or organization: Not on file    Attends meetings of clubs or organizations: Not on file    Relationship status: Not on file  . Intimate partner violence:    Fear of current or ex partner: Not on file    Emotionally abused: Not on file    Physically abused: Not on file    Forced sexual activity: Not on file  Other Topics Concern  . Not on file  Social History Narrative   She is retired    Worked in [employer] for 20 years    Married for 43 years    4 children - live locally.         Past Surgical History:  Procedure Laterality Date  . ABDOMINAL HYSTERECTOMY    . APPENDECTOMY    . BREAST SURGERY     bx x2  . CARDIOVERSION N/A 03/11/2017   Procedure: CARDIOVERSION;  Surgeon: Sanda Klein, MD;  Location: [facility];  Service: Cardiovascular;  Laterality: N/A;  . CHOLECYSTECTOMY  1980    Family History  Problem Relation [name] of  Onset  . Heart disease Mother        Pacemaker  . Heart disease Father   . Asthma Father   . Diabetes Father   . Cancer Maternal Aunt        breast  . Heart disease Sister   . Hyperlipidemia Sister   . Hypertension Sister   . Lung cancer Sister     Allergies  Allergen Reactions  . Tuberculin Tests Other (See Comments)    Tuberculosis shot, red spot on arm    Current Outpatient Medications on File Prior to Visit  Medication Sig Dispense Refill  . acetaminophen (TYLENOL) 500 MG tablet Take 500 mg by mouth every 6 (six) hours as needed for moderate pain or headache.    Marland Kitchen amiodarone (PACERONE) 200 MG tablet Take 200 mg by mouth daily.    Marland Kitchen atorvastatin (LIPITOR) 20 MG tablet Take 1 tablet (20 mg total) by mouth daily at 6 PM. 90 tablet 1  . ELIQUIS 5 MG TABS tablet TAKE 1 TABLET (5 MG TOTAL) BY MOUTH 2 (TWO) TIMES DAILY. 60 tablet 9  . furosemide (LASIX) 20 MG tablet Take 1 tablet (20 mg  total) by mouth daily. 90 tablet 3  . levothyroxine (SYNTHROID, LEVOTHROID) 25 MCG tablet TAKE 1 TABLET BY MOUTH EVERY DAY 90 tablet 0  . metoprolol succinate (TOPROL-XL) 25 MG 24 hr tablet Take 1 tablet (25 mg total) by mouth daily. 90 tablet 3  . predniSONE (DELTASONE) 10 MG tablet 40 mg x 3 days, 20 mg x 3 days, 10 mg x 3 days 21 tablet 0  . traMADol (ULTRAM) 50 MG tablet Take 1 tablet (50 mg total) by mouth every 6 (six) hours as needed for up to 5 days. 5 tablet 0  . amLODipine (NORVASC) 10 MG tablet Take 1 tablet (10 mg total) by mouth daily. 30 tablet 6  . benzonatate (TESSALON) 100 MG capsule Take 1 capsule (100 mg total) by mouth 2 (two) times daily as needed for cough. (Patient not taking: Reported on 11/12/2017) 20 capsule 2   No current facility-administered medications on file prior to visit.     BP 122/60   Temp 97.7 F (36.5 C) (Oral)   Wt 186 lb (84.4 kg)   BMI 29.13 kg/m       Objective:   Physical Exam  Constitutional: She is oriented to person, place, and time. She appears well-developed and well-nourished. No distress.  Cardiovascular: Normal rate, regular rhythm, normal heart sounds and intact distal pulses.  Pulmonary/Chest: Effort normal and breath sounds normal.  Musculoskeletal: She exhibits edema and tenderness. She exhibits no deformity.  Has edema in bilateral legs, but has improved. Continues to have trace left sided sciatica pain   Neurological: She is alert and oriented to person, place, and time.  Skin: Skin is warm and dry. She is not diaphoretic.  Psychiatric: She has a normal mood and affect. Her behavior is normal. Judgment and thought content normal.  Nursing note and vitals reviewed.     Assessment & Plan:  1. Left sided sciatica - Go back to 20 mg lasix daily  - finished prednisone therapy  - follow up as needed  [name], NP

## 2017-12-01 NOTE — Progress Notes (Signed)
HPI: FU syncope and atrial fibrillation. Patient with history of syncope felt secondary to autonomic dysfunction. Previously loop monitor considered; Dr Lovena Le felt it would be indicated if recurrent syncope. H/O pulmonary embolus; seen by pulmonary and chronic anticoagulation felt indicated. Peripheral vascular disease followed by Dr. Scot Dock.Echocardiogram November 2018 showed normal LV function, biatrial enlargement and moderate tricuspid regurgitation. Holter monitor November 2018 showed atrial fibrillation with PVCs or aberrantly conducted beats rate controlled.   Patient previously placed on amiodarone and underwent successful cardioversion to sinus rhythm February 2019.  Since last seen,she denies dyspnea, chest pain, palpitations or syncope.  Current Outpatient Medications  Medication Sig Dispense Refill  . acetaminophen (TYLENOL) 500 MG tablet Take 500 mg by mouth every 6 (six) hours as needed for moderate pain or headache.    Marland Kitchen amiodarone (PACERONE) 200 MG tablet Take 200 mg by mouth daily.    Marland Kitchen atorvastatin (LIPITOR) 20 MG tablet Take 1 tablet (20 mg total) by mouth daily at 6 PM. 90 tablet 1  . benzonatate (TESSALON) 100 MG capsule Take 1 capsule (100 mg total) by mouth 2 (two) times daily as needed for cough. 20 capsule 2  . ELIQUIS 5 MG TABS tablet TAKE 1 TABLET (5 MG TOTAL) BY MOUTH 2 (TWO) TIMES DAILY. 60 tablet 9  . furosemide (LASIX) 20 MG tablet Take 1 tablet (20 mg total) by mouth daily. 90 tablet 3  . levothyroxine (SYNTHROID, LEVOTHROID) 25 MCG tablet TAKE 1 TABLET BY MOUTH EVERY DAY 90 tablet 1  . metoprolol succinate (TOPROL-XL) 25 MG 24 hr tablet Take 1 tablet (25 mg total) by mouth daily. 90 tablet 3  . predniSONE (DELTASONE) 10 MG tablet 40 mg x 3 days, 20 mg x 3 days, 10 mg x 3 days 21 tablet 0  . amLODipine (NORVASC) 10 MG tablet Take 1 tablet (10 mg total) by mouth daily. 30 tablet 6   No current facility-administered medications for this visit.       Past Medical History:  Diagnosis Date  . BREAST BIOPSY, HX OF 06/10/2006  . Breast cyst   . HIP PAIN, LEFT 01/31/2007  . HYPERLIPIDEMIA 06/10/2006  . HYPERTENSION 06/10/2006  . Impaired glucose tolerance   . NEOPLASMS UNSPEC NATURE BONE SOFT TISSUE&SKIN 05/31/2008  . OSTEOPOROSIS 06/10/2006  . Pulmonary embolus [facility])     Past Surgical History:  Procedure Laterality Date  . ABDOMINAL HYSTERECTOMY    . APPENDECTOMY    . BREAST SURGERY     bx x2  . CARDIOVERSION N/A 03/11/2017   Procedure: CARDIOVERSION;  Surgeon: Sanda Klein, MD;  Location: [facility];  Service: Cardiovascular;  Laterality: N/A;  . CHOLECYSTECTOMY  1980    Social History   Socioeconomic History  . Marital status: Married    Spouse name: Not on file  . Number of children: 4  . Years of education: Not on file  . Highest education level: Not on file  Occupational History  . Not on file  Social Needs  . Financial resource strain: Not on file  . Food insecurity:    Worry: Not on file    Inability: Not on file  . Transportation needs:    Medical: Not on file    Non-medical: Not on file  Tobacco Use  . Smoking status: Never Smoker  . Smokeless tobacco: Current User    Types: Snuff  Substance and Sexual Activity  . Alcohol use: No    Alcohol/week: 0.0 standard drinks  . Drug use:  No  . Sexual activity: Yes  Lifestyle  . Physical activity:    Days per week: Not on file    Minutes per session: Not on file  . Stress: Not on file  Relationships  . Social connections:    Talks on phone: Not on file    Gets together: Not on file    Attends religious service: Not on file    Active member of club or organization: Not on file    Attends meetings of clubs or organizations: Not on file    Relationship status: Not on file  . Intimate partner violence:    Fear of current or ex partner: Not on file    Emotionally abused: Not on file    Physically abused: Not on file    Forced sexual activity: Not on  file  Other Topics Concern  . Not on file  Social History Narrative   She is retired    Worked in [employer] for 20 years    Married for 43 years    4 children - live locally.         Family History  Problem Relation Age of Onset  . Heart disease Mother        Pacemaker  . Heart disease Father   . Asthma Father   . Diabetes Father   . Cancer Maternal Aunt        breast  . Heart disease Sister   . Hyperlipidemia Sister   . Hypertension Sister   . Lung cancer Sister     ROS: Hip and back pain but no fevers or chills, productive cough, hemoptysis, dysphasia, odynophagia, melena, hematochezia, dysuria, hematuria, rash, seizure activity, orthopnea, PND, pedal edema, claudication. Remaining systems are negative.  Physical Exam: Well-developed well-nourished in no acute distress.  Skin is warm and dry.  HEENT is normal.  Neck is supple.  Chest is clear to auscultation with normal expansion.  Cardiovascular exam is regular rate and rhythm.  Abdominal exam nontender or distended. No masses palpated. Extremities show no edema. neuro grossly intact  A/P  1 paroxysmal atrial fibrillation-patient is in sinus rhythm today.  We will continue with present dose of amiodarone.  Check TSH, liver functions and chest x-ray.  Continue apixaban.  Check hemoglobin and renal function.  2 hypertension-patient's blood pressure is controlled.  Continue present medications and follow.  3 history of syncope-She has not had recurrent episodes.  Would consider loop monitor in the future for recurrences.  4 history of pulmonary embolus-previous assessment by pulmonary noted and long-term anticoagulation felt indicated.  Continue apixaban.  5 hyperlipidemia-continue statin.  6 tricuspid regurgitation-she will need follow-up echoes in the future.  7 peripheral vascular disease-followed by vascular surgery.  Continue statin.  Kirk Ruths, MD

## 2017-12-08 ENCOUNTER — Other Ambulatory Visit: Payer: Self-pay | Admitting: Internal Medicine

## 2017-12-09 NOTE — Telephone Encounter (Signed)
Cory's pt  

## 2017-12-09 NOTE — Telephone Encounter (Signed)
Tammy [name], you have not prescribed this medication in the past.  Please advise.

## 2017-12-13 ENCOUNTER — Encounter: Payer: Self-pay | Admitting: Cardiology

## 2017-12-13 ENCOUNTER — Ambulatory Visit: Payer: Medicare HMO | Admitting: Cardiology

## 2017-12-13 ENCOUNTER — Ambulatory Visit (HOSPITAL_COMMUNITY)
Admission: RE | Admit: 2017-12-13 | Discharge: 2017-12-13 | Disposition: A | Discharge status: Home / Self Care | Payer: Medicare HMO | Source: Ambulatory Visit | Attending: Cardiology | Admitting: Cardiology

## 2017-12-13 VITALS — BP 130/70 | HR 60 | Ht 67.0 in | Wt 191.0 lb

## 2017-12-13 DIAGNOSIS — R55 Syncope and collapse: Secondary | ICD-10-CM | POA: Diagnosis not present

## 2017-12-13 DIAGNOSIS — I10 Essential (primary) hypertension: Secondary | ICD-10-CM | POA: Diagnosis not present

## 2017-12-13 DIAGNOSIS — I480 Paroxysmal atrial fibrillation: Secondary | ICD-10-CM

## 2017-12-13 DIAGNOSIS — I517 Cardiomegaly: Secondary | ICD-10-CM | POA: Insufficient documentation

## 2017-12-13 DIAGNOSIS — E7800 Pure hypercholesterolemia, unspecified: Secondary | ICD-10-CM

## 2017-12-13 DIAGNOSIS — R918 Other nonspecific abnormal finding of lung field: Secondary | ICD-10-CM | POA: Diagnosis not present

## 2017-12-13 NOTE — Patient Instructions (Signed)
Medication Instructions:  NO CHANGE If you need a refill on your cardiac medications before your next appointment, please call your pharmacy.   Lab work: Your physician recommends that you HAVE LAB WORK TODAY If you have labs (blood work) drawn today and your tests are completely normal, you will receive your results only by: Marland Kitchen MyChart Message (if you have MyChart) OR . A paper copy in the mail If you have any lab test that is abnormal or we need to change your treatment, we will call you to review the results.  Testing/Procedures: A chest x-ray takes a picture of the organs and structures inside the chest, including the heart, lungs, and blood vessels. This test can show several things, including, whether the heart is enlarges; whether fluid is building up in the lungs; and whether pacemaker / defibrillator leads are still in place. AT [place]: At [facility], you and your health needs are our priority.  As part of our continuing mission to provide you with exceptional heart care, we have created designated Provider Care Teams.  These Care Teams include your primary Cardiologist (physician) and Advanced Practice Providers (APPs -  Physician Assistants and Nurse Practitioners) who all work together to provide you with the care you need, when you need it. You will need a follow up appointment in 6 months.  Please call our office 2 months in advance to schedule this appointment.  You may see Kirk Ruths, MD or one of the following Advanced Practice Providers on your designated Care Team:   Kerin Ransom, PA-C Roby Lofts, [place] . Sande Rives, PA-C

## 2017-12-14 ENCOUNTER — Telehealth: Payer: Self-pay | Admitting: *Deleted

## 2017-12-14 LAB — BASIC METABOLIC PANEL
BUN / CREAT RATIO: 16 (ref 12–28)
BUN: 24 mg/dL (ref 8–27)
CHLORIDE: 105 mmol/L (ref 96–106)
CO2: 22 mmol/L (ref 20–29)
CREATININE: 1.52 mg/dL — AB (ref 0.57–1.00)
Calcium: 9.4 mg/dL (ref 8.7–10.3)
GFR calc Af Amer: 36 mL/min/{1.73_m2} — ABNORMAL LOW (ref >59)
GFR calc non Af Amer: 31 mL/min/{1.73_m2} — ABNORMAL LOW (ref >59)
GLUCOSE: 103 mg/dL — AB (ref 65–99)
Potassium: 5.1 mmol/L (ref 3.5–5.2)
SODIUM: 142 mmol/L (ref 134–144)

## 2017-12-14 LAB — CBC
HEMOGLOBIN: 13.6 g/dL (ref 11.1–15.9)
Hematocrit: 40.6 % (ref 34.0–46.6)
MCH: 31.2 pg (ref 26.6–33.0)
MCHC: 33.5 g/dL (ref 31.5–35.7)
MCV: 93 fL (ref 79–97)
PLATELETS: 326 10*3/uL (ref 150–450)
RBC: 4.36 x10E6/uL (ref 3.77–5.28)
RDW: 13.6 % (ref 12.3–15.4)
WBC: 6.3 10*3/uL (ref 3.4–10.8)

## 2017-12-14 LAB — HEPATIC FUNCTION PANEL
ALBUMIN: 4.3 g/dL (ref 3.5–4.7)
ALK PHOS: 71 IU/L (ref 39–117)
ALT: 25 IU/L (ref 0–32)
AST: 29 IU/L (ref 0–40)
Bilirubin Total: 0.6 mg/dL (ref 0.0–1.2)
Bilirubin, Direct: 0.17 mg/dL (ref 0.00–0.40)
Total Protein: 6.5 g/dL (ref 6.0–8.5)

## 2017-12-14 LAB — TSH: TSH: 4.05 u[IU]/mL (ref 0.450–4.500)

## 2017-12-14 MED ORDER — APIXABAN 2.5 MG PO TABS: 2.5000 mg | ORAL_TABLET | Freq: Two times a day (BID) | ORAL | 3 refills | Status: DC

## 2017-12-14 NOTE — Telephone Encounter (Signed)
-----   Message from Lelon Perla, MD sent at 12/13/2017  5:21 PM EST ----- Change apixaban to 2.5 mg BID Kirk Ruths

## 2017-12-14 NOTE — Telephone Encounter (Signed)
Spoke with pt, Aware of dr Jacalyn Lefevre recommendations. Patient voiced understanding to take 1/2 tablet twice daily until she picks up the new prescription.

## 2017-12-22 DIAGNOSIS — M545 Low back pain: Secondary | ICD-10-CM | POA: Diagnosis not present

## 2017-12-22 DIAGNOSIS — M25552 Pain in left hip: Secondary | ICD-10-CM | POA: Diagnosis not present

## 2017-12-28 ENCOUNTER — Ambulatory Visit (HOSPITAL_COMMUNITY)
Admission: RE | Admit: 2017-12-28 | Discharge: 2017-12-28 | Disposition: A | Discharge status: Home / Self Care | Payer: Medicare HMO | Source: Ambulatory Visit | Attending: Vascular Surgery | Admitting: Vascular Surgery

## 2017-12-28 ENCOUNTER — Encounter: Payer: Self-pay | Admitting: Family

## 2017-12-28 ENCOUNTER — Ambulatory Visit: Payer: Medicare HMO | Admitting: Family

## 2017-12-28 VITALS — BP 142/67 | HR 50 | Resp 16 | Ht 67.0 in | Wt 187.0 lb

## 2017-12-28 DIAGNOSIS — I872 Venous insufficiency (chronic) (peripheral): Secondary | ICD-10-CM

## 2017-12-28 DIAGNOSIS — I779 Disorder of arteries and arterioles, unspecified: Secondary | ICD-10-CM | POA: Diagnosis not present

## 2017-12-28 DIAGNOSIS — I739 Peripheral vascular disease, unspecified: Secondary | ICD-10-CM | POA: Insufficient documentation

## 2017-12-28 DIAGNOSIS — E119 Type 2 diabetes mellitus without complications: Secondary | ICD-10-CM | POA: Diagnosis not present

## 2017-12-28 NOTE — Progress Notes (Signed)
[facility] [place]   CC: Follow up peripheral artery occlusive disease   History of Present Illness Tammy [name] is a 84 y.o. female who presents for evaluation of peripheral artery occlusive disease.  This patient has evidence of infrainguinal arterial occlusive disease on the left and evidence of chronic venous insufficiency bilaterally.   She has been evaluated by Dr. Scot Dock on previous visits.          She denies any rest pain or nonhealing ulcers. Previous ABI's were right 100 % triphasic flow, left 65% monophasic.  She and her husband do the shopping together and she is able to walk around the store without difficulty holding on to the shopping cart for support.                Past medical history has not changed.  HTN,Pulmonary embolus managed with chronic Eliquis, A fib and dislidemia managed with Lipitor.    Serum creatinine was 1.52 on 12-13-17, GFR at that time 31, stage 3b CKD.   She was last evaluated on 06-16-17 by [facility]. At that time pt had no history of non healing wounds and was able to carry out her ADL's.  Pt was encouraged to elevate B LE when at rest to help with the chronic edema; also discussed the importance of activity daily to maintain her current arterial flow in the left LE.   The symptoms of non healing wound development was reviewed and or rest pain would be a reason to call us. Pt was to follow up in 6 month with repeat ABI's.  She had a small decrease in her ABI's on the left LE from her previous study.    Husband states that pt legs are much less swollen in the last 3 months, states her diuretic was increased for a couple of weeks, then reduced, but the edema in her legs has remained less.   Pt denies chest pain, dyspnea, or feeling light headed.   Next week she will follow up with Dr. Gladstone Lighter re her HNP and left hip bursitis, cortisone injection in left hip helped temporarily.   Diabetic: no A1C result on file, glucose  103-151 from 5-220-19 to 12-13-17, impaired glucose tolerance  Tobacco use: non-smoker  Pt meds include: Statin :Yes Betablocker: Yes ASA: No Other anticoagulants/antiplatelets: Eliquis 2.5 mg bid, hx of left leg DVT and PE, and paroxysmal atrial fib; successful cardioversion to sinus rhythm February 2019 (review of Dr. Stanford Breed note dated 12-13-17)    Past Medical History:  Diagnosis Date  . BREAST BIOPSY, HX OF 06/10/2006  . Breast cyst   . HIP PAIN, LEFT 01/31/2007  . HYPERLIPIDEMIA 06/10/2006  . HYPERTENSION 06/10/2006  . Impaired glucose tolerance   . NEOPLASMS UNSPEC NATURE BONE SOFT TISSUE&SKIN 05/31/2008  . OSTEOPOROSIS 06/10/2006  . Pulmonary embolus [facility])     Social History Social History   Tobacco Use  . Smoking status: Never Smoker  . Smokeless tobacco: Current User    Types: Snuff  Substance Use Topics  . Alcohol use: No    Alcohol/week: 0.0 standard drinks  . Drug use: No    Family History Family History  Problem Relation Age of Onset  . Heart disease Mother        Pacemaker  . Heart disease Father   . Asthma Father   . Diabetes Father   . Cancer Maternal Aunt        breast  . Heart disease Sister   .  Hyperlipidemia Sister   . Hypertension Sister   . Lung cancer Sister     Past Surgical History:  Procedure Laterality Date  . ABDOMINAL HYSTERECTOMY    . APPENDECTOMY    . BREAST SURGERY     bx x2  . CARDIOVERSION N/A 03/11/2017   Procedure: CARDIOVERSION;  Surgeon: Sanda Klein, MD;  Location: [facility];  Service: Cardiovascular;  Laterality: N/A;  . CHOLECYSTECTOMY  1980    Allergies  Allergen Reactions  . Tuberculin Tests Other (See Comments)    Tuberculosis shot, red spot on arm    Current Outpatient Medications  Medication Sig Dispense Refill  . acetaminophen (TYLENOL) 500 MG tablet Take 500 mg by mouth every 6 (six) hours as needed for moderate pain or headache.    Marland Kitchen amiodarone (PACERONE) 200 MG tablet Take 200 mg by mouth daily.     Marland Kitchen apixaban (ELIQUIS) 2.5 MG TABS tablet Take 1 tablet (2.5 mg total) by mouth 2 (two) times daily. 180 tablet 3  . atorvastatin (LIPITOR) 20 MG tablet Take 1 tablet (20 mg total) by mouth daily at 6 PM. 90 tablet 1  . furosemide (LASIX) 20 MG tablet Take 1 tablet (20 mg total) by mouth daily. 90 tablet 3  . levothyroxine (SYNTHROID, LEVOTHROID) 25 MCG tablet TAKE 1 TABLET BY MOUTH EVERY DAY 90 tablet 1  . metoprolol succinate (TOPROL-XL) 25 MG 24 hr tablet Take 1 tablet (25 mg total) by mouth daily. 90 tablet 3  . oxyCODONE-acetaminophen (PERCOCET/ROXICET) 5-325 MG tablet Take 1 tablet by mouth every 6 (six) hours as needed for severe pain.    Marland Kitchen amLODipine (NORVASC) 10 MG tablet Take 1 tablet (10 mg total) by mouth daily. 30 tablet 6   No current facility-administered medications for this visit.     ROS: See HPI for pertinent positives and negatives.   Physical Examination  Vitals:   12/28/17 0936  BP: (!) 142/67  Pulse: (!) 50  Resp: 16  SpO2: 99%  Weight: 187 lb (84.8 kg)  Height: 5\' 7"  (1.702 m)   Body mass index is 29.29 kg/m.  General: A&O x 3, WDWN, elderly female. Gait: slow, steady HENT: No gross abnormalities.  Eyes: PERRLA. Pulmonary: Respirations are non labored, CTAB, good air movement in all fields Cardiac: regular rhythm, bradycardic (on a beta blocker), no detected murmur.         Carotid Bruits Right Left   Negative Negative   Radial pulses are 2+ palpable bilaterally   Adominal aortic pulse is not palpable                         VASCULAR EXAM: Extremities without ischemic changes, without Gangrene; without open wounds. 1-2+ non pitting edema in lower legs, trace pitting edema. Moderate venous stasis changes, hemosiderin deposits.                                                                                                           LE Pulses Right Left  FEMORAL  2+ palpable  2+ palpable        POPLITEAL  not palpable   not palpable        POSTERIOR TIBIAL  not palpable   not palpable        DORSALIS PEDIS      ANTERIOR TIBIAL 2+ palpable  not palpable    Abdomen: soft, NT, no palpable masses. Skin: no rashes, no cellulitis, no ulcers noted. See Extremities.  Musculoskeletal: no muscle wasting or atrophy.  Neurologic: A&O X 3; appropriate affect, Sensation is normal; MOTOR FUNCTION:  moving all extremities equally, motor strength 5/5 throughout. Speech is fluent/normal. CN 2-12 intact. Psychiatric: Thought content is normal, mood appropriate for clinical situation.     ASSESSMENT: Tammy [name] is a 84 y.o. female who presents with: peripheral artery occlusive disease: normal in the right, moderate diease in the left. She also has chronic venous insufficiency in her lower extremities with moderate venous stasis changes. There are no open wounds, no signs of ischemia in her feet or legs.   Pt declined my offer of ordering a rolling walker, states she has a walker with wheels and a cane, she does not use either.   Daily seated leg exercises demonstrated and discussed.  Intermittent elevation of legs, see Patient Instructions.   DATA  ABI (Date: 12/28/2017):  R:   ABI: 1.07 (was 0.90),   PT: bi  DP: tri  TBI:  0.75, toe pressure 144, (was 0.99)  L:   ABI: 0.57 (was 0.39),   PT: mono  DP: mono  TBI: 0.40, toe pressure 77, (was 0.48) Improved bilateral ABI. Normal pressures on the right with bi and triphasic waveforms, moderate disease in the left with monophasic waveforms.     PLAN:  Based on the patient's vascular studies and examination, pt will return to clinic in 6 months with ABI's.   I discussed in depth with the patient the nature of atherosclerosis, and emphasized the importance of maximal medical management including strict control of blood pressure, blood glucose, and lipid levels, obtaining regular exercise, and continued cessation of smoking.  The patient is aware that without maximal  medical management the underlying atherosclerotic disease process will progress, limiting the benefit of any interventions.  The patient was given information about PAD including signs, symptoms, treatment, what symptoms should prompt the patient to seek immediate medical care, and risk reduction measures to take.  Clemon Chambers, RN, MSN, FNP-C [facility] Phone: [phone]  Clinic MD: Bishop Dublin  12/28/17 9:45 AM

## 2017-12-28 NOTE — Patient Instructions (Addendum)
To decrease swelling in your feet and legs: Elevate feet above slightly bent knees, feet above heart, overnight and 3-4 times per day for 20 minutes.    Chronic Venous Insufficiency Chronic venous insufficiency, also called venous stasis, is a condition that prevents blood from being pumped effectively through the veins in your legs. Blood may no longer be pumped effectively from the legs back to the heart. This condition can range from mild to severe. With proper treatment, you should be able to continue with an active life. What are the causes? Chronic venous insufficiency occurs when the vein walls become stretched, weakened, or damaged, or when valves within the vein are damaged. Some common causes of this include:  High blood pressure inside the veins (venous hypertension).  Increased blood pressure in the leg veins from long periods of sitting or standing.  A blood clot that blocks blood flow in a vein (deep vein thrombosis, DVT).  Inflammation of a vein (phlebitis) that causes a blood clot to form.  Tumors in the pelvis that cause blood to back up.  What increases the risk? The following factors may make you more likely to develop this condition:  Having a family history of this condition.  Obesity.  Pregnancy.  Living without enough physical activity or exercise (sedentary lifestyle).  Smoking.  Having a job that requires long periods of standing or sitting in one place.  Being a certain age. Women in their 40s and 50s and men in their 70s are more likely to develop this condition.  What are the signs or symptoms? Symptoms of this condition include:  Veins that are enlarged, bulging, or twisted (varicose veins).  Skin breakdown or ulcers.  Reddened or discolored skin on the front of the leg.  Brown, smooth, tight, and painful skin just above the ankle, usually on the inside of the leg (lipodermatosclerosis).  Swelling.  How is this diagnosed? This  condition may be diagnosed based on:  Your medical history.  A physical exam.  Tests, such as: ? A procedure that creates an image of a blood vessel and nearby organs and provides information about blood flow through the blood vessel (duplex ultrasound). ? A procedure that tests blood flow (plethysmography). ? A procedure to look at the veins using X-ray and dye (venogram).  How is this treated? The goals of treatment are to help you return to an active life and to minimize pain or disability. Treatment depends on the severity of your condition, and it may include:  Wearing compression stockings. These can help relieve symptoms and help prevent your condition from getting worse. However, they do not cure the condition.  Sclerotherapy. This is a procedure involving an injection of a material that "dissolves" damaged veins.  Surgery. This may involve: ? Removing a diseased vein (vein stripping). ? Cutting off blood flow through the vein (laser ablation surgery). ? Repairing a valve.  Follow these instructions at home:  Wear compression stockings as told by your health care provider. These stockings help to prevent blood clots and reduce swelling in your legs.  Take over-the-counter and prescription medicines only as told by your health care provider.  Stay active by exercising, walking, or doing different activities. Ask your health care provider what activities are safe for you and how much exercise you need.  Drink enough fluid to keep your urine clear or pale yellow.  Do not use any products that contain nicotine or tobacco, such as cigarettes and e-cigarettes. If you need help   quitting, ask your health care provider.  Keep all follow-up visits as told by your health care provider. This is important. Contact a health care provider if:  You have redness, swelling, or more pain in the affected area.  You see a red streak or line that extends up or down from the affected  area.  You have skin breakdown or a loss of skin in the affected area, even if the breakdown is small.  You get an injury in the affected area. Get help right away if:  You get an injury and an open wound in the affected area.  You have severe pain that does not get better with medicine.  You have sudden numbness or weakness in the foot or ankle below the affected area, or you have trouble moving your foot or ankle.  You have a fever and you have worse or persistent symptoms.  You have chest pain.  You have shortness of breath. Summary  Chronic venous insufficiency, also called venous stasis, is a condition that prevents blood from being pumped effectively through the veins in your legs.  Chronic venous insufficiency occurs when the vein walls become stretched, weakened, or damaged, or when valves within the vein are damaged.  Treatment for this condition depends on how severe your condition is, and it may involve wearing compression stockings or having a procedure.  Make sure you stay active by exercising, walking, or doing different activities. Ask your health care provider what activities are safe for you and how much exercise you need. This information is not intended to replace advice given to you by your health care provider. Make sure you discuss any questions you have with your health care provider. Document Released: 05/18/2006 Document Revised: 12/02/2015 Document Reviewed: 12/02/2015 Elsevier Interactive Patient Education  2017 Elsevier Inc.   Peripheral Vascular Disease Peripheral vascular disease (PVD) is a disease of the blood vessels that are not part of your heart and brain. A simple term for PVD is poor circulation. In most cases, PVD narrows the blood vessels that carry blood from your heart to the rest of your body. This can result in a decreased supply of blood to your arms, legs, and internal organs, like your stomach or kidneys. However, it most often affects a  person's lower legs and feet. There are two types of PVD.  Organic PVD. This is the more common type. It is caused by damage to the structure of blood vessels.  Functional PVD. This is caused by conditions that make blood vessels contract and tighten (spasm).  Without treatment, PVD tends to get worse over time. PVD can also lead to acute ischemic limb. This is when an arm or limb suddenly has trouble getting enough blood. This is a medical emergency. Follow these instructions at home:  Take medicines only as told by your doctor.  Do not use any tobacco products, including cigarettes, chewing tobacco, or electronic cigarettes. If you need help quitting, ask your doctor.  Lose weight if you are overweight, and maintain a healthy weight as told by your doctor.  Eat a diet that is low in fat and cholesterol. If you need help, ask your doctor.  Exercise regularly. Ask your doctor for some good activities for you.  Take good care of your feet. ? Wear comfortable shoes that fit well. ? Check your feet often for any cuts or sores. Contact a doctor if:  You have cramps in your legs while walking.  You have leg pain when   you are at rest.  You have coldness in a leg or foot.  Your skin changes.  You are unable to get or have an erection (erectile dysfunction).  You have cuts or sores on your feet that are not healing. Get help right away if:  Your arm or leg turns cold and blue.  Your arms or legs become red, warm, swollen, painful, or numb.  You have chest pain or trouble breathing.  You suddenly have weakness in your face, arm, or leg.  You become very confused or you cannot speak.  You suddenly have a very bad headache.  You suddenly cannot see. This information is not intended to replace advice given to you by your health care provider. Make sure you discuss any questions you have with your health care provider. Document Released: 04/08/2009 Document Revised: 06/20/2015  Document Reviewed: 06/22/2013 Elsevier Interactive Patient Education  2017 Elsevier Inc.  

## 2017-12-29 ENCOUNTER — Encounter: Payer: Self-pay | Admitting: Family

## 2018-01-06 DIAGNOSIS — M79605 Pain in left leg: Secondary | ICD-10-CM | POA: Diagnosis not present

## 2018-01-06 DIAGNOSIS — M545 Low back pain: Secondary | ICD-10-CM | POA: Diagnosis not present

## 2018-01-28 DIAGNOSIS — M79672 Pain in left foot: Secondary | ICD-10-CM | POA: Diagnosis not present

## 2018-01-28 DIAGNOSIS — M79605 Pain in left leg: Secondary | ICD-10-CM | POA: Diagnosis not present

## 2018-01-28 DIAGNOSIS — M545 Low back pain: Secondary | ICD-10-CM | POA: Diagnosis not present

## 2018-03-11 ENCOUNTER — Ambulatory Visit (INDEPENDENT_AMBULATORY_CARE_PROVIDER_SITE_OTHER): Payer: Medicare HMO | Admitting: Adult Health

## 2018-03-11 ENCOUNTER — Encounter: Payer: Self-pay | Admitting: Adult Health

## 2018-03-11 ENCOUNTER — Telehealth: Payer: Self-pay | Admitting: Adult Health

## 2018-03-11 ENCOUNTER — Other Ambulatory Visit: Payer: Self-pay | Admitting: Adult Health

## 2018-03-11 ENCOUNTER — Encounter: Payer: Medicare HMO | Admitting: Adult Health

## 2018-03-11 VITALS — BP 152/64 | Temp 98.1°F | Ht 67.0 in | Wt 177.0 lb

## 2018-03-11 DIAGNOSIS — E785 Hyperlipidemia, unspecified: Secondary | ICD-10-CM | POA: Diagnosis not present

## 2018-03-11 DIAGNOSIS — M79605 Pain in left leg: Secondary | ICD-10-CM

## 2018-03-11 DIAGNOSIS — I2699 Other pulmonary embolism without acute cor pulmonale: Secondary | ICD-10-CM | POA: Diagnosis not present

## 2018-03-11 DIAGNOSIS — E039 Hypothyroidism, unspecified: Secondary | ICD-10-CM | POA: Diagnosis not present

## 2018-03-11 DIAGNOSIS — I480 Paroxysmal atrial fibrillation: Secondary | ICD-10-CM

## 2018-03-11 DIAGNOSIS — I10 Essential (primary) hypertension: Secondary | ICD-10-CM | POA: Diagnosis not present

## 2018-03-11 LAB — LIPID PANEL
Cholesterol: 121 mg/dL (ref 0–200)
HDL: 61.8 mg/dL (ref >39.00)
LDL Cholesterol: 41 mg/dL (ref 0–99)
NonHDL: 58.93
Total CHOL/HDL Ratio: 2
Triglycerides: 91 mg/dL (ref 0.0–149.0)
VLDL: 18.2 mg/dL (ref 0.0–40.0)

## 2018-03-11 LAB — CBC WITH DIFFERENTIAL/PLATELET
Basophils Absolute: 0 10*3/uL (ref 0.0–0.1)
Basophils Relative: 0.5 % (ref 0.0–3.0)
Eosinophils Absolute: 0.3 10*3/uL (ref 0.0–0.7)
Eosinophils Relative: 3.3 % (ref 0.0–5.0)
HCT: 41.1 % (ref 36.0–46.0)
Hemoglobin: 13.6 g/dL (ref 12.0–15.0)
Lymphocytes Relative: 27 % (ref 12.0–46.0)
Lymphs Abs: 2.3 10*3/uL (ref 0.7–4.0)
MCHC: 33.2 g/dL (ref 30.0–36.0)
MCV: 99.2 fl (ref 78.0–100.0)
Monocytes Absolute: 0.8 10*3/uL (ref 0.1–1.0)
Monocytes Relative: 9.1 % (ref 3.0–12.0)
Neutro Abs: 5.1 10*3/uL (ref 1.4–7.7)
Neutrophils Relative %: 60.1 % (ref 43.0–77.0)
Platelets: 314 10*3/uL (ref 150.0–400.0)
RBC: 4.14 Mil/uL (ref 3.87–5.11)
RDW: 15.6 % — ABNORMAL HIGH (ref 11.5–15.5)
WBC: 8.6 10*3/uL (ref 4.0–10.5)

## 2018-03-11 LAB — COMPREHENSIVE METABOLIC PANEL
ALT: 26 U/L (ref 0–35)
AST: 26 U/L (ref 0–37)
Albumin: 4.4 g/dL (ref 3.5–5.2)
Alkaline Phosphatase: 68 U/L (ref 39–117)
BUN: 28 mg/dL — ABNORMAL HIGH (ref 6–23)
CO2: 26 mEq/L (ref 19–32)
Calcium: 9.9 mg/dL (ref 8.4–10.5)
Chloride: 105 mEq/L (ref 96–112)
Creatinine, Ser: 1.31 mg/dL — ABNORMAL HIGH (ref 0.40–1.20)
GFR: 38.62 mL/min — ABNORMAL LOW (ref >60.00)
Glucose, Bld: 122 mg/dL — ABNORMAL HIGH (ref 70–99)
Potassium: 4.6 mEq/L (ref 3.5–5.1)
Sodium: 143 mEq/L (ref 135–145)
TOTAL PROTEIN: 6.9 g/dL (ref 6.0–8.3)
Total Bilirubin: 0.7 mg/dL (ref 0.2–1.2)

## 2018-03-11 LAB — TSH: TSH: 2.63 u[IU]/mL (ref 0.35–4.50)

## 2018-03-11 MED ORDER — LEVOTHYROXINE SODIUM 25 MCG PO TABS: 25.0000 ug | ORAL_TABLET | Freq: Every day | ORAL | 3 refills | Status: DC

## 2018-03-11 NOTE — Telephone Encounter (Signed)
updated patient on her labs.   We talked about possibly going to physical therapy for her left hip pain but she refuses at that time.  She will work on home exercises and follow-up in 3 months or sooner if needed

## 2018-03-11 NOTE — Progress Notes (Signed)
Subjective:    Patient ID: Tammy [name], female    DOB: Aug 25, 1931, 85 y.o.   MRN: [id number]  HPI Patient presents for yearly preventative medicine examination. She is a pleasant 85 year old female who  has a past medical history of BREAST BIOPSY, HX OF (06/10/2006), Breast cyst, HIP PAIN, LEFT (01/31/2007), HYPERLIPIDEMIA (06/10/2006), HYPERTENSION (06/10/2006), Impaired glucose tolerance, NEOPLASMS UNSPEC NATURE BONE SOFT TISSUE&SKIN (05/31/2008), OSTEOPOROSIS (06/10/2006), and Pulmonary embolus ([place]).   PAF -is followed by cardiology.  Currently prescribed amiodarone and Eliquis  PVD-followed by vascular surgery.  On statin.  Has no complaints  H/o PE -Currently  on Eliquis   Hyperlipidemia -takes statin.  She denies myalgias Lab Results  Component Value Date   CHOL 135 11/25/2016   HDL 65 11/25/2016   LDLCALC 48 11/25/2016   TRIG 110 11/25/2016   CHOLHDL 2.1 11/25/2016   Hypothyroidism -[place] prescribed Synthroid 25 mcg. She reports that she has noticed that her hair has been becoming thin so she thought it from taking synthroid  Lab Results  Component Value Date   TSH 4.050 12/13/2017   Hypertension -takes Norvasc 10 mg and Toprol 25 mg daily. She has been taking Norvasc 5 mg.  BP Readings from Last 3 Encounters:  03/11/18 (!) 152/64  12/28/17 (!) 142/67  12/13/17 130/70   Left Hip Pain -he continues to have left-sided hip pain with radiating pain down her left leg.  This is been present since September.  She was seen by an orthopedist at [facility] who thought it was possibly an L5 nerve root irritation January 28, 2018 she had a steroid injection into the left great bursa.  She reports that this did not help much and to relieve her pain.  She was advised to come back as needed but has not followed up again.  Feels as though her pain is worse with ambulation and weightbearing.   All immunizations and health maintenance protocols were reviewed with the patient and needed  orders were placed. Vaccinations UTD  Appropriate screening laboratory values were ordered for the patient including screening of hyperlipidemia, renal function and hepatic function. If indicated by BPH, a PSA was ordered.  Medication reconciliation,  past medical history, social history, problem list and allergies were reviewed in detail with the patient  Goals were established with regard to weight loss, exercise, and  diet in compliance with medications  End of life planning was discussed.  Review of Systems  Constitutional: Negative.   HENT: Negative.   Eyes: Negative.   Respiratory: Negative.   Cardiovascular: Negative.   Gastrointestinal: Negative.   Endocrine: Negative.   Genitourinary: Negative.   Musculoskeletal: Positive for arthralgias and back pain. Negative for joint swelling and myalgias.  Skin: Negative.   Allergic/Immunologic: Negative.   Neurological: Negative.   Hematological: Negative.   Psychiatric/Behavioral: Negative.      Past Medical History:  Diagnosis Date  . BREAST BIOPSY, HX OF 06/10/2006  . Breast cyst   . HIP PAIN, LEFT 01/31/2007  . HYPERLIPIDEMIA 06/10/2006  . HYPERTENSION 06/10/2006  . Impaired glucose tolerance   . NEOPLASMS UNSPEC NATURE BONE SOFT TISSUE&SKIN 05/31/2008  . OSTEOPOROSIS 06/10/2006  . Pulmonary embolus [facility])     Social History   Socioeconomic History  . Marital status: Married    Spouse name: Not on file  . Number of children: 4  . Years of education: Not on file  . Highest education level: Not on file  Occupational History  . Not  on file  Social Needs  . Financial resource strain: Not on file  . Food insecurity:    Worry: Not on file    Inability: Not on file  . Transportation needs:    Medical: Not on file    Non-medical: Not on file  Tobacco Use  . Smoking status: Never Smoker  . Smokeless tobacco: Current User    Types: Snuff  Substance and Sexual Activity  . Alcohol use: No    Alcohol/week: 0.0 standard  drinks  . Drug use: No  . Sexual activity: Yes  Lifestyle  . Physical activity:    Days per week: Not on file    Minutes per session: Not on file  . Stress: Not on file  Relationships  . Social connections:    Talks on phone: Not on file    Gets together: Not on file    Attends religious service: Not on file    Active member of club or organization: Not on file    Attends meetings of clubs or organizations: Not on file    Relationship status: Not on file  . Intimate partner violence:    Fear of current or ex partner: Not on file    Emotionally abused: Not on file    Physically abused: Not on file    Forced sexual activity: Not on file  Other Topics Concern  . Not on file  Social History Narrative   She is retired    Worked in [employer] for 20 years    Married for 43 years    4 children - live locally.         Past Surgical History:  Procedure Laterality Date  . ABDOMINAL HYSTERECTOMY    . APPENDECTOMY    . BREAST SURGERY     bx x2  . CARDIOVERSION N/A 03/11/2017   Procedure: CARDIOVERSION;  Surgeon: Sanda Klein, MD;  Location: [facility];  Service: Cardiovascular;  Laterality: N/A;  . CHOLECYSTECTOMY  1980    Family History  Problem Relation [name] of Onset  . Heart disease Mother        Pacemaker  . Heart disease Father   . Asthma Father   . Diabetes Father   . Cancer Maternal Aunt        breast  . Heart disease Sister   . Hyperlipidemia Sister   . Hypertension Sister   . Lung cancer Sister     Allergies  Allergen Reactions  . Tuberculin Tests Other (See Comments)    Tuberculosis shot, red spot on arm    Current Outpatient Medications on File Prior to Visit  Medication Sig Dispense Refill  . acetaminophen (TYLENOL) 500 MG tablet Take 500 mg by mouth every 6 (six) hours as needed for moderate pain or headache.    Marland Kitchen amiodarone (PACERONE) 200 MG tablet Take 200 mg by mouth daily.    Marland Kitchen apixaban (ELIQUIS) 2.5 MG TABS tablet Take 1 tablet (2.5 mg  total) by mouth 2 (two) times daily. 180 tablet 3  . atorvastatin (LIPITOR) 20 MG tablet Take 1 tablet (20 mg total) by mouth daily at 6 PM. 90 tablet 1  . furosemide (LASIX) 20 MG tablet Take 1 tablet (20 mg total) by mouth daily. 90 tablet 3  . levothyroxine (SYNTHROID, LEVOTHROID) 25 MCG tablet TAKE 1 TABLET BY MOUTH EVERY DAY 90 tablet 1  . metoprolol succinate (TOPROL-XL) 25 MG 24 hr tablet Take 1 tablet (25 mg total) by mouth daily. 90 tablet 3  .  amLODipine (NORVASC) 10 MG tablet Take 1 tablet (10 mg total) by mouth daily. 30 tablet 6   No current facility-administered medications on file prior to visit.     BP (!) 152/64   Temp 98.1 F (36.7 C)   Ht 5\' 7"  (1.702 m)   Wt 177 lb (80.3 kg)   BMI 27.72 kg/m       Objective:   Physical Exam Vitals signs and nursing note reviewed.  Constitutional:      General: She is not in acute distress.    Appearance: Normal appearance. She is well-developed and normal weight.  HENT:     Head: Normocephalic and atraumatic.     Right Ear: Tympanic membrane, ear canal and external ear normal. There is no impacted cerumen.     Left Ear: Tympanic membrane, ear canal and external ear normal. There is no impacted cerumen.     Nose: Nose normal. No congestion or rhinorrhea.     Mouth/Throat:     Mouth: Mucous membranes are moist.     Dentition: Abnormal dentition.     Pharynx: Oropharynx is clear. No oropharyngeal exudate or posterior oropharyngeal erythema.  Eyes:     General: No scleral icterus.       Right eye: No discharge.        Left eye: No discharge.     Extraocular Movements: Extraocular movements intact.     Conjunctiva/sclera: Conjunctivae normal.     Pupils: Pupils are equal, round, and reactive to light.  Neck:     Musculoskeletal: Normal range of motion and neck supple. No neck rigidity or muscular tenderness.     Thyroid: No thyromegaly.     Vascular: No carotid bruit.     Trachea: No tracheal deviation.  Cardiovascular:       Rate and Rhythm: Normal rate and regular rhythm.     Pulses: Normal pulses.     Heart sounds: Normal heart sounds. No murmur. No friction rub. No gallop.   Pulmonary:     Effort: Pulmonary effort is normal. No respiratory distress.     Breath sounds: Normal breath sounds. No stridor. No wheezing, rhonchi or rales.  Chest:     Chest wall: No tenderness.  Abdominal:     General: Bowel sounds are normal. There is no distension.     Palpations: Abdomen is soft. There is no mass.     Tenderness: There is no abdominal tenderness. There is no right CVA tenderness, left CVA tenderness, guarding or rebound.     Hernia: No hernia is present.  Musculoskeletal: Normal range of motion.        General: Tenderness (left hip and left leg) present. No swelling, deformity or signs of injury.     Right lower leg: Edema (trace) present.     Left lower leg: Edema (trace) present.  Lymphadenopathy:     Cervical: No cervical adenopathy.  Skin:    General: Skin is warm and dry.     Coloration: Skin is not jaundiced or pale.     Findings: No bruising, erythema, lesion or rash.  Neurological:     General: No focal deficit present.     Mental Status: She is alert and oriented to person, place, and time. Mental status is at baseline.     Cranial Nerves: No cranial nerve deficit.     Sensory: No sensory deficit.     Motor: No weakness.     Coordination: Coordination normal.     Gait:  Gait normal.     Deep Tendon Reflexes: Reflexes normal.  Psychiatric:        Mood and Affect: Mood normal.        Behavior: Behavior normal.        Thought Content: Thought content normal.        Judgment: Judgment normal.       Assessment & Plan:  1. Essential hypertension -BP not at baseline today.  Advised to go back to taking 10 mg tabs of Norvasc.  No medication changes at this time - CBC with Differential/Platelet - Comprehensive metabolic panel - Lipid panel - TSH  2. Dyslipidemia Continue with Lipitor 20  mg - CBC with Differential/Platelet - Comprehensive metabolic panel - Lipid panel - TSH  3. Paroxysmal atrial fibrillation ([facility]) -Normal sinus rhythm today.  Continue with Eliquis and amiodarone - CBC with Differential/Platelet - Comprehensive metabolic panel - Lipid panel - TSH  4. Embolism, pulmonary with infarction ([place]) -Continue with Eliquis  5. Pain of left lower extremity -Discussed that physical therapy with the patient.  She will think about it and let me know  6. Hypothyroidism, unspecified type -Talked about Synthroid and likely her dose was not strong enough which was why she was seen thinning hair..  Likely increase Synthroid to 50 mcg - CBC with Differential/Platelet - Comprehensive metabolic panel - Lipid panel - TSH  [name], NP

## 2018-03-17 ENCOUNTER — Other Ambulatory Visit: Payer: Self-pay | Admitting: Internal Medicine

## 2018-03-25 NOTE — Telephone Encounter (Signed)
Pt husband called in and wanted to check the status of this med?

## 2018-03-25 NOTE — Telephone Encounter (Signed)
Sent to the pharmacy by e-scribe. 

## 2018-03-28 ENCOUNTER — Telehealth: Payer: Self-pay | Admitting: Adult Health

## 2018-03-28 DIAGNOSIS — E785 Hyperlipidemia, unspecified: Secondary | ICD-10-CM

## 2018-03-28 NOTE — Telephone Encounter (Signed)
Copied from [place] [id number]. Topic: Quick Communication - See Telephone Encounter >> Mar 28, 2018  8:58 AM Ivar Drape wrote: CRM for notification. See Telephone encounter for: 03/28/18. Patient would like a refill on the following prescriptions and have them sent to her preferred pharmacy [facility] on [address]  1) furosemide (LASIX) 20 MG tablet  2) metoprolol succinate (TOPROL-XL) 25 MG 24 hr tablet  3) amLODipine (BESYLATE) 10 MG tablet

## 2018-03-28 NOTE — Telephone Encounter (Signed)
Pt has refills of Furosemide and Metoprolol available at requested pharmacy. Pt will need a new prescription for Amlodipine 10 mg tab due to current prescription being expired.

## 2018-03-29 MED ORDER — AMLODIPINE BESYLATE 10 MG PO TABS: 10.0000 mg | ORAL_TABLET | Freq: Every day | ORAL | 3 refills | Status: DC

## 2018-03-29 MED ORDER — ATORVASTATIN CALCIUM 20 MG PO TABS: 20.0000 mg | ORAL_TABLET | Freq: Every day | ORAL | 3 refills | Status: DC

## 2018-03-29 NOTE — Telephone Encounter (Signed)
Sent to the pharmacy by e-scribe. 

## 2018-03-29 NOTE — Telephone Encounter (Signed)
That is fine 

## 2018-03-29 NOTE — Telephone Encounter (Signed)
Dr. Stanford Breed in the prescriber of Norvasc.  You instructed her to take 10 mg during last ov.  Ok to send in or send to card?

## 2018-03-30 ENCOUNTER — Other Ambulatory Visit: Payer: Self-pay | Admitting: Adult Health

## 2018-03-30 NOTE — Telephone Encounter (Signed)
Filled on 03/25/2018 for 1 year.  Message sent to the pharmacy.

## 2018-04-01 ENCOUNTER — Other Ambulatory Visit: Payer: Self-pay | Admitting: Internal Medicine

## 2018-04-01 NOTE — Telephone Encounter (Signed)
Patient's husband states the pharmacy keeps telling him they do not have a script for metoprolol succinate (TOPROL-XL) 25 MG 24 hr tablet. It was sent on 2/28 and confirmed by pharmacy at 11:56 am. Please advise.

## 2018-04-01 NOTE — Telephone Encounter (Signed)
Rx says "phoned in".

## 2018-04-01 NOTE — Telephone Encounter (Signed)
See refill request.  Medication was called to the pharmacist and given verbally.  Nothing further needed.

## 2018-04-01 NOTE — Telephone Encounter (Signed)
Called and spoke to the pharmacy.  They did not receive the rx sent in on 03/25/2018.  Rx gave verbally by telephone.  Nothing further needed.

## 2018-04-01 NOTE — Telephone Encounter (Signed)
Pt husband called and stated that pharmacy has not received medication. Please advise

## 2018-04-05 DIAGNOSIS — M9905 Segmental and somatic dysfunction of pelvic region: Secondary | ICD-10-CM | POA: Diagnosis not present

## 2018-04-05 DIAGNOSIS — M9902 Segmental and somatic dysfunction of thoracic region: Secondary | ICD-10-CM | POA: Diagnosis not present

## 2018-04-05 DIAGNOSIS — S39012A Strain of muscle, fascia and tendon of lower back, initial encounter: Secondary | ICD-10-CM | POA: Diagnosis not present

## 2018-04-05 DIAGNOSIS — M5432 Sciatica, left side: Secondary | ICD-10-CM | POA: Diagnosis not present

## 2018-04-05 DIAGNOSIS — S29012A Strain of muscle and tendon of back wall of thorax, initial encounter: Secondary | ICD-10-CM | POA: Diagnosis not present

## 2018-04-05 DIAGNOSIS — M9903 Segmental and somatic dysfunction of lumbar region: Secondary | ICD-10-CM | POA: Diagnosis not present

## 2018-04-07 DIAGNOSIS — M9905 Segmental and somatic dysfunction of pelvic region: Secondary | ICD-10-CM | POA: Diagnosis not present

## 2018-04-07 DIAGNOSIS — M9903 Segmental and somatic dysfunction of lumbar region: Secondary | ICD-10-CM | POA: Diagnosis not present

## 2018-04-07 DIAGNOSIS — S29012A Strain of muscle and tendon of back wall of thorax, initial encounter: Secondary | ICD-10-CM | POA: Diagnosis not present

## 2018-04-07 DIAGNOSIS — M9902 Segmental and somatic dysfunction of thoracic region: Secondary | ICD-10-CM | POA: Diagnosis not present

## 2018-04-07 DIAGNOSIS — M5432 Sciatica, left side: Secondary | ICD-10-CM | POA: Diagnosis not present

## 2018-04-07 DIAGNOSIS — S39012A Strain of muscle, fascia and tendon of lower back, initial encounter: Secondary | ICD-10-CM | POA: Diagnosis not present

## 2018-04-12 DIAGNOSIS — S29012A Strain of muscle and tendon of back wall of thorax, initial encounter: Secondary | ICD-10-CM | POA: Diagnosis not present

## 2018-04-12 DIAGNOSIS — M5432 Sciatica, left side: Secondary | ICD-10-CM | POA: Diagnosis not present

## 2018-04-12 DIAGNOSIS — S39012A Strain of muscle, fascia and tendon of lower back, initial encounter: Secondary | ICD-10-CM | POA: Diagnosis not present

## 2018-04-12 DIAGNOSIS — M9902 Segmental and somatic dysfunction of thoracic region: Secondary | ICD-10-CM | POA: Diagnosis not present

## 2018-04-12 DIAGNOSIS — M9903 Segmental and somatic dysfunction of lumbar region: Secondary | ICD-10-CM | POA: Diagnosis not present

## 2018-04-12 DIAGNOSIS — M9905 Segmental and somatic dysfunction of pelvic region: Secondary | ICD-10-CM | POA: Diagnosis not present

## 2018-04-14 DIAGNOSIS — S39012A Strain of muscle, fascia and tendon of lower back, initial encounter: Secondary | ICD-10-CM | POA: Diagnosis not present

## 2018-04-14 DIAGNOSIS — M5432 Sciatica, left side: Secondary | ICD-10-CM | POA: Diagnosis not present

## 2018-04-14 DIAGNOSIS — S29012A Strain of muscle and tendon of back wall of thorax, initial encounter: Secondary | ICD-10-CM | POA: Diagnosis not present

## 2018-04-14 DIAGNOSIS — M9903 Segmental and somatic dysfunction of lumbar region: Secondary | ICD-10-CM | POA: Diagnosis not present

## 2018-04-14 DIAGNOSIS — M9902 Segmental and somatic dysfunction of thoracic region: Secondary | ICD-10-CM | POA: Diagnosis not present

## 2018-04-14 DIAGNOSIS — M9905 Segmental and somatic dysfunction of pelvic region: Secondary | ICD-10-CM | POA: Diagnosis not present

## 2018-04-26 ENCOUNTER — Other Ambulatory Visit: Payer: Self-pay | Admitting: Cardiology

## 2018-05-30 ENCOUNTER — Other Ambulatory Visit: Payer: Self-pay

## 2018-05-30 ENCOUNTER — Telehealth: Payer: Self-pay

## 2018-05-30 DIAGNOSIS — I779 Disorder of arteries and arterioles, unspecified: Secondary | ICD-10-CM

## 2018-05-30 NOTE — Telephone Encounter (Signed)
Pt called asking to reschedule her OV.    Called her back and she said that she would like to be seen earlier than her original appt. She said that she is having left lower leg pain and it has been swollen a couple of months. She said that she just didn't want to mention it to anyone because she was afraid to come out with the Covid stuff.   She said that she has had no injury but the leg is red, swelling and has now started to peel.   Appt has been moved up till this week with Vinnie Level.   York Cerise, CMA

## 2018-06-01 ENCOUNTER — Telehealth (HOSPITAL_COMMUNITY): Payer: Self-pay | Admitting: Rehabilitation

## 2018-06-01 NOTE — Telephone Encounter (Signed)
The above patient or their representative was contacted and gave the following answers to these questions:         Do you have any of the following symptoms? No  Fever                    Cough                   Shortness of breath  Do  you have any of the following other symptoms? No    muscle pain         vomiting,        diarrhea        rash         weakness        red eye        abdominal pain         bruising          bruising or bleeding              joint pain           severe headache    Have you been in contact with someone who was or has been sick in the past 2 weeks? No  Yes                 Unsure                         Unable to assess   Does the person that you were in contact with have any of the following symptoms?   Cough         shortness of breath           muscle pain         vomiting,            diarrhea            rash            weakness           fever            red eye           abdominal pain           bruising  or  bleeding                joint pain                severe headache               Have you  or someone you have been in contact with traveled internationally in th last month? No        If yes, which countries?   Have you  or someone you have been in contact with traveled outside [place] in th last month?  No       If yes, which state and city?   COMMENTS OR ACTION PLAN FOR THIS PATIENT:  Pt instructed to wear mask to appt        

## 2018-06-02 ENCOUNTER — Ambulatory Visit (INDEPENDENT_AMBULATORY_CARE_PROVIDER_SITE_OTHER)
Admission: RE | Admit: 2018-06-02 | Discharge: 2018-06-02 | Disposition: A | Discharge status: Home / Self Care | Payer: Medicare HMO | Source: Ambulatory Visit | Attending: Family | Admitting: Family

## 2018-06-02 ENCOUNTER — Other Ambulatory Visit (HOSPITAL_COMMUNITY): Payer: Self-pay | Admitting: Family

## 2018-06-02 ENCOUNTER — Ambulatory Visit (HOSPITAL_COMMUNITY)
Admission: RE | Admit: 2018-06-02 | Discharge: 2018-06-02 | Disposition: A | Discharge status: Home / Self Care | Payer: Medicare HMO | Source: Ambulatory Visit | Attending: Family | Admitting: Family

## 2018-06-02 ENCOUNTER — Other Ambulatory Visit: Payer: Self-pay

## 2018-06-02 ENCOUNTER — Encounter: Payer: Self-pay | Admitting: Family

## 2018-06-02 ENCOUNTER — Ambulatory Visit: Payer: Medicare HMO | Admitting: Family

## 2018-06-02 VITALS — BP 129/63 | HR 55 | Temp 98.1°F | Resp 20 | Ht 67.0 in | Wt 183.6 lb

## 2018-06-02 DIAGNOSIS — I779 Disorder of arteries and arterioles, unspecified: Secondary | ICD-10-CM | POA: Diagnosis not present

## 2018-06-02 DIAGNOSIS — R609 Edema, unspecified: Secondary | ICD-10-CM

## 2018-06-02 DIAGNOSIS — I872 Venous insufficiency (chronic) (peripheral): Secondary | ICD-10-CM | POA: Diagnosis not present

## 2018-06-02 DIAGNOSIS — Z720 Tobacco use: Secondary | ICD-10-CM

## 2018-06-02 DIAGNOSIS — M79662 Pain in left lower leg: Secondary | ICD-10-CM

## 2018-06-02 DIAGNOSIS — R0609 Other forms of dyspnea: Secondary | ICD-10-CM

## 2018-06-02 NOTE — Patient Instructions (Addendum)
Smokeless Tobacco Information, Adult Tobacco use is one of the leading causes of cancer and other chronic health problems. Smokeless tobacco is tobacco that is put directly into the mouth instead of being smoked. It may also be called chewing tobacco or snuff. Smokeless tobacco is made from the leaves of tobacco plants and it comes in several forms:  Loose, dry leaves, plugs, or twists.  Moist pouches.  Dissolving lozenges or strips. Chewing, sucking, or holding the tobacco in your mouth causes your mouth to make more saliva. The saliva mixes with the tobacco to make "tobacco juice" that is swallowed or spit out. How can smokeless tobacco affect me? Using smokeless tobacco:  Increases your risk of developing cancer. Smokeless tobacco contains at least 28 different types of cancer-causing chemicals (carcinogens).  Increases your chances of developing other long-term health problems, including high blood pressure, heart disease, stroke, and dental problems.  Can make you become addicted. Nicotine is one of the chemicals in tobacco. When you chew tobacco, you absorb nicotine from the tobacco juice. This can make you feel more alert than usual.  Can cause problems with pregnancy. Pregnant women who use smokeless tobacco are more likely to miscarry or deliver a baby too early (premature delivery).  Can affect the appearance and health of your mouth. Using smokeless tobacco may cause bad breath, yellow-brown teeth, mouth sores, cracking and bleeding lips, gum recession, and lesions on the soft tissues of your mouth (leukoplakia). What are the benefits of not using smokeless tobacco? The benefits of not using smokeless tobacco include:  A healthy mind because: ? You avoid addiction.  A healthy body because: ? You avoid dental problems. ? You promote healthy pregnancy. ? You avoid long-term health problems.  A healthy wallet because: ? You avoid costs of buying tobacco. ? You avoid health  care costs in the future.  A healthy family because: ? You avoid accidental poisoning of children in your household. What can happen if I continue to use smokeless tobacco? If you continue to use smokeless tobacco, you will increase your risk for developing certain cancers. These include:  Tongue.  Lips, mouth, and gums.  Throat (esophagus) and voice box (larynx).  Stomach.  Pancreas.  Bladder.  Colon. Long-term use of smokeless tobacco can also lead to:  High blood pressure, heart disease, and stroke.  Gum disease, gum recession, and bone loss around the teeth.  Tooth decay. How do I quit using smokeless tobacco? Quitting the use of smokeless tobacco can be hard, but it can be done. Follow these steps:  Pick a date to quit. Set a date within the next two weeks. This gives you time to prepare.  Write down the reasons why you are quitting. Keep this list in places where you will see it often, such as on your bathroom mirror or in your car or wallet.  Identify the people, places, things, and activities that make you want to use tobacco (triggers) and avoid them.  Get rid of any tobacco you have and remove any tobacco smells. To do this: ? Throw away all containers of tobacco at home, at work, and in your car. ? Throw away any other items that you use regularly when you chew tobacco. ? Clean your car and make sure to remove all tobacco-related items. ? Clean your home, including curtains and carpets.  Tell your family, friends, and coworkers that you are quitting. This can make quitting easier.  Ask your health care provider for help quitting  smokeless tobacco. This may involve treatment. Find out what treatment options are covered by your health insurance.  Keep track of how many days have passed since you quit. Remembering how long and hard you have worked to quit can help you avoid using tobacco again. Where can I get support? Ask your health care provider if there is  a local support group for quitting smokeless tobacco. Where can I get more information? You can learn more about the risks of using smokeless tobacco and the benefits of quitting from these sources:  [place]: www.cancer.gov  American [employer]: www.cancer.org When should I seek medical care? Seek medical care if you have:  White or other discolored patches in your mouth.  Difficulty swallowing.  A change in your voice.  Unexplained weight loss.  Stomach pain, nausea, or vomiting. Summary  Smokeless tobacco contains at least 28 different chemicals that are known to cause cancer (carcinogen).  Nicotine is an addictive chemical in smokeless tobacco.  When you quit using smokeless tobacco, you lower your risk of developing cancer. This information is not intended to replace advice given to you by your health care provider. Make sure you discuss any questions you have with your health care provider. Document Released: 06/16/2010 Document Revised: 08/28/2016 Document Reviewed: 08/24/2014 Elsevier Interactive Patient Education  2019 [place].     Peripheral Vascular Disease  Peripheral vascular disease (PVD) is a disease of the blood vessels that are not part of your heart and brain. A simple term for PVD is poor circulation. In most cases, PVD narrows the blood vessels that carry blood from your heart to the rest of your body. This can reduce the supply of blood to your arms, legs, and internal organs, like your stomach or kidneys. However, PVD most often affects a person's lower legs and feet. Without treatment, PVD tends to get worse. PVD can also lead to acute ischemic limb. This is when an arm or leg suddenly cannot get enough blood. This is a medical emergency. Follow these instructions at home: Lifestyle  Do not use any products that contain nicotine or tobacco, such as cigarettes and e-cigarettes. If you need help quitting, ask your doctor.  Lose  weight if you are overweight. Or, stay at a healthy weight as told by your doctor.  Eat a diet that is low in fat and cholesterol. If you need help, ask your doctor.  Exercise regularly. Ask your doctor for activities that are right for you. General instructions  Take over-the-counter and prescription medicines only as told by your doctor.  Take good care of your feet: ? Wear comfortable shoes that fit well. ? Check your feet often for any cuts or sores.  Keep all follow-up visits as told by your doctor This is important. Contact a doctor if:  You have cramps in your legs when you walk.  You have leg pain when you are at rest.  You have coldness in a leg or foot.  Your skin changes.  You are unable to get or have an erection (erectile dysfunction).  You have cuts or sores on your feet that do not heal. Get help right away if:  Your arm or leg turns cold, numb, and blue.  Your arms or legs become red, warm, swollen, painful, or numb.  You have chest pain.  You have trouble breathing.  You suddenly have weakness in your face, arm, or leg.  You become very confused or you cannot speak.  You suddenly  have a very bad headache.  You suddenly cannot see. Summary  Peripheral vascular disease (PVD) is a disease of the blood vessels.  A simple term for PVD is poor circulation. Without treatment, PVD tends to get worse.  Treatment may include exercise, low fat and low cholesterol diet, and quitting smoking. This information is not intended to replace advice given to you by your health care provider. Make sure you discuss any questions you have with your health care provider. Document Released: 04/08/2009 Document Revised: 02/20/2016 Document Reviewed: 02/20/2016 Elsevier Interactive Patient Education  2019 [employer]     Chronic Venous Insufficiency Chronic venous insufficiency, also called venous stasis, is a condition that prevents blood from being pumped  effectively through the veins in your legs. Blood may no longer be pumped effectively from the legs back to the heart. This condition can range from mild to severe. With proper treatment, you should be able to continue with an active life. What are the causes? Chronic venous insufficiency occurs when the vein walls become stretched, weakened, or damaged, or when valves within the vein are damaged. Some common causes of this include:  High blood pressure inside the veins (venous hypertension).  Increased blood pressure in the leg veins from long periods of sitting or standing.  A blood clot that blocks blood flow in a vein (deep vein thrombosis, DVT).  Inflammation of a vein (phlebitis) that causes a blood clot to form.  Tumors in the pelvis that cause blood to back up. What increases the risk? The following factors may make you more likely to develop this condition:  Having a family history of this condition.  Obesity.  Pregnancy.  Living without enough physical activity or exercise (sedentary lifestyle).  Smoking.  Having a job that requires long periods of standing or sitting in one place.  Being a certain age. Women in their 28s and 18s and men in their 69s are more likely to develop this condition. What are the signs or symptoms? Symptoms of this condition include:  Veins that are enlarged, bulging, or twisted (varicose veins).  Skin breakdown or ulcers.  Reddened or discolored skin on the front of the leg.  Brown, smooth, tight, and painful skin just above the ankle, usually on the inside of the leg (lipodermatosclerosis).  Swelling. How is this diagnosed? This condition may be diagnosed based on:  Your medical history.  A physical exam.  Tests, such as: ? A procedure that creates an image of a blood vessel and nearby organs and provides information about blood flow through the blood vessel (duplex ultrasound). ? A procedure that tests blood flow  (plethysmography). ? A procedure to look at the veins using X-ray and dye (venogram). How is this treated? The goals of treatment are to help you return to an active life and to minimize pain or disability. Treatment depends on the severity of your condition, and it may include:  Wearing compression stockings. These can help relieve symptoms and help prevent your condition from getting worse. However, they do not cure the condition.  Sclerotherapy. This is a procedure involving an injection of a material that "dissolves" damaged veins.  Surgery. This may involve: ? Removing a diseased vein (vein stripping). ? Cutting off blood flow through the vein (laser ablation surgery). ? Repairing a valve. Follow these instructions at home:      Wear compression stockings as told by your health care provider. These stockings help to prevent blood clots and reduce swelling in your  legs.  Take over-the-counter and prescription medicines only as told by your health care provider.  Stay active by exercising, walking, or doing different activities. Ask your health care provider what activities are safe for you and how much exercise you need.  Drink enough fluid to keep your urine clear or pale yellow.  Do not use any products that contain nicotine or tobacco, such as cigarettes and e-cigarettes. If you need help quitting, ask your health care provider.  Keep all follow-up visits as told by your health care provider. This is important. Contact a health care provider if:  You have redness, swelling, or more pain in the affected area.  You see a red streak or line that extends up or down from the affected area.  You have skin breakdown or a loss of skin in the affected area, even if the breakdown is small.  You get an injury in the affected area. Get help right away if:  You get an injury and an open wound in the affected area.  You have severe pain that does not get better with medicine.   You have sudden numbness or weakness in the foot or ankle below the affected area, or you have trouble moving your foot or ankle.  You have a fever and you have worse or persistent symptoms.  You have chest pain.  You have shortness of breath. Summary  Chronic venous insufficiency, also called venous stasis, is a condition that prevents blood from being pumped effectively through the veins in your legs.  Chronic venous insufficiency occurs when the vein walls become stretched, weakened, or damaged, or when valves within the vein are damaged.  Treatment for this condition depends on how severe your condition is, and it may involve wearing compression stockings or having a procedure.  Make sure you stay active by exercising, walking, or doing different activities. Ask your health care provider what activities are safe for you and how much exercise you need. This information is not intended to replace advice given to you by your health care provider. Make sure you discuss any questions you have with your health care provider. Document Released: 05/18/2006 Document Revised: 12/02/2015 Document Reviewed: 12/02/2015 Elsevier Interactive Patient Education  2019 [employer].     To decrease swelling in your feet and legs: Elevate feet above slightly bent knees, feet above heart, overnight and 3-4 times per day for 20 minutes.

## 2018-06-02 NOTE — Progress Notes (Signed)
[facility] [place]   CC: Follow up peripheral artery occlusive disease  History of Present Illness Tammy [name] is a 85 y.o. female who presents for evaluation of peripheral artery occlusive disease. This patient has evidence of infrainguinal arterial occlusive disease on the leftandevidence of chronic venous insufficiency bilaterally.  She has been evaluated by Dr. Scot Dock on previous visits.   She denies any nonhealing ulcers. Previous ABI's were right 100 % triphasic flow, left 65% monophasic.  She and her husband do the shopping together and she was able to walk around the store without difficulty holding on to the shopping cart for support, but since about February 2020 she is no longer able to do this.  She has developed lateral right calf pain starting about February 2020; pain is at rest, worse with walking, worsening in general. She saw Dr. Gladstone Lighter about May of 2019, and states she was told that she has back problems and bursitis in her left hip, cortisone injection in her hip helped for a short while.  She also tried chiropractic which she states did not help. Her PCP's last note indicated that he could refer her to physical therapy if she likes.   Past medical history includesHTN,Pulmonary embolus managed with chronic Eliquis, A fib and dislidemia managed with Lipitor.   Serum creatinine was 1.31 on 03-11-18, GFR at that time 38, stage 3b CKD.   She is not wearing her zip up knee high compression hose as they are too tight; she is elevating her legs above her heart often. She does take 20 mg Lasix daily.   Diabetic: no A1C result on file, glucose 103-151 from 5-220-19 to 12-13-17, impaired glucose tolerance  Tobacco use: non-smoker, smokeless tobacco use since preteen.   Pt meds include: Statin :Yes Betablocker: Yes ASA: No Other anticoagulants/antiplatelets: Eliquis 2.5 mg bid, hx of left leg DVT and PE, and paroxysmal atrial fib; successful  cardioversion to sinus rhythm February 2019 (review of Dr. Stanford Breed note dated 12-13-17). She has continued on her Eliquis with no missed doses.    Past Medical History:  Diagnosis Date  . BREAST BIOPSY, HX OF 06/10/2006  . Breast cyst   . HIP PAIN, LEFT 01/31/2007  . HYPERLIPIDEMIA 06/10/2006  . HYPERTENSION 06/10/2006  . Impaired glucose tolerance   . NEOPLASMS UNSPEC NATURE BONE SOFT TISSUE&SKIN 05/31/2008  . OSTEOPOROSIS 06/10/2006  . Pulmonary embolus [facility])     Social History Social History   Tobacco Use  . Smoking status: Never Smoker  . Smokeless tobacco: Current User    Types: Snuff  Substance Use Topics  . Alcohol use: No    Alcohol/week: 0.0 standard drinks  . Drug use: No    Family History Family History  Problem Relation Age of Onset  . Heart disease Mother        Pacemaker  . Heart disease Father   . Asthma Father   . Diabetes Father   . Cancer Maternal Aunt        breast  . Heart disease Sister   . Hyperlipidemia Sister   . Hypertension Sister   . Lung cancer Sister     Past Surgical History:  Procedure Laterality Date  . ABDOMINAL HYSTERECTOMY    . APPENDECTOMY    . BREAST SURGERY     bx x2  . CARDIOVERSION N/A 03/11/2017   Procedure: CARDIOVERSION;  Surgeon: Sanda Klein, MD;  Location: [facility];  Service: Cardiovascular;  Laterality: N/A;  . CHOLECYSTECTOMY  1980  Allergies  Allergen Reactions  . Tuberculin Tests Other (See Comments)    Tuberculosis shot, red spot on arm    Current Outpatient Medications  Medication Sig Dispense Refill  . acetaminophen (TYLENOL) 500 MG tablet Take 500 mg by mouth every 6 (six) hours as needed for moderate pain or headache.    Marland Kitchen amiodarone (PACERONE) 200 MG tablet TAKE ONE TABLET TWICE DAILY FOR ONE WEEK THEN DECREASE TO 1 TABLET DAILY 135 tablet 2  . amLODipine (NORVASC) 10 MG tablet Take 1 tablet (10 mg total) by mouth daily. 90 tablet 3  . apixaban (ELIQUIS) 2.5 MG TABS tablet Take 1 tablet (2.5 mg  total) by mouth 2 (two) times daily. 180 tablet 3  . atorvastatin (LIPITOR) 20 MG tablet Take 1 tablet (20 mg total) by mouth daily at 6 PM. 90 tablet 3  . furosemide (LASIX) 20 MG tablet Take 1 tablet (20 mg total) by mouth daily. 90 tablet 3  . levothyroxine (SYNTHROID, LEVOTHROID) 25 MCG tablet Take 1 tablet (25 mcg total) by mouth daily. 90 tablet 3  . metoprolol succinate (TOPROL-XL) 25 MG 24 hr tablet TAKE 1 TABLET BY MOUTH EVERY DAY 90 tablet 3   No current facility-administered medications for this visit.     ROS: See HPI for pertinent positives and negatives.   Physical Examination  Vitals:   06/02/18 1439  BP: 129/63  Pulse: (!) 55  Resp: 20  Temp: 98.1 F (36.7 C)  SpO2: 96%  Weight: 183 lb 9.6 oz (83.3 kg)  Height: 5\' 7"  (1.702 m)   Body mass index is 28.76 kg/m.  General: A&O x 3, WDWN, elderly female, accompanied by her husband.  Gait: antalgic HENT: No gross abnormalities.  Eyes: PERRLA. Pulmonary: Respirations are non labored, CTAB, fair air movement in all fields, few rales in right base. Cardiac: regular rhythm, bradycardic (on a beta blocker), no detected murmur.         Carotid Bruits Right Left   Negative Negative   Radial pulses are 1+ palpable bilaterally   Adominal aortic pulse is not palpable                         VASCULAR EXAM: Extremities without ischemic changes, without Gangrene; without open wounds. 2+ non pitting edema in lower legs, trace pitting edema. Moderate venous stasis changes, hemosiderin deposits.                                                                                                           LE Pulses Right Left       FEMORAL  2+ palpable  2+ palpable        POPLITEAL  not palpable   not palpable       POSTERIOR TIBIAL  not palpable   not palpable        DORSALIS PEDIS      ANTERIOR TIBIAL 1+ palpable  not palpable    Abdomen: soft, NT, no palpable masses. Skin: no rashes, no cellulitis, no ulcers noted.  See Extremities.  Musculoskeletal: no muscle wasting or atrophy.  Neurologic: A&O X 3; appropriate affect, Sensation is normal; MOTOR FUNCTION:  moving all extremities equally, motor strength 5/5 in upper extremities, 4/5 in right LE, 3/5 in left LE (limited by pain). Pain elicited in left lateral ankle with resistance against left knee raise. Speech is fluent/normal. CN 2-12 intact. Psychiatric: Thought content is normal, mood appropriate for clinical situation.    ASSESSMENT: Tammy [name] is a 85 y.o. female who presents with PAOD and chronic venous insufficiency.  Left lateral calf pain at rest: She had been walking fairly well until about February 2020 when she developed progressively worsening left lateral calf pain at rest, worse with walking. She states her dyspnea and left leg pain limit her walking. She has remained on Eliquis for a hx of DVT and PE, and atrial fib.  Venous duplex today of left lower extremity shows no evidence of DVT.  Worsening dyspnea: remain on Eliquis and Lasix, I advised pt notify her PCP and cardiologist today or tomorrow of this.   She was evaluated by a spine specialist, told that she had a problem in her lower back that could not be helped, and that she had bursitis in her left hip which was temporarily helped by a cortisone injection.  Referral to Dr. Erline Levine, neurosurgeon, for second opinion, see Plan.   PAOD: Her ABI's have remained stable and normal on the right, stable moderate disease in the left, with improved quality of waveforms in the left.   Chronic venous insufficiency: is not wearing knee high compression hose that are too tight. Will refer to [facility] for measuring for 15-20 or 20-30 mm Hg knee high compression hose, and donning device that she can use.   Smokeless tobacco use since preteen: counseled re this. This is her primary atherosclerotic risk factor. She does not have DM (which is a potent atherosclerotic risk factor) but seems to have  prediabetes. Her advanced age is also an atherosclerotic risk factor.    DATA  ABI (Date: 06/02/2018): ABI Findings: +---------+------------------+-----+--------+--------+ Right    Rt Pressure (mmHg)IndexWaveformComment  +---------+------------------+-----+--------+--------+ Brachial 163                                     +---------+------------------+-----+--------+--------+ PTA      204               1.21 biphasic (was bi)         +---------+------------------+-----+--------+--------+ DP       186               1.10 biphasic (was tri)         +---------+------------------+-----+--------+--------+ Galvin Proffer               0.73 Normal           +---------+------------------+-----+--------+--------+  +---------+------------------+-----+--------+-------+ Left     Lt Pressure (mmHg)IndexWaveformComment +---------+------------------+-----+--------+-------+ Brachial 169                                    +---------+------------------+-----+--------+-------+ PTA      97                0.57 biphasic (was mono)        +---------+------------------+-----+--------+-------+ DP       100  0.59 biphasic (was mono)        +---------+------------------+-----+--------+-------+ Great Toe61                0.36 Abnormal        +---------+------------------+-----+--------+-------+  +-------+-----------+-----------+------------+------------+ ABI/TBIToday's ABIToday's TBIPrevious ABIPrevious TBI +-------+-----------+-----------+------------+------------+ Right  1.21       0.73       1.07        0.75         +-------+-----------+-----------+------------+------------+ Left   0.59       0.36       0.57        0.40         +-------+-----------+-----------+------------+------------+   Summary: Right: Resting right ankle-brachial index is within normal range. No evidence of significant right lower extremity  arterial disease. Waveforms are stable at biphasic. The right toe-brachial index is normal. Left: Resting left ankle-brachial index indicates moderate left lower extremity arterial disease. Waveforms improved to biphasic from monophasic.  The left toe-brachial index is abnormal.    DVT venous duplex of left leg (06-02-18): No DVT visualized in left LE.     PLAN:  Based on the patient's vascular studies and examination, pt will return to clinic in 6 months with ABI's.    Referral to Dr. Erline Levine, neurosurgery, for second opinion re her lumbar spine abnormalities, referred pain in her left ankle with resistance to left left leg raise, and feasibility of ESI's if she does have lumbar spine anomaly that is contributing to her left leg pain.   I discussed in depth with the patient the nature of atherosclerosis, and emphasized the importance of maximal medical management including strict control of blood pressure, blood glucose, and lipid levels, obtaining regular exercise, and cessation of smokeless tobacco use.  The patient is aware that without maximal medical management the underlying atherosclerotic disease process will progress, limiting the benefit of any interventions.  The patient was given information about PAD including signs, symptoms, treatment, what symptoms should prompt the patient to seek immediate medical care, and risk reduction measures to take.  Clemon Chambers, RN, MSN, FNP-C [facility] Phone: [phone]  Clinic MD: Oneida Alar  06/02/18 3:48 PM

## 2018-06-03 ENCOUNTER — Encounter: Payer: Self-pay | Admitting: Adult Health

## 2018-06-03 ENCOUNTER — Ambulatory Visit (INDEPENDENT_AMBULATORY_CARE_PROVIDER_SITE_OTHER): Payer: 59 | Admitting: Adult Health

## 2018-06-03 DIAGNOSIS — I272 Pulmonary hypertension, unspecified: Secondary | ICD-10-CM

## 2018-06-03 DIAGNOSIS — M5442 Lumbago with sciatica, left side: Secondary | ICD-10-CM

## 2018-06-03 DIAGNOSIS — G8929 Other chronic pain: Secondary | ICD-10-CM | POA: Diagnosis not present

## 2018-06-03 MED ORDER — FUROSEMIDE 20 MG PO TABS: 20.0000 mg | ORAL_TABLET | Freq: Every day | ORAL | 1 refills | Status: DC

## 2018-06-03 MED ORDER — PREDNISONE 20 MG PO TABS: 20.0000 mg | ORAL_TABLET | Freq: Every day | ORAL | 0 refills | Status: AC

## 2018-06-03 NOTE — Progress Notes (Signed)
Virtual Visit via Telephone Note  I connected with Tammy [name] on 06/03/18 at  3:30 PM EDT by telephone and verified that I am speaking with the correct person using two identifiers.   I discussed the limitations, risks, security and privacy concerns of performing an evaluation and management service by telephone and the availability of in person appointments. I also discussed with the patient that there may be a patient responsible charge related to this service. The patient expressed understanding and agreed to proceed.  Location patient: home Location provider: work or home office Participants present for the call: patient, provider Patient did not have a visit in the prior 7 days to address this/these issue(s).   History of Present Illness: 85 year old female who  has a past medical history of BREAST BIOPSY, HX OF (06/10/2006), Breast cyst, HIP PAIN, LEFT (01/31/2007), HYPERLIPIDEMIA (06/10/2006), HYPERTENSION (06/10/2006), Impaired glucose tolerance, NEOPLASMS UNSPEC NATURE BONE SOFT TISSUE&SKIN (05/31/2008), OSTEOPOROSIS (06/10/2006), and Pulmonary embolus ([place]).  She is being evaluated today for continued low back pain with left-sided sciatica.  She was last seen for this in October 2019 and has since been seen by orthopedics.  She reports that she was told by orthopedics that she had a slipped disc in her lower back and there was not a whole lot that could be done.  Has had some intermittent pain over the last few months but recently the pain is become worse.  She is wondering if she can be put in for physical therapy to help with strengthening exercises.  Tober of 2019 she was also treated with prednisone and responded well to this, she is wondering if she can get another prescription to help with the pain.  Currently she is taking Tylenol that helps somewhat but does not relieve the pain.   Pain is worse with ambulation and changes in position   Additionally, she needs her Lasix refilled.     Observations/Objective: Patient sounds cheerful and well on the phone. I do not appreciate any SOB. Speech and thought processing are grossly intact. Patient reported vitals:  Assessment and Plan: 1. Chronic bilateral low back pain with left-sided sciatica  - predniSONE (DELTASONE) 20 MG tablet; Take 1 tablet (20 mg total) by mouth daily with breakfast for 10 days.  Dispense: 10 tablet; Refill: 0 - Ambulatory referral to Physical Therapy - Follow up if not improving in the next 2-3 days   2. Pulmonary hypertension (HCC)  - furosemide (LASIX) 20 MG tablet; Take 1 tablet (20 mg total) by mouth daily.  Dispense: 90 tablet; Refill: 1   Follow Up Instructions:  I did not refer this patient for an OV in the next 24 hours for this/these issue(s).  I discussed the assessment and treatment plan with the patient. The patient was provided an opportunity to ask questions and all were answered. The patient agreed with the plan and demonstrated an understanding of the instructions.   The patient was advised to call back or seek an in-person evaluation if the symptoms worsen or if the condition fails to improve as anticipated.  I provided 25 minutes of non-face-to-face time during this encounter.   [name], NP

## 2018-06-07 ENCOUNTER — Ambulatory Visit: Payer: Medicare HMO | Attending: Adult Health | Admitting: Physical Therapy

## 2018-06-07 ENCOUNTER — Encounter: Payer: Self-pay | Admitting: Physical Therapy

## 2018-06-07 ENCOUNTER — Other Ambulatory Visit: Payer: Self-pay

## 2018-06-07 DIAGNOSIS — M5442 Lumbago with sciatica, left side: Secondary | ICD-10-CM | POA: Insufficient documentation

## 2018-06-07 DIAGNOSIS — R262 Difficulty in walking, not elsewhere classified: Secondary | ICD-10-CM | POA: Insufficient documentation

## 2018-06-07 DIAGNOSIS — M6281 Muscle weakness (generalized): Secondary | ICD-10-CM | POA: Insufficient documentation

## 2018-06-07 DIAGNOSIS — G8929 Other chronic pain: Secondary | ICD-10-CM | POA: Diagnosis not present

## 2018-06-07 NOTE — Patient Instructions (Signed)
   TENS UNIT  This is helpful for muscle pain and spasm.   Search and Purchase a TENS 7000 2nd edition at www.tenspros.com or www.amazon.com  (It should be less than $30)     TENS unit instructions:   Do not shower or bathe with the unit on  Turn the unit off before removing electrodes or batteries  If the electrodes lose stickiness add a drop of water to the electrodes after they are disconnected from the unit and place on plastic sheet. If you continued to have difficulty, call the TENS unit company to purchase more electrodes.  Do not apply lotion on the skin area prior to use. Make sure the skin is clean and dry as this will help prolong the life of the electrodes.  After use, always check skin for unusual red areas, rash or other skin difficulties. If there are any skin problems, does not apply electrodes to the same area.  Never remove the electrodes from the unit by pulling the wires.  Do not use the TENS unit or electrodes other than as directed.  Do not change electrode placement without consulting your therapist or physician.  Keep 2 fingers with between each electrode.   Alberta Cairns PT [facility] [address], [address] [place], [place] [address] Phone # [phone] Fax [phone] 

## 2018-06-07 NOTE — Therapy (Addendum)
[facility] [address], [name], [place], [address] Phone: [phone]   Fax:  [phone]  Physical Therapy Evaluation  Patient Details  Name: Tammy [name] MRN: [id number] Date of Birth: [birth year] Referring Provider (PT): [name] NP   Encounter Date: 06/07/2018  PT End of Session - 06/07/18 1203    Visit Number  1    Date for PT Re-Evaluation  08/02/18    Authorization Type  Medicare [employer]    PT Start Time  1100    PT Stop Time  1146    PT Time Calculation (min)  46 min    Activity Tolerance  Patient tolerated treatment well       Past Medical History:  Diagnosis Date  . BREAST BIOPSY, HX OF 06/10/2006  . Breast cyst   . HIP PAIN, LEFT 01/31/2007  . HYPERLIPIDEMIA 06/10/2006  . HYPERTENSION 06/10/2006  . Impaired glucose tolerance   . NEOPLASMS UNSPEC NATURE BONE SOFT TISSUE&SKIN 05/31/2008  . OSTEOPOROSIS 06/10/2006  . Pulmonary embolus [facility])     Past Surgical History:  Procedure Laterality Date  . ABDOMINAL HYSTERECTOMY    . APPENDECTOMY    . BREAST SURGERY     bx x2  . CARDIOVERSION N/A 03/11/2017   Procedure: CARDIOVERSION;  Surgeon: Sanda Klein, MD;  Location: [facility];  Service: Cardiovascular;  Laterality: N/A;  . CHOLECYSTECTOMY  1980    There were no vitals filed for this visit.   Subjective Assessment - 06/07/18 1059    Subjective  This started from a fall in August when I fell on my butt.  Then a 2nd one too.  I'm hurting today.  I'm getting 2 different answers.  Dr. Tommi Rumps said OA and bruise in hip.  Went to Network engineer and he said slipped vertebrae.  Then went to vein doctor who said "pinched nerve in back."  My ankle hurts a lot and is swollen.  Across low back intermittent but left posterior hip, ankle hurts constantly.  I sleep good at night on right side.      Pertinent History  uses Quad cane full time    Limitations  Walking;House hold activities    How long can you sit  comfortably?  my leg still hurts but not as bad.  Max time 15 minutes.     How long can you walk comfortably?  max with QC household only bedroom to kitchen     Diagnostic tests  x-rays at ortho    Patient Stated Goals  no pain;  I'd like to go shopping, my husband does it now b/c I can't walk those aisles    Currently in Pain?  Yes    Pain Score  4     Pain Location  Back    Pain Orientation  Left    Pain Type  Chronic pain    Aggravating Factors   left sidelying, walking     Pain Relieving Factors  right sidelying; sit leaning forward          [facility] PT Assessment - 06/07/18 0001      Assessment   Medical Diagnosis  left LBP sciatica    Referring Provider (PT)  [name] NP    Onset Date/Surgical Date  --   August   Next MD Visit  as needed    Prior Therapy  went to chiro before pandemic I didn't like it.  He rubbed my legs and back  Precautions   Precautions  Fall    Precaution Comments  osteoporosis      Balance Screen   Has the patient fallen in the past 6 months  Yes    How many times?  2    Has the patient had a decrease in activity level because of a fear of falling?   Yes    Is the patient reluctant to leave their home because of a fear of falling?   No      Home Environment   Living Environment  Private residence    Living Arrangements  Spouse/significant other    Type of [place] to enter    Entrance Stairs-Number of Steps  3    Entrance Stairs-Rails  Right;Left;Can reach both    Gravity  One level    [place] - 2 wheels;Cane - quad      Prior Function   Level of Independence  Independent with household mobility with device    Vocation  Retired    Leisure  read       Observation/Other Assessments   Skin Integrity  moderate bil lower leg edema and redness     Focus on Therapeutic Outcomes (FOTO)   60% limitation       Posture/Postural Control   Postural Limitations  Decreased lumbar lordosis;Increased  thoracic kyphosis      AROM   Lumbar Flexion  --   not tested secondary to osteoporosis   Lumbar Extension  0    Lumbar - Right Side Bend  10    Lumbar - Left Side Bend  10      Strength   Overall Strength Comments  Unable to rise from a chair without UE assist    Right Hip Flexion  4-/5    Right Hip ABduction  4-/5    Left Hip Flexion  2+/5    Left Hip ABduction  3+/5    Right Knee Flexion  4-/5    Right Knee Extension  4-/5    Left Knee Flexion  3+/5    Left Knee Extension  3+/5    Right Ankle Dorsiflexion  4-/5    Right Ankle Eversion  4-/5    Left Ankle Dorsiflexion  3+/5    Left Ankle Eversion  3+/5    Lumbar Flexion  3+/5    Lumbar Extension  3+/5      Palpation   Palpation comment  tenderness lumbar spinous processes especially L3-4; tender points left gluteals and piriformis      Slump test   Findings  Positive    Side  Left    Comment  knee extension without ankle DF      Straight Leg Raise   Findings  Positive    Side   Left    Comment  35 degrees      Ambulation/Gait   Gait Comments  slow gait with QC      Standardized Balance Assessment   Five times sit to stand comments   needs UE assist 38 sec      Timed Up and Go Test   Normal TUG (seconds)  24.56    TUG Comments  with QC                Objective measurements completed on examination: See above findings.              PT Education - 06/07/18 1202  Education Details  home TENS info     Person(s) Educated  Patient    Methods  Handout    Comprehension  Verbalized understanding       PT Short Term Goals - 06/07/18 1220      PT SHORT TERM GOAL #1   Title  The patient  will demonstrate compliance with an initial HEP (patient has been sedentary for 9 months)    Time  4    Period  Weeks    Status  New    Target Date  07/05/18      PT SHORT TERM GOAL #2   Title  The patient will be able to stand 3-5 minutes with pain level 5/10 for meal prep    Time  4    Period   Weeks    Status  New      PT SHORT TERM GOAL #3   Title  Timed up and go improved to 24 sec indicating increased strength, mobility and balance    Time  4    Period  Weeks    Status  New        PT Long Term Goals - 06/07/18 1223      PT LONG TERM GOAL #1   Title  The patient will be independent with safe self progression of HEP     Time  8    Period  Weeks    Status  New    Target Date  08/02/18      PT LONG TERM GOAL #2   Title  The patient will be able to ambulate 80 feet with pain level 5/10    Time  8    Period  Weeks    Status  New      PT LONG TERM GOAL #3   Title  The patient will have improved trunk and LE strength to grossly 4-/5 needed for greater ease with sit to stand and to lift her leg into the shower tub.      Time  8    Period  Weeks    Status  New      PT LONG TERM GOAL #4   Title  Timed up and Go test improved to 21 sec indicating improved stength, mobility and balance    Time  8    Period  Weeks    Status  New      PT LONG TERM GOAL #5   Title  FOTO functional outcome score improved from 60% limitation to 49% indicating improved function with less pain    Time  8    Period  Weeks    Status  New             Plan - 06/07/18 1204    Clinical Impression Statement  The patient reports the onset of bil low back pain, left posterior hip pain and pain extending down her left lateral leg to her ankle after a fall at home in August.  She had a 2nd fall which further exacerbated symptoms.  Her pain is worsened with standing to cook, walking for any length of time and with left sidelying.  She is unable to finish cooking a meal or go to the grocery store anymore b/c of worsening pain.  She presents today with a quad cane which she reports she uses on a full time basis.  Point tenderness over lumbar spinous processes and posterior hip muscles.  Postural changes consistent with her age.  Limited lumbar ROM in all planes.  Decreased LE strength on Left  grossly 3+/5 except hip flexion on left 2+/5 and needs UE assist to lift at times.  She is unable to rise from a standard chair without signficant UE use.  5x sit to stand test 38 seconds.  Decreased Timed up and Go score of nearly 25 sec which is indicative of higher risk of falls.  She would benefit from PT 2x/week to address these numerous deficits however the patient is concerned about her co-pay and requests a frequency of 1x/week.      Personal Factors and Comorbidities  Age;Fitness;Time since onset of injury/illness/exacerbation;Finances;Comorbidity 1;Comorbidity 2;Comorbidity 3+    Comorbidities  HTN, osteoporosis; cardiac history; high cholesterol; peripheral artery disease     Examination-Activity Limitations  Bathing;Hygiene/Grooming;Bed Mobility;Stairs;Stand;Reach Overhead;Carry;Sit;Locomotion Level    Examination-Participation Restrictions  Shop;Cleaning;Meal Prep    Stability/Clinical Decision Making  Evolving/Moderate complexity    Clinical Decision Making  Moderate    Rehab Potential  Good    PT Frequency  1x / week   per patient request finances   PT Duration  8 weeks    PT Treatment/Interventions  ADLs/Self Care Home Management;Electrical Stimulation;Ultrasound;Moist Heat;Therapeutic activities;Patient/family education;Manual techniques;Dry needling;Neuromuscular re-education;Taping;Therapeutic exercise    PT Next Visit Plan  pain management;  try IFC/heat during or following ex;  patient given info and will consider DN to left gluteals; initiate HEP appropriate for osteoporosis    Recommended Other Services  home TENs info given     Consulted and Agree with Plan of Care  Patient       Patient will benefit from skilled therapeutic intervention in order to improve the following deficits and impairments:  Pain, Increased fascial restricitons, Decreased mobility, Postural dysfunction, Decreased activity tolerance, Decreased endurance, Decreased range of motion, Decreased strength,  Impaired perceived functional ability, Decreased balance, Difficulty walking  Visit Diagnosis: Chronic bilateral low back pain with left-sided sciatica - Plan: PT plan of care cert/re-cert  Muscle weakness (generalized) - Plan: PT plan of care cert/re-cert  Difficulty in walking, not elsewhere classified - Plan: PT plan of care cert/re-cert   The patient has been informed of current processes in place at Outpatient Rehab to protect patients from Covid-19 exposure including social distancing, schedule modifications, and new cleaning procedures. After discussing their particular risk with a therapist based on the patient's personal risk factors, the patient has decided to proceed with in-person therapy.  Problem List Patient Active Problem List   Diagnosis Date Noted  . Atrial fibrillation ([place]) 03/21/2014  . Embolism, pulmonary with infarction ([place]) 03/08/2014  . Severe tricuspid regurgitation by prior echocardiogram 02/23/2014  . Pulmonary hypertension ([place]) 02/23/2014  . Syncope and collapse 02/23/2014  . Pain in limb 06/07/2013  . PAD (peripheral artery disease) ([place]) 06/01/2013  . Syncope 04/02/2012  . NEOPLASMS UNSPEC NATURE BONE SOFT TISSUE&SKIN 05/31/2008  . Dyslipidemia 06/10/2006  . Essential hypertension 06/10/2006  . Osteoporosis 06/10/2006  . BREAST BIOPSY, HX OF 06/10/2006   Ruben Im, PT 06/07/18 12:29 PM Phone: [phone] Fax: [phone]  Alvera Singh 06/07/2018, 12:29 PM  [facility] [address], [place], [place], [address] Phone: [phone]   Fax:  [phone]  Name: EVE REY MRN: [id number] Date of Birth: 1931/09/10

## 2018-06-16 ENCOUNTER — Encounter: Payer: Self-pay | Admitting: Physical Therapy

## 2018-06-16 ENCOUNTER — Ambulatory Visit: Payer: Medicare HMO | Admitting: Physical Therapy

## 2018-06-16 ENCOUNTER — Other Ambulatory Visit: Payer: Self-pay

## 2018-06-16 DIAGNOSIS — M6281 Muscle weakness (generalized): Secondary | ICD-10-CM | POA: Diagnosis not present

## 2018-06-16 DIAGNOSIS — M5442 Lumbago with sciatica, left side: Secondary | ICD-10-CM | POA: Diagnosis not present

## 2018-06-16 DIAGNOSIS — G8929 Other chronic pain: Secondary | ICD-10-CM

## 2018-06-16 DIAGNOSIS — R262 Difficulty in walking, not elsewhere classified: Secondary | ICD-10-CM

## 2018-06-16 NOTE — Therapy (Signed)
[facility] [address], [place], [place], [address] Phone: [phone]   Fax:  [phone]  Physical Therapy Treatment  Patient Details  Name: Tammy [name] MRN: [id number] Date of Birth: 10-24-1931 Referring Provider (PT): [name] NP   Encounter Date: 06/16/2018  PT End of Session - 06/16/18 1005    Visit Number  2    Date for PT Re-Evaluation  08/02/18    Authorization Type  Medicare [employer]    PT Start Time  0915    PT Stop Time  1000    PT Time Calculation (min)  45 min    Activity Tolerance  Patient tolerated treatment well    Behavior During Therapy  [facility] for tasks assessed/performed       Past Medical History:  Diagnosis Date  . BREAST BIOPSY, HX OF 06/10/2006  . Breast cyst   . HIP PAIN, LEFT 01/31/2007  . HYPERLIPIDEMIA 06/10/2006  . HYPERTENSION 06/10/2006  . Impaired glucose tolerance   . NEOPLASMS UNSPEC NATURE BONE SOFT TISSUE&SKIN 05/31/2008  . OSTEOPOROSIS 06/10/2006  . Pulmonary embolus [facility])     Past Surgical History:  Procedure Laterality Date  . ABDOMINAL HYSTERECTOMY    . APPENDECTOMY    . BREAST SURGERY     bx x2  . CARDIOVERSION N/A 03/11/2017   Procedure: CARDIOVERSION;  Surgeon: Sanda Klein, MD;  Location: [facility];  Service: Cardiovascular;  Laterality: N/A;  . CHOLECYSTECTOMY  1980    There were no vitals filed for this visit.  Subjective Assessment - 06/16/18 0920    Subjective  Pt states that things are going well. She is not sure how to use the TENS unit.     Pertinent History  uses Quad cane full time    Limitations  Walking;House hold activities    How long can you sit comfortably?  my leg still hurts but not as bad.  Max time 15 minutes.     How long can you walk comfortably?  max with QC household only bedroom to kitchen     Diagnostic tests  x-rays at ortho    Patient Stated Goals  no pain;  I'd like to go shopping, my husband does it now b/c I can't walk those  aisles    Currently in Pain?  Yes    Pain Score  5     Pain Location  Hip    Pain Orientation  Left;Posterior    Pain Descriptors / Indicators  Aching;Throbbing    Pain Type  Chronic pain    Pain Radiating Towards  down the outside the hip and knee to the outside of the ankle     Pain Onset  More than a month ago    Pain Frequency  Constant    Aggravating Factors   left sidelying, walking     Pain Relieving Factors  right sidelying, sit leaning forward     Effect of Pain on Daily Activities  limited self care                        [facility] Adult PT Treatment/Exercise - 06/16/18 0001      Exercises   Exercises  Lumbar;Knee/Hip      Knee/Hip Exercises: Stretches   Active Hamstring Stretch  Left    Active Hamstring Stretch Limitations  x10 reps 90/90 5 sec hold     Other Knee/Hip Stretches  Lt SKTC 10x5 sec hold  Other Knee/Hip Stretches  B knees to chest with red physioball and therapist providing end range stretch x15 reps; 90/90 low trunk rotation to the Lt with LE on red physioball x15 reps       Knee/Hip Exercises: Seated   Ball Squeeze  x15 reps     Other Seated Knee/Hip Exercises  BLE clams without resistance x10 reps       Manual Therapy   Manual therapy comments  attempted posterior iliac mobilization during ambulation but unclear if this made any change in pain; STM Lt gluteals             PT Education - 06/16/18 1005    Education Details  implemented and reviewed HEP; technique with therex    Person(s) Educated  Patient    Methods  Explanation;Handout;Verbal cues    Comprehension  Verbalized understanding;Returned demonstration       PT Short Term Goals - 06/07/18 1220      PT SHORT TERM GOAL #1   Title  The patient  will demonstrate compliance with an initial HEP (patient has been sedentary for 9 months)    Time  4    Period  Weeks    Status  New    Target Date  07/05/18      PT SHORT TERM GOAL #2   Title  The patient will be able  to stand 3-5 minutes with pain level 5/10 for meal prep    Time  4    Period  Weeks    Status  New      PT SHORT TERM GOAL #3   Title  Timed up and go improved to 24 sec indicating increased strength, mobility and balance    Time  4    Period  Weeks    Status  New        PT Long Term Goals - 06/07/18 1223      PT LONG TERM GOAL #1   Title  The patient will be independent with safe self progression of HEP     Time  8    Period  Weeks    Status  New    Target Date  08/02/18      PT LONG TERM GOAL #2   Title  The patient will be able to ambulate 80 feet with pain level 5/10    Time  8    Period  Weeks    Status  New      PT LONG TERM GOAL #3   Title  The patient will have improved trunk and LE strength to grossly 4-/5 needed for greater ease with sit to stand and to lift her leg into the shower tub.      Time  8    Period  Weeks    Status  New      PT LONG TERM GOAL #4   Title  Timed up and Go test improved to 21 sec indicating improved stength, mobility and balance    Time  8    Period  Weeks    Status  New      PT LONG TERM GOAL #5   Title  FOTO functional outcome score improved from 60% limitation to 49% indicating improved function with less pain    Time  8    Period  Weeks    Status  New            Plan - 06/16/18 1005    Clinical Impression  Statement  Pt arrived with continued Lt buttock and ankle pain, which is expected due to this being her first treatment since the evaluation. Focused on therex to promote hip/low back mobility. Pt did require some modifications to her exercises for improved comfort. Therapist completed soft tissue mobilization to the Lt gluteals and implemented her HEP. Pt was able to demonstrate understanding of proper exercise technique. Although iliac mobilization appeared to not decrease hip pain with ambulation, pt reported no increase in her pain following today's session. Will continue to promote pain free movement with gradual  progression of LE/trunk strength.      Personal Factors and Comorbidities  Age;Fitness;Time since onset of injury/illness/exacerbation;Finances;Comorbidity 1;Comorbidity 2;Comorbidity 3+    Comorbidities  HTN, osteoporosis; cardiac history; high cholesterol; peripheral artery disease     Examination-Activity Limitations  Bathing;Hygiene/Grooming;Bed Mobility;Stairs;Stand;Reach Overhead;Carry;Sit;Locomotion Level    Examination-Participation Restrictions  Shop;Cleaning;Meal Prep    Stability/Clinical Decision Making  Evolving/Moderate complexity    Rehab Potential  Good    PT Frequency  1x / week   per patient request finances   PT Duration  8 weeks    PT Treatment/Interventions  ADLs/Self Care Home Management;Electrical Stimulation;Ultrasound;Moist Heat;Therapeutic activities;Patient/family education;Manual techniques;Dry needling;Neuromuscular re-education;Taping;Therapeutic exercise    PT Next Visit Plan  pain management with gentle ROM; educate pt on setup of tens; patient given info and will consider DN to left gluteals    PT Home Exercise Plan  Access Code: [email or web address]     Consulted and Agree with Plan of Care  Patient       Patient will benefit from skilled therapeutic intervention in order to improve the following deficits and impairments:  Pain, Increased fascial restricitons, Decreased mobility, Postural dysfunction, Decreased activity tolerance, Decreased endurance, Decreased range of motion, Decreased strength, Impaired perceived functional ability, Decreased balance, Difficulty walking  Visit Diagnosis: Chronic bilateral low back pain with left-sided sciatica  Muscle weakness (generalized)  Difficulty in walking, not elsewhere classified     Problem List Patient Active Problem List   Diagnosis Date Noted  . Atrial fibrillation ([place]) 03/21/2014  . Embolism, pulmonary with infarction ([place]) 03/08/2014  . Severe tricuspid regurgitation by prior echocardiogram 02/23/2014  .  Pulmonary hypertension ([place]) 02/23/2014  . Syncope and collapse 02/23/2014  . Pain in limb 06/07/2013  . PAD (peripheral artery disease) ([place]) 06/01/2013  . Syncope 04/02/2012  . NEOPLASMS UNSPEC NATURE BONE SOFT TISSUE&SKIN 05/31/2008  . Dyslipidemia 06/10/2006  . Essential hypertension 06/10/2006  . Osteoporosis 06/10/2006  . BREAST BIOPSY, HX OF 06/10/2006    10:12 AM,06/16/18 Sherol Dade PT, DPT [place] [facility] [place]  [facility] [address], [place], [place], [address] Phone: [phone]   Fax:  [phone]  Name: Tammy [name] MRN: [id number] Date of Birth: 1931-05-07

## 2018-06-16 NOTE — Patient Instructions (Signed)
Access Code: [email or web address]  URL: [email or web address]  Date: 06/16/2018  Prepared by: Sherol Dade   Exercises  Single Knee to Chest Stretch - 10 reps - 5 hold - 2x daily - 7x weekly  Supine Lower Trunk Rotation - 10 reps - 3x daily - 7x weekly  Seated Hip Adduction Isometrics with Ball - 15 reps - 1 sets - 3x daily - 7x weekly  Seated Hip Abduction - 15 reps - 1 sets - 1x daily - 7x weekly    [facility] [address], [place],  [address] Phone # [phone] Fax [phone]

## 2018-06-17 ENCOUNTER — Ambulatory Visit: Payer: 59 | Admitting: Cardiology

## 2018-06-23 ENCOUNTER — Encounter: Payer: Self-pay | Admitting: Physical Therapy

## 2018-06-23 ENCOUNTER — Ambulatory Visit: Payer: Medicare HMO | Admitting: Physical Therapy

## 2018-06-23 ENCOUNTER — Other Ambulatory Visit: Payer: Self-pay

## 2018-06-23 DIAGNOSIS — G8929 Other chronic pain: Secondary | ICD-10-CM | POA: Diagnosis not present

## 2018-06-23 DIAGNOSIS — M6281 Muscle weakness (generalized): Secondary | ICD-10-CM

## 2018-06-23 DIAGNOSIS — R262 Difficulty in walking, not elsewhere classified: Secondary | ICD-10-CM

## 2018-06-23 DIAGNOSIS — M5442 Lumbago with sciatica, left side: Secondary | ICD-10-CM | POA: Diagnosis not present

## 2018-06-23 NOTE — Patient Instructions (Signed)
Access Code: [email or web address]  URL: [email or web address]  Date: 06/23/2018  Prepared by: Sherol Dade   Exercises  Supine Lower Trunk Rotation - 10 reps - 3x daily - 7x weekly  Seated Hip Adduction Isometrics with Ball - 15 reps - 1 sets - 3x daily - 7x weekly  Seated Piriformis Stretch - 1 sets - 30 hold - 3x daily - 7x weekly  Seated Hip Abduction - 15 reps - 1 sets - 1x daily - 7x weekly  Clamshell - 10 reps - 3 sets - 1x daily - 7x weekly    [facility] [address], [place], [place] [address] Phone # [phone] Fax [phone]

## 2018-06-23 NOTE — Therapy (Signed)
[facility] [address] [place], [place], [address] Phone: [phone]   Fax:  [phone]  Physical Therapy Treatment  Patient Details  Name: Tammy [name] MRN: [id number] Date of Birth: 04-Oct-1931 Referring Provider (PT): [name] NP   Encounter Date: 06/23/2018  PT End of Session - 06/23/18 0948    Visit Number  3    Date for PT Re-Evaluation  08/02/18    Authorization Type  Medicare [employer]    PT Start Time  0901    PT Stop Time  0948    PT Time Calculation (min)  47 min    Activity Tolerance  Patient tolerated treatment well;No increased pain    Behavior During Therapy  [facility] for tasks assessed/performed       Past Medical History:  Diagnosis Date  . BREAST BIOPSY, HX OF 06/10/2006  . Breast cyst   . HIP PAIN, LEFT 01/31/2007  . HYPERLIPIDEMIA 06/10/2006  . HYPERTENSION 06/10/2006  . Impaired glucose tolerance   . NEOPLASMS UNSPEC NATURE BONE SOFT TISSUE&SKIN 05/31/2008  . OSTEOPOROSIS 06/10/2006  . Pulmonary embolus [facility])     Past Surgical History:  Procedure Laterality Date  . ABDOMINAL HYSTERECTOMY    . APPENDECTOMY    . BREAST SURGERY     bx x2  . CARDIOVERSION N/A 03/11/2017   Procedure: CARDIOVERSION;  Surgeon: Sanda Klein, MD;  Location: [facility];  Service: Cardiovascular;  Laterality: N/A;  . CHOLECYSTECTOMY  1980    There were no vitals filed for this visit.  Subjective Assessment - 06/23/18 1113    Subjective  Pt states her buttock region and Lt ankle still bother her. She brought her TENS to learn how to use.     Pertinent History  uses Quad cane full time    Limitations  Walking;House hold activities    How long can you sit comfortably?  my leg still hurts but not as bad.  Max time 15 minutes.     How long can you walk comfortably?  max with QC household only bedroom to kitchen     Diagnostic tests  x-rays at ortho    Patient Stated Goals  no pain;  I'd like to go shopping, my  husband does it now b/c I can't walk those aisles    Currently in Pain?  Yes    Pain Score  5     Pain Location  Hip   Lt lateral ankle    Pain Orientation  Left    Pain Descriptors / Indicators  Aching;Throbbing    Pain Type  Chronic pain    Pain Onset  More than a month ago    Pain Frequency  Constant    Aggravating Factors   Lt sidelying, walking (ankle always there)    Pain Relieving Factors  Rt sidelying                        [facility] Adult PT Treatment/Exercise - 06/23/18 0001      Self-Care   Self-Care  Other Self-Care Comments    Other Self-Care Comments   TENS placement demonstration; discussed parameters for safe use with pt and her husband      Lumbar Exercises: Stretches   Piriformis Stretch  1 rep;30 seconds;Left    Piriformis Stretch Limitations  seated     Other Lumbar Stretch Exercise  LLE sciatic nerve floss with heavy tactile cues x10 reps  Knee/Hip Exercises: Sidelying   Clams  Lt hip x10 reps during manual treatment       Manual Therapy   Manual therapy comments  trigger point release Lt piriformis; Low lumbar Rt rotation mobilization              PT Education - 06/23/18 1023    Education Details  set up and use of TENS; updated and reviewed HEP    Person(s) Educated  Patient    Methods  Explanation;Demonstration;Handout    Comprehension  Returned demonstration;Verbalized understanding       PT Short Term Goals - 06/07/18 1220      PT SHORT TERM GOAL #1   Title  The patient  will demonstrate compliance with an initial HEP (patient has been sedentary for 9 months)    Time  4    Period  Weeks    Status  New    Target Date  07/05/18      PT SHORT TERM GOAL #2   Title  The patient will be able to stand 3-5 minutes with pain level 5/10 for meal prep    Time  4    Period  Weeks    Status  New      PT SHORT TERM GOAL #3   Title  Timed up and go improved to 24 sec indicating increased strength, mobility and balance    Time   4    Period  Weeks    Status  New        PT Long Term Goals - 06/07/18 1223      PT LONG TERM GOAL #1   Title  The patient will be independent with safe self progression of HEP     Time  8    Period  Weeks    Status  New    Target Date  08/02/18      PT LONG TERM GOAL #2   Title  The patient will be able to ambulate 80 feet with pain level 5/10    Time  8    Period  Weeks    Status  New      PT LONG TERM GOAL #3   Title  The patient will have improved trunk and LE strength to grossly 4-/5 needed for greater ease with sit to stand and to lift her leg into the shower tub.      Time  8    Period  Weeks    Status  New      PT LONG TERM GOAL #4   Title  Timed up and Go test improved to 21 sec indicating improved stength, mobility and balance    Time  8    Period  Weeks    Status  New      PT LONG TERM GOAL #5   Title  FOTO functional outcome score improved from 60% limitation to 49% indicating improved function with less pain    Time  8    Period  Weeks    Status  New            Plan - 06/23/18 1779    Clinical Impression Statement  Pt arrived with Lt buttock pain and Lt ankle pain relatively unchanged from her last session. Her husband accompanied her to ensure there was full understanding of TENS set up and use. Therapist was able to educate the pt and her husband on parameters and placement of the TENS for pain control. He  was interactive and demonstrated good understanding by the end of the session. Therapist completed manual techniques to decrease soft tissue restrictions in the Lt hip as well as neural tension stretches. Following the session, pt reported resolved Lt buttock pain, however her Lt ankle pain remain unchanged. HEP was updated as well, to reflect changes in today's treatment.      Personal Factors and Comorbidities  Age;Fitness;Time since onset of injury/illness/exacerbation;Finances;Comorbidity 1;Comorbidity 2;Comorbidity 3+    Comorbidities  HTN,  osteoporosis; cardiac history; high cholesterol; peripheral artery disease     Examination-Activity Limitations  Bathing;Hygiene/Grooming;Bed Mobility;Stairs;Stand;Reach Overhead;Carry;Sit;Locomotion Level    Examination-Participation Restrictions  Shop;Cleaning;Meal Prep    Stability/Clinical Decision Making  Evolving/Moderate complexity    Rehab Potential  Good    PT Frequency  1x / week   per patient request finances   PT Duration  8 weeks    PT Treatment/Interventions  ADLs/Self Care Home Management;Electrical Stimulation;Ultrasound;Moist Heat;Therapeutic activities;Patient/family education;Manual techniques;Dry needling;Neuromuscular re-education;Taping;Therapeutic exercise    PT Next Visit Plan  pain management with gentle ROM; possible good response to rotation mobilization of low lumbar spine; patient given info and will consider DN to left gluteals    PT Home Exercise Plan  Access Code: [email or web address]     Consulted and Agree with Plan of Care  Patient       Patient will benefit from skilled therapeutic intervention in order to improve the following deficits and impairments:  Pain, Increased fascial restricitons, Decreased mobility, Postural dysfunction, Decreased activity tolerance, Decreased endurance, Decreased range of motion, Decreased strength, Impaired perceived functional ability, Decreased balance, Difficulty walking  Visit Diagnosis: Chronic bilateral low back pain with left-sided sciatica  Muscle weakness (generalized)  Difficulty in walking, not elsewhere classified     Problem List Patient Active Problem List   Diagnosis Date Noted  . Atrial fibrillation ([place]) 03/21/2014  . Embolism, pulmonary with infarction ([place]) 03/08/2014  . Severe tricuspid regurgitation by prior echocardiogram 02/23/2014  . Pulmonary hypertension ([place]) 02/23/2014  . Syncope and collapse 02/23/2014  . Pain in limb 06/07/2013  . PAD (peripheral artery disease) ([place]) 06/01/2013  . Syncope  04/02/2012  . NEOPLASMS UNSPEC NATURE BONE SOFT TISSUE&SKIN 05/31/2008  . Dyslipidemia 06/10/2006  . Essential hypertension 06/10/2006  . Osteoporosis 06/10/2006  . BREAST BIOPSY, HX OF 06/10/2006   \11:14 AM,06/23/18 Sherol Dade PT, DPT [place] [facility] [address], [place], [place], [address] Phone: [phone]   Fax:  [phone]  Name: Tammy [name] MRN: [id number] Date of Birth: 1931-10-21

## 2018-06-30 ENCOUNTER — Ambulatory Visit: Payer: Medicare HMO | Admitting: Family

## 2018-06-30 ENCOUNTER — Encounter (HOSPITAL_COMMUNITY): Payer: Medicare HMO

## 2018-06-30 ENCOUNTER — Other Ambulatory Visit: Payer: Self-pay

## 2018-06-30 ENCOUNTER — Encounter: Payer: Self-pay | Admitting: Physical Therapy

## 2018-06-30 ENCOUNTER — Ambulatory Visit: Payer: Medicare HMO | Attending: Adult Health | Admitting: Physical Therapy

## 2018-06-30 DIAGNOSIS — M6281 Muscle weakness (generalized): Secondary | ICD-10-CM | POA: Diagnosis not present

## 2018-06-30 DIAGNOSIS — M5442 Lumbago with sciatica, left side: Secondary | ICD-10-CM | POA: Insufficient documentation

## 2018-06-30 DIAGNOSIS — G8929 Other chronic pain: Secondary | ICD-10-CM | POA: Insufficient documentation

## 2018-06-30 DIAGNOSIS — R262 Difficulty in walking, not elsewhere classified: Secondary | ICD-10-CM | POA: Insufficient documentation

## 2018-06-30 NOTE — Therapy (Signed)
[facility] [address], [place], [place], [address] Phone: [phone]   Fax:  [phone]  Physical Therapy Treatment  Patient Details  Name: Tammy [name] MRN: [id number] Date of Birth: 04/24/1931 Referring Provider (PT): [name] NP   Encounter Date: 06/30/2018  PT End of Session - 06/30/18 0951    Visit Number  4    Date for PT Re-Evaluation  08/02/18    Authorization Type  Medicare [employer]    PT Start Time  0915    PT Stop Time  1000    PT Time Calculation (min)  45 min    Activity Tolerance  Patient tolerated treatment well       Past Medical History:  Diagnosis Date  . BREAST BIOPSY, HX OF 06/10/2006  . Breast cyst   . HIP PAIN, LEFT 01/31/2007  . HYPERLIPIDEMIA 06/10/2006  . HYPERTENSION 06/10/2006  . Impaired glucose tolerance   . NEOPLASMS UNSPEC NATURE BONE SOFT TISSUE&SKIN 05/31/2008  . OSTEOPOROSIS 06/10/2006  . Pulmonary embolus [facility])     Past Surgical History:  Procedure Laterality Date  . ABDOMINAL HYSTERECTOMY    . APPENDECTOMY    . BREAST SURGERY     bx x2  . CARDIOVERSION N/A 03/11/2017   Procedure: CARDIOVERSION;  Surgeon: Sanda Klein, MD;  [facility]: [facility];  Service: Cardiovascular;  Laterality: N/A;  . CHOLECYSTECTOMY  1980    There were no vitals filed for this visit.  Subjective Assessment - 06/30/18 0914    Subjective  I hurt all the time.  I feel fine while I'm here but then I'm sore the next day.  The TENs helps while it's on but then the pain comes back.  I haven't done the exercises b/c I hurt too much.    (Pended)     Patient Stated Goals  no pain;  I'd like to go shopping, my husband does it now b/c I can't walk those aisles  (Pended)     Currently in Pain?  Yes  (Pended)     Pain Score  5   (Pended)     Pain Location  Hip  (Pended)     Pain Orientation  Left  (Pended)     Pain Radiating Towards  lateral left ankle  (Pended)                         [facility] Adult PT Treatment/Exercise - 06/30/18 0001      Knee/Hip Exercises: Supine   Knee Extension Limitations  clam submax isometric 10x 5 sec holds    Other Supine Knee/Hip Exercises  bent knee ankle pumps 10x    Other Supine Knee/Hip Exercises  assisted knee flexion 10x       Knee/Hip Exercises: Sidelying   Clams  4   discontinued secondary to pain      Moist Heat Therapy   Number Minutes Moist Heat  5 Minutes    Moist Heat Location  Hip      Manual Therapy   Joint Mobilization  grade 2 left hip distraction and AP mobs 10 minutes    Soft tissue mobilization  left gluteals and piriformis    Muscle Energy Technique  contract relax gluteals and piriformis 5x 5 sec holds      Trigger Point Dry Needling - 06/30/18 0001    Consent Given?  Yes    Education Handout Provided  Yes    Muscles Treated Back/Hip  Gluteus minimus;Gluteus medius;Gluteus maximus;Piriformis    Dry Needling Comments  left sidelying     Gluteus Minimus Response  Twitch response elicited;Palpable increased muscle length    Gluteus Medius Response  Twitch response elicited;Palpable increased muscle length    Gluteus Maximus Response  Twitch response elicited;Palpable increased muscle length    Piriformis Response  Palpable increased muscle length           PT Education - 06/30/18 0951    Education Details  dry needling after care    Person(s) Educated  Patient    Methods  Explanation;Demonstration;Handout    Comprehension  Returned demonstration;Verbalized understanding       PT Short Term Goals - 06/30/18 1008      PT SHORT TERM GOAL #1   Title  The patient  will demonstrate compliance with an initial HEP (patient has been sedentary for 9 months)    Time  4    Period  Weeks    Status  On-going      PT SHORT TERM GOAL #2   Title  The patient will be able to stand 3-5 minutes with pain level 5/10 for meal prep    Time  4    Period  Weeks    Status  On-going       PT SHORT TERM GOAL #3   Title  Timed up and go improved to 24 sec indicating increased strength, mobility and balance    Time  4    Period  Weeks    Status  On-going        PT Long Term Goals - 06/07/18 1223      PT LONG TERM GOAL #1   Title  The patient will be independent with safe self progression of HEP     Time  8    Period  Weeks    Status  New    Target Date  08/02/18      PT LONG TERM GOAL #2   Title  The patient will be able to ambulate 80 feet with pain level 5/10    Time  8    Period  Weeks    Status  New      PT LONG TERM GOAL #3   Title  The patient will have improved trunk and LE strength to grossly 4-/5 needed for greater ease with sit to stand and to lift her leg into the shower tub.      Time  8    Period  Weeks    Status  New      PT LONG TERM GOAL #4   Title  Timed up and Go test improved to 21 sec indicating improved stength, mobility and balance    Time  8    Period  Weeks    Status  New      PT LONG TERM GOAL #5   Title  FOTO functional outcome score improved from 60% limitation to 49% indicating improved function with less pain    Time  8    Period  Weeks    Status  New            Plan - 06/30/18 3235    Clinical Impression Statement  The patient reports only short term improvement thus far.  Discussed incorporating dry needling in conjunction with exercise and manual therapy.  Initially she reports needle phobia but she is agreeable to try a limited amount.  Numerous tender points to even light palpation  of gluteals and piriformis muscles.  Good initial response to DN and low intensity manual therapy.  Therapist closely monitoring response with all interventions and modifying as needed for pain.      Personal Factors and Comorbidities  Age;Fitness;Time since onset of injury/illness/exacerbation;Finances;Comorbidity 1;Comorbidity 2;Comorbidity 3+    Comorbidities  HTN, osteoporosis; cardiac history; high cholesterol; peripheral artery  disease     Examination-Activity Limitations  Bathing;Hygiene/Grooming;Bed Mobility;Stairs;Stand;Reach Overhead;Carry;Sit;Locomotion Level    Rehab Potential  Good    PT Frequency  1x / week    PT Duration  8 weeks    PT Treatment/Interventions  ADLs/Self Care Home Management;Electrical Stimulation;Ultrasound;Moist Heat;Therapeutic activities;Patient/family education;Manual techniques;Dry needling;Neuromuscular re-education;Taping;Therapeutic exercise    PT Next Visit Plan  check TUG and other STGS next visit;  assess response to DN #1; manual therapy hip and lumbar region;  hip isometrics and abdominal isometrics    PT Home Exercise Plan  Access Code: [email or web address]        Patient will benefit from skilled therapeutic intervention in order to improve the following deficits and impairments:  Pain, Increased fascial restricitons, Decreased mobility, Postural dysfunction, Decreased activity tolerance, Decreased endurance, Decreased range of motion, Decreased strength, Impaired perceived functional ability, Decreased balance, Difficulty walking  Visit Diagnosis: Chronic bilateral low back pain with left-sided sciatica  Muscle weakness (generalized)  Difficulty in walking, not elsewhere classified     Problem List Patient Active Problem List   Diagnosis Date Noted  . Atrial fibrillation ([place]) 03/21/2014  . Embolism, pulmonary with infarction ([place]) 03/08/2014  . Severe tricuspid regurgitation by prior echocardiogram 02/23/2014  . Pulmonary hypertension ([place]) 02/23/2014  . Syncope and collapse 02/23/2014  . Pain in limb 06/07/2013  . PAD (peripheral artery disease) ([place]) 06/01/2013  . Syncope 04/02/2012  . NEOPLASMS UNSPEC NATURE BONE SOFT TISSUE&SKIN 05/31/2008  . Dyslipidemia 06/10/2006  . Essential hypertension 06/10/2006  . Osteoporosis 06/10/2006  . BREAST BIOPSY, HX OF 06/10/2006   Ruben Im, PT 06/30/18 10:10 AM Phone: [phone] Fax: [phone] Alvera Singh 06/30/2018, 10:09 AM  [facility] [address], [place], [place], [address] Phone: [phone]   Fax:  [phone]  Name: RAFEEF LAU MRN: [id number] Date of Birth: 09/06/1931

## 2018-06-30 NOTE — Patient Instructions (Signed)
     Trigger Point Dry Needling  . What is Trigger Point Dry Needling (DN)? o DN is a physical therapy technique used to treat muscle pain and dysfunction. Specifically, DN helps deactivate muscle trigger points (muscle knots).  o A thin filiform needle is used to penetrate the skin and stimulate the underlying trigger point. The goal is for a local twitch response (LTR) to occur and for the trigger point to relax. No medication of any kind is injected during the procedure.   . What Does Trigger Point Dry Needling Feel Like?  o The procedure feels different for each individual patient. Some patients report that they do not actually feel the needle enter the skin and overall the process is not painful. Very mild bleeding may occur. However, many patients feel a deep cramping in the muscle in which the needle was inserted. This is the local twitch response.   . How Will I feel after the treatment? o Soreness is normal, and the onset of soreness may not occur for a few hours. Typically this soreness does not last longer than two days.  o Bruising is uncommon, however; ice can be used to decrease any possible bruising.  o In rare cases feeling tired or nauseous after the treatment is normal. In addition, your symptoms may get worse before they get better, this period will typically not last longer than 24 hours.   . What Can I do After My Treatment? o Increase your hydration by drinking more water for the next 24 hours. o You may place ice or heat on the areas treated that have become sore, however, do not use heat on inflamed or bruised areas. Heat often brings more relief post needling. o You can continue your regular activities, but vigorous activity is not recommended initially after the treatment for 24 hours. o DN is best combined with other physical therapy such as strengthening, stretching, and other therapies.    Devri Kreher PT [facility] [address], [address] [place], [place] [address] Phone # [phone] Fax [phone] 

## 2018-07-07 ENCOUNTER — Other Ambulatory Visit: Payer: Self-pay

## 2018-07-07 ENCOUNTER — Encounter: Payer: Self-pay | Admitting: Physical Therapy

## 2018-07-07 ENCOUNTER — Ambulatory Visit: Payer: Medicare HMO | Admitting: Physical Therapy

## 2018-07-07 DIAGNOSIS — M5442 Lumbago with sciatica, left side: Secondary | ICD-10-CM

## 2018-07-07 DIAGNOSIS — G8929 Other chronic pain: Secondary | ICD-10-CM

## 2018-07-07 DIAGNOSIS — R262 Difficulty in walking, not elsewhere classified: Secondary | ICD-10-CM | POA: Diagnosis not present

## 2018-07-07 DIAGNOSIS — M6281 Muscle weakness (generalized): Secondary | ICD-10-CM

## 2018-07-07 NOTE — Therapy (Signed)
[facility] [address], [place], [place], [address] Phone: [phone]   Fax:  [phone]  Physical Therapy Treatment  Patient Details  Name: Tammy [name] MRN: [id number] Date of Birth: 1931-12-23 Referring Provider (PT): [name] NP   Encounter Date: 07/07/2018  PT End of Session - 07/07/18 1015    Visit Number  5    Date for PT Re-Evaluation  08/02/18    Authorization Type  Medicare [employer]    PT Start Time  0928    PT Stop Time  1015    PT Time Calculation (min)  47 min    Activity Tolerance  Patient tolerated treatment well       Past Medical History:  Diagnosis Date  . BREAST BIOPSY, HX OF 06/10/2006  . Breast cyst   . HIP PAIN, LEFT 01/31/2007  . HYPERLIPIDEMIA 06/10/2006  . HYPERTENSION 06/10/2006  . Impaired glucose tolerance   . NEOPLASMS UNSPEC NATURE BONE SOFT TISSUE&SKIN 05/31/2008  . OSTEOPOROSIS 06/10/2006  . Pulmonary embolus [facility])     Past Surgical History:  Procedure Laterality Date  . ABDOMINAL HYSTERECTOMY    . APPENDECTOMY    . BREAST SURGERY     bx x2  . CARDIOVERSION N/A 03/11/2017   Procedure: CARDIOVERSION;  Surgeon: Sanda Klein, MD;  [facility];  Service: Cardiovascular;  Laterality: N/A;  . CHOLECYSTECTOMY  1980    There were no vitals filed for this visit.  Subjective Assessment - 07/07/18 0928    Subjective  Hurts left lower lateral leg only mostly with walking.    No buttock pain since Monday.   My husband has noticed I'm doing a lot better.  I'm fearful of falling and that keeps me from walking too.    How long can you sit comfortably?  going better 15 - 30 minutes    How long can you stand comfortably?  10 min max    How long can you walk comfortably?  car into chair    Patient Stated Goals  no pain;  I'd like to go shopping, my husband does it now b/c I can't walk those aisles    Currently in Pain?  Yes    Pain Score  5     Pain Location  Leg    Pain  Orientation  Left;Lower         [facility] PT Assessment - 07/07/18 0001      Timed Up and Go Test   TUG  --   loss of balance when attempted to do without QC    Normal TUG (seconds)  16.48    TUG Comments  with QC                   [facility] Adult PT Treatment/Exercise - 07/07/18 0001      Knee/Hip Exercises: Aerobic   Nustep  L1  5 min       Knee/Hip Exercises: Seated   Long Arc Quad  Strengthening;Right;Left;10 reps    Long Arc Quad Limitations  green band    Clamshell with TheraBand  Green    Other Seated Knee/Hip Exercises  rocker board 30x    Marching  AROM;Both;10 reps    Sit to Sand  2 sets;5 reps   high table      Moist Heat Therapy   Number Minutes Moist Heat  5 Minutes    Moist Heat Location  Ankle   left lower leg  Manual Therapy   Soft tissue mobilization  left anterior tibialis, gastroc, peroneals       Trigger Point Dry Needling - 07/07/18 0001    Consent Given?  Yes    Muscles Treated Lower Quadrant  Peroneals;Anterior tibialis    Peroneals Response  Palpable increased muscle length    Anterior tibialis Response  Palpable increased muscle length       Left only      PT Short Term Goals - 07/07/18 1594      PT SHORT TERM GOAL #1   Title  The patient  will demonstrate compliance with an initial HEP (patient has been sedentary for 9 months)    Status  Achieved      PT SHORT TERM GOAL #2   Title  The patient will be able to stand 3-5 minutes with pain level 5/10 for meal prep    Status  Achieved      PT SHORT TERM GOAL #3   Title  Timed up and go improved to 24 sec indicating increased strength, mobility and balance    Status  Achieved        PT Long Term Goals - 06/07/18 1223      PT LONG TERM GOAL #1   Title  The patient will be independent with safe self progression of HEP     Time  8    Period  Weeks    Status  New    Target Date  08/02/18      PT LONG TERM GOAL #2   Title  The patient will be able to ambulate 80  feet with pain level 5/10    Time  8    Period  Weeks    Status  New      PT LONG TERM GOAL #3   Title  The patient will have improved trunk and LE strength to grossly 4-/5 needed for greater ease with sit to stand and to lift her leg into the shower tub.      Time  8    Period  Weeks    Status  New      PT LONG TERM GOAL #4   Title  Timed up and Go test improved to 21 sec indicating improved stength, mobility and balance    Time  8    Period  Weeks    Status  New      PT LONG TERM GOAL #5   Title  FOTO functional outcome score improved from 60% limitation to 49% indicating improved function with less pain    Time  8    Period  Weeks    Status  New            Plan - 07/07/18 1026    Clinical Impression Statement  Patient has made signficant improvements in Timed up and Go test and reports many functional improvements with sitting and standing tolerance.  A month ago she had been housebound for 2 months and now she is able to walk short distances from the car into church.  She has not tried to go shopping yet.   She reports good pain relief in buttock region from DN last visit and she requests trying this intervention on her lower leg today. She has signficant spasm and tender points in left peroneals and anterior tibialis muscles.   Following manual therapy and dry needling she has much improved soft tissue mobility and pain reduction.  She would benefit from continued  PT for progressive strengthening and balance ex and pain management interventions as needed.  All STGS met.    Rehab Potential  Good    PT Frequency  1x / week    PT Duration  8 weeks    PT Treatment/Interventions  ADLs/Self Care Home Management;Electrical Stimulation;Ultrasound;Moist Heat;Therapeutic activities;Patient/family education;Manual techniques;Dry needling;Neuromuscular re-education;Taping;Therapeutic exercise    PT Next Visit Plan  assess response to DN to lower leg; manual therapy hip and lumbar region;   Nu-step; balance ex's especially turning while walking;    LE strengthening sit to stand add to HEP    PT Home Exercise Plan  Access Code: [email or web address]        Patient will benefit from skilled therapeutic intervention in order to improve the following deficits and impairments:  Pain, Increased fascial restricitons, Decreased mobility, Postural dysfunction, Decreased activity tolerance, Decreased endurance, Decreased range of motion, Decreased strength, Impaired perceived functional ability, Decreased balance, Difficulty walking  Visit Diagnosis: Chronic bilateral low back pain with left-sided sciatica  Muscle weakness (generalized)  Difficulty in walking, not elsewhere classified     Problem List Patient Active Problem List   Diagnosis Date Noted  . Atrial fibrillation ([place]) 03/21/2014  . Embolism, pulmonary with infarction ([place]) 03/08/2014  . Severe tricuspid regurgitation by prior echocardiogram 02/23/2014  . Pulmonary hypertension ([place]) 02/23/2014  . Syncope and collapse 02/23/2014  . Pain in limb 06/07/2013  . PAD (peripheral artery disease) ([place]) 06/01/2013  . Syncope 04/02/2012  . NEOPLASMS UNSPEC NATURE BONE SOFT TISSUE&SKIN 05/31/2008  . Dyslipidemia 06/10/2006  . Essential hypertension 06/10/2006  . Osteoporosis 06/10/2006  . BREAST BIOPSY, HX OF 06/10/2006   Ruben Im, PT 07/07/18 12:23 PM Phone: [phone] Fax: [phone] Alvera Singh 07/07/2018, 12:22 PM  [facility] [address], [place], [place], [address] Phone: [phone]   Fax:  [phone]  Name: ODELIA GRACIANO MRN: [id number] Date of Birth: 18-Jul-1931

## 2018-07-21 ENCOUNTER — Encounter

## 2018-07-28 ENCOUNTER — Encounter: Payer: Self-pay | Admitting: Physical Therapy

## 2018-07-28 ENCOUNTER — Ambulatory Visit: Payer: Medicare HMO | Attending: Adult Health | Admitting: Physical Therapy

## 2018-07-28 ENCOUNTER — Other Ambulatory Visit: Payer: Self-pay

## 2018-07-28 DIAGNOSIS — G8929 Other chronic pain: Secondary | ICD-10-CM | POA: Diagnosis not present

## 2018-07-28 DIAGNOSIS — R262 Difficulty in walking, not elsewhere classified: Secondary | ICD-10-CM

## 2018-07-28 DIAGNOSIS — M6281 Muscle weakness (generalized): Secondary | ICD-10-CM | POA: Diagnosis not present

## 2018-07-28 DIAGNOSIS — M5442 Lumbago with sciatica, left side: Secondary | ICD-10-CM | POA: Insufficient documentation

## 2018-07-28 NOTE — Therapy (Signed)
[facility] [address] [place], [place], [address] Phone: [phone]   Fax:  [phone]  Physical Therapy Treatment  Patient Details  Name: Tammy [name] MRN: [id number] Date of Birth: August 24, 1931 Referring Provider (PT): [name] NP   Encounter Date: 07/28/2018  PT End of Session - 07/28/18 1023    Visit Number  6    Date for PT Re-Evaluation  08/02/18    Authorization Type  Medicare [employer]    PT Start Time  0926    PT Stop Time  1015    PT Time Calculation (min)  49 min    Activity Tolerance  Patient tolerated treatment well       Past Medical History:  Diagnosis Date  . BREAST BIOPSY, HX OF 06/10/2006  . Breast cyst   . HIP PAIN, LEFT 01/31/2007  . HYPERLIPIDEMIA 06/10/2006  . HYPERTENSION 06/10/2006  . Impaired glucose tolerance   . NEOPLASMS UNSPEC NATURE BONE SOFT TISSUE&SKIN 05/31/2008  . OSTEOPOROSIS 06/10/2006  . Pulmonary embolus [facility])     Past Surgical History:  Procedure Laterality Date  . ABDOMINAL HYSTERECTOMY    . APPENDECTOMY    . BREAST SURGERY     bx x2  . CARDIOVERSION N/A 03/11/2017   Procedure: CARDIOVERSION;  Surgeon: Sanda Klein, MD;  Location: [facility];  Service: Cardiovascular;  Laterality: N/A;  . CHOLECYSTECTOMY  1980    There were no vitals filed for this visit.  Subjective Assessment - 07/28/18 0928    Subjective  I hurt everywhere on that side.  Lower leg and buttock pain.  Presents with QC.  Every time we go to church my husband parks further away. As long as I have my cane I do OK.  Patient reports she knows ex's would help but has only been semi-compliant with doing them.    Pertinent History  uses Quad cane full time    How long can you sit comfortably?  better if I bend over some 15 minutes    How long can you walk comfortably?  20-30 yards    Patient Stated Goals  no pain;  I'd like to go shopping, my husband does it now b/c I can't walk those aisles    Currently in Pain?  Yes    Pain Score  5     Pain Location  Leg    Pain Orientation  Left    Pain Type  Chronic pain                       [facility] Adult PT Treatment/Exercise - 07/28/18 0001      Knee/Hip Exercises: Aerobic   Nustep  L1 6:33  min   while discussing progress     Knee/Hip Exercises: Standing   Heel Raises  Both;10 reps    Hip Abduction  AROM;Right;Left;5 reps    Hip Extension  AROM;Right;Left;5 reps      Knee/Hip Exercises: Seated   Long Arc Quad  AROM;Right;Left;10 reps    Clamshell with TheraBand  Green   double and single 3x10   Marching  AROM;Both;10 reps      Manual Therapy   Joint Mobilization  grade 2 left hip distraction and AP mobs 10 minutes    Soft tissue mobilization  left gluteals and piriformis; left peroneals    Kinesiotex  Facilitate Muscle      Kinesiotix   Facilitate Muscle   left peroneals and left gluteals  Trigger Point Dry Needling - 07/28/18 0001    Consent Given?  Yes    Peroneals Response  Palpable increased muscle length    Gluteus Minimus Response  Twitch response elicited;Palpable increased muscle length    Gluteus Medius Response  Twitch response elicited;Palpable increased muscle length    Gluteus Maximus Response  Twitch response elicited;Palpable increased muscle length    Piriformis Response  Palpable increased muscle length             PT Short Term Goals - 07/07/18 1601      PT SHORT TERM GOAL #1   Title  The patient  will demonstrate compliance with an initial HEP (patient has been sedentary for 9 months)    Status  Achieved      PT SHORT TERM GOAL #2   Title  The patient will be able to stand 3-5 minutes with pain level 5/10 for meal prep    Status  Achieved      PT SHORT TERM GOAL #3   Title  Timed up and go improved to 24 sec indicating increased strength, mobility and balance    Status  Achieved        PT Long Term Goals - 06/07/18 1223      PT LONG TERM GOAL #1   Title   The patient will be independent with safe self progression of HEP     Time  8    Period  Weeks    Status  New    Target Date  08/02/18      PT LONG TERM GOAL #2   Title  The patient will be able to ambulate 80 feet with pain level 5/10    Time  8    Period  Weeks    Status  New      PT LONG TERM GOAL #3   Title  The patient will have improved trunk and LE strength to grossly 4-/5 needed for greater ease with sit to stand and to lift her leg into the shower tub.      Time  8    Period  Weeks    Status  New      PT LONG TERM GOAL #4   Title  Timed up and Go test improved to 21 sec indicating improved stength, mobility and balance    Time  8    Period  Weeks    Status  New      PT LONG TERM GOAL #5   Title  FOTO functional outcome score improved from 60% limitation to 49% indicating improved function with less pain    Time  8    Period  Weeks    Status  New            Plan - 07/28/18 1023    Clinical Impression Statement  The patient returns after 3 weeks since last visit.  She reports her primary area of pain is left lower leg but also her buttock pain has returned.  Regular compliance was encouraged with her HEP in order to see improvements with pain reduction, strength and function.  Therapist closely monitoring response with all treatment interventions and providing supervision for safety when standing and ambulating in the clinic.    Personal Factors and Comorbidities  Age;Fitness;Time since onset of injury/illness/exacerbation;Finances;Comorbidity 1;Comorbidity 2;Comorbidity 3+    Comorbidities  HTN, osteoporosis; cardiac history; high cholesterol; peripheral artery disease     Rehab Potential  Good    PT  Frequency  1x / week    PT Duration  8 weeks    PT Next Visit Plan  do ERO;  check progress toward goals;  assess response to DN to lower leg, buttock muscles and kinesiotape;   manual therapy hip and lumbar region;  Nu-step; balance ex's;  LE strengthening sit to  stand    PT Home Exercise Plan  Access Code: [email or web address]        Patient will benefit from skilled therapeutic intervention in order to improve the following deficits and impairments:  Pain, Increased fascial restricitons, Decreased mobility, Postural dysfunction, Decreased activity tolerance, Decreased endurance, Decreased range of motion, Decreased strength, Impaired perceived functional ability, Decreased balance, Difficulty walking  Visit Diagnosis: 1. Chronic bilateral low back pain with left-sided sciatica   2. Muscle weakness (generalized)   3. Difficulty in walking, not elsewhere classified        Problem List Patient Active Problem List   Diagnosis Date Noted  . Atrial fibrillation ([place]) 03/21/2014  . Embolism, pulmonary with infarction ([place]) 03/08/2014  . Severe tricuspid regurgitation by prior echocardiogram 02/23/2014  . Pulmonary hypertension ([place]) 02/23/2014  . Syncope and collapse 02/23/2014  . Pain in limb 06/07/2013  . PAD (peripheral artery disease) ([place]) 06/01/2013  . Syncope 04/02/2012  . NEOPLASMS UNSPEC NATURE BONE SOFT TISSUE&SKIN 05/31/2008  . Dyslipidemia 06/10/2006  . Essential hypertension 06/10/2006  . Osteoporosis 06/10/2006  . BREAST BIOPSY, HX OF 06/10/2006   Ruben Im, PT 07/28/18 12:31 PM Phone: [phone] Fax: [phone]  Alvera Singh 07/28/2018, 12:30 PM  [facility] [address], [place], [place], [address] Phone: [phone]   Fax:  [phone]  Name: Tammy [name] MRN: [id number] Date of Birth: [birth year]

## 2018-08-04 ENCOUNTER — Other Ambulatory Visit: Payer: Self-pay

## 2018-08-04 ENCOUNTER — Ambulatory Visit: Payer: Medicare HMO | Admitting: Physical Therapy

## 2018-08-04 ENCOUNTER — Encounter: Payer: Self-pay | Admitting: Physical Therapy

## 2018-08-04 DIAGNOSIS — G8929 Other chronic pain: Secondary | ICD-10-CM

## 2018-08-04 DIAGNOSIS — M5442 Lumbago with sciatica, left side: Secondary | ICD-10-CM

## 2018-08-04 DIAGNOSIS — M6281 Muscle weakness (generalized): Secondary | ICD-10-CM | POA: Diagnosis not present

## 2018-08-04 DIAGNOSIS — R262 Difficulty in walking, not elsewhere classified: Secondary | ICD-10-CM

## 2018-08-04 NOTE — Therapy (Signed)
[facility] [address] [place], [place], [address] Phone: [phone]   Fax:  [phone]  Physical Therapy Treatment/Recertification   Patient Details  Name: Tammy [name] MRN: [id number] Date of Birth: 1931-10-14 Referring Provider (PT): [name] NP   Encounter Date: 08/04/2018  PT End of Session - 08/04/18 1223    Visit Number  7    Date for PT Re-Evaluation  09/29/18    Authorization Type  Medicare [employer]    PT Start Time  0927    PT Stop Time  1017    PT Time Calculation (min)  50 min    Activity Tolerance  Patient tolerated treatment well       Past Medical History:  Diagnosis Date  . BREAST BIOPSY, HX OF 06/10/2006  . Breast cyst   . HIP PAIN, LEFT 01/31/2007  . HYPERLIPIDEMIA 06/10/2006  . HYPERTENSION 06/10/2006  . Impaired glucose tolerance   . NEOPLASMS UNSPEC NATURE BONE SOFT TISSUE&SKIN 05/31/2008  . OSTEOPOROSIS 06/10/2006  . Pulmonary embolus [facility])     Past Surgical History:  Procedure Laterality Date  . ABDOMINAL HYSTERECTOMY    . APPENDECTOMY    . BREAST SURGERY     bx x2  . CARDIOVERSION N/A 03/11/2017   Procedure: CARDIOVERSION;  Surgeon: Sanda Klein, MD;  [facility];  Service: Cardiovascular;  Laterality: N/A;  . CHOLECYSTECTOMY  1980    There were no vitals filed for this visit.  Subjective Assessment - 08/04/18 0931    Subjective  I walked in the yard this week.  My husband walked with me.  I've got pain in my hip and lower leg. I think the needling helped.  I use the TENs 3-4x/week.  The tape helped my hip a little but irritated my lower leg so I took it off.  Moderate lower leg edema persists.    How long can you stand comfortably?  10 min max    How long can you walk comfortably?  100 feet    Patient Stated Goals  no pain;  I'd like to go shopping, my husband does it now b/c I can't walk those aisles    Currently in Pain?  Yes    Pain Score  5     Pain Location   Hip    Pain Orientation  Left    Pain Type  Chronic pain         [facility] PT Assessment - 08/04/18 0001      Observation/Other Assessments   Focus on Therapeutic Outcomes (FOTO)   43% limitation       Strength   Right Hip Flexion  4-/5    Right Hip ABduction  4-/5    Left Hip Flexion  3+/5    Left Hip ABduction  3+/5    Right Knee Flexion  4-/5    Right Knee Extension  4-/5    Left Knee Flexion  3+/5    Left Knee Extension  3+/5    Right Ankle Dorsiflexion  4-/5    Right Ankle Eversion  4-/5    Left Ankle Dorsiflexion  3+/5    Left Ankle Eversion  3+/5    Lumbar Flexion  3+/5    Lumbar Extension  3+/5      Ambulation/Gait   Gait Comments  94 feet with QC in 1 minute       Standardized Balance Assessment   Five times sit to stand comments   17 sec  Timed Up and Go Test   Normal TUG (seconds)  17.9    TUG Comments  with QC                   [facility] Adult PT Treatment/Exercise - 08/04/18 0001      Lumbar Exercises: Aerobic   Nustep  7 min L1    while discussing progress     Moist Heat Therapy   Number Minutes Moist Heat  5 Minutes    Moist Heat Location  Hip      Manual Therapy   Joint Mobilization  grade 2 left hip distraction and AP mobs 10 minutes    Soft tissue mobilization  left gluteals and piriformis; left peroneals       Trigger Point Dry Needling - 08/04/18 0001    Consent Given?  Yes    Peroneals Response  Palpable increased muscle length    Gluteus Minimus Response  Twitch response elicited;Palpable increased muscle length    Gluteus Medius Response  Twitch response elicited;Palpable increased muscle length    Gluteus Maximus Response  Twitch response elicited;Palpable increased muscle length    Piriformis Response  Palpable increased muscle length             PT Short Term Goals - 08/04/18 0943      PT SHORT TERM GOAL #1   Title  The patient  will demonstrate compliance with an initial HEP (patient has been sedentary for 9  months)    Status  Achieved      PT SHORT TERM GOAL #2   Title  The patient will be able to stand 3-5 minutes with pain level 5/10 for meal prep    Status  Achieved      PT SHORT TERM GOAL #3   Title  Timed up and go improved to 24 sec indicating increased strength, mobility and balance    Status  Achieved        PT Long Term Goals - 08/04/18 0944      PT LONG TERM GOAL #1   Title  The patient will be independent with safe self progression of HEP     Time  8    Period  Weeks    Status  On-going    Target Date  09/29/18      PT LONG TERM GOAL #2   Title  The patient will be able to ambulate 150 feet with pain level 5/10 needed for short community distances    Time  8    Period  Weeks    Status  Revised      PT LONG TERM GOAL #3   Title  The patient will have improved trunk and LE strength to grossly 4-/5 needed for greater ease with sit to stand and to lift her leg into the shower tub.      Time  8    Period  Weeks    Status  On-going      PT LONG TERM GOAL #4   Title  Timed up and Go test improved to 21 sec indicating improved stength, mobility and balance    Status  Achieved      PT LONG TERM GOAL #5   Title  FOTO functional outcome score improved from 60% limitation to 49% indicating improved function with less pain    Status  Achieved      PT LONG TERM GOAL #6   Title  5x sit to stand improved to  15 sec indicating improved LE strength needed for safety at home and in the community    Time  [place] - 08/04/18 1224    Clinical Impression Statement  The patient reports short term relief from DN to hip and lower leg and home use of TENS for pain control.  She has been compliant with the "easier" seated ex's.  She has increased her walking time to do short distances and around her yard with her husband's assist but has not attempted walking into the grocery store yet.  In the clinic, she is limited to 94 feet with QC.  Her Timed up and Go  score has improved from 38 sec on initial eval to 17.9 sec today.  Her sit to stand time has improved as well to 17 sec.  She would benefit from continued PT for functional strengthening, gait progression and pain relieving techniques. Without skilled PT she would have a decline in function. Progress has been slowed secondary to numerous co-morbidities and advanced age.    Personal Factors and Comorbidities  Age;Fitness;Time since onset of injury/illness/exacerbation;Finances;Comorbidity 1;Comorbidity 2;Comorbidity 3+    Comorbidities  HTN, osteoporosis; cardiac history; high cholesterol; peripheral artery disease     Examination-Activity Limitations  Bathing;Hygiene/Grooming;Bed Mobility;Stairs;Stand;Reach Overhead;Carry;Sit;Locomotion Level    Rehab Potential  Good    PT Frequency  1x / week    PT Duration  8 weeks    PT Treatment/Interventions  ADLs/Self Care Home Management;Electrical Stimulation;Ultrasound;Moist Heat;Therapeutic activities;Patient/family education;Manual techniques;Dry needling;Neuromuscular re-education;Taping;Therapeutic exercise    PT Next Visit Plan  Nu-Step;  add standing ex to HEP;  walking in clinic;  DN, manual therapy hip and lower leg;  balance ex    PT Home Exercise Plan  Access Code: [email or web address]        Patient will benefit from skilled therapeutic intervention in order to improve the following deficits and impairments:  Pain, Increased fascial restricitons, Decreased mobility, Postural dysfunction, Decreased activity tolerance, Decreased endurance, Decreased range of motion, Decreased strength, Impaired perceived functional ability, Decreased balance, Difficulty walking  Visit Diagnosis: 1. Chronic bilateral low back pain with left-sided sciatica   2. Muscle weakness (generalized)   3. Difficulty in walking, not elsewhere classified        Problem List Patient Active Problem List   Diagnosis Date Noted  . Atrial fibrillation ([place]) 03/21/2014  . Embolism,  pulmonary with infarction ([place]) 03/08/2014  . Severe tricuspid regurgitation by prior echocardiogram 02/23/2014  . Pulmonary hypertension ([place]) 02/23/2014  . Syncope and collapse 02/23/2014  . Pain in limb 06/07/2013  . PAD (peripheral artery disease) ([place]) 06/01/2013  . Syncope 04/02/2012  . NEOPLASMS UNSPEC NATURE BONE SOFT TISSUE&SKIN 05/31/2008  . Dyslipidemia 06/10/2006  . Essential hypertension 06/10/2006  . Osteoporosis 06/10/2006  . BREAST BIOPSY, HX OF 06/10/2006   Ruben Im, PT 08/04/18 4:34 PM Phone: [phone] Fax: [phone] Alvera Singh 08/04/2018, 4:33 PM  [facility] [address], [place], [place], [address] Phone: [phone]   Fax:  [phone]  Name: Tammy [name] MRN: [id number] Date of Birth: 1931-03-11

## 2018-08-11 ENCOUNTER — Ambulatory Visit: Payer: Medicare HMO | Admitting: Physical Therapy

## 2018-08-11 ENCOUNTER — Other Ambulatory Visit: Payer: Self-pay

## 2018-08-11 ENCOUNTER — Encounter: Payer: Self-pay | Admitting: Physical Therapy

## 2018-08-11 DIAGNOSIS — M6281 Muscle weakness (generalized): Secondary | ICD-10-CM | POA: Diagnosis not present

## 2018-08-11 DIAGNOSIS — R262 Difficulty in walking, not elsewhere classified: Secondary | ICD-10-CM

## 2018-08-11 DIAGNOSIS — G8929 Other chronic pain: Secondary | ICD-10-CM

## 2018-08-11 DIAGNOSIS — M5442 Lumbago with sciatica, left side: Secondary | ICD-10-CM

## 2018-08-11 NOTE — Therapy (Signed)
[facility] [address], [place], [place], [address] Phone: [phone]   Fax:  [phone]  Physical Therapy Treatment  Patient Details  Name: Tammy [name] MRN: [id number] Date of Birth: 06-Jun-1931 Referring Provider (PT): [name] NP   Encounter Date: 08/11/2018  PT End of Session - 08/11/18 1015    Visit Number  8    Date for PT Re-Evaluation  09/29/18    Authorization Type  Medicare [employer]    PT Start Time  0925    PT Stop Time  1010    PT Time Calculation (min)  45 min    Activity Tolerance  Patient tolerated treatment well       Past Medical History:  Diagnosis Date  . BREAST BIOPSY, HX OF 06/10/2006  . Breast cyst   . HIP PAIN, LEFT 01/31/2007  . HYPERLIPIDEMIA 06/10/2006  . HYPERTENSION 06/10/2006  . Impaired glucose tolerance   . NEOPLASMS UNSPEC NATURE BONE SOFT TISSUE&SKIN 05/31/2008  . OSTEOPOROSIS 06/10/2006  . Pulmonary embolus [facility])     Past Surgical History:  Procedure Laterality Date  . ABDOMINAL HYSTERECTOMY    . APPENDECTOMY    . BREAST SURGERY     bx x2  . CARDIOVERSION N/A 03/11/2017   Procedure: CARDIOVERSION;  Surgeon: Sanda Klein, MD;  [facility];  Service: Cardiovascular;  Laterality: N/A;  . CHOLECYSTECTOMY  1980    There were no vitals filed for this visit.  Subjective Assessment - 08/11/18 0935    Subjective  My lower leg hurts today.  My hip hurts off and on but not this morning.  I haven't walked much b/c of the lower leg pain and the heat.  I did walk in to church and may husband keeps parking further away.  The Dry needling seems to help and my home TENS.  I do the "easier" exercises at home.    Currently in Pain?  Yes    Pain Score  4     Pain Location  Leg    Pain Type  Chronic pain    Pain Frequency  Constant                       [facility] Adult PT Treatment/Exercise - 08/11/18 0001      Lumbar Exercises: Stretches   Active Hamstring  Stretch Limitations  foot on 2nd step 5x right/left       Lumbar Exercises: Aerobic   Nustep  10 min L1    while discussing progress     Knee/Hip Exercises: Standing   Hip Abduction  AROM;Right;Left;5 reps    Other Standing Knee Exercises  step taps 10x       Knee/Hip Exercises: Seated   Clamshell with TheraBand  Green   double and single 3x10   Marching  Strengthening;Right;Left;10 reps    Marching Limitations  green band around thighs    Sit to Sand  2 sets;5 reps   high table      Manual Therapy   Joint Mobilization  grade 2 left hip distraction and AP mobs 10 minutes    Soft tissue mobilization  left gluteals and piriformis; left peroneals       Trigger Point Dry Needling - 08/11/18 0001    Consent Given?  Yes    Peroneals Response  Palpable increased muscle length    Anterior tibialis Response  Palpable increased muscle length    Gluteus Minimus Response  Palpable increased  muscle length    Gluteus Medius Response  Palpable increased muscle length    Gluteus Maximus Response  Palpable increased muscle length             PT Short Term Goals - 08/04/18 0943      PT SHORT TERM GOAL #1   Title  The patient  will demonstrate compliance with an initial HEP (patient has been sedentary for 9 months)    Status  Achieved      PT SHORT TERM GOAL #2   Title  The patient will be able to stand 3-5 minutes with pain level 5/10 for meal prep    Status  Achieved      PT SHORT TERM GOAL #3   Title  Timed up and go improved to 24 sec indicating increased strength, mobility and balance    Status  Achieved        PT Long Term Goals - 08/04/18 0944      PT LONG TERM GOAL #1   Title  The patient will be independent with safe self progression of HEP     Time  8    Period  Weeks    Status  On-going    Target Date  09/29/18      PT LONG TERM GOAL #2   Title  The patient will be able to ambulate 150 feet with pain level 5/10 needed for short community distances    Time   8    Period  Weeks    Status  Revised      PT LONG TERM GOAL #3   Title  The patient will have improved trunk and LE strength to grossly 4-/5 needed for greater ease with sit to stand and to lift her leg into the shower tub.      Time  8    Period  Weeks    Status  On-going      PT LONG TERM GOAL #4   Title  Timed up and Go test improved to 21 sec indicating improved stength, mobility and balance    Status  Achieved      PT LONG TERM GOAL #5   Title  FOTO functional outcome score improved from 60% limitation to 49% indicating improved function with less pain    Status  Achieved      PT LONG TERM GOAL #6   Title  5x sit to stand improved to 15 sec indicating improved LE strength needed for safety at home and in the community    Time  8    Status  New            Plan - 08/11/18 1016    Clinical Impression Statement  The patient is able to increase number and intensity of exercises but is limited in standing tolerance to 2-3 minutes at a time.  She has moderate lower leg edema which is certainly a contributing factor to lower leg pain.  She plans to discuss with her doctor. She reports temporary pain relief from dry needling but feels this has helped especially in her posterior hip muscles.  Much improved soft tissue mobility and decreased tender point size and number following treatment session.  Therapist closely monitoring response with all interventions and providing close supervision in standing secondary to risk of falls.    Personal Factors and Comorbidities  Age;Fitness;Time since onset of injury/illness/exacerbation;Finances;Comorbidity 1;Comorbidity 2;Comorbidity 3+    Comorbidities  HTN, osteoporosis; cardiac history; high cholesterol; peripheral artery  disease     Rehab Potential  Good    PT Frequency  1x / week    PT Duration  8 weeks    PT Treatment/Interventions  ADLs/Self Care Home Management;Electrical Stimulation;Ultrasound;Moist Heat;Therapeutic  activities;Patient/family education;Manual techniques;Dry needling;Neuromuscular re-education;Taping;Therapeutic exercise    PT Next Visit Plan  Nu-Step;  add standing ex to HEP;  walking in clinic;  DN, manual therapy hip and lower leg;  balance ex    PT Home Exercise Plan  Access Code: [email or web address]        Patient will benefit from skilled therapeutic intervention in order to improve the following deficits and impairments:  Pain, Increased fascial restricitons, Decreased mobility, Postural dysfunction, Decreased activity tolerance, Decreased endurance, Decreased range of motion, Decreased strength, Impaired perceived functional ability, Decreased balance, Difficulty walking  Visit Diagnosis: 1. Chronic bilateral low back pain with left-sided sciatica   2. Muscle weakness (generalized)   3. Difficulty in walking, not elsewhere classified        Problem List Patient Active Problem List   Diagnosis Date Noted  . Atrial fibrillation ([place]) 03/21/2014  . Embolism, pulmonary with infarction ([place]) 03/08/2014  . Severe tricuspid regurgitation by prior echocardiogram 02/23/2014  . Pulmonary hypertension ([place]) 02/23/2014  . Syncope and collapse 02/23/2014  . Pain in limb 06/07/2013  . PAD (peripheral artery disease) ([place]) 06/01/2013  . Syncope 04/02/2012  . NEOPLASMS UNSPEC NATURE BONE SOFT TISSUE&SKIN 05/31/2008  . Dyslipidemia 06/10/2006  . Essential hypertension 06/10/2006  . Osteoporosis 06/10/2006  . BREAST BIOPSY, HX OF 06/10/2006   Ruben Im, PT 08/11/18 10:27 AM Phone: [phone] Fax: [phone] Alvera Singh 08/11/2018, 10:26 AM  [facility] [address] [place], [place], [address] Phone: [phone]   Fax:  [phone]  Name: TOSHIYE KEVER MRN: [id number] Date of Birth: [birth year]

## 2018-08-12 DIAGNOSIS — H5203 Hypermetropia, bilateral: Secondary | ICD-10-CM | POA: Diagnosis not present

## 2018-08-18 ENCOUNTER — Other Ambulatory Visit: Payer: Self-pay

## 2018-08-18 ENCOUNTER — Ambulatory Visit: Payer: Medicare HMO | Admitting: Physical Therapy

## 2018-08-18 ENCOUNTER — Encounter: Payer: Self-pay | Admitting: Physical Therapy

## 2018-08-18 DIAGNOSIS — G8929 Other chronic pain: Secondary | ICD-10-CM

## 2018-08-18 DIAGNOSIS — R262 Difficulty in walking, not elsewhere classified: Secondary | ICD-10-CM

## 2018-08-18 DIAGNOSIS — M6281 Muscle weakness (generalized): Secondary | ICD-10-CM

## 2018-08-18 DIAGNOSIS — M5442 Lumbago with sciatica, left side: Secondary | ICD-10-CM | POA: Diagnosis not present

## 2018-08-18 NOTE — Therapy (Signed)
[facility] [address] [place], [place], [address] Phone: [phone]   Fax:  [phone]  Physical Therapy Treatment  Patient Details  Name: Tammy [name] MRN: [id number] Date of Birth: 1931-03-27 Referring Provider (PT): [name] NP   Encounter Date: 08/18/2018  PT End of Session - 08/18/18 0933    Visit Number  9    Date for PT Re-Evaluation  09/29/18    Authorization Type  Medicare [employer]    PT Start Time  0924    PT Stop Time  1010    PT Time Calculation (min)  46 min    Activity Tolerance  Patient tolerated treatment well       Past Medical History:  Diagnosis Date  . BREAST BIOPSY, HX OF 06/10/2006  . Breast cyst   . HIP PAIN, LEFT 01/31/2007  . HYPERLIPIDEMIA 06/10/2006  . HYPERTENSION 06/10/2006  . Impaired glucose tolerance   . NEOPLASMS UNSPEC NATURE BONE SOFT TISSUE&SKIN 05/31/2008  . OSTEOPOROSIS 06/10/2006  . Pulmonary embolus [facility])     Past Surgical History:  Procedure Laterality Date  . ABDOMINAL HYSTERECTOMY    . APPENDECTOMY    . BREAST SURGERY     bx x2  . CARDIOVERSION N/A 03/11/2017   Procedure: CARDIOVERSION;  Surgeon: Sanda Klein, MD;  [facility];  Service: Cardiovascular;  Laterality: N/A;  . CHOLECYSTECTOMY  1980    There were no vitals filed for this visit.  Subjective Assessment - 08/18/18 0927    Subjective  I went to [employer] 2x.  Used the motorized cart the second time but I had to walk into it to get to the cart.  It was fatiguing but also painful in my ankle.  The dry needling helps for a little while.  Going to go to the cemetary and maybe out to eat later today.  The only time my ankle and hip don't hurt is when I lie on my right side.  I have not done my exercises this week.    Pertinent History  uses Quad cane full time    Patient Stated Goals  no pain;  I'd like to go shopping, my husband does it now b/c I can't walk those aisles    Currently in Pain?   Yes    Pain Score  9     Pain Location  Ankle    Pain Orientation  Left    Pain Type  Chronic pain    Pain Relieving Factors  right sidelying    Multiple Pain Sites  Yes    Pain Score  5    Pain Location  Hip    Pain Orientation  Left    Pain Type  Chronic pain                       [facility] Adult PT Treatment/Exercise - 08/18/18 0001      Lumbar Exercises: Aerobic   Nustep  10 min L1    while discussing progress     Knee/Hip Exercises: Standing   Heel Raises  Both;10 reps    Hip Abduction  AROM;Right;Left;10 reps    Hip Extension  AROM;Right;Left;10 reps    Other Standing Knee Exercises  step taps 10x     Other Standing Knee Exercises  weight shift side to side and front to back in staggered stand 10x each    minimal to no UE assist      Knee/Hip Exercises:  Seated   Sit to Sand  2 sets;5 reps   black foam in chair      Manual Therapy   Manual therapy comments  neural flossing ankle pumps and knee flex and ext in supine 10x each     Edema Management  retrograde massage to left lower leg 2 min     Joint Mobilization  grade 2 left hip distraction and AP mobs 10 minutes    Muscle Energy Technique  contract relax gluteals and piriformis               PT Short Term Goals - 08/18/18 1025      PT SHORT TERM GOAL #1   Title  The patient  will demonstrate compliance with an initial HEP (patient has been sedentary for 9 months)    Status  Achieved      PT SHORT TERM GOAL #2   Title  The patient will be able to stand 3-5 minutes with pain level 5/10 for meal prep    Status  Achieved      PT SHORT TERM GOAL #3   Title  Timed up and go improved to 24 sec indicating increased strength, mobility and balance    Status  Achieved        PT Long Term Goals - 08/18/18 1026      PT LONG TERM GOAL #1   Title  The patient will be independent with safe self progression of HEP     Time  8    Period  Weeks    Status  On-going      PT LONG TERM GOAL #2    Title  The patient will be able to ambulate 150 feet with pain level 5/10 needed for short community distances    Time  8    Period  Weeks    Status  On-going      PT LONG TERM GOAL #3   Title  The patient will have improved trunk and LE strength to grossly 4-/5 needed for greater ease with sit to stand and to lift her leg into the shower tub.      Time  8    Period  Weeks    Status  On-going      PT LONG TERM GOAL #4   Title  Timed up and Go test improved to 21 sec indicating improved stength, mobility and balance    Status  Achieved      PT LONG TERM GOAL #5   Title  FOTO functional outcome score improved from 60% limitation to 49% indicating improved function with less pain    Status  Achieved            Plan - 08/18/18 0933    Clinical Impression Statement  The patient is able to progress exercise intensity with increased number of repetitions and standing tolerance time today.   She reports increased functional mobility despite lack of compliance with HEP.  Increased gait speed and agility with sit to stand noted in the clinic today.  Improved lower leg edema following manual therapy and discussed home elevation and ankle pumps throughout the day.  Pain relief reported with manual techniques.  Therapist closely monitoring response with all treatment interventions.    Personal Factors and Comorbidities  Age;Fitness;Time since onset of injury/illness/exacerbation;Finances;Comorbidity 1;Comorbidity 2;Comorbidity 3+    Comorbidities  HTN, osteoporosis; cardiac history; high cholesterol; peripheral artery disease     Examination-Activity Limitations  Bathing;Hygiene/Grooming;Bed Mobility;Stairs;Stand;Reach Overhead;Carry;Sit;Locomotion Level  Rehab Potential  Good    PT Frequency  1x / week    PT Duration  8 weeks    PT Treatment/Interventions  ADLs/Self Care Home Management;Electrical Stimulation;Ultrasound;Moist Heat;Therapeutic activities;Patient/family education;Manual  techniques;Dry needling;Neuromuscular re-education;Taping;Therapeutic exercise    PT Next Visit Plan  10th visit progress note;  objective measures and goal check to determine continuation vs. discharge;  Nu-Step increase to L2;   add standing ex to HEP;  walking in clinic;  DN, manual therapy hip and lower leg;  balance ex    PT Home Exercise Plan  Access Code: [email or web address]        Patient will benefit from skilled therapeutic intervention in order to improve the following deficits and impairments:  Pain, Increased fascial restricitons, Decreased mobility, Postural dysfunction, Decreased activity tolerance, Decreased endurance, Decreased range of motion, Decreased strength, Impaired perceived functional ability, Decreased balance, Difficulty walking  Visit Diagnosis: 1. Chronic bilateral low back pain with left-sided sciatica   2. Muscle weakness (generalized)   3. Difficulty in walking, not elsewhere classified        Problem List Patient Active Problem List   Diagnosis Date Noted  . Atrial fibrillation ([place]) 03/21/2014  . Embolism, pulmonary with infarction ([place]) 03/08/2014  . Severe tricuspid regurgitation by prior echocardiogram 02/23/2014  . Pulmonary hypertension ([place]) 02/23/2014  . Syncope and collapse 02/23/2014  . Pain in limb 06/07/2013  . PAD (peripheral artery disease) ([place]) 06/01/2013  . Syncope 04/02/2012  . NEOPLASMS UNSPEC NATURE BONE SOFT TISSUE&SKIN 05/31/2008  . Dyslipidemia 06/10/2006  . Essential hypertension 06/10/2006  . Osteoporosis 06/10/2006  . BREAST BIOPSY, HX OF 06/10/2006   Ruben Im, PT 08/18/18 11:33 AM Phone: [phone] Fax: [phone] Alvera Singh 08/18/2018, 11:32 AM  [facility] [address] [place], [place], [address] Phone: [phone]   Fax:  [phone]  Name: YAMILEE HARMES MRN: [id number] Date of Birth: Jan 28, 1931

## 2018-08-24 ENCOUNTER — Telehealth: Payer: Self-pay

## 2018-08-24 DIAGNOSIS — M545 Low back pain: Secondary | ICD-10-CM | POA: Diagnosis not present

## 2018-08-24 DIAGNOSIS — M4316 Spondylolisthesis, lumbar region: Secondary | ICD-10-CM | POA: Diagnosis not present

## 2018-08-24 DIAGNOSIS — M5416 Radiculopathy, lumbar region: Secondary | ICD-10-CM | POA: Diagnosis not present

## 2018-08-24 DIAGNOSIS — Z6828 Body mass index (BMI) 28.0-28.9, adult: Secondary | ICD-10-CM | POA: Diagnosis not present

## 2018-08-24 DIAGNOSIS — M5126 Other intervertebral disc displacement, lumbar region: Secondary | ICD-10-CM | POA: Diagnosis not present

## 2018-08-24 DIAGNOSIS — I10 Essential (primary) hypertension: Secondary | ICD-10-CM | POA: Diagnosis not present

## 2018-08-24 NOTE — Telephone Encounter (Signed)
Copied from [place] [id number]. Topic: General - Other >> Aug 24, 2018 12:43 PM Leward Quan A wrote: Reason for CRM: Patient called to inquire of Tommi Rumps about her fluid pills furosemide (LASIX) 20 MG tablet states that her spine Dr told her to contact him to up the dosage of this medication due to swelling in her legs and feet for about a month. Ph# [phone]

## 2018-08-24 NOTE — Telephone Encounter (Signed)
Spoke to the pt.  She states she continues to take furosemide (LASIX) 20 MG tablet every day but legs continue to swell and are uncomfortable.  Pt would like to increase her medication or change it.  Advised that she should discuss with [facility].  She agreed to a telephone call.  Set up for 08/25/2018 @ 4:30.

## 2018-08-25 ENCOUNTER — Ambulatory Visit: Payer: Medicare HMO | Admitting: Physical Therapy

## 2018-08-25 ENCOUNTER — Encounter: Payer: Self-pay | Admitting: Physical Therapy

## 2018-08-25 ENCOUNTER — Ambulatory Visit (INDEPENDENT_AMBULATORY_CARE_PROVIDER_SITE_OTHER): Payer: Medicare HMO | Admitting: Adult Health

## 2018-08-25 ENCOUNTER — Encounter: Payer: Self-pay | Admitting: Adult Health

## 2018-08-25 ENCOUNTER — Other Ambulatory Visit: Payer: Self-pay

## 2018-08-25 DIAGNOSIS — R262 Difficulty in walking, not elsewhere classified: Secondary | ICD-10-CM

## 2018-08-25 DIAGNOSIS — M6281 Muscle weakness (generalized): Secondary | ICD-10-CM

## 2018-08-25 DIAGNOSIS — G8929 Other chronic pain: Secondary | ICD-10-CM | POA: Diagnosis not present

## 2018-08-25 DIAGNOSIS — E039 Hypothyroidism, unspecified: Secondary | ICD-10-CM | POA: Diagnosis not present

## 2018-08-25 DIAGNOSIS — R600 Localized edema: Secondary | ICD-10-CM | POA: Diagnosis not present

## 2018-08-25 DIAGNOSIS — M5442 Lumbago with sciatica, left side: Secondary | ICD-10-CM

## 2018-08-25 MED ORDER — LEVOTHYROXINE SODIUM 25 MCG PO TABS: 25.0000 ug | ORAL_TABLET | Freq: Every day | ORAL | 3 refills | Status: DC

## 2018-08-25 MED ORDER — FUROSEMIDE 40 MG PO TABS: 40.0000 mg | ORAL_TABLET | Freq: Every day | ORAL | 3 refills | Status: DC

## 2018-08-25 MED ORDER — FUROSEMIDE 20 MG PO TABS: 40.0000 mg | ORAL_TABLET | Freq: Every day | ORAL | 0 refills | Status: DC

## 2018-08-25 MED ORDER — POTASSIUM CHLORIDE CRYS ER 20 MEQ PO TBCR: 20.0000 meq | EXTENDED_RELEASE_TABLET | Freq: Every day | ORAL | 1 refills | Status: DC

## 2018-08-25 NOTE — Therapy (Signed)
[facility] [address] [place], [place], [address] Phone: [phone]   Fax:  [phone]  Physical Therapy Treatment/Discharge Summary   Patient Details  Name: Tammy [name] MRN: [id number] Date of Birth: 15-Sep-1931 Referring Provider (PT): [name] NP  Progress Note Reporting Period 06/07/18 to 08/25/18  See note below for Objective Data and Assessment of Progress/Goals.      Encounter Date: 08/25/2018  PT End of Session - 08/25/18 1023    Visit Number  10    Date for PT Re-Evaluation  09/29/18    Authorization Type  Medicare [employer]    PT Start Time  0925    PT Stop Time  1010    PT Time Calculation (min)  45 min    Activity Tolerance  Patient tolerated treatment well       Past Medical History:  Diagnosis Date  . BREAST BIOPSY, HX OF 06/10/2006  . Breast cyst   . HIP PAIN, LEFT 01/31/2007  . HYPERLIPIDEMIA 06/10/2006  . HYPERTENSION 06/10/2006  . Impaired glucose tolerance   . NEOPLASMS UNSPEC NATURE BONE SOFT TISSUE&SKIN 05/31/2008  . OSTEOPOROSIS 06/10/2006  . Pulmonary embolus [facility])     Past Surgical History:  Procedure Laterality Date  . ABDOMINAL HYSTERECTOMY    . APPENDECTOMY    . BREAST SURGERY     bx x2  . CARDIOVERSION N/A 03/11/2017   Procedure: CARDIOVERSION;  Surgeon: Sanda Klein, MD;  [facility];  Service: Cardiovascular;  Laterality: N/A;  . CHOLECYSTECTOMY  1980    There were no vitals filed for this visit.  Subjective Assessment - 08/25/18 0927    Subjective  My leg is killing me today.  I saw the spine doctor yesterday and he's going to do an MRI.  I think the testing bothered my lower leg.  Presents with bil moderate to severe ankle edema and redness.  She is going to talk to her PCP today at 4:30 about a fluid pill.    How long can you sit comfortably?  better if I bend over some 15 minutes    How long can you stand comfortably?  10 min max    How long can you  walk comfortably?  100 feet    Patient Stated Goals  no pain;  I'd like to go shopping, my husband does it now b/c I can't walk those aisles    Currently in Pain?  Yes    Pain Score  1     Pain Location  Hip    Multiple Pain Sites  Yes    Pain Score  10    Pain Location  Leg    Pain Orientation  Lower;Left         [facility] PT Assessment - 08/25/18 0001      Observation/Other Assessments   Focus on Therapeutic Outcomes (FOTO)   57% limitation       AROM   Lumbar Extension  5      Strength   Right Hip Flexion  4-/5    Left Hip Flexion  4-/5    Left Hip ABduction  4-/5    Right Knee Flexion  4-/5    Right Knee Extension  4-/5    Left Knee Flexion  3+/5    Left Knee Extension  3+/5    Right Ankle Dorsiflexion  4-/5    Right Ankle Eversion  4-/5    Left Ankle Dorsiflexion  3+/5    Left  Ankle Eversion  3+/5    Lumbar Flexion  3+/5    Lumbar Extension  3+/5      Ambulation/Gait   Gait Comments  80 feet with QC in 1 minute  no change in pain level but c/o shortness of breath       Standardized Balance Assessment   Five times sit to stand comments   19 sec       Timed Up and Go Test   Normal TUG (seconds)  20    TUG Comments  with QC                   [facility] Adult PT Treatment/Exercise - 08/25/18 0001      Lumbar Exercises: Standing   Other Standing Lumbar Exercises  review of standing counter top ex's in short bouts 45 sec to 1 min       Lumbar Exercises: Seated   Other Seated Lumbar Exercises  review of seated band HEP       Knee/Hip Exercises: Seated   Sit to Sand  5 reps      Manual Therapy   Edema Management  retrograde massage to left lower leg for edema control and pain management 8 min                PT Short Term Goals - 08/25/18 1030      PT SHORT TERM GOAL #1   Title  The patient  will demonstrate compliance with an initial HEP (patient has been sedentary for 9 months)    Status  Achieved      PT SHORT TERM GOAL #2   Title  The  patient will be able to stand 3-5 minutes with pain level 5/10 for meal prep    Status  Achieved      PT SHORT TERM GOAL #3   Title  Timed up and go improved to 24 sec indicating increased strength, mobility and balance    Status  Achieved        PT Long Term Goals - 08/25/18 0934      PT LONG TERM GOAL #1   Title  The patient will be independent with safe self progression of HEP     Status  Achieved      PT LONG TERM GOAL #2   Title  The patient will be able to ambulate 150 feet with pain level 5/10 needed for short community distances    Status  Partially Met      PT LONG TERM GOAL #3   Title  The patient will have improved trunk and LE strength to grossly 4-/5 needed for greater ease with sit to stand and to lift her leg into the shower tub.      Status  Partially Met      PT LONG TERM GOAL #4   Title  Timed up and Go test improved to 21 sec indicating improved stength, mobility and balance    Status  Achieved      PT LONG TERM GOAL #5   Title  FOTO functional outcome score improved from 60% limitation to 49% indicating improved function with less pain    Status  Partially Met      PT LONG TERM GOAL #6   Title  5x sit to stand improved to 15 sec indicating improved LE strength needed for safety at home and in the community    Status  Partially Met  Plan - 08/25/18 1023    Clinical Impression Statement  Although patient is "having a bad day" with increased pain in left lower leg, she has made improvements in Timed up and Go and 5x sit to stand tests.  On initial evaluation she was unable to rise from a standard chair without considerable UE assist.  She is now able to do 5x consecutively without UE assist.  She has made good strength gains in hip and knee musculature however her chronic bil lower leg edema has been very painful and limiting in function.  She is able to ambulate short distances in the community to church, the drug store and the cemetary using  her quad cane now whereas in May, she was completely housebound.  She is undergoing diagnostic testing with a spine doctor and she would like to discontinue PT at this time.  She has been instructed in a HEP although she reports only occasional compliance.  Will discharge from PT at this time with partial goals met.    Comorbidities  HTN, osteoporosis; cardiac history; high cholesterol; peripheral artery disease     Examination-Activity Limitations  Bathing;Hygiene/Grooming;Bed Mobility;Stairs;Stand;Reach Overhead;Carry;Sit;Locomotion Level    Rehab Potential  Good    PT Frequency  1x / week    PT Duration  8 weeks    PT Treatment/Interventions  ADLs/Self Care Home Management;Electrical Stimulation;Ultrasound;Moist Heat;Therapeutic activities;Patient/family education;Manual techniques;Dry needling;Neuromuscular re-education;Taping;Therapeutic exercise    PT Home Exercise Plan  Access Code: [email or web address]        Patient will benefit from skilled therapeutic intervention in order to improve the following deficits and impairments:  Pain, Increased fascial restricitons, Decreased mobility, Postural dysfunction, Decreased activity tolerance, Decreased endurance, Decreased range of motion, Decreased strength, Impaired perceived functional ability, Decreased balance, Difficulty walking  Visit Diagnosis: 1. Chronic bilateral low back pain with left-sided sciatica   2. Muscle weakness (generalized)   3. Difficulty in walking, not elsewhere classified     PHYSICAL THERAPY DISCHARGE SUMMARY  Visits from Start of Care: 10  Current functional level related to goals / functional outcomes: See clinical impressions above   Remaining deficits: As above   Education / Equipment: HEP Plan: Patient agrees to discharge.  Patient goals were partially met. Patient is being discharged due to the patient's request.  ?????          Problem List Patient Active Problem List   Diagnosis Date Noted  . Atrial  fibrillation ([place]) 03/21/2014  . Embolism, pulmonary with infarction ([place]) 03/08/2014  . Severe tricuspid regurgitation by prior echocardiogram 02/23/2014  . Pulmonary hypertension ([place]) 02/23/2014  . Syncope and collapse 02/23/2014  . Pain in limb 06/07/2013  . PAD (peripheral artery disease) ([place]) 06/01/2013  . Syncope 04/02/2012  . NEOPLASMS UNSPEC NATURE BONE SOFT TISSUE&SKIN 05/31/2008  . Dyslipidemia 06/10/2006  . Essential hypertension 06/10/2006  . Osteoporosis 06/10/2006  . BREAST BIOPSY, HX OF 06/10/2006   Ruben Im, PT 08/25/18 10:34 AM Phone: [phone] Fax: [phone] Alvera Singh 08/25/2018, 10:32 AM  [facility] [address] [place], [place], [address] Phone: [phone]   Fax:  [phone]  Name: Tammy [name] MRN: [id number] Date of Birth: 09-18-1931

## 2018-08-25 NOTE — Progress Notes (Signed)
Virtual Visit via Telephone Note  I connected with Tammy [name] on 08/25/18 at  4:30 PM EDT by telephone and verified that I am speaking with the correct person using two identifiers.   I discussed the limitations, risks, security and privacy concerns of performing an evaluation and management service by telephone and the availability of in person appointments. I also discussed with the patient that there may be a patient responsible charge related to this service. The patient expressed understanding and agreed to proceed.  Location patient: home Location provider: work or home office Participants present for the call: patient, provider Patient did not have a visit in the prior 7 days to address this/these issue(s).   History of Present Illness: 85 year old female who is being evaluated today for chronic issue of bilateral lower extremity edema.  Has been a chronic issue for her for about a year at this point in time.  She is currently taking Lasix 20 mg tablets but believe that this is not working well for her.  She continues to have severe pain in both legs from the edema.  Does not get a lot of relief with elevation of the legs.  He has been seen a neurosurgeon recently who advised her to contact me about increasing her diuretic.  She was last seen by vascular surgery in May 2020 at that time her ABIs remained stable with normal on the right and stable moderate disease in the left.  Additionally, she needs a refill of Synthroid   Observations/Objective: Patient sounds cheerful and well on the phone. I do not appreciate any SOB. Speech and thought processing are grossly intact. Patient reported vitals:  Assessment and Plan: 1. Lower extremity edema - Will increase Lasix from 20 mg to 40 mg. Add K supplement  - Follow up in one week  - potassium chloride SA (K-DUR) 20 MEQ tablet; Take 1 tablet (20 mEq total) by mouth daily.  Dispense: 90 tablet; Refill: 1 - furosemide (LASIX) 40 MG  tablet; Take 1 tablet (40 mg total) by mouth daily.  Dispense: 30 tablet; Refill: 3  2. Hypothyroidism, unspecified type - levothyroxine (SYNTHROID) 25 MCG tablet; Take 1 tablet (25 mcg total) by mouth daily.  Dispense: 90 tablet; Refill: [place], NP   Follow Up Instructions: I did not refer this patient for an OV in the next 24 hours for this/these issue(s).  I discussed the assessment and treatment plan with the patient. The patient was provided an opportunity to ask questions and all were answered. The patient agreed with the plan and demonstrated an understanding of the instructions.   The patient was advised to call back or seek an in-person evaluation if the symptoms worsen or if the condition fails to improve as anticipated.  I provided 20 minutes of non-face-to-face time during this encounter.   [name], NP

## 2018-09-14 ENCOUNTER — Telehealth: Payer: Self-pay | Admitting: *Deleted

## 2018-09-14 DIAGNOSIS — M5137 Other intervertebral disc degeneration, lumbosacral region: Secondary | ICD-10-CM | POA: Diagnosis not present

## 2018-09-14 DIAGNOSIS — I10 Essential (primary) hypertension: Secondary | ICD-10-CM | POA: Diagnosis not present

## 2018-09-14 DIAGNOSIS — M4316 Spondylolisthesis, lumbar region: Secondary | ICD-10-CM | POA: Diagnosis not present

## 2018-09-14 DIAGNOSIS — Z6827 Body mass index (BMI) 27.0-27.9, adult: Secondary | ICD-10-CM | POA: Diagnosis not present

## 2018-09-14 DIAGNOSIS — M5416 Radiculopathy, lumbar region: Secondary | ICD-10-CM | POA: Diagnosis not present

## 2018-09-14 DIAGNOSIS — M5127 Other intervertebral disc displacement, lumbosacral region: Secondary | ICD-10-CM | POA: Diagnosis not present

## 2018-09-14 DIAGNOSIS — M545 Low back pain: Secondary | ICD-10-CM | POA: Diagnosis not present

## 2018-09-14 DIAGNOSIS — M5126 Other intervertebral disc displacement, lumbar region: Secondary | ICD-10-CM | POA: Diagnosis not present

## 2018-09-14 NOTE — Telephone Encounter (Signed)
   [facility] Pre-operative Risk Assessment    Request for surgical clearance:  1. What type of surgery is being performed? LEFT L5-S1 TRANSFORAMINAL LUMBAR INTERBODY FUSION   2. When is this surgery scheduled? TBD   3. What type of clearance is required (medical clearance vs. Pharmacy clearance to hold med vs. Both)? BOTH   4. Are there any medications that need to be held prior to surgery and how long?ELIQUIS   5. Practice name and name of physician performing surgery? [facility]    6. What is your office phone number? [phone]   7.   What is your office fax number? [place]  8.   Anesthesia type (None, local, MAC, general) ?  UNKNOWN

## 2018-09-15 NOTE — Telephone Encounter (Signed)
Pt scheduled to see Dr. Stanford Breed 09/26/2018 and pt aware surgical clearance will be addressed at that appt. Will route back to requesting surgeon's office to make them aware.

## 2018-09-15 NOTE — Telephone Encounter (Signed)
   Primary Cardiologist:Brian Stanford Breed, MD  Chart reviewed as part of pre-operative protocol coverage. Because of Tammy [name]'s past medical history and time since last visit, he/she will require a follow-up visit in order to better assess preoperative cardiovascular risk.  Pre-op covering staff: - Please schedule appointment and call patient to inform them. - Please contact requesting surgeon's office via preferred method (i.e, phone, fax) to inform them of need for appointment prior to surgery.  If applicable, this message will also be routed to pharmacy pool and/or primary cardiologist for input on holding anticoagulant/antiplatelet agent as requested below so that this information is available at time of patient's appointment.   Kathyrn Drown, NP  09/15/2018, 8:49 AM

## 2018-09-15 NOTE — Telephone Encounter (Signed)
Patient with diagnosis of Afib and hx of PE on Eliquis for anticoagulation.    Procedure:  LEFT L5-S1 TRANSFORAMINAL LUMBAR INTERBODY FUSION  Date of procedure: TBD  CHADS2-VASc score of  7 (HTN, AGE, stroke/tia x 2 PE, CAD, AGE, female)  CrCl 34 ml.min  Office protocol would dictate that due to spinal procedure it is recommended that patient hold anticoagulation 3 days prior to procedure, however patient is at increased risk off anticoagulation due to history of PE. I will route to MD for input.

## 2018-09-22 NOTE — Progress Notes (Signed)
HPI: FU syncopeand atrial fibrillation. Patient with history of syncope felt secondary to autonomic dysfunction. Previously loop monitor considered; Dr Lovena Le felt it would be indicated if recurrent syncope. H/O pulmonary embolus; seen by pulmonary and chronic anticoagulation felt indicated. Peripheral vascular disease followed by Dr. Scot Dock. Echocardiogram November 2018 showed normal LV function, biatrial enlargement and moderate tricuspid regurgitation. Holter monitor November 2018 showed atrial fibrillation with PVCs or aberrantly conducted beats rate controlled.Patient previously placed on amiodarone and underwent successful cardioversion to sinus rhythm February 2019. Since last seen,she has some dyspnea on exertion unchanged.  No orthopnea, PND, chest pain or syncope.  Chronic pedal edema that is controlled with diuretics.  Current Outpatient Medications  Medication Sig Dispense Refill  . acetaminophen (TYLENOL) 500 MG tablet Take 500 mg by mouth every 6 (six) hours as needed for moderate pain or headache.    Marland Kitchen amiodarone (PACERONE) 200 MG tablet TAKE ONE TABLET TWICE DAILY FOR ONE WEEK THEN DECREASE TO 1 TABLET DAILY 135 tablet 2  . apixaban (ELIQUIS) 2.5 MG TABS tablet Take 1 tablet (2.5 mg total) by mouth 2 (two) times daily. 180 tablet 3  . atorvastatin (LIPITOR) 20 MG tablet Take 1 tablet (20 mg total) by mouth daily at 6 PM. 90 tablet 3  . furosemide (LASIX) 40 MG tablet Take 1 tablet (40 mg total) by mouth daily. 30 tablet 3  . levothyroxine (SYNTHROID) 25 MCG tablet Take 1 tablet (25 mcg total) by mouth daily. 90 tablet 3  . metoprolol succinate (TOPROL-XL) 25 MG 24 hr tablet TAKE 1 TABLET BY MOUTH EVERY DAY 90 tablet 3  . potassium chloride SA (K-DUR) 20 MEQ tablet Take 1 tablet (20 mEq total) by mouth daily. 90 tablet 1  . amLODipine (NORVASC) 10 MG tablet Take 1 tablet (10 mg total) by mouth daily. 90 tablet 3   No current facility-administered medications for this  visit.      Past Medical History:  Diagnosis Date  . BREAST BIOPSY, HX OF 06/10/2006  . Breast cyst   . HIP PAIN, LEFT 01/31/2007  . HYPERLIPIDEMIA 06/10/2006  . HYPERTENSION 06/10/2006  . Impaired glucose tolerance   . NEOPLASMS UNSPEC NATURE BONE SOFT TISSUE&SKIN 05/31/2008  . OSTEOPOROSIS 06/10/2006  . Pulmonary embolus [facility])     Past Surgical History:  Procedure Laterality Date  . ABDOMINAL HYSTERECTOMY    . APPENDECTOMY    . BREAST SURGERY     bx x2  . CARDIOVERSION N/A 03/11/2017   Procedure: CARDIOVERSION;  Surgeon: Sanda Klein, MD;  Location: [facility] END[facility];  Service: Cardiovascular;  Laterality: N/A;  . CHOLECYSTECTOMY  1980    Social History   Socioeconomic History  . Marital status: Married    Spouse name: Not on file  . Number of children: 4  . Years of education: Not on file  . Highest education level: Not on file  Occupational History  . Not on file  Social Needs  . Financial resource strain: Not on file  . Food insecurity    Worry: Not on file    Inability: Not on file  . Transportation needs    Medical: Not on file    Non-medical: Not on file  Tobacco Use  . Smoking status: Never Smoker  . Smokeless tobacco: Current User    Types: Snuff  Substance and Sexual Activity  . Alcohol use: No    Alcohol/week: 0.0 standard drinks  . Drug use: No  . Sexual activity: Yes  Lifestyle  .  Physical activity    Days per week: Not on file    Minutes per session: Not on file  . Stress: Not on file  Relationships  . Social Herbalist on phone: Not on file    Gets together: Not on file    Attends religious service: Not on file    Active member of club or organization: Not on file    Attends meetings of clubs or organizations: Not on file    Relationship status: Not on file  . Intimate partner violence    Fear of current or ex partner: Not on file    Emotionally abused: Not on file    Physically abused: Not on file    Forced sexual activity: Not  on file  Other Topics Concern  . Not on file  Social History Narrative   She is retired    Worked in [employer] for 20 years    Married for 43 years    4 children - live locally.         Family History  Problem Relation Age of Onset  . Heart disease Mother        Pacemaker  . Heart disease Father   . Asthma Father   . Diabetes Father   . Cancer Maternal Aunt        breast  . Heart disease Sister   . Hyperlipidemia Sister   . Hypertension Sister   . Lung cancer Sister     ROS: Low back pain but no fevers or chills, productive cough, hemoptysis, dysphasia, odynophagia, melena, hematochezia, dysuria, hematuria, rash, seizure activity, orthopnea, PND, or  claudication. Remaining systems are negative.  Physical Exam: Well-developed well-nourished in no acute distress.  Skin is warm and dry.  HEENT is normal.  Neck is supple.  Chest is clear to auscultation with normal expansion.  Cardiovascular exam is regular rate and rhythm.  Abdominal exam nontender or distended. No masses palpated. Extremities show trace edema. neuro grossly intact  ECG-sinus rhythm with first-degree AV block, right bundle branch block.  Personally reviewed  A/P  1 paroxysmal atrial fibrillation-patient remains in sinus rhythm.  Continue amiodarone and apixaban.  Check TSH, liver functions and chest x-ray.  Check hemoglobin and renal function.  Note her creatinine has hovered between 1.3-1.5 and we have therefore continued at 2.5 mg twice daily for Eliquis dosing.  2 preoperative evaluation prior to back surgery-patient is not having chest pain.  We will hold apixaban 3 days prior to procedure and resume after when okay with surgery.  3 hypertension-patient's blood pressure is controlled today.  Continue present medications.  4 prior pulmonary embolus-pulmonary recommends long-term anticoagulation.  Continue apixaban.  5 history of syncope-no recurrences.  Will consider implantable loop monitor in  the future for recurrences.  6 hyperlipidemia-continue statin.  7 peripheral vascular disease-continue statin.  No aspirin given need for anticoagulation.  8 tricuspid regurgitation-we will arrange follow-up echocardiogram.  Kirk Ruths, MD

## 2018-09-26 ENCOUNTER — Ambulatory Visit (INDEPENDENT_AMBULATORY_CARE_PROVIDER_SITE_OTHER): Payer: Medicare HMO | Admitting: Cardiology

## 2018-09-26 ENCOUNTER — Encounter: Payer: Self-pay | Admitting: Cardiology

## 2018-09-26 ENCOUNTER — Other Ambulatory Visit: Payer: Self-pay

## 2018-09-26 ENCOUNTER — Ambulatory Visit (HOSPITAL_COMMUNITY): Payer: Medicare HMO | Attending: Cardiovascular Disease

## 2018-09-26 VITALS — BP 126/52 | HR 51 | Temp 97.2°F | Ht 67.0 in | Wt 173.4 lb

## 2018-09-26 DIAGNOSIS — I079 Rheumatic tricuspid valve disease, unspecified: Secondary | ICD-10-CM

## 2018-09-26 DIAGNOSIS — I480 Paroxysmal atrial fibrillation: Secondary | ICD-10-CM

## 2018-09-26 DIAGNOSIS — R609 Edema, unspecified: Secondary | ICD-10-CM | POA: Diagnosis not present

## 2018-09-26 DIAGNOSIS — I10 Essential (primary) hypertension: Secondary | ICD-10-CM | POA: Diagnosis not present

## 2018-09-26 LAB — ECHOCARDIOGRAM COMPLETE
Height: 67 in
Weight: 2774.4 oz

## 2018-09-26 LAB — HEPATIC FUNCTION PANEL
ALT: 38 IU/L — ABNORMAL HIGH (ref 0–32)
AST: 36 IU/L (ref 0–40)
Albumin: 4.7 g/dL — ABNORMAL HIGH (ref 3.6–4.6)
Alkaline Phosphatase: 82 IU/L (ref 39–117)
Bilirubin Total: 0.7 mg/dL (ref 0.0–1.2)
Bilirubin, Direct: 0.22 mg/dL (ref 0.00–0.40)
Total Protein: 7.2 g/dL (ref 6.0–8.5)

## 2018-09-26 LAB — BASIC METABOLIC PANEL
BUN/Creatinine Ratio: 21 (ref 12–28)
BUN: 44 mg/dL — ABNORMAL HIGH (ref 8–27)
CO2: 21 mmol/L (ref 20–29)
Calcium: 10.3 mg/dL (ref 8.7–10.3)
Chloride: 104 mmol/L (ref 96–106)
Creatinine, Ser: 2.12 mg/dL — ABNORMAL HIGH (ref 0.57–1.00)
GFR calc Af Amer: 24 mL/min/{1.73_m2} — ABNORMAL LOW (ref >59)
GFR calc non Af Amer: 21 mL/min/{1.73_m2} — ABNORMAL LOW (ref >59)
Glucose: 118 mg/dL — ABNORMAL HIGH (ref 65–99)
Potassium: 5.5 mmol/L — ABNORMAL HIGH (ref 3.5–5.2)
Sodium: 141 mmol/L (ref 134–144)

## 2018-09-26 LAB — CBC
Hematocrit: 42.1 % (ref 34.0–46.6)
Hemoglobin: 13.9 g/dL (ref 11.1–15.9)
MCH: 31.4 pg (ref 26.6–33.0)
MCHC: 33 g/dL (ref 31.5–35.7)
MCV: 95 fL (ref 79–97)
Platelets: 286 10*3/uL (ref 150–450)
RBC: 4.43 x10E6/uL (ref 3.77–5.28)
RDW: 13.3 % (ref 11.7–15.4)
WBC: 8.8 10*3/uL (ref 3.4–10.8)

## 2018-09-26 LAB — TSH: TSH: 4.22 u[IU]/mL (ref 0.450–4.500)

## 2018-09-26 NOTE — Patient Instructions (Signed)
Medication Instructions:  NO CHANGE If you need a refill on your cardiac medications before your next appointment, please call your pharmacy.   Lab work: Your physician recommends that you HAVE LAB WORK TODAY If you have labs (blood work) drawn today and your tests are completely normal, you will receive your results only by: Marland Kitchen MyChart Message (if you have MyChart) OR . A paper copy in the mail If you have any lab test that is abnormal or we need to change your treatment, we will call you to review the results.  Testing/Procedures: Your physician has requested that you have an echocardiogram. Echocardiography is a painless test that uses sound waves to create images of your heart. It provides your doctor with information about the size and shape of your heart and how well your heart's chambers and valves are working. This procedure takes approximately one hour. There are no restrictions for this procedure. [place]  A chest x-ray takes a picture of the organs and structures inside the chest, including the heart, lungs, and blood vessels. This test can show several things, including, whether the heart is enlarges; whether fluid is building up in the lungs; and whether pacemaker / defibrillator leads are still in place. [place]    Follow-Up: At [facility], you and your health needs are our priority.  As part of our continuing mission to provide you with exceptional heart care, we have created designated Provider Care Teams.  These Care Teams include your primary Cardiologist (physician) and Advanced Practice Providers (APPs -  Physician Assistants and Nurse Practitioners) who all work together to provide you with the care you need, when you need it. You will need a follow up appointment in 6 months.  Please call our office 2 months in advance to schedule this appointment.  You may see Kirk Ruths, MD or one of the following Advanced Practice  Providers on your designated Care Team:   Kerin Ransom, PA-C Roby Lofts, [place] . Sande Rives, PA-C

## 2018-09-27 ENCOUNTER — Telehealth: Payer: Self-pay | Admitting: *Deleted

## 2018-09-27 ENCOUNTER — Other Ambulatory Visit (HOSPITAL_COMMUNITY): Payer: Self-pay | Admitting: Cardiology

## 2018-09-27 ENCOUNTER — Other Ambulatory Visit: Payer: Self-pay | Admitting: Neurosurgery

## 2018-09-27 DIAGNOSIS — Q2112 Patent foramen ovale: Secondary | ICD-10-CM

## 2018-09-27 DIAGNOSIS — E875 Hyperkalemia: Secondary | ICD-10-CM

## 2018-09-27 DIAGNOSIS — Q211 Atrial septal defect: Secondary | ICD-10-CM

## 2018-09-27 NOTE — Telephone Encounter (Signed)
Spoke with pt, she voiced understanding to stop the lasix and potassium. Lab orders mailed to the pt and Follow up scheduled for the echo.

## 2018-09-27 NOTE — Telephone Encounter (Signed)
   Primary Cardiologist: Kirk Ruths, MD  Patient was seen in the office by Dr. Stanford Breed on 09/26/2018. Patient on Apixaban for paroxsymal atrial fibrillation and prior PE. Currently, maintaining sinus rhythm on Amiodarone. No known history of CAD or CHF. Per Dr. Jacalyn Lefevre note, "Patient is not having chest pain. We will hold Apixaban 3 days prior to procedure and resume after when okay with surgery."   Given past medical history and time since last visit, based on ACC/AHA guidelines, Tammy [name] would be at acceptable risk for the planned procedure without further cardiovascular testing.   I will route this recommendation to the requesting party via Epic fax function and remove from pre-op pool.  Please call with questions.  Darreld Mclean, PA-C 09/27/2018, 8:40 AM

## 2018-09-27 NOTE — Telephone Encounter (Addendum)
Unable to reach pt or leave a message    ----- Message from Lelon Perla, MD sent at 09/26/2018  5:01 PM EDT ----- DC lasix and KCL; bmet one week Kirk Ruths  FU limited echo to focus on atrial septum and bubble study to R/O ASD  Kirk Ruths

## 2018-09-30 ENCOUNTER — Other Ambulatory Visit: Payer: Self-pay

## 2018-09-30 ENCOUNTER — Ambulatory Visit (HOSPITAL_COMMUNITY): Payer: Medicare HMO | Attending: Cardiovascular Disease

## 2018-09-30 DIAGNOSIS — Q2112 Patent foramen ovale: Secondary | ICD-10-CM

## 2018-09-30 DIAGNOSIS — Q211 Atrial septal defect: Secondary | ICD-10-CM | POA: Insufficient documentation

## 2018-10-04 DIAGNOSIS — E875 Hyperkalemia: Secondary | ICD-10-CM | POA: Diagnosis not present

## 2018-10-04 LAB — BASIC METABOLIC PANEL
BUN/Creatinine Ratio: 24 (ref 12–28)
BUN: 38 mg/dL — ABNORMAL HIGH (ref 8–27)
CO2: 19 mmol/L — ABNORMAL LOW (ref 20–29)
Calcium: 9.4 mg/dL (ref 8.7–10.3)
Chloride: 107 mmol/L — ABNORMAL HIGH (ref 96–106)
Creatinine, Ser: 1.61 mg/dL — ABNORMAL HIGH (ref 0.57–1.00)
GFR calc Af Amer: 33 mL/min/{1.73_m2} — ABNORMAL LOW (ref >59)
GFR calc non Af Amer: 29 mL/min/{1.73_m2} — ABNORMAL LOW (ref >59)
Glucose: 173 mg/dL — ABNORMAL HIGH (ref 65–99)
Potassium: 4.9 mmol/L (ref 3.5–5.2)
Sodium: 141 mmol/L (ref 134–144)

## 2018-10-05 ENCOUNTER — Other Ambulatory Visit: Payer: Self-pay | Admitting: *Deleted

## 2018-10-05 DIAGNOSIS — I272 Pulmonary hypertension, unspecified: Secondary | ICD-10-CM

## 2018-10-06 ENCOUNTER — Ambulatory Visit
Admission: RE | Admit: 2018-10-06 | Discharge: 2018-10-06 | Disposition: A | Discharge status: Home / Self Care | Payer: Medicare HMO | Source: Ambulatory Visit | Attending: Cardiology | Admitting: Cardiology

## 2018-10-06 ENCOUNTER — Other Ambulatory Visit: Payer: Self-pay

## 2018-10-06 DIAGNOSIS — J90 Pleural effusion, not elsewhere classified: Secondary | ICD-10-CM | POA: Diagnosis not present

## 2018-10-06 DIAGNOSIS — I480 Paroxysmal atrial fibrillation: Secondary | ICD-10-CM

## 2018-10-24 NOTE — Progress Notes (Signed)
[id number] - [place], [address] [place] [address] Phone: [phone] Fax: [phone]      Your procedure is scheduled on Thursday, October 27, 2018.  Report to [facility] Main Entrance "A" at 11:04 A.M., and check in at the Admitting office.   Call this number if you have problems the morning of surgery:  [phone]  Call [phone] if you have any questions prior to your surgery date Monday-Friday 8am-4pm    Remember:  Do not eat or drink after midnight the night before your surgery    Take these medicines the morning of surgery with A SIP OF WATER : Acetaminophen (Tylenol) - if needed Amiodarone Amlodipine (Norvasc) Atorvastatin (Lipitor) Hydrocodone-Acetaminophen (Norco/Vicodin) - if needed Levothyroxine (Synthroid) Metoprolol Succinate (Toprol-XL)   7 days prior to surgery STOP taking any Aspirin (unless otherwise instructed by your surgeon), Aleve, Naproxen, Ibuprofen, Motrin, Advil, Goody's, BC's, all herbal medications, fish oil, and all vitamins.    The Morning of Surgery  Do not wear jewelry, make-up or nail polish.  Do not wear lotions, powders, or perfumes/colognes, or deodorant  Do not shave 48 hours prior to surgery.  Men may shave face and neck.  Do not bring valuables to the hospital.  [facility] is not responsible for any belongings or valuables.  If you are a smoker, DO NOT Smoke 24 hours prior to surgery IF you wear a CPAP at night please bring your mask, tubing, and machine the morning of surgery   Remember that you must have someone to transport you home after your surgery, and remain with you for 24 hours if you are discharged the same day.   Contacts, glasses, hearing aids, dentures or bridgework may not be worn into surgery.    Leave your suitcase in the car.  After surgery it may be brought to your room.  For patients admitted to the hospital, discharge time will be determined by  your treatment team.  Patients discharged the day of surgery will not be allowed to drive home.    Special instructions:   [facility]- Preparing For Surgery  Before surgery, you can play an important role. Because skin is not sterile, your skin needs to be as free of germs as possible. You can reduce the number of germs on your skin by washing with CHG (chlorahexidine gluconate) Soap before surgery.  CHG is an antiseptic cleaner which kills germs and bonds with the skin to continue killing germs even after washing.    Oral Hygiene is also important to reduce your risk of infection.  Remember - BRUSH YOUR TEETH THE MORNING OF SURGERY WITH YOUR REGULAR TOOTHPASTE  Please do not use if you have an allergy to CHG or antibacterial soaps. If your skin becomes reddened/irritated stop using the CHG.  Do not shave (including legs and underarms) for at least 48 hours prior to first CHG shower. It is OK to shave your face.  Please follow these instructions carefully.   1. Shower the NIGHT BEFORE SURGERY and the MORNING OF SURGERY with CHG Soap.   2. If you chose to wash your hair, wash your hair first as usual with your normal shampoo.  3. After you shampoo, rinse your hair and body thoroughly to remove the shampoo.  4. Use CHG as you would any other liquid soap. You can apply CHG directly to the skin and wash gently with a scrungie or a clean washcloth.   5. Apply  the CHG Soap to your body ONLY FROM THE NECK DOWN.  Do not use on open wounds or open sores. Avoid contact with your eyes, ears, mouth and genitals (private parts). Wash Face and genitals (private parts)  with your normal soap.   6. Wash thoroughly, paying special attention to the area where your surgery will be performed.  7. Thoroughly rinse your body with warm water from the neck down.  8. DO NOT shower/wash with your normal soap after using and rinsing off the CHG Soap.  9. Pat yourself dry with a CLEAN TOWEL.  10. Wear CLEAN  PAJAMAS to bed the night before surgery, wear comfortable clothes the morning of surgery  11. Place CLEAN SHEETS on your bed the night of your first shower and DO NOT SLEEP WITH PETS.    Day of Surgery:  Do not apply any deodorants/lotions. Please shower the morning of surgery with the CHG soap  Please wear clean clothes to the hospital/surgery center.   Remember to brush your teeth WITH YOUR REGULAR TOOTHPASTE.   Please read over the following fact sheets that you were given.

## 2018-10-25 ENCOUNTER — Other Ambulatory Visit (HOSPITAL_COMMUNITY)
Admission: RE | Admit: 2018-10-25 | Discharge: 2018-10-25 | Disposition: A | Discharge status: Home / Self Care | Payer: Medicare HMO | Source: Ambulatory Visit | Attending: Neurosurgery | Admitting: Neurosurgery

## 2018-10-25 ENCOUNTER — Encounter (HOSPITAL_COMMUNITY)
Admission: RE | Admit: 2018-10-25 | Discharge: 2018-10-25 | Disposition: A | Discharge status: Home / Self Care | Payer: Medicare HMO | Source: Ambulatory Visit | Attending: Neurosurgery | Admitting: Neurosurgery

## 2018-10-25 ENCOUNTER — Other Ambulatory Visit: Payer: Self-pay

## 2018-10-25 ENCOUNTER — Encounter (HOSPITAL_COMMUNITY): Payer: Self-pay

## 2018-10-25 DIAGNOSIS — Z887 Allergy status to serum and vaccine status: Secondary | ICD-10-CM | POA: Diagnosis not present

## 2018-10-25 DIAGNOSIS — Q211 Atrial septal defect: Secondary | ICD-10-CM | POA: Diagnosis not present

## 2018-10-25 DIAGNOSIS — Z79890 Hormone replacement therapy (postmenopausal): Secondary | ICD-10-CM | POA: Diagnosis not present

## 2018-10-25 DIAGNOSIS — M5116 Intervertebral disc disorders with radiculopathy, lumbar region: Secondary | ICD-10-CM | POA: Diagnosis present

## 2018-10-25 DIAGNOSIS — Z79899 Other long term (current) drug therapy: Secondary | ICD-10-CM | POA: Diagnosis not present

## 2018-10-25 DIAGNOSIS — M5416 Radiculopathy, lumbar region: Secondary | ICD-10-CM | POA: Diagnosis not present

## 2018-10-25 DIAGNOSIS — Z7901 Long term (current) use of anticoagulants: Secondary | ICD-10-CM | POA: Diagnosis not present

## 2018-10-25 DIAGNOSIS — I480 Paroxysmal atrial fibrillation: Secondary | ICD-10-CM | POA: Diagnosis present

## 2018-10-25 DIAGNOSIS — Z1159 Encounter for screening for other viral diseases: Secondary | ICD-10-CM | POA: Diagnosis not present

## 2018-10-25 DIAGNOSIS — Z20828 Contact with and (suspected) exposure to other viral communicable diseases: Secondary | ICD-10-CM | POA: Diagnosis present

## 2018-10-25 DIAGNOSIS — M4807 Spinal stenosis, lumbosacral region: Secondary | ICD-10-CM | POA: Diagnosis present

## 2018-10-25 DIAGNOSIS — M4327 Fusion of spine, lumbosacral region: Secondary | ICD-10-CM | POA: Diagnosis not present

## 2018-10-25 DIAGNOSIS — I10 Essential (primary) hypertension: Secondary | ICD-10-CM | POA: Diagnosis present

## 2018-10-25 DIAGNOSIS — M4317 Spondylolisthesis, lumbosacral region: Secondary | ICD-10-CM | POA: Diagnosis not present

## 2018-10-25 DIAGNOSIS — I272 Pulmonary hypertension, unspecified: Secondary | ICD-10-CM | POA: Diagnosis not present

## 2018-10-25 DIAGNOSIS — M5117 Intervertebral disc disorders with radiculopathy, lumbosacral region: Secondary | ICD-10-CM | POA: Diagnosis not present

## 2018-10-25 DIAGNOSIS — I4891 Unspecified atrial fibrillation: Secondary | ICD-10-CM | POA: Diagnosis not present

## 2018-10-25 DIAGNOSIS — M4316 Spondylolisthesis, lumbar region: Secondary | ICD-10-CM | POA: Diagnosis present

## 2018-10-25 HISTORY — DX: Cardiac arrhythmia, unspecified: I49.9

## 2018-10-25 HISTORY — DX: Localized edema: R60.0

## 2018-10-25 HISTORY — DX: Unspecified osteoarthritis, unspecified site: M19.90

## 2018-10-25 LAB — BASIC METABOLIC PANEL
Anion gap: 12 (ref 5–15)
BUN: 30 mg/dL — ABNORMAL HIGH (ref 8–23)
CO2: 22 mmol/L (ref 22–32)
Calcium: 9.5 mg/dL (ref 8.9–10.3)
Chloride: 106 mmol/L (ref 98–111)
Creatinine, Ser: 1.61 mg/dL — ABNORMAL HIGH (ref 0.44–1.00)
GFR calc Af Amer: 33 mL/min — ABNORMAL LOW (ref >60)
GFR calc non Af Amer: 29 mL/min — ABNORMAL LOW (ref >60)
Glucose, Bld: 104 mg/dL — ABNORMAL HIGH (ref 70–99)
Potassium: 4.7 mmol/L (ref 3.5–5.1)
Sodium: 140 mmol/L (ref 135–145)

## 2018-10-25 LAB — PROTIME-INR
INR: 1.1 (ref 0.8–1.2)
Prothrombin Time: 13.8 seconds (ref 11.4–15.2)

## 2018-10-25 LAB — CBC
HCT: 42.9 % (ref 36.0–46.0)
Hemoglobin: 13.8 g/dL (ref 12.0–15.0)
MCH: 31.9 pg (ref 26.0–34.0)
MCHC: 32.2 g/dL (ref 30.0–36.0)
MCV: 99.3 fL (ref 80.0–100.0)
Platelets: 289 10*3/uL (ref 150–400)
RBC: 4.32 MIL/uL (ref 3.87–5.11)
RDW: 15.5 % (ref 11.5–15.5)
WBC: 8.5 10*3/uL (ref 4.0–10.5)
nRBC: 0 % (ref 0.0–0.2)

## 2018-10-25 LAB — ABO/RH: ABO/RH(D): A POS

## 2018-10-25 LAB — SURGICAL PCR SCREEN
MRSA, PCR: NEGATIVE
Staphylococcus aureus: NEGATIVE

## 2018-10-25 LAB — TYPE AND SCREEN
ABO/RH(D): A POS
Antibody Screen: NEGATIVE

## 2018-10-25 LAB — SARS CORONAVIRUS 2 (TAT 6-24 HRS): SARS Coronavirus 2: NEGATIVE

## 2018-10-25 MED ORDER — CHLORHEXIDINE GLUCONATE CLOTH 2 % EX PADS: 6.0000 | MEDICATED_PAD | Freq: Once | CUTANEOUS | Status: DC

## 2018-10-25 NOTE — H&P (Signed)
Patient ID:   [id number] Patient: Tammy [name]  Date of Birth: 10/09/1931 Visit Type: Office Visit   Date: 09/14/2018 11:45 AM Provider: Marchia Meiers. Vertell Limber MD   This 85 year old female presents for leg pain f/u.  HISTORY OF PRESENT ILLNESS:  1.  leg pain f/u  The patient comes in today and is complaining of severe pain in her left leg.  She has L5 radiculopathy on examination today.  She rates her pain as a out of 10. She has weakness in dorsiflexion on the left and EHL.  Lumbar imaging demonstrates severe stenosis with foraminal stenosis at the L5-S1 level on the left.  He wants to get some relief for this.  I have recommended left L5-S1 TLIF surgery.  She is on blood thinners and is going to see Dr. Stanford Breed regarding this for cardiac clearance.  On clearance being given we will proceed with scheduling surgery.         Medical/Surgical/Interim History Reviewed, no change.     PAST MEDICAL HISTORY, SURGICAL HISTORY, FAMILY HISTORY, SOCIAL HISTORY AND REVIEW OF SYSTEMS I have reviewed the patient's past medical, surgical, family and social history as well as the comprehensive review of systems as included on the [facility] history form dated 08/24/2018, which I have signed.  Family History:  Reviewed, no changes.    Social History: Reviewed, no changes.   MEDICATIONS: (added, continued or stopped this visit) Started Medication Directions Instruction Stopped   amiodarone 200 mg tablet take 1 tablet by oral route  every day     amlodipine 10 mg tablet take 1 tablet by oral route  every day     atorvastatin 20 mg tablet take 1 tablet by oral route  every day     Eliquis 2.5 mg tablet take 1 tablet by oral route 2 times every day     furosemide 20 mg tablet take 1 tablet by oral route  every day    09/14/2018 hydrocodone 5 mg-acetaminophen 325 mg tablet take 1 tablet by oral route  every 6 hours as needed for pain     metoprolol tartrate 25 mg tablet  take 1 tablet by oral route 2 times every day     Tirosint 25 mcg capsule take 1 capsule by oral route  every day       ALLERGIES: Ingredient Reaction Medication Name Comment  TUBERCULIN, PURIFIED PROTEIN DERIVATIVE Rash        PHYSICAL EXAM:   Vitals Date Temp F BP Pulse Ht In Wt Lb BMI BSA Pain Score  09/14/2018 97.1 146/73 75 66 173 27.92  8/10      IMPRESSION:   Severe stenosis with herniated disc and L5 radiculopathy due to foraminal stenosis at the L5-S1 level.  I have recommended L5-S1 TLIF surgery.  This will be performed once she has been given cardiac clearance.  Patient is on an anti-coagulant, anti-inflammatory or supplement that may increase bleeding time. Patient advised to stop medicine prior to surgery.  Comments:  Will need to stop blood thinner prior to surgery if approved by Dr. Demaris Callander  PLAN:  Proceed with surgery on receiving cardiac clearance.  Orders: Instruction(s)/Education: Assessment Instruction  I10 Hypertension education  Z68.27 Lifestyle education regarding diet  Miscellaneous: Assessment   M54.16 LSO Brace   Completed Orders (this encounter) Order Details Reason Side Interpretation Result Initial Treatment Date Region  Hypertension education Patient to follow up with primary care provider  Lifestyle education regarding diet Patient encouraged to eat a well balance diet         Assessment/Plan   # Detail Type Description   1. Assessment Low back pain, unspecified back pain laterality, with sciatica presence unspecified (M54.5).       2. Assessment Disc displacement, lumbar (M51.26).       3. Assessment Spondylolisthesis, lumbar region (M43.16).       4. Assessment Radiculopathy, lumbar region (M54.16).   Plan Orders LSO Brace. Clinical information/comments: Please mail Rx to patient.       5. Assessment Essential (primary) hypertension (I10).       6. Assessment Body mass index (BMI) 27.0-27.9, adult [id number]).   Plan  Orders Today's instructions / counseling include(s) Lifestyle education regarding diet. Clinical information/comments: Patient encouraged to eat a well balance diet.         Pain Management Plan Pain Scale: 8/10. Method: Numeric Pain Intensity Scale. Location: back. Onset: 09/23/2017. Duration: varies. Quality: discomforting. Pain management follow-up plan of care: Patient taking medication as prescribed.  Fall Risk Plan The patient has not fallen in the last year. Falls risk follow-up plan of care: Balance, strength, and gait training: Advise to use safety measures when necessary.     MEDICATIONS PRESCRIBED TODAY    Rx Quantity Refills  HYDROCODONE-ACETAMINOPHEN 5 mg-325 mg  30 0            Provider:  Marchia Meiers. Vertell Limber MD  09/18/2018 04:41 PM    Dictation edited by: Marchia Meiers. Vertell Limber    CC Providers: Vinnie Level Nickel  [address] [facility] [place], [place] [address]-               Electronically signed by Marchia Meiers Vertell Limber MD on 09/18/2018 04:41 PM

## 2018-10-25 NOTE — Progress Notes (Signed)
PCP - [name] Cardiologist - DR CRENSHAW  -     Chest x-ray - 9/20 EKG - 8/20    Stress Test - 2/16 ECHO - 9/20      Blood Thinner Instructions: STOPPED ELIQUUIS 10/23/18 Aspirin Instructions:STOP  l -  COVID TEST- TODAY  Anesthesia review: CARDI AC   REVIEW, CLEARANCE DONE  Patient denies shortness of breath, fever, cough and chest pain at PAT appointment   Patient verbalized understanding of instructions that were given to them at the PAT appointment. Patient was also instructed that they will need to review over the PAT instructions again at home before surgery.

## 2018-10-26 NOTE — Anesthesia Preprocedure Evaluation (Addendum)
Anesthesia Evaluation  Patient identified by MRN, date of birth, ID band Patient awake    Reviewed: Allergy & Precautions, NPO status , Patient's Chart, lab work & pertinent test results  History of Anesthesia Complications Negative for: history of anesthetic complications  Airway Mallampati: I  TM Distance: >3 FB Neck ROM: Full    Dental  (+) Edentulous Upper, Missing, Dental Advisory Given   Pulmonary  10/25/2018 SARS coronavirus NEG   breath sounds clear to auscultation       Cardiovascular hypertension, Pt. on medications and Pt. on home beta blockers + dysrhythmias Atrial Fibrillation + Valvular Problems/Murmurs (large PFO/ASD, shunt predom L to R )  Rhythm:Regular Rate:Normal  09/26/2018 ECHO: EF 50-55%, severely reduced RV function, PFO/ASD by color doppler, severe TR  09/30/2018 TTE:  1. The left ventricle has low normal systolic function, with an ejection fraction of 50-55%. The cavity size was normal. Left ventricular diastolic parameters were normal. There is right ventricular volume overload.  2. The right ventricle has normal systolc function. The cavity was severely enlarged. There is no increase in right ventricular wall thickness. Right ventricular systolic pressure is severely elevated with an estimated pressure of 64.2 mmHg.  3. Left atrial size was mildly dilated.  4. Right atrial size was severely dilated.  5. Tricuspid valve regurgitation is severe.  6. There is severe tricuspid annular dilation with leaflet malcoaptation.  7. Aortic valve regurgitation is trivial by color flow Doppler.  8. The aortic root and ascending aorta are normal in size and structure.  9. Moderate to large secundum atrial septal defect with bidirectional shunting across atrial septum. Imaging is not optimal but the defect diameter is approximately 12 mm. 10. There is predominantly left-to-right shunt, but there is severe right-to-left  shunting during strain/cough.   Neuro/Psych negative neurological ROS     GI/Hepatic negative GI ROS, Neg liver ROS,   Endo/Other  negative endocrine ROS  Renal/GU Renal InsufficiencyRenal disease     Musculoskeletal   Abdominal   Peds  Hematology Eliquis: last dose sunday   Anesthesia Other Findings   Reproductive/Obstetrics                            Anesthesia Physical Anesthesia Plan  ASA: III  Anesthesia Plan: General   Post-op Pain Management:    Induction: Intravenous  PONV Risk Score and Plan: 4 or greater and Dexamethasone, Ondansetron and Treatment may vary due to age or medical condition  Airway Management Planned: Oral ETT  Additional Equipment:   Intra-op Plan:   Post-operative Plan: Extubation in OR  Informed Consent: I have reviewed the patients History and Physical, chart, labs and discussed the procedure including the risks, benefits and alternatives for the proposed anesthesia with the patient or authorized representative who has indicated his/her understanding and acceptance.     Dental advisory given  Plan Discussed with: CRNA and Surgeon  Anesthesia Plan Comments: (Follows with cardiology for hx of syncope (felt secondary to autonomic dysfunction), PE on chronic anticoagulation, paroxysmal afib, chronic DOE, chronic pedal edema, severe tricuspid regurgitation, moderate to large secundum atrial septal defect with bidirectional shunting.  Seen and cleared by Dr. Stanford Breed 09/26/18 "preoperative evaluation prior to back surgery-patient is not having chest pain.  We will hold apixaban 3 days prior to procedure and resume after when okay with surgery."  Hx of CKD with baseline creatinine ~1.60.  TTE 09/30/18:  1. The left ventricle has low normal systolic  function, with an ejection fraction of 50-55%. The cavity size was normal. Left ventricular diastolic parameters were normal. There is right ventricular volume  overload.  2. The right ventricle has normal systolc function. The cavity was severely enlarged. There is no increase in right ventricular wall thickness. Right ventricular systolic pressure is severely elevated with an estimated pressure of 64.2 mmHg.  3. Left atrial size was mildly dilated.  4. Right atrial size was severely dilated.  5. Tricuspid valve regurgitation is severe.  6. There is severe tricuspid annular dilation with leaflet malcoaptation.  7. Aortic valve regurgitation is trivial by color flow Doppler.  8. The aortic root and ascending aorta are normal in size and structure.  9. Moderate to large secundum atrial septal defect with bidirectional shunting across atrial septum. Imaging is not optimal but the defect diameter is approximately 12 mm. 10. There is predominantly left-to-right shunt, but there is severe right-to-left shunting during strain/cough. This is seen both by color Doppler and saline contrast injection. Shunt fraction cannot be calculated due to technically poor images of the  RVoutflow and pulmonary artery.)       Anesthesia Quick Evaluation

## 2018-10-27 ENCOUNTER — Inpatient Hospital Stay (HOSPITAL_COMMUNITY)
Admission: RE | Admit: 2018-10-27 | Discharge: 2018-10-28 | DRG: 454 | Disposition: A | Discharge status: Home / Self Care | Payer: Medicare HMO | Attending: Neurosurgery | Admitting: Neurosurgery

## 2018-10-27 ENCOUNTER — Encounter (HOSPITAL_COMMUNITY)
Admission: RE | Disposition: A | Discharge status: Home / Self Care | Payer: Self-pay | Source: Home / Self Care | Attending: Neurosurgery

## 2018-10-27 ENCOUNTER — Inpatient Hospital Stay (HOSPITAL_COMMUNITY): Payer: Medicare HMO

## 2018-10-27 ENCOUNTER — Other Ambulatory Visit: Payer: Self-pay

## 2018-10-27 ENCOUNTER — Encounter (HOSPITAL_COMMUNITY): Payer: Self-pay | Admitting: Anesthesiology

## 2018-10-27 ENCOUNTER — Inpatient Hospital Stay (HOSPITAL_COMMUNITY): Payer: Medicare HMO | Admitting: Physician Assistant

## 2018-10-27 ENCOUNTER — Inpatient Hospital Stay (HOSPITAL_COMMUNITY): Payer: Medicare HMO | Admitting: Anesthesiology

## 2018-10-27 DIAGNOSIS — Z7901 Long term (current) use of anticoagulants: Secondary | ICD-10-CM | POA: Diagnosis not present

## 2018-10-27 DIAGNOSIS — I4891 Unspecified atrial fibrillation: Secondary | ICD-10-CM | POA: Diagnosis not present

## 2018-10-27 DIAGNOSIS — M5416 Radiculopathy, lumbar region: Secondary | ICD-10-CM | POA: Diagnosis not present

## 2018-10-27 DIAGNOSIS — I272 Pulmonary hypertension, unspecified: Secondary | ICD-10-CM | POA: Diagnosis not present

## 2018-10-27 DIAGNOSIS — Z1159 Encounter for screening for other viral diseases: Secondary | ICD-10-CM | POA: Diagnosis not present

## 2018-10-27 DIAGNOSIS — Z887 Allergy status to serum and vaccine status: Secondary | ICD-10-CM

## 2018-10-27 DIAGNOSIS — M4327 Fusion of spine, lumbosacral region: Secondary | ICD-10-CM | POA: Diagnosis not present

## 2018-10-27 DIAGNOSIS — Z20828 Contact with and (suspected) exposure to other viral communicable diseases: Secondary | ICD-10-CM | POA: Diagnosis present

## 2018-10-27 DIAGNOSIS — I480 Paroxysmal atrial fibrillation: Secondary | ICD-10-CM | POA: Diagnosis present

## 2018-10-27 DIAGNOSIS — M4807 Spinal stenosis, lumbosacral region: Secondary | ICD-10-CM | POA: Diagnosis present

## 2018-10-27 DIAGNOSIS — M5116 Intervertebral disc disorders with radiculopathy, lumbar region: Secondary | ICD-10-CM | POA: Diagnosis present

## 2018-10-27 DIAGNOSIS — Z79890 Hormone replacement therapy (postmenopausal): Secondary | ICD-10-CM | POA: Diagnosis not present

## 2018-10-27 DIAGNOSIS — Q211 Atrial septal defect: Secondary | ICD-10-CM

## 2018-10-27 DIAGNOSIS — Z79899 Other long term (current) drug therapy: Secondary | ICD-10-CM | POA: Diagnosis not present

## 2018-10-27 DIAGNOSIS — I10 Essential (primary) hypertension: Secondary | ICD-10-CM | POA: Diagnosis present

## 2018-10-27 DIAGNOSIS — Z419 Encounter for procedure for purposes other than remedying health state, unspecified: Secondary | ICD-10-CM

## 2018-10-27 DIAGNOSIS — M4316 Spondylolisthesis, lumbar region: Secondary | ICD-10-CM | POA: Diagnosis present

## 2018-10-27 HISTORY — PX: TRANSFORAMINAL LUMBAR INTERBODY FUSION (TLIF) WITH PEDICLE SCREW FIXATION 1 LEVEL: SHX6141

## 2018-10-27 SURGERY — TRANSFORAMINAL LUMBAR INTERBODY FUSION (TLIF) WITH PEDICLE SCREW FIXATION 1 LEVEL
Anesthesia: General | Site: Back | Laterality: Left

## 2018-10-27 MED ORDER — 0.9 % SODIUM CHLORIDE (POUR BTL) OPTIME
TOPICAL | Status: DC | PRN
  Administered 2018-10-27: 1000 mL

## 2018-10-27 MED ORDER — FENTANYL CITRATE (PF) 250 MCG/5ML IJ SOLN
INTRAMUSCULAR | Status: DC | PRN
  Administered 2018-10-27: 100 ug via INTRAVENOUS
  Administered 2018-10-27 (×2): 50 ug via INTRAVENOUS

## 2018-10-27 MED ORDER — METOPROLOL SUCCINATE ER 25 MG PO TB24: 25.0000 mg | ORAL_TABLET | Freq: Every day | ORAL | Status: DC

## 2018-10-27 MED ORDER — BUPIVACAINE HCL (PF) 0.5 % IJ SOLN
INTRAMUSCULAR | Status: DC | PRN
  Administered 2018-10-27: 10 mL

## 2018-10-27 MED ORDER — THROMBIN 5000 UNITS EX SOLR
OROMUCOSAL | Status: DC | PRN
  Administered 2018-10-27: 5 mL via TOPICAL

## 2018-10-27 MED ORDER — HYDROCODONE-ACETAMINOPHEN 5-325 MG PO TABS
2.0000 | ORAL_TABLET | ORAL | Status: DC | PRN
  Administered 2018-10-27: 1 via ORAL
  Filled 2018-10-27: qty 2

## 2018-10-27 MED ORDER — ACETAMINOPHEN 10 MG/ML IV SOLN
INTRAVENOUS | Status: AC
  Filled 2018-10-27: qty 100

## 2018-10-27 MED ORDER — BUPIVACAINE LIPOSOME 1.3 % IJ SUSP
20.0000 mL | Freq: Once | INTRAMUSCULAR | Status: DC
  Filled 2018-10-27: qty 20

## 2018-10-27 MED ORDER — PROPOFOL 10 MG/ML IV BOLUS
INTRAVENOUS | Status: DC | PRN
  Administered 2018-10-27: 100 mg via INTRAVENOUS

## 2018-10-27 MED ORDER — ONDANSETRON HCL 4 MG PO TABS: 4.0000 mg | ORAL_TABLET | Freq: Four times a day (QID) | ORAL | Status: DC | PRN

## 2018-10-27 MED ORDER — LIDOCAINE-EPINEPHRINE 1 %-1:100000 IJ SOLN
INTRAMUSCULAR | Status: AC
  Filled 2018-10-27: qty 1

## 2018-10-27 MED ORDER — ATORVASTATIN CALCIUM 10 MG PO TABS
20.0000 mg | ORAL_TABLET | Freq: Every day | ORAL | Status: DC
  Administered 2018-10-27: 20 mg via ORAL
  Filled 2018-10-27: qty 2

## 2018-10-27 MED ORDER — HYDROCODONE-ACETAMINOPHEN 5-325 MG PO TABS: 1.0000 | ORAL_TABLET | Freq: Four times a day (QID) | ORAL | Status: DC | PRN

## 2018-10-27 MED ORDER — LIDOCAINE-EPINEPHRINE 1 %-1:100000 IJ SOLN
INTRAMUSCULAR | Status: DC | PRN
  Administered 2018-10-27: 10 mL

## 2018-10-27 MED ORDER — ROCURONIUM BROMIDE 10 MG/ML (PF) SYRINGE
PREFILLED_SYRINGE | INTRAVENOUS | Status: DC | PRN
  Administered 2018-10-27: 60 mg via INTRAVENOUS

## 2018-10-27 MED ORDER — BUPIVACAINE HCL (PF) 0.5 % IJ SOLN
INTRAMUSCULAR | Status: AC
  Filled 2018-10-27: qty 30

## 2018-10-27 MED ORDER — PROPOFOL 10 MG/ML IV BOLUS
INTRAVENOUS | Status: AC
  Filled 2018-10-27: qty 20

## 2018-10-27 MED ORDER — ACETAMINOPHEN 500 MG PO TABS: 1000.0000 mg | ORAL_TABLET | Freq: Three times a day (TID) | ORAL | Status: DC | PRN

## 2018-10-27 MED ORDER — ACETAMINOPHEN 650 MG RE SUPP: 650.0000 mg | RECTAL | Status: DC | PRN

## 2018-10-27 MED ORDER — SODIUM CHLORIDE 0.9% FLUSH: 3.0000 mL | Freq: Two times a day (BID) | INTRAVENOUS | Status: DC

## 2018-10-27 MED ORDER — PHENOL 1.4 % MT LIQD: 1.0000 | OROMUCOSAL | Status: DC | PRN

## 2018-10-27 MED ORDER — ONDANSETRON HCL 4 MG/2ML IJ SOLN
INTRAMUSCULAR | Status: DC | PRN
  Administered 2018-10-27: 4 mg via INTRAVENOUS

## 2018-10-27 MED ORDER — FUROSEMIDE 20 MG PO TABS: 20.0000 mg | ORAL_TABLET | Freq: Every day | ORAL | Status: DC

## 2018-10-27 MED ORDER — PANTOPRAZOLE SODIUM 40 MG PO TBEC
40.0000 mg | DELAYED_RELEASE_TABLET | Freq: Every day | ORAL | Status: DC
  Administered 2018-10-27: 40 mg via ORAL
  Filled 2018-10-27: qty 1

## 2018-10-27 MED ORDER — PROMETHAZINE HCL 25 MG/ML IJ SOLN: 6.2500 mg | INTRAMUSCULAR | Status: DC | PRN

## 2018-10-27 MED ORDER — BUPIVACAINE LIPOSOME 1.3 % IJ SUSP
INTRAMUSCULAR | Status: DC | PRN
  Administered 2018-10-27: 20 mL

## 2018-10-27 MED ORDER — EPHEDRINE SULFATE-NACL 50-0.9 MG/10ML-% IV SOSY
PREFILLED_SYRINGE | INTRAVENOUS | Status: DC | PRN
  Administered 2018-10-27 (×4): 5 mg via INTRAVENOUS

## 2018-10-27 MED ORDER — APIXABAN 2.5 MG PO TABS: 2.5000 mg | ORAL_TABLET | Freq: Two times a day (BID) | ORAL | Status: DC

## 2018-10-27 MED ORDER — ONDANSETRON HCL 4 MG/2ML IJ SOLN: 4.0000 mg | Freq: Four times a day (QID) | INTRAMUSCULAR | Status: DC | PRN

## 2018-10-27 MED ORDER — MENTHOL 3 MG MT LOZG
1.0000 | LOZENGE | OROMUCOSAL | Status: DC | PRN
  Filled 2018-10-27: qty 9

## 2018-10-27 MED ORDER — LEVOTHYROXINE SODIUM 25 MCG PO TABS
25.0000 ug | ORAL_TABLET | Freq: Every day | ORAL | Status: DC
  Administered 2018-10-28: 25 ug via ORAL
  Filled 2018-10-27: qty 1

## 2018-10-27 MED ORDER — THROMBIN 5000 UNITS EX SOLR
CUTANEOUS | Status: AC
  Filled 2018-10-27: qty 5000

## 2018-10-27 MED ORDER — ZOLPIDEM TARTRATE 5 MG PO TABS: 5.0000 mg | ORAL_TABLET | Freq: Every evening | ORAL | Status: DC | PRN

## 2018-10-27 MED ORDER — KCL IN DEXTROSE-NACL 20-5-0.45 MEQ/L-%-% IV SOLN: INTRAVENOUS | Status: DC

## 2018-10-27 MED ORDER — BISACODYL 10 MG RE SUPP: 10.0000 mg | Freq: Every day | RECTAL | Status: DC | PRN

## 2018-10-27 MED ORDER — LACTATED RINGERS IV SOLN
INTRAVENOUS | Status: DC
  Administered 2018-10-27 (×2): via INTRAVENOUS

## 2018-10-27 MED ORDER — AMLODIPINE BESYLATE 5 MG PO TABS: 10.0000 mg | ORAL_TABLET | Freq: Every day | ORAL | Status: DC

## 2018-10-27 MED ORDER — SODIUM CHLORIDE 0.9 % IV SOLN: 250.0000 mL | INTRAVENOUS | Status: DC

## 2018-10-27 MED ORDER — METHOCARBAMOL 1000 MG/10ML IJ SOLN
500.0000 mg | Freq: Four times a day (QID) | INTRAVENOUS | Status: DC | PRN
  Filled 2018-10-27: qty 5

## 2018-10-27 MED ORDER — FENTANYL CITRATE (PF) 100 MCG/2ML IJ SOLN: 25.0000 ug | INTRAMUSCULAR | Status: DC | PRN

## 2018-10-27 MED ORDER — LIDOCAINE 2% (20 MG/ML) 5 ML SYRINGE
INTRAMUSCULAR | Status: DC | PRN
  Administered 2018-10-27: 50 mg via INTRAVENOUS

## 2018-10-27 MED ORDER — ACETAMINOPHEN 10 MG/ML IV SOLN
INTRAVENOUS | Status: DC | PRN
  Administered 2018-10-27: 1000 mg via INTRAVENOUS

## 2018-10-27 MED ORDER — DEXAMETHASONE SODIUM PHOSPHATE 10 MG/ML IJ SOLN
INTRAMUSCULAR | Status: DC | PRN
  Administered 2018-10-27: 4 mg via INTRAVENOUS

## 2018-10-27 MED ORDER — METHOCARBAMOL 500 MG PO TABS
500.0000 mg | ORAL_TABLET | Freq: Four times a day (QID) | ORAL | Status: DC | PRN
  Administered 2018-10-27 – 2018-10-28 (×2): 500 mg via ORAL
  Filled 2018-10-27 (×2): qty 1

## 2018-10-27 MED ORDER — CEFAZOLIN SODIUM-DEXTROSE 2-4 GM/100ML-% IV SOLN
2.0000 g | INTRAVENOUS | Status: AC
  Administered 2018-10-27: 2 g via INTRAVENOUS
  Filled 2018-10-27: qty 100

## 2018-10-27 MED ORDER — MORPHINE SULFATE (PF) 2 MG/ML IV SOLN: 2.0000 mg | INTRAVENOUS | Status: DC | PRN

## 2018-10-27 MED ORDER — ACETAMINOPHEN 325 MG PO TABS
650.0000 mg | ORAL_TABLET | ORAL | Status: DC | PRN
  Filled 2018-10-27: qty 2

## 2018-10-27 MED ORDER — OXYCODONE HCL 5 MG PO TABS: 5.0000 mg | ORAL_TABLET | ORAL | Status: DC | PRN

## 2018-10-27 MED ORDER — CEFAZOLIN SODIUM-DEXTROSE 2-4 GM/100ML-% IV SOLN
2.0000 g | Freq: Three times a day (TID) | INTRAVENOUS | Status: AC
  Administered 2018-10-27 – 2018-10-28 (×2): 2 g via INTRAVENOUS
  Filled 2018-10-27 (×2): qty 100

## 2018-10-27 MED ORDER — FLEET ENEMA 7-19 GM/118ML RE ENEM: 1.0000 | ENEMA | Freq: Once | RECTAL | Status: DC | PRN

## 2018-10-27 MED ORDER — SODIUM CHLORIDE 0.9 % IV SOLN
INTRAVENOUS | Status: DC | PRN
  Administered 2018-10-27 (×2): 10 ug/min via INTRAVENOUS

## 2018-10-27 MED ORDER — FENTANYL CITRATE (PF) 250 MCG/5ML IJ SOLN
INTRAMUSCULAR | Status: AC
  Filled 2018-10-27: qty 5

## 2018-10-27 MED ORDER — DOCUSATE SODIUM 100 MG PO CAPS
100.0000 mg | ORAL_CAPSULE | Freq: Two times a day (BID) | ORAL | Status: DC
  Administered 2018-10-27: 100 mg via ORAL
  Filled 2018-10-27: qty 1

## 2018-10-27 MED ORDER — ALUM & MAG HYDROXIDE-SIMETH 200-200-20 MG/5ML PO SUSP: 30.0000 mL | Freq: Four times a day (QID) | ORAL | Status: DC | PRN

## 2018-10-27 MED ORDER — ROCURONIUM BROMIDE 10 MG/ML (PF) SYRINGE
PREFILLED_SYRINGE | INTRAVENOUS | Status: AC
  Filled 2018-10-27: qty 10

## 2018-10-27 MED ORDER — LIDOCAINE 2% (20 MG/ML) 5 ML SYRINGE
INTRAMUSCULAR | Status: AC
  Filled 2018-10-27: qty 5

## 2018-10-27 MED ORDER — PANTOPRAZOLE SODIUM 40 MG IV SOLR: 40.0000 mg | Freq: Every day | INTRAVENOUS | Status: DC

## 2018-10-27 MED ORDER — SODIUM CHLORIDE 0.9% FLUSH: 3.0000 mL | INTRAVENOUS | Status: DC | PRN

## 2018-10-27 MED ORDER — THROMBIN 20000 UNITS EX SOLR
CUTANEOUS | Status: AC
  Filled 2018-10-27: qty 20000

## 2018-10-27 MED ORDER — MIDAZOLAM HCL 2 MG/2ML IJ SOLN: 0.5000 mg | Freq: Once | INTRAMUSCULAR | Status: DC | PRN

## 2018-10-27 MED ORDER — AMIODARONE HCL 200 MG PO TABS
200.0000 mg | ORAL_TABLET | Freq: Every day | ORAL | Status: DC
  Filled 2018-10-27: qty 1

## 2018-10-27 MED ORDER — SUGAMMADEX SODIUM 200 MG/2ML IV SOLN
INTRAVENOUS | Status: DC | PRN
  Administered 2018-10-27: 159.6 mg via INTRAVENOUS

## 2018-10-27 MED ORDER — DEXAMETHASONE SODIUM PHOSPHATE 10 MG/ML IJ SOLN
INTRAMUSCULAR | Status: AC
  Filled 2018-10-27: qty 1

## 2018-10-27 MED ORDER — POLYETHYLENE GLYCOL 3350 17 G PO PACK: 17.0000 g | PACK | Freq: Every day | ORAL | Status: DC | PRN

## 2018-10-27 MED ORDER — ONDANSETRON HCL 4 MG/2ML IJ SOLN
INTRAMUSCULAR | Status: AC
  Filled 2018-10-27: qty 2

## 2018-10-27 SURGICAL SUPPLY — 83 items
ADH SKN CLS APL DERMABOND .7 (GAUZE/BANDAGES/DRESSINGS) ×1
BASKET BONE COLLECTION (BASKET) ×3 IMPLANT
BLADE CLIPPER SURG (BLADE) IMPLANT
BONE CHIP PRESERV 40CC PCAN1/2 (Bone Implant) ×3 IMPLANT
BUR MATCHSTICK NEURO 3.0 LAGG (BURR) ×3 IMPLANT
BUR PRECISION FLUTE 5.0 (BURR) ×3 IMPLANT
CANISTER SUCT 3000ML PPV (MISCELLANEOUS) ×3 IMPLANT
CARTRIDGE OIL MAESTRO DRILL (MISCELLANEOUS) ×1 IMPLANT
CONT SPEC 4OZ CLIKSEAL STRL BL (MISCELLANEOUS) ×3 IMPLANT
COVER BACK TABLE 24X17X13 BIG (DRAPES) IMPLANT
COVER BACK TABLE 60X90IN (DRAPES) ×3 IMPLANT
COVER WAND RF STERILE (DRAPES) ×3 IMPLANT
DECANTER SPIKE VIAL GLASS SM (MISCELLANEOUS) ×3 IMPLANT
DERMABOND ADVANCED (GAUZE/BANDAGES/DRESSINGS) ×2
DERMABOND ADVANCED .7 DNX12 (GAUZE/BANDAGES/DRESSINGS) ×1 IMPLANT
DIFFUSER DRILL AIR PNEUMATIC (MISCELLANEOUS) ×3 IMPLANT
DRAPE C-ARM 42X72 X-RAY (DRAPES) ×3 IMPLANT
DRAPE C-ARMOR (DRAPES) ×3 IMPLANT
DRAPE LAPAROTOMY 100X72X124 (DRAPES) ×3 IMPLANT
DRAPE POUCH INSTRU U-SHP 10X18 (DRAPES) ×3 IMPLANT
DRAPE SURG 17X23 STRL (DRAPES) ×3 IMPLANT
DRSG OPSITE POSTOP 3X4 (GAUZE/BANDAGES/DRESSINGS) ×2 IMPLANT
DURAPREP 26ML APPLICATOR (WOUND CARE) ×3 IMPLANT
ELECT BLADE 4.0 EZ CLEAN MEGAD (MISCELLANEOUS) ×3
ELECT REM PT RETURN 9FT ADLT (ELECTROSURGICAL) ×3
ELECTRODE BLDE 4.0 EZ CLN MEGD (MISCELLANEOUS) IMPLANT
ELECTRODE REM PT RTRN 9FT ADLT (ELECTROSURGICAL) ×1 IMPLANT
GAUZE 4X4 16PLY RFD (DISPOSABLE) IMPLANT
GAUZE SPONGE 4X4 12PLY STRL (GAUZE/BANDAGES/DRESSINGS) ×3 IMPLANT
GLOVE BIO SURGEON STRL SZ7 (GLOVE) ×4 IMPLANT
GLOVE BIO SURGEON STRL SZ8 (GLOVE) ×6 IMPLANT
GLOVE BIOGEL PI IND STRL 7.5 (GLOVE) IMPLANT
GLOVE BIOGEL PI IND STRL 8 (GLOVE) ×2 IMPLANT
GLOVE BIOGEL PI IND STRL 8.5 (GLOVE) ×2 IMPLANT
GLOVE BIOGEL PI INDICATOR 7.5 (GLOVE) ×4
GLOVE BIOGEL PI INDICATOR 8 (GLOVE) ×10
GLOVE BIOGEL PI INDICATOR 8.5 (GLOVE) ×4
GLOVE ECLIPSE 8.0 STRL XLNG CF (GLOVE) ×6 IMPLANT
GLOVE EXAM NITRILE XL STR (GLOVE) IMPLANT
GOWN STRL REUS W/ TWL LRG LVL3 (GOWN DISPOSABLE) IMPLANT
GOWN STRL REUS W/ TWL XL LVL3 (GOWN DISPOSABLE) ×2 IMPLANT
GOWN STRL REUS W/TWL 2XL LVL3 (GOWN DISPOSABLE) ×6 IMPLANT
GOWN STRL REUS W/TWL LRG LVL3 (GOWN DISPOSABLE)
GOWN STRL REUS W/TWL XL LVL3 (GOWN DISPOSABLE) ×6
GRAFT BNE CANC CHIPS 1-8 40CC (Bone Implant) IMPLANT
HEMOSTAT POWDER SURGIFOAM 1G (HEMOSTASIS) ×2 IMPLANT
IMPL TLX20 7X11X26 20D (Cage) IMPLANT
KIT BASIN OR (CUSTOM PROCEDURE TRAY) ×3 IMPLANT
KIT INFUSE XX SMALL 0.7CC (Orthopedic Implant) ×2 IMPLANT
KIT POSITION SURG JACKSON T1 (MISCELLANEOUS) ×3 IMPLANT
KIT TURNOVER KIT B (KITS) ×3 IMPLANT
MILL MEDIUM DISP (BLADE) ×2 IMPLANT
NDL HYPO 21X1.5 SAFETY (NEEDLE) IMPLANT
NDL HYPO 25X1 1.5 SAFETY (NEEDLE) ×1 IMPLANT
NDL SPNL 18GX3.5 QUINCKE PK (NEEDLE) IMPLANT
NEEDLE HYPO 21X1.5 SAFETY (NEEDLE) ×3 IMPLANT
NEEDLE HYPO 25X1 1.5 SAFETY (NEEDLE) ×3 IMPLANT
NEEDLE SPNL 18GX3.5 QUINCKE PK (NEEDLE) IMPLANT
NS IRRIG 1000ML POUR BTL (IV SOLUTION) ×3 IMPLANT
OIL CARTRIDGE MAESTRO DRILL (MISCELLANEOUS) ×3
PACK LAMINECTOMY NEURO (CUSTOM PROCEDURE TRAY) ×3 IMPLANT
PAD ARMBOARD 7.5X6 YLW CONV (MISCELLANEOUS) ×9 IMPLANT
PATTIES SURGICAL .5 X.5 (GAUZE/BANDAGES/DRESSINGS) IMPLANT
PATTIES SURGICAL .5 X1 (DISPOSABLE) IMPLANT
PATTIES SURGICAL 1X1 (DISPOSABLE) IMPLANT
ROD RELINE LORDOTIC 5.5X45 (Rod) ×4 IMPLANT
SCREW LOCK RELINE 5.5 TULIP (Screw) ×8 IMPLANT
SCREW RELINE-O POLY 6.5X45 (Screw) ×4 IMPLANT
SCREW RELINE-O POLY 6.5X50MM (Screw) ×4 IMPLANT
SPONGE LAP 4X18 RFD (DISPOSABLE) IMPLANT
SPONGE SURGIFOAM ABS GEL 100 (HEMOSTASIS) ×1 IMPLANT
STAPLER SKIN PROX WIDE 3.9 (STAPLE) IMPLANT
SUT VIC AB 1 CT1 18XBRD ANBCTR (SUTURE) ×2 IMPLANT
SUT VIC AB 1 CT1 8-18 (SUTURE) ×6
SUT VIC AB 2-0 CT1 18 (SUTURE) ×6 IMPLANT
SUT VIC AB 3-0 SH 8-18 (SUTURE) ×6 IMPLANT
SYR 3ML LL SCALE MARK (SYRINGE) ×6 IMPLANT
SYR 5ML LL (SYRINGE) IMPLANT
TLX20 IMPLANT 7X11X26 20D (Cage) ×3 IMPLANT
TOWEL GREEN STERILE (TOWEL DISPOSABLE) ×3 IMPLANT
TOWEL GREEN STERILE FF (TOWEL DISPOSABLE) ×3 IMPLANT
TRAY FOLEY MTR SLVR 16FR STAT (SET/KITS/TRAYS/PACK) ×3 IMPLANT
WATER STERILE IRR 1000ML POUR (IV SOLUTION) ×3 IMPLANT

## 2018-10-27 NOTE — Interval H&P Note (Signed)
History and Physical Interval Note:  10/27/2018 10:31 AM  Tammy [name]  has presented today for surgery, with the diagnosis of Radiculopathy, Lumbar region.  The various methods of treatment have been discussed with the patient and family. After consideration of risks, benefits and other options for treatment, the patient has consented to  Procedure(s) with comments: Left Lumbar 5 sacral 1 Transforaminal lumbar interbody fusion (Left) - Left Lumbar 5 sacral 1 Transforaminal lumbar interbody fusion as a surgical intervention.  The patient's history has been reviewed, patient examined, no change in status, stable for surgery.  I have reviewed the patient's chart and labs.  Questions were answered to the patient's satisfaction.     Peggyann Shoals

## 2018-10-27 NOTE — Op Note (Signed)
10/27/2018  2:04 PM  PATIENT:  Tammy [name]  85 y.o. female  PRE-OPERATIVE DIAGNOSIS:  Radiculopathy, Lumbar region, herniated lumbar disc, foraminal stenosis, spondylolisthesis, lumbago L 5 S 1 level  POST-OPERATIVE DIAGNOSIS:  Radiculopathy, Lumbar region, herniated lumbar disc, foraminal stenosis, spondylolisthesis, lumbago L 5 S 1 level   PROCEDURE:  Procedure(s): Left Lumbar five-sacral one Transforaminal lumbar interbody fusion (Left) with expandable titanium cage, autograft, pedicle screw fixation, posterolateral arthrodesis L 5 S 1 level  SURGEON:  Surgeon(s) and Role:    Erline Levine, MD - Primary  PHYSICIAN ASSISTANT:   ASSISTANTS: Poteat, RN   ANESTHESIA:   general  EBL:  75 mL   BLOOD ADMINISTERED:none  DRAINS: none   LOCAL MEDICATIONS USED:  MARCAINE    and LIDOCAINE   SPECIMEN:  No Specimen  DISPOSITION OF SPECIMEN:  N/A  COUNTS:  YES  TOURNIQUET:  * No tourniquets in log *  DICTATION: Patient is 85 year old woman with mobile spondylolisthesis of L5 on S1 with foraminal and extraforaminal disc herniation on the left at the L 5 S 1 level.   She has a severe left L5 radiculopathy. It was elected to take her to surgery for decompression and fusion at this level.   Procedure: Patient was placed in a prone position on the [place] table after smooth and uncomplicated induction of general endotracheal anesthesia. Her low back was prepped after marking anatomy with C arm and draped in usual sterile fashion with betadine scrub and  DuraPrep. Area of incision was infiltrated with local lidocaine. Incision was made to the lumbodorsal fascia was incised and exposure was performed of the L5 through S1 spinous processes laminae facet joint and transverse processes. Intraoperative x-ray was obtained which confirmed correct orientation. A total hemilaminectomy of L5 was performed on the left with disarticulation of the facet joint at this level and thorough decompression was  performed of both L5 and S 1 nerve roots along with the common dural tube. This decompression was more involved than would be typical of that performed for PLIF alone and included painstaking dissection of adherent ligament compressing the thecal sac and wide decompression of all neural elements. A lateral disc herniation was removed with multiple subligamentous fragments removed from under the exiting L 5 nerve root.  A thorough discectomy was initially performed on the left with preparation of the endplates for grafting a trial spacer was placed this level and a thorough discectomy was performed on the right as well. After thorough discectomy on the left, an expandable titanium TLIF cage was packed with extra extra small BMP  And tamped into position ( 7 x 11 x 26 mm x 15 degrees)  were packed with BMP and  and was inserted the interspace and countersunk appropriately along with 6 cc of morselized bone autograft medial to the cage. The posterolateral region was extensively decorticated and pedicle probes were placed at L 5 and S 1 bilaterally. Intraoperative fluoroscopy confirmed correct orientationin the AP and lateral plane. 45 x 6.5 mm pedicle screws were placed at S 1 bilaterally and 50 x 6.5 mm screws placed at L 5 bilaterally final x-rays demonstrated well-positioned interbody grafts and pedicle screw fixation. A 45 mm lordotic rod was placed on the right and a 45 mm rod was placed on the left locked down in situ and the posterolateral region on the right was packed with the remaining bone graft (20 cc) of autograft and allograft chips. The wound was irrigated  And long-acting Marcaine  was injected in the deep musculature.  Fascia was closed with 1 Vicryl sutures skin edges were reapproximated 2 and 3-0 Vicryl sutures. The wound is dressed with Dermabond and an occlusive dressing.  the patient was extubated in the operating room and taken to recovery in stable satisfactory condition. She tolerated the  operation well counts were correct at the end of the case.   PLAN OF CARE: Admit to inpatient   PATIENT DISPOSITION:  PACU - hemodynamically stable.   Delay start of Pharmacological VTE agent (>24hrs) due to surgical blood loss or risk of bleeding: yes

## 2018-10-27 NOTE — Transfer of Care (Signed)
Immediate Anesthesia Transfer of Care Note  Patient: [name] Decaire  Procedure(s) Performed: Left Lumbar five-sacral one Transforaminal lumbar interbody fusion (Left Back)  Patient Location: PACU  Anesthesia Type:MAC  Level of Consciousness: awake, alert , oriented and patient cooperative  Airway & Oxygen Therapy: Patient Spontanous Breathing and Patient connected to face mask oxygen  Post-op Assessment: Report given to RN and Post -op Vital signs reviewed and stable  Post vital signs: Reviewed and stable  Last Vitals:  Vitals Value Taken Time  BP 130/60 10/27/18 1410  Temp 36.5 C 10/27/18 1410  Pulse 52 10/27/18 1411  Resp 15 10/27/18 1411  SpO2 99 % 10/27/18 1411  Vitals shown include unvalidated device data.  Last Pain:  Vitals:   10/27/18 1002  TempSrc:   PainSc: 5       Patients Stated Pain Goal: 3 (74/14/23 9532)  Complications: No apparent anesthesia complications

## 2018-10-27 NOTE — Anesthesia Postprocedure Evaluation (Signed)
Anesthesia Post Note  Patient: [name] Swab  Procedure(s) Performed: Left Lumbar five-sacral one Transforaminal lumbar interbody fusion (Left Back)     Patient location during evaluation: PACU Anesthesia Type: General Level of consciousness: awake and alert, patient cooperative and oriented Pain management: pain level controlled Vital Signs Assessment: post-procedure vital signs reviewed and stable Respiratory status: spontaneous breathing, nonlabored ventilation, respiratory function stable and patient connected to nasal cannula oxygen Cardiovascular status: blood pressure returned to baseline and stable Postop Assessment: no apparent nausea or vomiting Anesthetic complications: no    Last Vitals:  Vitals:   10/27/18 1555 10/27/18 1620  BP:  (!) 156/63  Pulse: (!) 52 (!) 56  Resp: 13 18  Temp: 36.6 C 36.4 C  SpO2: 92% 96%    Last Pain:  Vitals:   10/27/18 1620  TempSrc: Oral  PainSc:                  Riley Hallum,E. Amman Bartel

## 2018-10-27 NOTE — Brief Op Note (Signed)
10/27/2018  2:04 PM  PATIENT:  Tammy [name]  85 y.o. female  PRE-OPERATIVE DIAGNOSIS:  Radiculopathy, Lumbar region, herniated lumbar disc, foraminal stenosis, spondylolisthesis, lumbago L 5 S 1 level  POST-OPERATIVE DIAGNOSIS:  Radiculopathy, Lumbar region, herniated lumbar disc, foraminal stenosis, spondylolisthesis, lumbago L 5 S 1 level   PROCEDURE:  Procedure(s): Left Lumbar five-sacral one Transforaminal lumbar interbody fusion (Left) with expandable titanium cage, autograft, pedicle screw fixation, posterolateral arthrodesis L 5 S 1 level  SURGEON:  Surgeon(s) and Role:    Erline Levine, MD - Primary  PHYSICIAN ASSISTANT:   ASSISTANTS: Poteat, RN   ANESTHESIA:   general  EBL:  75 mL   BLOOD ADMINISTERED:none  DRAINS: none   LOCAL MEDICATIONS USED:  MARCAINE    and LIDOCAINE   SPECIMEN:  No Specimen  DISPOSITION OF SPECIMEN:  N/A  COUNTS:  YES  TOURNIQUET:  * No tourniquets in log *  DICTATION: Patient is 85 year old woman with mobile spondylolisthesis of L5 on S1 with foraminal and extraforaminal disc herniation on the left at the L 5 S 1 level.   She has a severe left L5 radiculopathy. It was elected to take her to surgery for decompression and fusion at this level.   Procedure: Patient was placed in a prone position on the [place] table after smooth and uncomplicated induction of general endotracheal anesthesia. Her low back was prepped after marking anatomy with C arm and draped in usual sterile fashion with betadine scrub and  DuraPrep. Area of incision was infiltrated with local lidocaine. Incision was made to the lumbodorsal fascia was incised and exposure was performed of the L5 through S1 spinous processes laminae facet joint and transverse processes. Intraoperative x-ray was obtained which confirmed correct orientation. A total hemilaminectomy of L5 was performed on the left with disarticulation of the facet joint at this level and thorough decompression was  performed of both L5 and S 1 nerve roots along with the common dural tube. This decompression was more involved than would be typical of that performed for PLIF alone and included painstaking dissection of adherent ligament compressing the thecal sac and wide decompression of all neural elements. A lateral disc herniation was removed with multiple subligamentous fragments removed from under the exiting L 5 nerve root.  A thorough discectomy was initially performed on the left with preparation of the endplates for grafting a trial spacer was placed this level and a thorough discectomy was performed on the right as well. After thorough discectomy on the left, an expandable titanium TLIF cage was packed with extra extra small BMP  And tamped into position ( 7 x 11 x 26 mm x 15 degrees)  were packed with BMP and  and was inserted the interspace and countersunk appropriately along with 6 cc of morselized bone autograft medial to the cage. The posterolateral region was extensively decorticated and pedicle probes were placed at L 5 and S 1 bilaterally. Intraoperative fluoroscopy confirmed correct orientationin the AP and lateral plane. 45 x 6.5 mm pedicle screws were placed at S 1 bilaterally and 50 x 6.5 mm screws placed at L 5 bilaterally final x-rays demonstrated well-positioned interbody grafts and pedicle screw fixation. A 45 mm lordotic rod was placed on the right and a 45 mm rod was placed on the left locked down in situ and the posterolateral region on the right was packed with the remaining bone graft (20 cc) of autograft and allograft chips. The wound was irrigated  And long-acting Marcaine  was injected in the deep musculature.  Fascia was closed with 1 Vicryl sutures skin edges were reapproximated 2 and 3-0 Vicryl sutures. The wound is dressed with Dermabond and an occlusive dressing.  the patient was extubated in the operating room and taken to recovery in stable satisfactory condition. She tolerated the  operation well counts were correct at the end of the case.   PLAN OF CARE: Admit to inpatient   PATIENT DISPOSITION:  PACU - hemodynamically stable.   Delay start of Pharmacological VTE agent (>24hrs) due to surgical blood loss or risk of bleeding: yes

## 2018-10-27 NOTE — Progress Notes (Signed)
Awake, alert, conversant.  Leg pain much improved.  Strength full in both legs.  Doing well.

## 2018-10-27 NOTE — Anesthesia Procedure Notes (Signed)
Procedure Name: Intubation Date/Time: 10/27/2018 11:43 AM Performed by: Renato Shin, CRNA Pre-anesthesia Checklist: Patient identified, Emergency Drugs available, Suction available and Patient being monitored Patient Re-evaluated:Patient Re-evaluated prior to induction Oxygen Delivery Method: Circle system utilized Preoxygenation: Pre-oxygenation with 100% oxygen Induction Type: IV induction Ventilation: Mask ventilation without difficulty and Oral airway inserted - appropriate to patient size Laryngoscope Size: Miller and 3 Grade View: Grade I Tube type: Oral Tube size: 7.0 mm Number of attempts: 1 Airway Equipment and Method: Stylet and Oral airway Placement Confirmation: ETT inserted through vocal cords under direct vision,  positive ETCO2 and breath sounds checked- equal and bilateral Secured at: 21 cm Tube secured with: Tape Dental Injury: Teeth and Oropharynx as per pre-operative assessment

## 2018-10-28 ENCOUNTER — Encounter (HOSPITAL_COMMUNITY): Payer: Self-pay | Admitting: Neurosurgery

## 2018-10-28 MED ORDER — METHOCARBAMOL 500 MG PO TABS: 500.0000 mg | ORAL_TABLET | Freq: Four times a day (QID) | ORAL | 1 refills | Status: DC | PRN

## 2018-10-28 MED ORDER — HYDROCODONE-ACETAMINOPHEN 5-325 MG PO TABS: 1.0000 | ORAL_TABLET | ORAL | 0 refills | Status: DC | PRN

## 2018-10-28 NOTE — Discharge Instructions (Signed)

## 2018-10-28 NOTE — Progress Notes (Addendum)
Subjective:  Patient reports "I'm fine. I've been walking in the hall"   Objective: Vital signs in last 24 hours: Temp:  [97.5 F (36.4 C)-98.5 F (36.9 C)] 98.5 F (36.9 C) (10/02 0722) Pulse Rate:  [50-58] 54 (10/02 0722) Resp:  [9-19] 18 (10/02 0722) BP: (102-178)/(41-63) 119/41 (10/02 0722) SpO2:  [92 %-99 %] 94 % (10/02 0722) Weight:  [79.8 kg] 79.8 kg (10/01 0907)  Intake/Output from previous day: 10/01 0701 - 10/02 0700 In: 1800 [P.O.:100; I.V.:1700] Out: 500 [Urine:425; Blood:75] Intake/Output this shift: No intake/output data recorded.  Alert, sitting at EOB, eating breakfast. Good strength BLE. Incision flat without erythema or drainage beneath honeycomb and Dermabond. She reports no leg pain, only incisional soreness with ROM.  Lab Results: Recent Labs    10/25/18 1010  WBC 8.5  HGB 13.8  HCT 42.9  PLT 289   BMET Recent Labs    10/25/18 1010  NA 140  K 4.7  CL 106  CO2 22  GLUCOSE 104*  BUN 30*  CREATININE 1.61*  CALCIUM 9.5    Studies/Results: Dg Lumbar Spine 2-3 Views  Result Date: 10/27/2018 CLINICAL DATA:  Surgical posterior fusion of L5-S1. EXAM: DG C-ARM 1-60 MIN; LUMBAR SPINE - 2-3 VIEW CONTRAST:  None. FLUOROSCOPY TIME:  Fluoroscopy Time:  55 seconds. Number of Acquired Spot Images: 3. COMPARISON:  Radiographs of August 24, 2018. FINDINGS: Three intraoperative fluoroscopic images of the lumbosacral spine demonstrate the patient be status post surgical posterior fusion of L5-S1 with bilateral intrapedicular screw placement and interbody fusion. IMPRESSION: Fluoroscopic guidance provided during surgical posterior fusion of L5-S1. Electronically Signed   By: Marijo Conception M.D.   On: 10/27/2018 14:15   Dg C-arm 1-60 Min  Result Date: 10/27/2018 CLINICAL DATA:  Surgical posterior fusion of L5-S1. EXAM: DG C-ARM 1-60 MIN; LUMBAR SPINE - 2-3 VIEW CONTRAST:  None. FLUOROSCOPY TIME:  Fluoroscopy Time:  55 seconds. Number of Acquired Spot Images: 3.  COMPARISON:  Radiographs of August 24, 2018. FINDINGS: Three intraoperative fluoroscopic images of the lumbosacral spine demonstrate the patient be status post surgical posterior fusion of L5-S1 with bilateral intrapedicular screw placement and interbody fusion. IMPRESSION: Fluoroscopic guidance provided during surgical posterior fusion of L5-S1. Electronically Signed   By: Marijo Conception M.D.   On: 10/27/2018 14:15    Assessment/Plan: Improving  LOS: 1 day  Ok to d/c to home per Dr. Vertell Limber. Rx's for Norco and Robaxin will be eRx'ed for prn home use. She veralizes understanding of d/c instructions and agrees to call office to schedule 3-4 week f/u appt.   Verdis Prime 10/28/2018, 7:38 AM   Patient doing well.  Discharge home.

## 2018-10-28 NOTE — Plan of Care (Signed)
Pt doing well. Pt and husband given D/C instructions with verbal understanding. Rx's were sent to pharmacy by MD. Pt's incision is clean and dry with no sign of infection. Pt's IV was removed prior to D/C. Pt D/C'd home via wheelchair @ 0900 per MD order. Pt is stable @ D/C and has no other needs at this time. Holli Humbles, RN

## 2018-10-28 NOTE — Discharge Summary (Signed)
Physician Discharge Summary  Patient ID: Tammy [name] MRN: [id number] DOB/AGE: [birth year] 85 y.o.  Admit date: 10/27/2018 Discharge date: 10/28/2018  Admission Diagnoses: Radiculopathy, Lumbar region, herniated lumbar disc, foraminal stenosis, spondylolisthesis, lumbago L 5 S 1 level    Discharge Diagnoses: Radiculopathy, Lumbar region, herniated lumbar disc, foraminal stenosis, spondylolisthesis, lumbago L 5 S 1 level s/p Left Lumbar five-sacral one Transforaminal lumbar interbody fusion (Left) with expandable titanium cage, autograft, pedicle screw fixation, posterolateral arthrodesis L 5 S 1 level    Active Problems:   Spondylolisthesis of lumbar region   Discharged Condition: good  Hospital Course: Jeri Campione was admitted for surgery with dx lumbar radiculopathy, stenosis, and listhesis. Following uncomplicated surgery (above) she has mobilized nicely with improved ( decreased) pain level.  Consults: None  Significant Diagnostic Studies: radiology: X-Ray: intra-op  Treatments: surgery: Left Lumbar five-sacral one Transforaminal lumbar interbody fusion (Left) with expandable titanium cage, autograft, pedicle screw fixation, posterolateral arthrodesis L 5 S 1 level    Discharge Exam: Blood pressure (!) 119/41, pulse (!) 54, temperature 98.5 F (36.9 C), temperature source Oral, resp. rate 18, height 5\' 7"  (1.702 m), weight 79.8 kg, SpO2 94 %. Alert, sitting at EOB, eating breakfast. Good strength BLE. Incision flat without erythema or drainage beneath honeycomb and Dermabond. She reports no leg pain, only incisional soreness with ROM.    Disposition:  Discharge to home. Rx's for Norco and Robaxin will be eRx'ed for prn home use. She veralizes understanding of d/c instructions and agrees to call office to schedule 3-4 week f/u appt.    Allergies as of 10/28/2018      Reactions   Tuberculin Tests Other (See Comments)   Tuberculosis shot, red spot on arm       Medication List    STOP taking these medications   apixaban 2.5 MG Tabs tablet Commonly known as: Eliquis   potassium chloride SA 20 MEQ tablet Commonly known as: KLOR-CON     TAKE these medications   acetaminophen 650 MG CR tablet Commonly known as: TYLENOL Take 1,300 mg by mouth every 8 (eight) hours as needed for pain.   amiodarone 200 MG tablet Commonly known as: PACERONE TAKE ONE TABLET TWICE DAILY FOR ONE WEEK THEN DECREASE TO 1 TABLET DAILY What changed: See the new instructions.   amLODipine 10 MG tablet Commonly known as: NORVASC Take 1 tablet (10 mg total) by mouth daily.   atorvastatin 20 MG tablet Commonly known as: LIPITOR Take 1 tablet (20 mg total) by mouth daily at 6 PM.   furosemide 20 MG tablet Commonly known as: LASIX Take 20 mg by mouth daily.   HYDROcodone-acetaminophen 5-325 MG tablet Commonly known as: NORCO/VICODIN Take 1 tablet by mouth every 6 (six) hours as needed for severe pain. What changed: Another medication with the same name was added. Make sure you understand how and when to take each.   HYDROcodone-acetaminophen 5-325 MG tablet Commonly known as: NORCO/VICODIN Take 1-2 tablets by mouth every 4 (four) hours as needed for severe pain ((score 7 to 10)). What changed: You were already taking a medication with the same name, and this prescription was added. Make sure you understand how and when to take each.   levothyroxine 25 MCG tablet Commonly known as: SYNTHROID Take 1 tablet (25 mcg total) by mouth daily.   methocarbamol 500 MG tablet Commonly known as: ROBAXIN Take 1 tablet (500 mg total) by mouth every 6 (six) hours as needed for muscle spasms.   metoprolol  succinate 25 MG 24 hr tablet Commonly known as: TOPROL-XL TAKE 1 TABLET BY MOUTH EVERY DAY        Signed: Peggyann Shoals, MD 10/28/2018, 8:00 AM

## 2018-10-28 NOTE — Evaluation (Signed)
Occupational Therapy Evaluation Patient Details Name: Tammy [name] MRN: [id number] DOB: 22-Sep-1931 Today's Date: 10/28/2018    History of Present Illness Pt is an 85 yo female s/ p Left L5-S1 TLIF PMHx: HTN, Afib   Clinical Impression   Pt PTA: pt living with spouse and performing ADL with assist for LB ADL from spouse. Pt reports using SPC for mobility. Pt currently performing OOB ADL with supervisionA for UB ADL and LB ADL with modA overall for donning, pt with set-upA for doffing. Pt's spouse in room for instructions. Pt requires minA for sit to stand as pt unsteady on feet and requires minA for power up. Back handout provided and reviewed ADL in detail. Pt educated on: clothing between brace, never sleep in brace, set an alarm at night for medication, avoid sitting for long periods of time, correct bed positioning for sleeping, correct sequence for bed mobility, avoiding lifting more than 5 pounds and never wash directly over incision. All education is complete and patient indicates understanding. Pt does not require continued OT skilled services. OT signing off.      Follow Up Recommendations  Follow surgeon's recommendation for DC plan and follow-up therapies;Supervision/Assistance - 24 hour    Equipment Recommendations  None recommended by OT    Recommendations for Other Services       Precautions / Restrictions Precautions Precautions: Back;Fall Precaution Booklet Issued: Yes (comment) Precaution Comments: verbally discussed handout Required Braces or Orthoses: Other Brace Other Brace: LSO Restrictions Weight Bearing Restrictions: No      Mobility Bed Mobility Overal bed mobility: Needs Assistance Bed Mobility: Rolling;Sidelying to Sit;Sit to Sidelying Rolling: Min guard Sidelying to sit: Min guard     Sit to sidelying: Min guard General bed mobility comments: minguardA for LB maintenance and pulling up for trunk elevation.  Transfers Overall transfer level: Needs  assistance Equipment used: Rolling walker (2 wheeled) Transfers: Sit to/from Stand Sit to Stand: Min assist         General transfer comment: minA for sit to stand for initial power up    Balance Overall balance assessment: Needs assistance Sitting-balance support: Feet supported Sitting balance-Leahy Scale: Good       Standing balance-Leahy Scale: Poor Standing balance comment: reliant on RW                           ADL either performed or assessed with clinical judgement   ADL Overall ADL's : Needs assistance/impaired Eating/Feeding: Modified independent;Sitting   Grooming: Supervision/safety;Standing   Upper Body Bathing: Set up;Sitting   Lower Body Bathing: Moderate assistance;Sitting/lateral leans;Sit to/from stand;Cueing for safety   Upper Body Dressing : Set up;Sitting   Lower Body Dressing: Moderate assistance;Cueing for safety;Sitting/lateral leans;Sit to/from stand   Toilet Transfer: Minimal assistance;Cueing for safety;Cueing for sequencing;Regular Toilet;Ambulation;RW;Grab bars   Toileting- Clothing Manipulation and Hygiene: Min guard;Cueing for safety;Cueing for sequencing       Functional mobility during ADLs: Min guard;Rolling walker;Cueing for safety General ADL Comments: Pt performing OOB ADL with supervisionA for UB ADL and LB ADL with modA overall for donning, pt with set-upA for doffing,.     Vision Baseline Vision/History: No visual deficits Vision Assessment?: No apparent visual deficits     Perception     Praxis      Pertinent Vitals/Pain Pain Assessment: 0-10 Pain Score: 3  Pain Location: low back Pain Descriptors / Indicators: Discomfort Pain Intervention(s): Monitored during session;Premedicated before session     Hand  Dominance Right   Extremity/Trunk Assessment Upper Extremity Assessment Upper Extremity Assessment: Overall WFL for tasks assessed   Lower Extremity Assessment Lower Extremity Assessment:  Generalized weakness   Cervical / Trunk Assessment Cervical / Trunk Assessment: Other exceptions Cervical / Trunk Exceptions: s/p back sx   Communication Communication Communication: HOH   Cognition Arousal/Alertness: Awake/alert Behavior During Therapy: WFL for tasks assessed/performed Overall Cognitive Status: Within Functional Limits for tasks assessed                                     General Comments  Spouse in room for all of therapy session    Exercises     Shoulder Instructions      Home Living Family/patient expects to be discharged to:: Private residence Living Arrangements: Spouse/significant other Available Help at Discharge: Family;Available 24 hours/day Type of Home: House Home Access: Stairs to enter [employer] of Steps: 3   Home Layout: One level     Bathroom Shower/Tub: Teacher, early years/pre: Handicapped height     Home Equipment: Cane - single point          Prior Functioning/Environment Level of Independence: Needs assistance  Gait / Transfers Assistance Needed: Pt was sleeping on couch and assisted for mobility and transfers ADL's / Homemaking Assistance Needed: Pt was requiring assist for ADL due to weakness and pain            OT Problem List: Impaired balance (sitting and/or standing)      OT Treatment/Interventions:      OT Goals(Current goals can be found in the care plan section) Acute Rehab OT Goals Patient Stated Goal: to go home OT Goal Formulation: With patient Time For Goal Achievement: 11/11/18 Potential to Achieve Goals: Good  OT Frequency:     Barriers to D/C:            Co-evaluation              AM-PAC OT "6 Clicks" Daily Activity     Outcome Measure Help from another person eating meals?: None Help from another person taking care of personal grooming?: A Little Help from another person toileting, which includes using toliet, bedpan, or urinal?: A Little Help  from another person bathing (including washing, rinsing, drying)?: A Little Help from another person to put on and taking off regular upper body clothing?: None Help from another person to put on and taking off regular lower body clothing?: A Little 6 Click Score: 20   End of Session Equipment Utilized During Treatment: Rolling walker;Back brace Nurse Communication: Mobility status  Activity Tolerance: Patient tolerated treatment well Patient left: in chair;with call bell/phone within reach;with family/visitor present  OT Visit Diagnosis: Unsteadiness on feet (R26.81);Muscle weakness (generalized) (M62.81)                Time: [id number] OT Time Calculation (min): 24 min Charges:  OT General Charges $OT Visit: 1 Visit OT Evaluation $OT Eval Moderate Complexity: 1 Mod OT Treatments $Self Care/Home Management : 8-22 mins  Ebony Hail Harold Hedge) Marsa Aris OTR/L Acute Rehabilitation Services Pager: [phone] Office: [place] 10/28/2018, 8:59 AM

## 2018-10-28 NOTE — Evaluation (Signed)
Physical Therapy Evaluation & Discharge Patient Details Name: Tammy [name] MRN: [id number] DOB: November 19, 1931 Today's Date: 10/28/2018   History of Present Illness  Pt is an 85 yo female s/ p Left L5-S1 TLIF PMHx: HTN, Afib  Clinical Impression  Pt presented seated EOB with husband present, awake and willing to participate in therapy session. Prior to admission, pt reported that she was requiring some assistance from family for mobility and ADLs. Pt lives with her husband in a single level home with three steps to enter. She will have 24/7 supervision/assistance upon d/c. At the time of evaluation, pt requiring min A for transfers, min guard for ambulation with RW and min guard with cueing for stair training. Pt with no overt LOB during mobility this session. PT provided pt education re: back precautions, car transfers and a generalized walking program for pt to initiate upon d/c home. PT answered all of pt and pt's husband's questions at end of session. No further acute PT needs identified at this time. PT signing off.    Follow Up Recommendations No PT follow up;Supervision for mobility/OOB    Equipment Recommendations  None recommended by PT    Recommendations for Other Services       Precautions / Restrictions Precautions Precautions: Back;Fall Precaution Booklet Issued: Yes (comment) Precaution Comments: reviewed 3/3 back precautions with pt throughout session Required Braces or Orthoses: Spinal Brace Spinal Brace: Lumbar corset;Applied in sitting position Other Brace: LSO Restrictions Weight Bearing Restrictions: No      Mobility  Bed Mobility Overal bed mobility: Needs Assistance Bed Mobility: Rolling;Sidelying to Sit;Sit to Sidelying Rolling: Min guard Sidelying to sit: Min guard     Sit to sidelying: Min guard General bed mobility comments: pt seated EOB with husband present upon arrival  Transfers Overall transfer level: Needs assistance Equipment used: Rolling walker  (2 wheeled) Transfers: Sit to/from Stand Sit to Stand: Min assist         General transfer comment: min A needed to power into standing from sitting at EOB with bed in standard position; good technique utilized  Ambulation/Gait Ambulation/Gait assistance: Counsellor (Feet): 400 Feet Assistive device: Rolling walker (2 wheeled) Gait Pattern/deviations: Step-through pattern;Decreased stride length Gait velocity: decreased   General Gait Details: pt generally steady overall with use of RW; no overt LOB or need for physical assistance, min guard for safety  Stairs Stairs: Yes Stairs assistance: Min guard Stair Management: One rail Right;Step to pattern;Forwards Number of Stairs: 3 General stair comments: min guard for safety, cueing for technique, no LOB or need for physical assistance  Wheelchair Mobility    Modified Rankin (Stroke Patients Only)       Balance Overall balance assessment: Needs assistance Sitting-balance support: Feet supported Sitting balance-Leahy Scale: Good     Standing balance support: During functional activity;Bilateral upper extremity supported;Single extremity supported Standing balance-Leahy Scale: Poor Standing balance comment: reliant on RW                             Pertinent Vitals/Pain Pain Assessment: 0-10 Pain Score: 3  Pain Location: low back Pain Descriptors / Indicators: Discomfort Pain Intervention(s): Monitored during session;Repositioned    Home Living Family/patient expects to be discharged to:: Private residence Living Arrangements: Spouse/significant other Available Help at Discharge: Family;Available 24 hours/day Type of Home: House Home Access: Stairs to enter   [employer] of Steps: 3 Home Layout: One level Home Equipment: Cane - single  point      Prior Function Level of Independence: Needs assistance   Gait / Transfers Assistance Needed: Pt was sleeping on couch and assisted  for mobility and transfers  ADL's / Homemaking Assistance Needed: Pt was requiring assist for ADL due to weakness and pain        Hand Dominance   Dominant Hand: Right    Extremity/Trunk Assessment   Upper Extremity Assessment Upper Extremity Assessment: Defer to OT evaluation;Overall WFL for tasks assessed    Lower Extremity Assessment Lower Extremity Assessment: Overall WFL for tasks assessed    Cervical / Trunk Assessment Cervical / Trunk Assessment: Other exceptions Cervical / Trunk Exceptions: s/p lumbar sx  Communication   Communication: HOH  Cognition Arousal/Alertness: Awake/alert Behavior During Therapy: WFL for tasks assessed/performed Overall Cognitive Status: Within Functional Limits for tasks assessed                                        General Comments General comments (skin integrity, edema, etc.): Spouse in room for all of therapy session    Exercises     Assessment/Plan    PT Assessment Patent does not need any further PT services  PT Problem List         PT Treatment Interventions      PT Goals (Current goals can be found in the Care Plan section)  Acute Rehab PT Goals Patient Stated Goal: "home today" PT Goal Formulation: All assessment and education complete, DC therapy    Frequency     Barriers to discharge        Co-evaluation               AM-PAC PT "6 Clicks" Mobility  Outcome Measure Help needed turning from your back to your side while in a flat bed without using bedrails?: A Little Help needed moving from lying on your back to sitting on the side of a flat bed without using bedrails?: A Little Help needed moving to and from a bed to a chair (including a wheelchair)?: A Little Help needed standing up from a chair using your arms (e.g., wheelchair or bedside chair)?: A Little Help needed to walk in hospital room?: None Help needed climbing 3-5 steps with a railing? : None 6 Click Score: 20    End  of Session Equipment Utilized During Treatment: Gait belt;Back brace Activity Tolerance: Patient tolerated treatment well Patient left: in bed;with call bell/phone within reach;with family/visitor present;Other (comment)(seated EOB with OT entering room) Nurse Communication: Mobility status PT Visit Diagnosis: Other abnormalities of gait and mobility (R26.89);Pain Pain - part of body: (back)    Time: [id number] PT Time Calculation (min) (ACUTE ONLY): 18 min   Charges:   PT Evaluation $PT Eval Low Complexity: 1 Low          Sherie Don, PT, DPT  Acute Rehabilitation Services Pager [phone] Office [place] 10/28/2018, 9:22 AM

## 2018-11-16 DIAGNOSIS — M5416 Radiculopathy, lumbar region: Secondary | ICD-10-CM | POA: Diagnosis not present

## 2018-11-16 DIAGNOSIS — I10 Essential (primary) hypertension: Secondary | ICD-10-CM | POA: Diagnosis not present

## 2018-11-16 DIAGNOSIS — M545 Low back pain: Secondary | ICD-10-CM | POA: Diagnosis not present

## 2018-11-16 DIAGNOSIS — Z6829 Body mass index (BMI) 29.0-29.9, adult: Secondary | ICD-10-CM | POA: Diagnosis not present

## 2018-11-16 DIAGNOSIS — M5126 Other intervertebral disc displacement, lumbar region: Secondary | ICD-10-CM | POA: Diagnosis not present

## 2018-11-16 DIAGNOSIS — M4316 Spondylolisthesis, lumbar region: Secondary | ICD-10-CM | POA: Diagnosis not present

## 2018-11-25 ENCOUNTER — Ambulatory Visit: Payer: Medicare HMO | Admitting: Cardiology

## 2018-11-29 NOTE — Progress Notes (Signed)
HPI: FU syncope, ASDand atrial fibrillation. Patient with history of syncope felt secondary to autonomic dysfunction. Previously loop monitor considered; Dr Lovena Le felt it would be indicated if recurrent syncope. H/O pulmonary embolus; seen by pulmonary and chronic anticoagulation felt indicated. Peripheral vascular disease followed by Dr. Scot Dock. Echocardiogram November 2018 showed normal LV function, biatrial enlargement and moderate tricuspid regurgitation. Holter monitor November 2018 showed atrial fibrillation with PVCs or aberrantly conducted beats rate controlled.Patient previously placed on amiodarone and underwent successful cardioversion to sinus rhythm February 2019. Echocardiogram August 2020 showed normal LV function, severe RV dysfunction, severe right ventricular enlargement, moderate left atrial enlargement, severe right atrial enlargement, severe tricuspid regurgitation, mild aortic insufficiency.  Follow-up study September 2020 showed moderate to large secundum atrial septal defect with bidirectional shunting.  Since last seen,patient has had worsening bilateral lower extremity edema (her Lasix was decreased because of worsening renal insufficiency).  She has mild dyspnea on exertion and some orthopnea.  No chest pain, palpitations, syncope or bleeding.  Current Outpatient Medications  Medication Sig Dispense Refill  . acetaminophen (TYLENOL) 650 MG CR tablet Take 1,300 mg by mouth every 8 (eight) hours as needed for pain.    Marland Kitchen amiodarone (PACERONE) 200 MG tablet TAKE ONE TABLET TWICE DAILY FOR ONE WEEK THEN DECREASE TO 1 TABLET DAILY (Patient taking differently: Take 200 mg by mouth daily. ) 135 tablet 2  . atorvastatin (LIPITOR) 20 MG tablet Take 1 tablet (20 mg total) by mouth daily at 6 PM. 90 tablet 3  . furosemide (LASIX) 20 MG tablet Take 20 mg by mouth daily.    Marland Kitchen HYDROcodone-acetaminophen (NORCO/VICODIN) 5-325 MG tablet Take 1 tablet by mouth every 6 (six) hours as  needed for severe pain.    Marland Kitchen HYDROcodone-acetaminophen (NORCO/VICODIN) 5-325 MG tablet Take 1-2 tablets by mouth every 4 (four) hours as needed for severe pain ((score 7 to 10)). 30 tablet 0  . levothyroxine (SYNTHROID) 25 MCG tablet Take 1 tablet (25 mcg total) by mouth daily. 90 tablet 3  . methocarbamol (ROBAXIN) 500 MG tablet Take 1 tablet (500 mg total) by mouth every 6 (six) hours as needed for muscle spasms. 60 tablet 1  . metoprolol succinate (TOPROL-XL) 25 MG 24 hr tablet TAKE 1 TABLET BY MOUTH EVERY DAY (Patient taking differently: Take 25 mg by mouth daily. ) 90 tablet 3  . amLODipine (NORVASC) 10 MG tablet Take 1 tablet (10 mg total) by mouth daily. 90 tablet 3   No current facility-administered medications for this visit.      Past Medical History:  Diagnosis Date  . Arthritis   . BREAST BIOPSY, HX OF 06/10/2006  . Breast cyst   . Dysrhythmia   . HIP PAIN, LEFT 01/31/2007  . HYPERLIPIDEMIA 06/10/2006  . HYPERTENSION 06/10/2006  . Impaired glucose tolerance   . Lower extremity edema   . NEOPLASMS UNSPEC NATURE BONE SOFT TISSUE&SKIN 05/31/2008  . OSTEOPOROSIS 06/10/2006  . Pulmonary embolus [facility])     Past Surgical History:  Procedure Laterality Date  . ABDOMINAL HYSTERECTOMY    . APPENDECTOMY    . BREAST SURGERY     bx x2  . CARDIOVERSION N/A 03/11/2017   Procedure: CARDIOVERSION;  Surgeon: Sanda Klein, MD;  Location: [facility];  Service: Cardiovascular;  Laterality: N/A;  . CHOLECYSTECTOMY  1980  . TRANSFORAMINAL LUMBAR INTERBODY FUSION (TLIF) WITH PEDICLE SCREW FIXATION 1 LEVEL Left 10/27/2018   Procedure: Left Lumbar five-sacral one Transforaminal lumbar interbody fusion;  Surgeon: Erline Levine,  MD;  Location: [place];  Service: Neurosurgery;  Laterality: Left;    Social History   Socioeconomic History  . Marital status: Married    Spouse name: Not on file  . Number of children: 4  . Years of education: Not on file  . Highest education level: Not on file   Occupational History  . Not on file  Social Needs  . Financial resource strain: Not on file  . Food insecurity    Worry: Not on file    Inability: Not on file  . Transportation needs    Medical: Not on file    Non-medical: Not on file  Tobacco Use  . Smoking status: Never Smoker  . Smokeless tobacco: Current User    Types: Snuff  Substance and Sexual Activity  . Alcohol use: No    Alcohol/week: 0.0 standard drinks  . Drug use: No  . Sexual activity: Not Currently  Lifestyle  . Physical activity    Days per week: Not on file    Minutes per session: Not on file  . Stress: Not on file  Relationships  . Social Herbalist on phone: Not on file    Gets together: Not on file    Attends religious service: Not on file    Active member of club or organization: Not on file    Attends meetings of clubs or organizations: Not on file    Relationship status: Not on file  . Intimate partner violence    Fear of current or ex partner: Not on file    Emotionally abused: Not on file    Physically abused: Not on file    Forced sexual activity: Not on file  Other Topics Concern  . Not on file  Social History Narrative   She is retired    Worked in [employer] for 20 years    Married for 43 years    4 children - live locally.         Family History  Problem Relation Age of Onset  . Heart disease Mother        Pacemaker  . Heart disease Father   . Asthma Father   . Diabetes Father   . Cancer Maternal Aunt        breast  . Heart disease Sister   . Hyperlipidemia Sister   . Hypertension Sister   . Lung cancer Sister     ROS: Back and leg pain but no fevers or chills, productive cough, hemoptysis, dysphasia, odynophagia, melena, hematochezia, dysuria, hematuria, rash, seizure activity, claudication. Remaining systems are negative.  Physical Exam: Well-developed well-nourished in no acute distress.  Skin is warm and dry.  HEENT is normal.  Neck is supple.  Chest  is clear to auscultation with normal expansion.  Cardiovascular exam is regular rate and rhythm.  Abdominal exam nontender or distended. No masses palpated. Extremities show 3+ edema. neuro grossly intact  A/P  1 paroxysmal atrial fibrillation-patient is in sinus rhythm today.  Continue amiodarone at present dose.  Continue apixaban.  2 hypertension-blood pressure elevated; I am increasing diuretics for right-sided CHF.  We will follow and advance medications as needed.  3 history of pulmonary embolus-pulmonary previously recommended long-term anticoagulation.  Continue apixaban 2.5 mg twice daily.  4 hyperlipidemia-continue statin.  5 history of syncope-patient has had no recurrences.  We will consider an implantable loop in the future if needed.  6 peripheral vascular disease-continue statin.  7 tricuspid regurgitation-plan conservative  measures given patient's age.  8 atrial septal defect-I have reviewed the patient's ASD with she and her husband.  She has developed significant volume excess after reducing her diuretics.  I will increase Lasix to 60 mg daily for 3 days then 40 mg daily thereafter.  Check potassium and renal function in 1 week.  We discussed options for ASD, tricuspid regurgitation and right heart failure.  We do not think at 85 years old and significant back difficulties limiting mobility she would be a good candidate for surgery and she is in agreement stating she would not be agreeable for such a procedure.  We will therefore plan medical therapy.  9 acute on chronic right heart failure-diuretic adjustments as outlined above.  10 chronic stage III kidney disease-follow renal function closely with increasing Lasix.  She developed worsening renal insufficiency with diuresis previously.  Kirk Ruths, MD

## 2018-11-30 ENCOUNTER — Other Ambulatory Visit: Payer: Self-pay

## 2018-11-30 ENCOUNTER — Ambulatory Visit (INDEPENDENT_AMBULATORY_CARE_PROVIDER_SITE_OTHER): Payer: Medicare HMO | Admitting: Cardiology

## 2018-11-30 ENCOUNTER — Encounter: Payer: Self-pay | Admitting: Cardiology

## 2018-11-30 VITALS — BP 152/62 | HR 80 | Ht 67.0 in | Wt 186.0 lb

## 2018-11-30 DIAGNOSIS — I130 Hypertensive heart and chronic kidney disease with heart failure and stage 1 through stage 4 chronic kidney disease, or unspecified chronic kidney disease: Secondary | ICD-10-CM | POA: Diagnosis not present

## 2018-11-30 DIAGNOSIS — Q211 Atrial septal defect, unspecified: Secondary | ICD-10-CM

## 2018-11-30 DIAGNOSIS — I50813 Acute on chronic right heart failure: Secondary | ICD-10-CM | POA: Diagnosis not present

## 2018-11-30 DIAGNOSIS — I10 Essential (primary) hypertension: Secondary | ICD-10-CM | POA: Diagnosis not present

## 2018-11-30 DIAGNOSIS — I079 Rheumatic tricuspid valve disease, unspecified: Secondary | ICD-10-CM

## 2018-11-30 DIAGNOSIS — I480 Paroxysmal atrial fibrillation: Secondary | ICD-10-CM

## 2018-11-30 DIAGNOSIS — I272 Pulmonary hypertension, unspecified: Secondary | ICD-10-CM

## 2018-11-30 DIAGNOSIS — I739 Peripheral vascular disease, unspecified: Secondary | ICD-10-CM | POA: Diagnosis not present

## 2018-11-30 DIAGNOSIS — N183 Chronic kidney disease, stage 3 unspecified: Secondary | ICD-10-CM | POA: Diagnosis not present

## 2018-11-30 NOTE — Patient Instructions (Signed)
Medication Instructions:  INCREASE FUROSEMIDE TO 60 MG ONCE DAILY X 3 DAYS AND THEN TAKE 40 MG ONCE DAILY  *If you need a refill on your cardiac medications before your next appointment, please call your pharmacy*  Lab Work: Your physician recommends that you return for lab work in: [place]  If you have labs (blood work) drawn today and your tests are completely normal, you will receive your results only by: Marland Kitchen MyChart Message (if you have MyChart) OR . A paper copy in the mail If you have any lab test that is abnormal or we need to change your treatment, we will call you to review the results.  Follow-Up: At [facility], you and your health needs are our priority.  As part of our continuing mission to provide you with exceptional heart care, we have created designated Provider Care Teams.  These Care Teams include your primary Cardiologist (physician) and Advanced Practice Providers (APPs -  Physician Assistants and Nurse Practitioners) who all work together to provide you with the care you need, when you need it.  Your physician recommends that you schedule a follow-up appointment in: [place] APP-IN PERSON  Your physician recommends that you schedule a follow-up appointment in: [place]

## 2018-12-05 NOTE — Progress Notes (Signed)
Cardiology Clinic Note   Patient Name: Tammy [name] Date of Encounter: 12/06/2018  Primary Care Provider:  [name], NP Primary Cardiologist:  Kirk Ruths, MD  Patient Profile    Tammy [name].  [age] year old female presents today for follow-up of her ASD, right heart failure and fluid volume excess.  Past Medical History    Past Medical History:  Diagnosis Date  . Arthritis   . BREAST BIOPSY, HX OF 06/10/2006  . Breast cyst   . Dysrhythmia   . HIP PAIN, LEFT 01/31/2007  . HYPERLIPIDEMIA 06/10/2006  . HYPERTENSION 06/10/2006  . Impaired glucose tolerance   . Lower extremity edema   . NEOPLASMS UNSPEC NATURE BONE SOFT TISSUE&SKIN 05/31/2008  . OSTEOPOROSIS 06/10/2006  . Pulmonary embolus [facility])    Past Surgical History:  Procedure Laterality Date  . ABDOMINAL HYSTERECTOMY    . APPENDECTOMY    . BREAST SURGERY     bx x2  . CARDIOVERSION N/A 03/11/2017   Procedure: CARDIOVERSION;  Surgeon: Sanda Klein, MD;  Location: [place] [facility];  Service: Cardiovascular;  Laterality: N/A;  . CHOLECYSTECTOMY  1980  . TRANSFORAMINAL LUMBAR INTERBODY FUSION (TLIF) WITH PEDICLE SCREW FIXATION 1 LEVEL Left 10/27/2018   Procedure: Left Lumbar five-sacral one Transforaminal lumbar interbody fusion;  Surgeon: Erline Levine, MD;  Location: [place];  Service: Neurosurgery;  Laterality: Left;    Allergies  Allergies  Allergen Reactions  . Tuberculin Tests Other (See Comments)    Tuberculosis shot, red spot on arm    History of Present Illness    Ms. [name] has a past medical history of syncope, ASD, pulmonary embolus, peripheral vascular disease, and atrial fibrillation.  Her past syncope was felt to be due to autonomic dysfunction.  An implantable loop recorder was considered by Dr. Lovena Le however, she has not had recurrent symptoms.  Her echocardiogram from 11/2016 showed normal LV function, bilateral enlargement and moderate tricuspid regurgitation.  A Holter monitor study from 11/2016  showed atrial fibrillation with PVCs or aberrantly conducted beats that were rate controlled.  She was previously placed on amiodarone and underwent a successful cardioversion to NSR on 02/2017.  An echocardiogram from 08/2018 showed normal LV function, severe RV dysfunction, severe right ventricular enlargement, moderate left atrial enlargement, severe right atrial enlargement, severe tricuspid regurgitation and mild aortic insufficiency.  A follow-up echocardiogram 09/2018 showed moderate to large ASD with bidirectional shunting.  Since that time she has noticed increased bilateral lower extremity edema (her Lasix was decreased due to worsening renal insufficiency).  She was last seen by Dr. Stanford Breed on 11/30/2018.  During that time she was having mild dyspnea on exertion and some orthopnea was noted.  She denied chest pain, palpitations, syncope or bleeding.  Her Lasix was increased to 60 mg daily x3 days and then 40 mg daily.  She presents today for follow-up and a BMP.  She states she feels better than she did last week when she was seen in the clinic.  She states her breathing has returned to normal.  She feels like she has much less lower extremity edema.  She endorses eating convenience foods such as potato chips and processed meats.  I will give him salty 6 she and follow-up in 1 month to repeat BMP.  Maintain 40 mg Lasix daily.  She denies chest pain, shortness of breath,  fatigue, palpitations, melena, hematuria, hemoptysis, diaphoresis, weakness, presyncope, syncope, orthopnea, and PND.   Home Medications    Prior to Admission medications   Medication  Sig Start Date End Date Taking? Authorizing Provider  acetaminophen (TYLENOL) 650 MG CR tablet Take 1,300 mg by mouth every 8 (eight) hours as needed for pain.    [provider]  amiodarone (PACERONE) 200 MG tablet TAKE ONE TABLET TWICE DAILY FOR ONE WEEK THEN DECREASE TO 1 TABLET DAILY Patient taking differently: Take 200 mg by mouth  daily.  04/26/18   Lelon Perla, MD  amLODipine (NORVASC) 10 MG tablet Take 1 tablet (10 mg total) by mouth daily. 03/29/18 10/20/18  Nafziger, Tommi Rumps, NP  atorvastatin (LIPITOR) 20 MG tablet Take 1 tablet (20 mg total) by mouth daily at 6 PM. 03/29/18   Nafziger, Tommi Rumps, NP  furosemide (LASIX) 20 MG tablet Take 20 mg by mouth daily. 09/18/18   [provider]  HYDROcodone-acetaminophen (NORCO/VICODIN) 5-325 MG tablet Take 1 tablet by mouth every 6 (six) hours as needed for severe pain.    [provider]  HYDROcodone-acetaminophen (NORCO/VICODIN) 5-325 MG tablet Take 1-2 tablets by mouth every 4 (four) hours as needed for severe pain ((score 7 to 10)). 10/28/18   Erline Levine, MD  levothyroxine (SYNTHROID) 25 MCG tablet Take 1 tablet (25 mcg total) by mouth daily. 08/25/18   Nafziger, Tommi Rumps, NP  methocarbamol (ROBAXIN) 500 MG tablet Take 1 tablet (500 mg total) by mouth every 6 (six) hours as needed for muscle spasms. 10/28/18   Erline Levine, MD  metoprolol succinate (TOPROL-XL) 25 MG 24 hr tablet TAKE 1 TABLET BY MOUTH EVERY DAY Patient taking differently: Take 25 mg by mouth daily.  04/01/18   [name], NP    Family History    Family History  Problem Relation Age of Onset  . Heart disease Mother        Pacemaker  . Heart disease Father   . Asthma Father   . Diabetes Father   . Cancer Maternal Aunt        breast  . Heart disease Sister   . Hyperlipidemia Sister   . Hypertension Sister   . Lung cancer Sister    She indicated that her mother is deceased. She indicated that her father is deceased. She indicated that the status of her sister is unknown. She indicated that her maternal grandmother is deceased. She indicated that her maternal grandfather is deceased. She indicated that her paternal grandmother is deceased. She indicated that her paternal grandfather is deceased. She indicated that her maternal aunt is deceased.  Social History    Social History    Socioeconomic History  . Marital status: Married    Spouse name: Not on file  . Number of children: 4  . Years of education: Not on file  . Highest education level: Not on file  Occupational History  . Not on file  Social Needs  . Financial resource strain: Not on file  . Food insecurity    Worry: Not on file    Inability: Not on file  . Transportation needs    Medical: Not on file    Non-medical: Not on file  Tobacco Use  . Smoking status: Never Smoker  . Smokeless tobacco: Current User    Types: Snuff  Substance and Sexual Activity  . Alcohol use: No    Alcohol/week: 0.0 standard drinks  . Drug use: No  . Sexual activity: Not Currently  Lifestyle  . Physical activity    Days per week: Not on file    Minutes per session: Not on file  . Stress: Not on file  Relationships  . Social Herbalist on phone: Not on file    Gets together: Not on file    Attends religious service: Not on file    Active member of club or organization: Not on file    Attends meetings of clubs or organizations: Not on file    Relationship status: Not on file  . Intimate partner violence    Fear of current or ex partner: Not on file    Emotionally abused: Not on file    Physically abused: Not on file    Forced sexual activity: Not on file  Other Topics Concern  . Not on file  Social History Narrative   She is retired    Worked in [employer] for 20 years    Married for 43 years    4 children - live locally.          Review of Systems    General:  No chills, fever, night sweats or weight changes.  Cardiovascular:  No chest pain, dyspnea on exertion, edema, orthopnea, palpitations, paroxysmal nocturnal dyspnea. Dermatological: No rash, lesions/masses Respiratory: No cough, dyspnea Urologic: No hematuria, dysuria Abdominal:   No nausea, vomiting, diarrhea, bright red blood per rectum, melena, or hematemesis Neurologic:  No visual changes, wkns, changes in mental status. All  other systems reviewed and are otherwise negative except as noted above.  Physical Exam    VS:  BP 128/60   Pulse (!) 55   Ht 5\' 7"  (1.702 m)   Wt 178 lb (80.7 kg)   LMP  (LMP Unknown)   SpO2 98%   BMI 27.88 kg/m  , BMI Body mass index is 27.88 kg/m. GEN: Well nourished, well developed, in no acute distress. HEENT: normal. Neck: Supple, no JVD, carotid bruits, or masses. Cardiac: RRR, no murmurs, rubs, or gallops. No clubbing, cyanosis, bilateral lower extremity +1 pitting to knee edema.  Radials/DP/PT 2+ and equal bilaterally.  Respiratory:  Respirations regular and unlabored, clear to auscultation bilaterally. GI: Soft, nontender, nondistended, BS + x 4. MS: no deformity or atrophy. Skin: warm and dry, no rash. Neuro:  Strength and sensation are intact. Psych: Normal affect.  Accessory Clinical Findings    ECG personally reviewed by me today-none today  EKG 09/25/2018 Sinus bradycardia with first-degree AV block right bundle branch block 52 bpm  Echocardiogram 09/30/2018 IMPRESSIONS    1. The left ventricle has low normal systolic function, with an ejection fraction of 50-55%. The cavity size was normal. Left ventricular diastolic parameters were normal. There is right ventricular volume overload.  2. The right ventricle has normal systolc function. The cavity was severely enlarged. There is no increase in right ventricular wall thickness. Right ventricular systolic pressure is severely elevated with an estimated pressure of 64.2 mmHg.  3. Left atrial size was mildly dilated.  4. Right atrial size was severely dilated.  5. Tricuspid valve regurgitation is severe.  6. There is severe tricuspid annular dilation with leaflet malcoaptation.  7. Aortic valve regurgitation is trivial by color flow Doppler.  8. The aortic root and ascending aorta are normal in size and structure.  9. Moderate to large secundum atrial septal defect with bidirectional shunting across atrial septum.  Imaging is not optimal but the defect diameter is approximately 12 mm. 10. There is predominantly left-to-right shunt, but there is severe right-to-left shunting during strain/cough. This is seen both by color Doppler and saline contrast injection. Shunt fraction cannot be calculated due to technically poor  images of the  RVoutflow and pulmonary artery.  Echocardiogram 09/26/2018 IMPRESSIONS    1. The left ventricle has low normal systolic function, with an ejection fraction of 50-55%. The cavity size was normal. Left ventricular diastolic Doppler parameters are indeterminate.  2. Abnormal septal motion.  3. The right ventricle has severely reduced systolic function. The cavity was severely enlarged. There is not assessed.  4. Left atrial size was moderately dilated.  5. Color flow suggests significant PFO or ASD but overall septum not well visualized Given RV enlargement would bring patient back for f/u limited study to evaluated atrial septum with bubble study.  6. Right atrial size was severely dilated.  7. Mild thickening of the mitral valve leaflet.  8. Tricuspid valve regurgitation is severe.  9. The aortic valve is tricuspid. Mild thickening of the aortic valve. Aortic valve regurgitation is mild by color flow Doppler. 10. The aorta is normal unless otherwise noted. 11. The interatrial septum was not well visualized.  Assessment & Plan   1.  Atrial septal defect-defect has been reviewed with patient and her husband.  Given her age and limited mobility she would not be a good candidate for surgery.  Medical management has been discussed and is the agreed upon plan of care. Continue 40 mg Lasix daily Order BMP today and in one month.  Start daily weights and keep weight log, bring to next appointment.  Paroxysmal atrial fibrillation-heart rate today 55bpm Continue amiodarone 200 mg daily Continue metoprolol succinate 25 mg tablet daily  Essential hypertension-blood pressure today  128/60 Continue metoprolol succinate 25 mg tablet daily Heart healthy low-sodium diet-salty 6 given  Hyperlipidemia-03/11/2018: Cholesterol 121; HDL 61.80; LDL Cholesterol 41; Triglycerides 91.0; VLDL 18.2 Continue atorvastatin 20 mg daily Increase physical activity as tolerated Heart healthy low-sodium high-fiber diet  History of syncopal events-no recent events. Consider implantable loop recorder if needed in the future.  Disposition: Follow-up with App in one month.  Jossie Ng. [facility] [address] Office [phone] Fax [phone]

## 2018-12-06 ENCOUNTER — Encounter: Payer: Self-pay | Admitting: General Practice

## 2018-12-06 ENCOUNTER — Ambulatory Visit (INDEPENDENT_AMBULATORY_CARE_PROVIDER_SITE_OTHER): Payer: Medicare HMO | Admitting: General Practice

## 2018-12-06 ENCOUNTER — Other Ambulatory Visit: Payer: Self-pay

## 2018-12-06 VITALS — BP 128/60 | HR 55 | Ht 67.0 in | Wt 178.0 lb

## 2018-12-06 DIAGNOSIS — Z23 Encounter for immunization: Secondary | ICD-10-CM

## 2018-12-06 DIAGNOSIS — Q211 Atrial septal defect, unspecified: Secondary | ICD-10-CM

## 2018-12-06 DIAGNOSIS — E7800 Pure hypercholesterolemia, unspecified: Secondary | ICD-10-CM

## 2018-12-06 DIAGNOSIS — I272 Pulmonary hypertension, unspecified: Secondary | ICD-10-CM | POA: Diagnosis not present

## 2018-12-06 DIAGNOSIS — R55 Syncope and collapse: Secondary | ICD-10-CM

## 2018-12-06 DIAGNOSIS — I10 Essential (primary) hypertension: Secondary | ICD-10-CM

## 2018-12-06 DIAGNOSIS — I480 Paroxysmal atrial fibrillation: Secondary | ICD-10-CM

## 2018-12-06 LAB — BASIC METABOLIC PANEL
BUN/Creatinine Ratio: 18 (ref 12–28)
BUN: 35 mg/dL — ABNORMAL HIGH (ref 8–27)
CO2: 21 mmol/L (ref 20–29)
Calcium: 10.2 mg/dL (ref 8.7–10.3)
Chloride: 99 mmol/L (ref 96–106)
Creatinine, Ser: 1.93 mg/dL — ABNORMAL HIGH (ref 0.57–1.00)
GFR calc Af Amer: 27 mL/min/{1.73_m2} — ABNORMAL LOW (ref >59)
GFR calc non Af Amer: 23 mL/min/{1.73_m2} — ABNORMAL LOW (ref >59)
Glucose: 112 mg/dL — ABNORMAL HIGH (ref 65–99)
Potassium: 4.6 mmol/L (ref 3.5–5.2)
Sodium: 140 mmol/L (ref 134–144)

## 2018-12-06 MED ORDER — FUROSEMIDE 40 MG PO TABS: 40.0000 mg | ORAL_TABLET | Freq: Every day | ORAL | 3 refills | Status: DC

## 2018-12-06 NOTE — Patient Instructions (Signed)
Medication Instructions:  Continue current medications  *If you need a refill on your cardiac medications before your next appointment, please call your pharmacy*  Lab Work: None Ordered  Testing/Procedures: None Ordered  Follow-Up: At [facility], you and your health needs are our priority.  As part of our continuing mission to provide you with exceptional heart care, we have created designated Provider Care Teams.  These Care Teams include your primary Cardiologist (physician) and Advanced Practice Providers (APPs -  Physician Assistants and Nurse Practitioners) who all work together to provide you with the care you need, when you need it.  Your next appointment:   December 17th @ 1:30 pm  The format for your next appointment:   In Person  Provider:   Coletta Memos, FNP

## 2018-12-12 DIAGNOSIS — M545 Low back pain: Secondary | ICD-10-CM | POA: Diagnosis not present

## 2018-12-12 DIAGNOSIS — M4316 Spondylolisthesis, lumbar region: Secondary | ICD-10-CM | POA: Diagnosis not present

## 2018-12-12 DIAGNOSIS — M5416 Radiculopathy, lumbar region: Secondary | ICD-10-CM | POA: Diagnosis not present

## 2018-12-12 DIAGNOSIS — M5126 Other intervertebral disc displacement, lumbar region: Secondary | ICD-10-CM | POA: Diagnosis not present

## 2019-01-02 ENCOUNTER — Other Ambulatory Visit: Payer: Self-pay | Admitting: *Deleted

## 2019-01-11 NOTE — Progress Notes (Signed)
Cardiology Clinic Note   Patient Name: Tammy [name] Date of Encounter: 01/12/2019  Primary Care Provider:  [name], NP Primary Cardiologist:  Tammy Ruths, MD  Patient Profile    Tammy [name]. [age] year old female presents today for follow-up of her essential hypertension, syncope, peripheral arterial disease, tricuspid regurgitation,ASD, right heart failure and fluid volume excess.  Past Medical History    Past Medical History:  Diagnosis Date  . Arthritis   . BREAST BIOPSY, HX OF 06/10/2006  . Breast cyst   . Dysrhythmia   . HIP PAIN, LEFT 01/31/2007  . HYPERLIPIDEMIA 06/10/2006  . HYPERTENSION 06/10/2006  . Impaired glucose tolerance   . Lower extremity edema   . NEOPLASMS UNSPEC NATURE BONE SOFT TISSUE&SKIN 05/31/2008  . OSTEOPOROSIS 06/10/2006  . Pulmonary embolus [facility])    Past Surgical History:  Procedure Laterality Date  . ABDOMINAL HYSTERECTOMY    . APPENDECTOMY    . BREAST SURGERY     bx x2  . CARDIOVERSION N/A 03/11/2017   Procedure: CARDIOVERSION;  Surgeon: Tammy Klein, MD;  Location: [facility] [facility];  Service: Cardiovascular;  Laterality: N/A;  . CHOLECYSTECTOMY  1980  . TRANSFORAMINAL LUMBAR INTERBODY FUSION (TLIF) WITH PEDICLE SCREW FIXATION 1 LEVEL Left 10/27/2018   Procedure: Left Lumbar five-sacral one Transforaminal lumbar interbody fusion;  Surgeon: Tammy Levine, MD;  Location: [place];  Service: Neurosurgery;  Laterality: Left;    Allergies  Allergies  Allergen Reactions  . Tuberculin Tests Other (See Comments)    Tuberculosis shot, red spot on arm    History of Present Illness    Tammy [name] has a past medical history of syncope, ASD, pulmonary embolus, peripheral vascular disease, and atrial fibrillation.  Her past syncope was felt to be due to autonomic dysfunction.  An implantable loop recorder was considered by Tammy [name] however, she has not had recurrent symptoms.  Her echocardiogram from 11/2016 showed normal LV function, bilateral  enlargement and moderate tricuspid regurgitation.  A Holter monitor study from 11/2016 showed atrial fibrillation with PVCs or aberrantly conducted beats that were rate controlled.  She was previously placed on amiodarone and underwent a successful cardioversion to NSR on 02/2017.  An echocardiogram from 08/2018 showed normal LV function, severe RV dysfunction, severe right ventricular enlargement, moderate left atrial enlargement, severe right atrial enlargement, severe tricuspid regurgitation and mild aortic insufficiency.  A follow-up echocardiogram 09/2018 showed moderate to large ASD with bidirectional shunting.  Since that time she has noticed increased bilateral lower extremity edema (her Lasix was decreased due to worsening renal insufficiency).  She was last seen by Tammy [name] on 11/30/2018.  During that time she was having mild dyspnea on exertion and some orthopnea was noted.  She denied chest pain, palpitations, syncope or bleeding.  Her Lasix was increased to 60 mg daily x3 days and then 40 mg daily.  She presents today for follow-up and a BMP.  She was seen by me on 12/06/2018 and stated she felt better than she did the previous week when she was seen in the clinic.  She stated her breathing had returned to normal.  She felt like she had much less lower extremity edema.  She endorsed eating convenience foods such as potato chips and processed meats.  I  gave her the salty 6 sheet and asked her to follow-up in 1 month to repeat BMP.   Her 40 mg Lasix daily was maintained.  She presents the clinic today and states her lower extremity swelling is much  improved.  She feels like she can finally wear shoes again.  As far as physical activity goes she feels like she is very limited since her back surgery and fatigues easily.  She continues to follow a low-sodium diet.  She states she has some lower extremity support stockings but is unable to get them on at this time.  She continues to be compliant with  her Eliquis, give her a patient assistance card today.  She states she has some anxiety since her back surgery and COVID-19.  I have instructed her to follow-up with her PCP regarding anxiety/depression medication.  I instructed her to get some less restrictive support stockings to help with her lower extremity edema.  We will check her BMP today and have her increase her activity slowly to build up her fitness.  She denies chest pain, shortness of breath, fatigue, palpitations, melena, hematuria, hemoptysis, diaphoresis, weakness, presyncope, syncope, orthopnea, and PND.   Home Medications    Prior to Admission medications   Medication Sig Start Date End Date Taking? Authorizing Provider  acetaminophen (TYLENOL) 650 MG CR tablet Take 1,300 mg by mouth every 8 (eight) hours as needed for pain.    [provider]  amiodarone (PACERONE) 200 MG tablet TAKE ONE TABLET TWICE DAILY FOR ONE WEEK THEN DECREASE TO 1 TABLET DAILY Patient taking differently: Take 200 mg by mouth daily.  04/26/18   Lelon Perla, MD  amLODipine (NORVASC) 10 MG tablet Take 1 tablet (10 mg total) by mouth daily. 03/29/18 12/06/18  Tammy [name], Tommi Rumps, NP  apixaban (ELIQUIS) 2.5 MG TABS tablet Take 1 tablet (2.5 mg total) by mouth 2 (two) times daily. 01/02/19   Lelon Perla, MD  atorvastatin (LIPITOR) 20 MG tablet Take 1 tablet (20 mg total) by mouth daily at 6 PM. 03/29/18   Tammy [name], Tommi Rumps, NP  furosemide (LASIX) 40 MG tablet Take 1 tablet (40 mg total) by mouth daily. 12/06/18 03/06/19  Deberah Pelton, NP  HYDROcodone-acetaminophen (NORCO/VICODIN) 5-325 MG tablet Take 1-2 tablets by mouth every 4 (four) hours as needed for severe pain ((score 7 to 10)). 10/28/18   Tammy Levine, MD  levothyroxine (SYNTHROID) 25 MCG tablet Take 1 tablet (25 mcg total) by mouth daily. 08/25/18   Tammy [name], Tommi Rumps, NP  methocarbamol (ROBAXIN) 500 MG tablet Take 1 tablet (500 mg total) by mouth every 6 (six) hours as needed for muscle spasms.  10/28/18   Tammy Levine, MD  metoprolol succinate (TOPROL-XL) 25 MG 24 hr tablet TAKE 1 TABLET BY MOUTH EVERY DAY Patient taking differently: Take 25 mg by mouth daily.  04/01/18   Tammy Peng, NP    Family History    Family History  Problem Relation Age of Onset  . Heart disease Mother        Pacemaker  . Heart disease Father   . Asthma Father   . Diabetes Father   . Cancer Maternal Aunt        breast  . Heart disease Sister   . Hyperlipidemia Sister   . Hypertension Sister   . Lung cancer Sister    She indicated that her mother is deceased. She indicated that her father is deceased. She indicated that the status of her sister is unknown. She indicated that her maternal grandmother is deceased. She indicated that her maternal grandfather is deceased. She indicated that her paternal grandmother is deceased. She indicated that her paternal grandfather is deceased. She indicated that her maternal aunt is deceased.  Social History  Social History   Socioeconomic History  . Marital status: Married    Spouse name: Not on file  . Number of children: 4  . Years of education: Not on file  . Highest education level: Not on file  Occupational History  . Not on file  Tobacco Use  . Smoking status: Never Smoker  . Smokeless tobacco: Current User    Types: Snuff  Substance and Sexual Activity  . Alcohol use: No    Alcohol/week: 0.0 standard drinks  . Drug use: No  . Sexual activity: Not Currently  Other Topics Concern  . Not on file  Social History Narrative   She is retired    Worked in [employer] for 20 years    Married for 43 years    4 children - live locally.        Social Determinants of Health   Financial Resource Strain:   . Difficulty of Paying Living Expenses: Not on file  Food Insecurity:   . Worried About Charity fundraiser in the Last Year: Not on file  . Ran Out of Food in the Last Year: Not on file  Transportation Needs:   . Lack of Transportation  (Medical): Not on file  . Lack of Transportation (Non-Medical): Not on file  Physical Activity:   . Days of Exercise per Week: Not on file  . Minutes of Exercise per Session: Not on file  Stress:   . Feeling of Stress : Not on file  Social Connections:   . Frequency of Communication with Friends and Family: Not on file  . Frequency of Social Gatherings with Friends and Family: Not on file  . Attends Religious Services: Not on file  . Active Member of Clubs or Organizations: Not on file  . Attends Archivist Meetings: Not on file  . Marital Status: Not on file  Intimate Partner Violence:   . Fear of Current or Ex-Partner: Not on file  . Emotionally Abused: Not on file  . Physically Abused: Not on file  . Sexually Abused: Not on file     Review of Systems    General:  No chills, fever, night sweats or weight changes.  Cardiovascular:  No chest pain, dyspnea on exertion, edema, orthopnea, palpitations, paroxysmal nocturnal dyspnea. Dermatological: No rash, lesions/masses Respiratory: No cough, dyspnea Urologic: No hematuria, dysuria Abdominal:   No nausea, vomiting, diarrhea, bright red blood per rectum, melena, or hematemesis Neurologic:  No visual changes, wkns, changes in mental status. All other systems reviewed and are otherwise negative except as noted above.  Physical Exam    VS:  BP 130/89   Pulse (!) 58   Temp 97.7 F (36.5 C)   Ht 5\' 7"  (1.702 m)   Wt 170 lb 6.4 oz (77.3 kg)   LMP  (LMP Unknown)   SpO2 96%   BMI 26.69 kg/m  , BMI Body mass index is 26.69 kg/m. GEN: Well nourished, well developed, in no acute distress. HEENT: normal. Neck: Supple, no JVD, carotid bruits, or masses. Cardiac: RRR, no murmurs, rubs, or gallops. No clubbing, cyanosis, bilateral lower extremity generalized edema up to her shin.  Radials/DP/PT 2+ and equal bilaterally.  Respiratory:  Respirations regular and unlabored, clear to auscultation bilaterally. GI: Soft, nontender,  nondistended, BS + x 4. MS: no deformity or atrophy. Skin: warm and dry, no rash. Neuro:  Strength and sensation are intact. Psych: Normal affect.  Accessory Clinical Findings    ECG personally reviewed  by me today- none today - No acute changes  EKG 09/25/2018 Sinus bradycardia with first-degree AV block right bundle branch block 52 bpm  Echocardiogram 09/30/2018 IMPRESSIONS   1. The left ventricle has low normal systolic function, with an ejection fraction of 50-55%. The cavity size was normal. Left ventricular diastolic parameters were normal. There is right ventricular volume overload. 2. The right ventricle has normal systolc function. The cavity was severely enlarged. There is no increase in right ventricular wall thickness. Right ventricular systolic pressure is severely elevated with an estimated pressure of 64.2 mmHg. 3. Left atrial size was mildly dilated. 4. Right atrial size was severely dilated. 5. Tricuspid valve regurgitation is severe. 6. There is severe tricuspid annular dilation with leaflet malcoaptation. 7. Aortic valve regurgitation is trivial by color flow Doppler. 8. The aortic root and ascending aorta are normal in size and structure. 9. Moderate to large secundum atrial septal defect with bidirectional shunting across atrial septum. Imaging is not optimal but the defect diameter is approximately 12 mm. 10. There is predominantly left-to-right shunt, but there is severe right-to-left shunting during strain/cough. This is seen both by color Doppler and saline contrast injection. Shunt fraction cannot be calculated due to technically poor images of the  RVoutflow and pulmonary artery.  Echocardiogram 09/26/2018 IMPRESSIONS   1. The left ventricle has low normal systolic function, with an ejection fraction of 50-55%. The cavity size was normal. Left ventricular diastolic Doppler parameters are indeterminate. 2. Abnormal septal motion. 3. The right  ventricle has severely reduced systolic function. The cavity was severely enlarged. There is not assessed. 4. Left atrial size was moderately dilated. 5. Color flow suggests significant PFO or ASD but overall septum not well visualized Given RV enlargement would bring patient back for f/u limited study to evaluated atrial septum with bubble study. 6. Right atrial size was severely dilated. 7. Mild thickening of the mitral valve leaflet. 8. Tricuspid valve regurgitation is severe. 9. The aortic valve is tricuspid. Mild thickening of the aortic valve. Aortic valve regurgitation is mild by color flow Doppler. 10. The aorta is normal unless otherwise noted. 11. The interatrial septum was not well visualized.  Assessment & Plan   1.  Atrial septal defect-defect has been reviewed with patient and her husband.  Given her age and limited mobility she would not be a good candidate for surgery.  Medical management has been discussed and is the agreed upon plan of care.(Weight today 170 pounds) Continue 40 mg Lasix daily Order BMP  Continue daily weights and keep weight log, bring to next appointment.    Paroxysmal atrial fibrillation-heart rate today 58 bpm Continue amiodarone 200 mg daily Continue metoprolol succinate 25 mg tablet daily Continue Eliquis 2.5 mg twice daily  Essential hypertension-blood pressure today  130/89.  Well-controlled at home Continue metoprolol succinate 25 mg tablet daily Heart healthy low-sodium diet-salty 6 given  Hyperlipidemia-03/11/2018: Cholesterol 121; HDL 61.80; LDL Cholesterol 41; Triglycerides 91.0; VLDL 18.2 Continue atorvastatin 20 mg daily Increase physical activity as tolerated Heart healthy low-sodium high-fiber diet  History of syncopal events-no recent events. Consider implantable loop recorder if needed in the future.   Disposition: Follow-up with Tammy [name] in 3 months.  Jossie Ng. [facility]  [facility] [address] Office [phone] Fax [phone]

## 2019-01-12 ENCOUNTER — Encounter: Payer: Self-pay | Admitting: General Practice

## 2019-01-12 ENCOUNTER — Other Ambulatory Visit: Payer: Self-pay

## 2019-01-12 ENCOUNTER — Ambulatory Visit (INDEPENDENT_AMBULATORY_CARE_PROVIDER_SITE_OTHER): Payer: Medicare HMO | Admitting: General Practice

## 2019-01-12 VITALS — BP 130/89 | HR 58 | Temp 97.7°F | Ht 67.0 in | Wt 170.4 lb

## 2019-01-12 DIAGNOSIS — Z79899 Other long term (current) drug therapy: Secondary | ICD-10-CM

## 2019-01-12 DIAGNOSIS — I10 Essential (primary) hypertension: Secondary | ICD-10-CM | POA: Diagnosis not present

## 2019-01-12 DIAGNOSIS — Q211 Atrial septal defect, unspecified: Secondary | ICD-10-CM

## 2019-01-12 DIAGNOSIS — E785 Hyperlipidemia, unspecified: Secondary | ICD-10-CM

## 2019-01-12 DIAGNOSIS — R55 Syncope and collapse: Secondary | ICD-10-CM

## 2019-01-12 DIAGNOSIS — I480 Paroxysmal atrial fibrillation: Secondary | ICD-10-CM

## 2019-01-12 NOTE — Patient Instructions (Signed)
Medication Instructions:  ASK YOUR PCP ABOUT LEXAPRO RX If you need a refill on your cardiac medications before your next appointment, please call your pharmacy.  Labwork: BMET TODAY HERE IN OUR OFFICE AT [facility]   You will need to fast. DO NOT EAT OR DRINK PAST MIDNIGHT.     You will NOT need to fast   If you have labs (blood work) drawn today and your tests are completely normal, you will receive your results only by: Marland Kitchen MyChart Message (if you have MyChart) OR . A paper copy in the mail If you have any lab test that is abnormal or we need to change your treatment, we will call you to review the results.  Special Instructions: PLEASE PURCHASE AND WEAR COMPRESSION STOCKINGS(OR COPPER FIT STOCKINGS) DAILY AND OFF AT BEDTIME. Compression stockings are elastic socks that squeeze the legs. They help to increase blood flow to the legs and to decrease swelling in the legs from fluid retention, and reduce the chance of developing blood clots in the lower legs.    Reduce your risk of getting COVID-19 With your heart disease it is especially important for people at increased risk of severe illness from COVID-19, and those who live with them, to protect themselves from getting COVID-19. The best way to protect yourself and to help reduce the spread of the virus that causes COVID-19 is to: Marland Kitchen Limit your interactions with other people as much as possible. . Take precautions to prevent getting COVID-19 when you do interact with others. If you start feeling sick and think you may have COVID-19, get in touch with your healthcare provider within 24 hours.  Follow-Up: IN 3 months  In Person You may see Kirk Ruths, MD or one of the following Advanced Practice Providers on your designated Care Team:  Coletta Memos, [place], PA-C [place], [place].    At [facility], you and your health needs are our priority.  As part of our continuing mission to provide you with exceptional heart care, we  have created designated Provider Care Teams.  These Care Teams include your primary Cardiologist (physician) and Advanced Practice Providers (APPs -  Physician Assistants and Nurse Practitioners) who all work together to provide you with the care you need, when you need it.  Thank you for choosing [facility] at [facility]!!     Happy Holidays!!

## 2019-01-13 LAB — BASIC METABOLIC PANEL
BUN/Creatinine Ratio: 19 (ref 12–28)
BUN: 34 mg/dL — ABNORMAL HIGH (ref 8–27)
CO2: 22 mmol/L (ref 20–29)
Calcium: 9.6 mg/dL (ref 8.7–10.3)
Chloride: 103 mmol/L (ref 96–106)
Creatinine, Ser: 1.76 mg/dL — ABNORMAL HIGH (ref 0.57–1.00)
GFR calc Af Amer: 30 mL/min/{1.73_m2} — ABNORMAL LOW (ref >59)
GFR calc non Af Amer: 26 mL/min/{1.73_m2} — ABNORMAL LOW (ref >59)
Glucose: 104 mg/dL — ABNORMAL HIGH (ref 65–99)
Potassium: 4.8 mmol/L (ref 3.5–5.2)
Sodium: 143 mmol/L (ref 134–144)

## 2019-01-16 ENCOUNTER — Other Ambulatory Visit: Payer: Self-pay | Admitting: Adult Health

## 2019-01-16 DIAGNOSIS — I272 Pulmonary hypertension, unspecified: Secondary | ICD-10-CM

## 2019-01-18 NOTE — Telephone Encounter (Signed)
Denied.  Pt is taking 40 mg

## 2019-01-23 DIAGNOSIS — M5416 Radiculopathy, lumbar region: Secondary | ICD-10-CM | POA: Diagnosis not present

## 2019-01-23 DIAGNOSIS — Z6827 Body mass index (BMI) 27.0-27.9, adult: Secondary | ICD-10-CM | POA: Diagnosis not present

## 2019-01-23 DIAGNOSIS — I10 Essential (primary) hypertension: Secondary | ICD-10-CM | POA: Diagnosis not present

## 2019-01-23 DIAGNOSIS — M5126 Other intervertebral disc displacement, lumbar region: Secondary | ICD-10-CM | POA: Diagnosis not present

## 2019-01-23 DIAGNOSIS — M4316 Spondylolisthesis, lumbar region: Secondary | ICD-10-CM | POA: Diagnosis not present

## 2019-01-23 DIAGNOSIS — M545 Low back pain: Secondary | ICD-10-CM | POA: Diagnosis not present

## 2019-02-02 ENCOUNTER — Encounter (HOSPITAL_COMMUNITY): Payer: Medicare HMO

## 2019-02-02 ENCOUNTER — Ambulatory Visit: Payer: Medicare HMO | Admitting: Family

## 2019-02-03 ENCOUNTER — Telehealth: Payer: Self-pay | Admitting: Cardiology

## 2019-02-03 NOTE — Telephone Encounter (Signed)
New message   Need to know if the patient is being treated as an outpatient. Please advise.

## 2019-02-03 NOTE — Telephone Encounter (Signed)
Attempted to call the 1800 number back from [facility] and was unable to talk to a representative at this time.

## 2019-02-07 NOTE — Telephone Encounter (Signed)
Notified pt is being seen on outpatient basis at this time ./cy

## 2019-03-17 ENCOUNTER — Other Ambulatory Visit: Payer: Self-pay | Admitting: Adult Health

## 2019-04-05 NOTE — Progress Notes (Signed)
HPI: FU syncope, ASDand atrial fibrillation. Patient with history of syncope felt secondary to autonomic dysfunction. Previously loop monitor considered; Dr Lovena Le felt it would be indicated if recurrent syncope. H/O pulmonary embolus; seen by pulmonary and chronic anticoagulation felt indicated. Peripheral vascular disease followed by Dr. Scot Dock. Echocardiogram November 2018 showed normal LV function, biatrial enlargement and moderate tricuspid regurgitation. Holter monitor November 2018 showed atrial fibrillation with PVCs or aberrantly conducted beats rate controlled.Patient previously placed on amiodarone and underwent successful cardioversion to sinus rhythm February 2019. Echocardiogram August 2020 showed normal LV function, severe RV dysfunction, severe right ventricular enlargement, moderate left atrial enlargement, severe right atrial enlargement, severe tricuspid regurgitation, mild aortic insufficiency.  Follow-up study September 2020 showed moderate to large secundum atrial septal defect with bidirectional shunting.  Since last seen,patient has some dyspnea on exertion improved compared to previous.  She denies orthopnea, PND, exertional chest pain or syncope.  She continues to have pedal edema but much improved.  Current Outpatient Medications  Medication Sig Dispense Refill  . acetaminophen (TYLENOL) 650 MG CR tablet Take 1,300 mg by mouth every 8 (eight) hours as needed for pain.    Marland Kitchen amiodarone (PACERONE) 200 MG tablet TAKE ONE TABLET TWICE DAILY FOR ONE WEEK THEN DECREASE TO 1 TABLET DAILY (Patient taking differently: Take 200 mg by mouth daily. ) 135 tablet 2  . amLODipine (NORVASC) 10 MG tablet Take 1 tablet (10 mg total) by mouth daily. 90 tablet 3  . apixaban (ELIQUIS) 2.5 MG TABS tablet Take 1 tablet (2.5 mg total) by mouth 2 (two) times daily. 60 tablet   . atorvastatin (LIPITOR) 20 MG tablet Take 1 tablet (20 mg total) by mouth daily at 6 PM. 90 tablet 3  . furosemide  (LASIX) 40 MG tablet Take 1 tablet (40 mg total) by mouth daily. 90 tablet 3  . HYDROcodone-acetaminophen (NORCO/VICODIN) 5-325 MG tablet Take 1-2 tablets by mouth every 4 (four) hours as needed for severe pain ((score 7 to 10)). 30 tablet 0  . levothyroxine (SYNTHROID) 25 MCG tablet Take 1 tablet (25 mcg total) by mouth daily. 90 tablet 3  . methocarbamol (ROBAXIN) 500 MG tablet Take 1 tablet (500 mg total) by mouth every 6 (six) hours as needed for muscle spasms. 60 tablet 1  . metoprolol succinate (TOPROL-XL) 25 MG 24 hr tablet TAKE 1 TABLET BY MOUTH EVERY DAY 90 tablet 3   No current facility-administered medications for this visit.     Past Medical History:  Diagnosis Date  . Arthritis   . BREAST BIOPSY, HX OF 06/10/2006  . Breast cyst   . Dysrhythmia   . HIP PAIN, LEFT 01/31/2007  . HYPERLIPIDEMIA 06/10/2006  . HYPERTENSION 06/10/2006  . Impaired glucose tolerance   . Lower extremity edema   . NEOPLASMS UNSPEC NATURE BONE SOFT TISSUE&SKIN 05/31/2008  . OSTEOPOROSIS 06/10/2006  . Pulmonary embolus [facility])     Past Surgical History:  Procedure Laterality Date  . ABDOMINAL HYSTERECTOMY    . APPENDECTOMY    . BREAST SURGERY     bx x2  . CARDIOVERSION N/A 03/11/2017   Procedure: CARDIOVERSION;  Surgeon: Sanda Klein, MD;  Location: [place] [facility];  Service: Cardiovascular;  Laterality: N/A;  . CHOLECYSTECTOMY  1980  . TRANSFORAMINAL LUMBAR INTERBODY FUSION (TLIF) WITH PEDICLE SCREW FIXATION 1 LEVEL Left 10/27/2018   Procedure: Left Lumbar five-sacral one Transforaminal lumbar interbody fusion;  Surgeon: Erline Levine, MD;  Location: [place];  Service: Neurosurgery;  Laterality: Left;  Social History   Socioeconomic History  . Marital status: Married    Spouse name: Not on file  . Number of children: 4  . Years of education: Not on file  . Highest education level: Not on file  Occupational History  . Not on file  Tobacco Use  . Smoking status: Never Smoker  . Smokeless  tobacco: Current User    Types: Snuff  Substance and Sexual Activity  . Alcohol use: No    Alcohol/week: 0.0 standard drinks  . Drug use: No  . Sexual activity: Not Currently  Other Topics Concern  . Not on file  Social History Narrative   She is retired    Worked in [employer] for 20 years    Married for 43 years    4 children - live locally.        Social Determinants of Health   Financial Resource Strain:   . Difficulty of Paying Living Expenses:   Food Insecurity:   . Worried About Charity fundraiser in the Last Year:   . Arboriculturist in the Last Year:   Transportation Needs:   . Film/video editor (Medical):   Marland Kitchen Lack of Transportation (Non-Medical):   Physical Activity:   . Days of Exercise per Week:   . Minutes of Exercise per Session:   Stress:   . Feeling of Stress :   Social Connections:   . Frequency of Communication with Friends and Family:   . Frequency of Social Gatherings with Friends and Family:   . Attends Religious Services:   . Active Member of Clubs or Organizations:   . Attends Archivist Meetings:   Marland Kitchen Marital Status:   Intimate Partner Violence:   . Fear of Current or Ex-Partner:   . Emotionally Abused:   Marland Kitchen Physically Abused:   . Sexually Abused:     Family History  Problem Relation Age of Onset  . Heart disease Mother        Pacemaker  . Heart disease Father   . Asthma Father   . Diabetes Father   . Cancer Maternal Aunt        breast  . Heart disease Sister   . Hyperlipidemia Sister   . Hypertension Sister   . Lung cancer Sister     ROS: no fevers or chills, productive cough, hemoptysis, dysphasia, odynophagia, melena, hematochezia, dysuria, hematuria, rash, seizure activity, orthopnea, PND, pedal edema, claudication. Remaining systems are negative.  Physical Exam: Well-developed well-nourished in no acute distress.  Skin is warm and dry.  HEENT is normal.  Neck is supple.  Chest is clear to auscultation with  normal expansion.  Cardiovascular exam is regular rate and rhythm.  Abdominal exam nontender or distended. No masses palpated. Extremities show trace to 1+ edema. neuro grossly intact  ECG- personally reviewed  A/P  1 paroxysmal atrial fibrillation-patient remains in sinus rhythm on exam.  Continue amiodarone and apixaban.  Check TSH, liver functions, chest x-ray, hemoglobin and renal function.  2 hypertension-blood pressure controlled.  Continue present medical regimen.  3 prior pulmonary embolus-long-term anticoagulation recommended.  Continue apixaban.  Check hemoglobin and renal function.  4 hyperlipidemia-continue statin.  5 prior syncope-no recurrences.  We will consider a loop monitor in the future if she has more events.  6 tricuspid regurgitation-conservative measures given patient's age.  7 peripheral vascular disease-continue statin.  8 atrial septal defect-as outlined in previous notes we will continue with diuretics at present dose.  Check potassium and renal function.  I previously discussed options for ASD.  Given that she is 86 years old with other comorbidities including severe back pain limiting mobility I do not think she would be a good candidate for surgery and she would not be agreeable.  She prefers only medical therapy which is appropriate.  9 chronic right-sided heart failure-continue present dose of diuretics.  10 chronic stage III kidney disease-check potassium and renal function.  Kirk Ruths, MD

## 2019-04-17 ENCOUNTER — Other Ambulatory Visit: Payer: Self-pay

## 2019-04-17 ENCOUNTER — Encounter: Payer: Self-pay | Admitting: Cardiology

## 2019-04-17 ENCOUNTER — Ambulatory Visit: Payer: Medicare HMO | Admitting: Cardiology

## 2019-04-17 VITALS — BP 148/54 | HR 52 | Ht 67.0 in | Wt 172.6 lb

## 2019-04-17 DIAGNOSIS — Q211 Atrial septal defect, unspecified: Secondary | ICD-10-CM

## 2019-04-17 DIAGNOSIS — I480 Paroxysmal atrial fibrillation: Secondary | ICD-10-CM

## 2019-04-17 DIAGNOSIS — R55 Syncope and collapse: Secondary | ICD-10-CM

## 2019-04-17 DIAGNOSIS — I079 Rheumatic tricuspid valve disease, unspecified: Secondary | ICD-10-CM | POA: Diagnosis not present

## 2019-04-17 NOTE — Patient Instructions (Signed)
Medication Instructions:  NO CHANGE *If you need a refill on your cardiac medications before your next appointment, please call your pharmacy*   Lab Work: Your physician recommends that you HAVE LAB WORK TODAY If you have labs (blood work) drawn today and your tests are completely normal, you will receive your results only by: . MyChart Message (if you have MyChart) OR . A paper copy in the mail If you have any lab test that is abnormal or we need to change your treatment, we will call you to review the results.   Testing/Procedures:  A chest x-ray takes a picture of the organs and structures inside the chest, including the heart, lungs, and blood vessels. This test can show several things, including, whether the heart is enlarges; whether fluid is building up in the lungs; and whether pacemaker / defibrillator leads are still in place. [address]=[facility]   Follow-Up: At [facility], you and your health needs are our priority.  As part of our continuing mission to provide you with exceptional heart care, we have created designated Provider Care Teams.  These Care Teams include your primary Cardiologist (physician) and Advanced Practice Providers (APPs -  Physician Assistants and Nurse Practitioners) who all work together to provide you with the care you need, when you need it.  We recommend signing up for the patient portal called "MyChart".  Sign up information is provided on this After Visit Summary.  MyChart is used to connect with patients for Virtual Visits (Telemedicine).  Patients are able to view lab/test results, encounter notes, upcoming appointments, etc.  Non-urgent messages can be sent to your provider as well.   To learn more about what you can do with MyChart, go to [email or web address]://[email or web address].com.    Your next appointment:   6 month(s)  The format for your next appointment:   Either In Person or Virtual  Provider:   You may see Brian Crenshaw, MD or one of  the following Advanced Practice Providers on your designated Care Team:    Luke Kilroy, PA-C  Callie Goodrich, PA-C  Jesse Cleaver, FNP     

## 2019-04-18 ENCOUNTER — Telehealth: Payer: Self-pay | Admitting: *Deleted

## 2019-04-18 DIAGNOSIS — N289 Disorder of kidney and ureter, unspecified: Secondary | ICD-10-CM

## 2019-04-18 LAB — COMPREHENSIVE METABOLIC PANEL
ALT: 34 IU/L — ABNORMAL HIGH (ref 0–32)
AST: 36 IU/L (ref 0–40)
Albumin/Globulin Ratio: 1.6 (ref 1.2–2.2)
Albumin: 4.5 g/dL (ref 3.6–4.6)
Alkaline Phosphatase: 112 IU/L (ref 39–117)
BUN/Creatinine Ratio: 27 (ref 12–28)
BUN: 52 mg/dL — ABNORMAL HIGH (ref 8–27)
Bilirubin Total: 0.8 mg/dL (ref 0.0–1.2)
CO2: 23 mmol/L (ref 20–29)
Calcium: 10.2 mg/dL (ref 8.7–10.3)
Chloride: 106 mmol/L (ref 96–106)
Creatinine, Ser: 1.9 mg/dL — ABNORMAL HIGH (ref 0.57–1.00)
GFR calc Af Amer: 27 mL/min/{1.73_m2} — ABNORMAL LOW (ref >59)
GFR calc non Af Amer: 24 mL/min/{1.73_m2} — ABNORMAL LOW (ref >59)
Globulin, Total: 2.8 g/dL (ref 1.5–4.5)
Glucose: 122 mg/dL — ABNORMAL HIGH (ref 65–99)
Potassium: 5.2 mmol/L (ref 3.5–5.2)
Sodium: 145 mmol/L — ABNORMAL HIGH (ref 134–144)
Total Protein: 7.3 g/dL (ref 6.0–8.5)

## 2019-04-18 LAB — TSH: TSH: 3.98 u[IU]/mL (ref 0.450–4.500)

## 2019-04-18 LAB — CBC
Hematocrit: 42.1 % (ref 34.0–46.6)
Hemoglobin: 14 g/dL (ref 11.1–15.9)
MCH: 31.8 pg (ref 26.6–33.0)
MCHC: 33.3 g/dL (ref 31.5–35.7)
MCV: 96 fL (ref 79–97)
Platelets: 306 10*3/uL (ref 150–450)
RBC: 4.4 x10E6/uL (ref 3.77–5.28)
RDW: 14.4 % (ref 11.7–15.4)
WBC: 7.5 10*3/uL (ref 3.4–10.8)

## 2019-04-18 MED ORDER — FUROSEMIDE 40 MG PO TABS: ORAL_TABLET | ORAL | 3 refills | Status: DC

## 2019-04-18 NOTE — Telephone Encounter (Addendum)
Spoke with pt, Aware of dr Jacalyn Lefevre recommendations. New script sent to the pharmacy and Lab orders mailed to the pt   ----- Message from Lelon Perla, MD sent at 04/18/2019  7:45 AM EDT ----- Change Lasix to 40 mg alternating with 20 mg daily.  Check bmet 2 weeks. Kirk Ruths

## 2019-04-19 ENCOUNTER — Other Ambulatory Visit: Payer: Self-pay

## 2019-04-19 ENCOUNTER — Ambulatory Visit
Admission: RE | Admit: 2019-04-19 | Discharge: 2019-04-19 | Disposition: A | Discharge status: Home / Self Care | Payer: Medicare HMO | Source: Ambulatory Visit | Attending: Cardiology | Admitting: Cardiology

## 2019-04-19 DIAGNOSIS — R0602 Shortness of breath: Secondary | ICD-10-CM | POA: Diagnosis not present

## 2019-04-19 DIAGNOSIS — I480 Paroxysmal atrial fibrillation: Secondary | ICD-10-CM

## 2019-04-20 ENCOUNTER — Telehealth: Payer: Self-pay | Admitting: *Deleted

## 2019-04-20 DIAGNOSIS — R9389 Abnormal findings on diagnostic imaging of other specified body structures: Secondary | ICD-10-CM

## 2019-04-20 NOTE — Telephone Encounter (Addendum)
pt aware of results order placed for follow up ct scan.   ----- Message from Lelon Perla, MD sent at 04/19/2019  4:34 PM EDT ----- Schedule noncontrast chest CT.Kirk Ruths

## 2019-04-24 DIAGNOSIS — M4316 Spondylolisthesis, lumbar region: Secondary | ICD-10-CM | POA: Diagnosis not present

## 2019-04-24 DIAGNOSIS — M5126 Other intervertebral disc displacement, lumbar region: Secondary | ICD-10-CM | POA: Diagnosis not present

## 2019-04-24 DIAGNOSIS — Z6828 Body mass index (BMI) 28.0-28.9, adult: Secondary | ICD-10-CM | POA: Diagnosis not present

## 2019-04-24 DIAGNOSIS — I10 Essential (primary) hypertension: Secondary | ICD-10-CM | POA: Diagnosis not present

## 2019-04-24 DIAGNOSIS — M545 Low back pain: Secondary | ICD-10-CM | POA: Diagnosis not present

## 2019-04-24 DIAGNOSIS — M5416 Radiculopathy, lumbar region: Secondary | ICD-10-CM | POA: Diagnosis not present

## 2019-05-01 ENCOUNTER — Other Ambulatory Visit: Payer: Self-pay | Admitting: Adult Health

## 2019-05-01 DIAGNOSIS — E785 Hyperlipidemia, unspecified: Secondary | ICD-10-CM

## 2019-05-02 NOTE — Telephone Encounter (Signed)
Ok for 90 days but needs CPE

## 2019-05-02 NOTE — Telephone Encounter (Signed)
Sent to the pharmacy by e-scribe for 90 days.  Pt scheduled for cpx.  Nothing further needed.

## 2019-05-03 DIAGNOSIS — N289 Disorder of kidney and ureter, unspecified: Secondary | ICD-10-CM | POA: Diagnosis not present

## 2019-05-03 LAB — BASIC METABOLIC PANEL
BUN/Creatinine Ratio: 21 (ref 12–28)
BUN: 35 mg/dL — ABNORMAL HIGH (ref 8–27)
CO2: 22 mmol/L (ref 20–29)
Calcium: 9.7 mg/dL (ref 8.7–10.3)
Chloride: 108 mmol/L — ABNORMAL HIGH (ref 96–106)
Creatinine, Ser: 1.7 mg/dL — ABNORMAL HIGH (ref 0.57–1.00)
GFR calc Af Amer: 31 mL/min/{1.73_m2} — ABNORMAL LOW (ref >59)
GFR calc non Af Amer: 27 mL/min/{1.73_m2} — ABNORMAL LOW (ref >59)
Glucose: 116 mg/dL — ABNORMAL HIGH (ref 65–99)
Potassium: 5.1 mmol/L (ref 3.5–5.2)
Sodium: 144 mmol/L (ref 134–144)

## 2019-05-05 ENCOUNTER — Other Ambulatory Visit: Payer: Self-pay | Admitting: Cardiology

## 2019-05-08 ENCOUNTER — Ambulatory Visit
Admission: RE | Admit: 2019-05-08 | Discharge: 2019-05-08 | Disposition: A | Discharge status: Home / Self Care | Payer: Medicare HMO | Source: Ambulatory Visit | Attending: Cardiology | Admitting: Cardiology

## 2019-05-08 ENCOUNTER — Other Ambulatory Visit: Payer: Self-pay

## 2019-05-08 DIAGNOSIS — R9389 Abnormal findings on diagnostic imaging of other specified body structures: Secondary | ICD-10-CM

## 2019-05-08 DIAGNOSIS — R918 Other nonspecific abnormal finding of lung field: Secondary | ICD-10-CM | POA: Diagnosis not present

## 2019-05-09 ENCOUNTER — Telehealth: Payer: Self-pay | Admitting: Adult Health

## 2019-05-09 ENCOUNTER — Other Ambulatory Visit: Payer: Self-pay | Admitting: Cardiology

## 2019-05-09 MED ORDER — AMLODIPINE BESYLATE 10 MG PO TABS: 10.0000 mg | ORAL_TABLET | Freq: Every day | ORAL | 0 refills | Status: DC

## 2019-05-09 NOTE — Telephone Encounter (Signed)
Medication Refill:  Metoprolol Furosemide Amlodipine  Pharmacy: [facility] [address] FAX: [phone]

## 2019-05-09 NOTE — Telephone Encounter (Signed)
Amlodipine sent to the pharmacy.  All others have been sent previously.

## 2019-05-09 NOTE — Telephone Encounter (Signed)
Medication was sent in to requested pharmacy on 05/05/19.

## 2019-05-09 NOTE — Telephone Encounter (Signed)
New Message     *STAT* If patient is at the pharmacy, call can be transferred to refill team.   1. Which medications need to be refilled? (please list name of each medication and dose if known) amiodarone (PACERONE) 200 MG tablet  2. Which pharmacy/location (including street and city if local pharmacy) is medication to be sent to?  [id number] [place] [place] [address]  3. Do they need a 30 day or 90 day supply? [place]

## 2019-05-13 ENCOUNTER — Other Ambulatory Visit: Payer: Self-pay | Admitting: Adult Health

## 2019-05-13 DIAGNOSIS — I272 Pulmonary hypertension, unspecified: Secondary | ICD-10-CM

## 2019-05-27 ENCOUNTER — Other Ambulatory Visit: Payer: Self-pay | Admitting: Adult Health

## 2019-05-30 NOTE — Telephone Encounter (Signed)
DENIED.  SENT IN FOR 90 DAYS ON 05/09/2019.

## 2019-06-01 ENCOUNTER — Other Ambulatory Visit: Payer: Self-pay | Admitting: *Deleted

## 2019-06-01 DIAGNOSIS — I739 Peripheral vascular disease, unspecified: Secondary | ICD-10-CM

## 2019-06-05 ENCOUNTER — Other Ambulatory Visit: Payer: Self-pay

## 2019-06-05 ENCOUNTER — Ambulatory Visit (INDEPENDENT_AMBULATORY_CARE_PROVIDER_SITE_OTHER): Payer: Medicare HMO | Admitting: Physician Assistant

## 2019-06-05 ENCOUNTER — Ambulatory Visit (HOSPITAL_COMMUNITY)
Admission: RE | Admit: 2019-06-05 | Discharge: 2019-06-05 | Disposition: A | Discharge status: Home / Self Care | Payer: Medicare HMO | Source: Ambulatory Visit | Attending: Surgery | Admitting: Surgery

## 2019-06-05 VITALS — BP 136/66 | HR 52 | Temp 97.2°F | Resp 16 | Ht 67.0 in | Wt 172.0 lb

## 2019-06-05 DIAGNOSIS — I779 Disorder of arteries and arterioles, unspecified: Secondary | ICD-10-CM | POA: Diagnosis not present

## 2019-06-05 DIAGNOSIS — I739 Peripheral vascular disease, unspecified: Secondary | ICD-10-CM | POA: Diagnosis not present

## 2019-06-05 NOTE — Progress Notes (Signed)
HISTORY AND PHYSICAL     CC:  follow up. Requesting Provider:  [name], NP  HPI: This is a 86 y.o. female who is here today for follow up.  She is a pt of Dr. Scot Dock.  She has a hx of left leg claudication and DVT in the LLE with PE and had been on Eliquis for this.  She was last seen in May 2020 and at that time, she did not have any non healing wounds.    The pt returns today for follow up and she is here with her husband.  She states that she has been doing well as far as her legs go.  Her husband states they are much better than they were a year ago as far as swelling.  She does not wear the compression b/c she states they are very hard to get on.  Her swelling is better in the mornings after she has been in bed all night.  She tells me that she could walk about a 1/2 a city block before having to stop.  She is mostly limited by her breathing than any claudication.  She does not have any non healing wounds.  She states that when she got a pedicure last, she made the right foot bleed.  She states she sees Dr. Stanford Breed and he has been adjusting her lasix and she takes 40mg  alternating with 20mg  every other day.  She also has an ASD, but is not a good surgical candidate.   She does have a hx of afib and says she was shocked for this and has been in normal rhythm since.    She and her husband have been married for 45 years.   The pt is on a statin for cholesterol management.    The pt is not on an aspirin.    Other AC:  Eliquis The pt is on BB, CCB for hypertension.  The pt does not have diabetes. Tobacco hx:  never   Past Medical History:  Diagnosis Date  . Arthritis   . BREAST BIOPSY, HX OF 06/10/2006  . Breast cyst   . Dysrhythmia   . HIP PAIN, LEFT 01/31/2007  . HYPERLIPIDEMIA 06/10/2006  . HYPERTENSION 06/10/2006  . Impaired glucose tolerance   . Lower extremity edema   . NEOPLASMS UNSPEC NATURE BONE SOFT TISSUE&SKIN 05/31/2008  . OSTEOPOROSIS 06/10/2006  . Pulmonary embolus  [facility])     Past Surgical History:  Procedure Laterality Date  . ABDOMINAL HYSTERECTOMY    . APPENDECTOMY    . BREAST SURGERY     bx x2  . CARDIOVERSION N/A 03/11/2017   Procedure: CARDIOVERSION;  Surgeon: Sanda Klein, MD;  Location: [place] [facility];  Service: Cardiovascular;  Laterality: N/A;  . CHOLECYSTECTOMY  1980  . TRANSFORAMINAL LUMBAR INTERBODY FUSION (TLIF) WITH PEDICLE SCREW FIXATION 1 LEVEL Left 10/27/2018   Procedure: Left Lumbar five-sacral one Transforaminal lumbar interbody fusion;  Surgeon: Erline Levine, MD;  Location: [place];  Service: Neurosurgery;  Laterality: Left;    Allergies  Allergen Reactions  . Tuberculin Tests Other (See Comments)    Tuberculosis shot, red spot on arm    Current Outpatient Medications  Medication Sig Dispense Refill  . acetaminophen (TYLENOL) 650 MG CR tablet Take 1,300 mg by mouth every 8 (eight) hours as needed for pain.    Marland Kitchen amiodarone (PACERONE) 200 MG tablet TAKE ONE TABLET TWICE DAILY FOR ONE WEEK THEN DECREASE TO 1 TABLET DAILY 135 tablet 3  . amLODipine (NORVASC)  10 MG tablet Take 1 tablet (10 mg total) by mouth daily. 90 tablet 0  . apixaban (ELIQUIS) 2.5 MG TABS tablet Take 1 tablet (2.5 mg total) by mouth 2 (two) times daily. 60 tablet   . atorvastatin (LIPITOR) 20 MG tablet TAKE 1 TABLET (20 MG TOTAL) BY MOUTH DAILY AT 6 PM. 90 tablet 0  . furosemide (LASIX) 40 MG tablet Take 1 tablet alternating with 1/2 tablet every other day 90 tablet 3  . HYDROcodone-acetaminophen (NORCO/VICODIN) 5-325 MG tablet Take 1-2 tablets by mouth every 4 (four) hours as needed for severe pain ((score 7 to 10)). 30 tablet 0  . levothyroxine (SYNTHROID) 25 MCG tablet Take 1 tablet (25 mcg total) by mouth daily. 90 tablet 3  . methocarbamol (ROBAXIN) 500 MG tablet Take 1 tablet (500 mg total) by mouth every 6 (six) hours as needed for muscle spasms. 60 tablet 1  . metoprolol succinate (TOPROL-XL) 25 MG 24 hr tablet TAKE 1 TABLET BY MOUTH EVERY DAY 90  tablet 3   No current facility-administered medications for this visit.    Family History  Problem Relation Age of Onset  . Heart disease Mother        Pacemaker  . Heart disease Father   . Asthma Father   . Diabetes Father   . Cancer Maternal Aunt        breast  . Heart disease Sister   . Hyperlipidemia Sister   . Hypertension Sister   . Lung cancer Sister     Social History   Socioeconomic History  . Marital status: Married    Spouse name: Not on file  . Number of children: 4  . Years of education: Not on file  . Highest education level: Not on file  Occupational History  . Not on file  Tobacco Use  . Smoking status: Never Smoker  . Smokeless tobacco: Current User    Types: Snuff  Substance and Sexual Activity  . Alcohol use: No    Alcohol/week: 0.0 standard drinks  . Drug use: No  . Sexual activity: Not Currently  Other Topics Concern  . Not on file  Social History Narrative   She is retired    Worked in [employer] for 20 years    Married for 43 years    4 children - live locally.        Social Determinants of Health   Financial Resource Strain:   . Difficulty of Paying Living Expenses:   Food Insecurity:   . Worried About Charity fundraiser in the Last Year:   . Arboriculturist in the Last Year:   Transportation Needs:   . Film/video editor (Medical):   Marland Kitchen Lack of Transportation (Non-Medical):   Physical Activity:   . Days of Exercise per Week:   . Minutes of Exercise per Session:   Stress:   . Feeling of Stress :   Social Connections:   . Frequency of Communication with Friends and Family:   . Frequency of Social Gatherings with Friends and Family:   . Attends Religious Services:   . Active Member of Clubs or Organizations:   . Attends Archivist Meetings:   Marland Kitchen Marital Status:   Intimate Partner Violence:   . Fear of Current or Ex-Partner:   . Emotionally Abused:   Marland Kitchen Physically Abused:   . Sexually Abused:      REVIEW  OF SYSTEMS:   [X]  denotes positive finding, [ ]   denotes negative finding Cardiac  Comments:  Chest pain or chest pressure:    Shortness of breath upon exertion: x   Short of breath when lying flat:    Irregular heart rhythm: x       Vascular    Pain in calf, thigh, or hip brought on by ambulation:    Pain in feet at night that wakes you up from your sleep:     Blood clot in your veins:    Leg swelling:  x       Pulmonary    Oxygen at home:    Productive cough:     Wheezing:         Neurologic    Sudden weakness in arms or legs:     Sudden numbness in arms or legs:     Sudden onset of difficulty speaking or slurred speech:    Temporary loss of vision in one eye:     Problems with dizziness:         Gastrointestinal    Blood in stool:     Vomited blood:         Genitourinary    Burning when urinating:     Blood in urine:        Psychiatric    Major depression:         Hematologic    Bleeding problems:    Problems with blood clotting too easily:        Skin    Rashes or ulcers:        Constitutional    Fever or chills:      PHYSICAL EXAMINATION:  Today's Vitals   06/05/19 1013  BP: 136/66  Pulse: (!) 52  Resp: 16  Temp: (!) 97.2 F (36.2 C)  TempSrc: Temporal  SpO2: 98%  Weight: 172 lb (78 kg)  Height: 5\' 7"  (1.702 m)   Body mass index is 26.94 kg/m.   General:  WDWN in NAD; vital signs documented above Gait: Not observed HENT: WNL, normocephalic Pulmonary: normal non-labored breathing , without Rales, rhonchi,  wheezing Cardiac: regular HR, without  Murmurs; without carotid bruits Abdomen: soft, NT, no masses Skin: without rashes Vascular Exam/Pulses:  Right Left  Radial 2+ (normal) 2+ (normal)  Ulnar Unable to palpate  Unable to palpate   Femoral 1+ (weak) 1+ (weak)  Popliteal Unable to palpate  Unable to palpate   DP 1+ (weak) Unable to palpate   PT Unable to palpate  Unable to palpate    Extremities: without ischemic changes,  without Gangrene , without cellulitis; without open wounds; BLE swelling with hemosiderin staining of both legs.  Musculoskeletal: no muscle wasting or atrophy  Neurologic: A&O X 3;  No focal weakness or paresthesias are detected Psychiatric:  The pt has Normal affect.   Non-Invasive Vascular Imaging:   ABI's/TBI's on 06/05/2019: Right:  1.07/0.79 - Great toe pressure: 119 (T) Left:  0.47/0.31 - Great toe pressure: 46 (M)  Previous ABI's/TBI's on 06/02/2018: Right:  1.21/0.73 - Great toe pressure: 123 (B) Left:  0.59/0.36 - Great toe pressure:  61 (B)   ASSESSMENT/PLAN:: 86 y.o. female here for follow up for PAD  -pt's ABI's are slightly decreased from her last visit.  She is not experiencing claudication as her walking is more limited by her shortness of breath with activity.  Discussed that it is very important for her to protect her feet.  She states that when she got her last pedicure, they made her right  foot bleed.  Explained that they need to take great care with her left foot and discussed if she develops a wound, she would be a risk for non healing given her decreased blood flow.   -she does have hx of renal insufficiency with last creatinine in early April 1.7.   -she will f/u in one year with ABI's.  She will follow up sooner should she have any issues.    Leontine Locket, [facility] [phone]  Clinic MD:   Trula Slade

## 2019-06-06 ENCOUNTER — Other Ambulatory Visit: Payer: Self-pay | Admitting: *Deleted

## 2019-06-06 DIAGNOSIS — I739 Peripheral vascular disease, unspecified: Secondary | ICD-10-CM

## 2019-06-28 ENCOUNTER — Other Ambulatory Visit: Payer: Self-pay

## 2019-06-29 ENCOUNTER — Other Ambulatory Visit: Payer: Self-pay | Admitting: Adult Health

## 2019-06-29 ENCOUNTER — Ambulatory Visit (INDEPENDENT_AMBULATORY_CARE_PROVIDER_SITE_OTHER): Payer: Medicare HMO | Admitting: Adult Health

## 2019-06-29 ENCOUNTER — Encounter: Payer: Self-pay | Admitting: Adult Health

## 2019-06-29 VITALS — BP 150/60 | Temp 97.7°F | Ht 66.0 in | Wt 175.0 lb

## 2019-06-29 DIAGNOSIS — I272 Pulmonary hypertension, unspecified: Secondary | ICD-10-CM

## 2019-06-29 DIAGNOSIS — I480 Paroxysmal atrial fibrillation: Secondary | ICD-10-CM

## 2019-06-29 DIAGNOSIS — I739 Peripheral vascular disease, unspecified: Secondary | ICD-10-CM

## 2019-06-29 DIAGNOSIS — Z0000 Encounter for general adult medical examination without abnormal findings: Secondary | ICD-10-CM | POA: Diagnosis not present

## 2019-06-29 DIAGNOSIS — E785 Hyperlipidemia, unspecified: Secondary | ICD-10-CM | POA: Diagnosis not present

## 2019-06-29 DIAGNOSIS — I10 Essential (primary) hypertension: Secondary | ICD-10-CM

## 2019-06-29 DIAGNOSIS — E039 Hypothyroidism, unspecified: Secondary | ICD-10-CM

## 2019-06-29 LAB — COMPREHENSIVE METABOLIC PANEL
ALT: 32 U/L (ref 0–35)
AST: 34 U/L (ref 0–37)
Albumin: 4.4 g/dL (ref 3.5–5.2)
Alkaline Phosphatase: 102 U/L (ref 39–117)
BUN: 50 mg/dL — ABNORMAL HIGH (ref 6–23)
CO2: 25 mEq/L (ref 19–32)
Calcium: 9.9 mg/dL (ref 8.4–10.5)
Chloride: 106 mEq/L (ref 96–112)
Creatinine, Ser: 1.86 mg/dL — ABNORMAL HIGH (ref 0.40–1.20)
GFR: 25.69 mL/min — ABNORMAL LOW (ref >60.00)
Glucose, Bld: 110 mg/dL — ABNORMAL HIGH (ref 70–99)
Potassium: 4.6 mEq/L (ref 3.5–5.1)
Sodium: 142 mEq/L (ref 135–145)
Total Bilirubin: 0.8 mg/dL (ref 0.2–1.2)
Total Protein: 7.4 g/dL (ref 6.0–8.3)

## 2019-06-29 LAB — LIPID PANEL
Cholesterol: 122 mg/dL (ref 0–200)
HDL: 64.4 mg/dL (ref >39.00)
LDL Cholesterol: 43 mg/dL (ref 0–99)
NonHDL: 57.35
Total CHOL/HDL Ratio: 2
Triglycerides: 71 mg/dL (ref 0.0–149.0)
VLDL: 14.2 mg/dL (ref 0.0–40.0)

## 2019-06-29 LAB — CBC WITH DIFFERENTIAL/PLATELET
Basophils Absolute: 0 10*3/uL (ref 0.0–0.1)
Basophils Relative: 0.5 % (ref 0.0–3.0)
Eosinophils Absolute: 0.1 10*3/uL (ref 0.0–0.7)
Eosinophils Relative: 1.8 % (ref 0.0–5.0)
HCT: 42.3 % (ref 36.0–46.0)
Hemoglobin: 14.1 g/dL (ref 12.0–15.0)
Lymphocytes Relative: 33.3 % (ref 12.0–46.0)
Lymphs Abs: 2.6 10*3/uL (ref 0.7–4.0)
MCHC: 33.3 g/dL (ref 30.0–36.0)
MCV: 95.9 fl (ref 78.0–100.0)
Monocytes Absolute: 0.7 10*3/uL (ref 0.1–1.0)
Monocytes Relative: 9.5 % (ref 3.0–12.0)
Neutro Abs: 4.3 10*3/uL (ref 1.4–7.7)
Neutrophils Relative %: 54.9 % (ref 43.0–77.0)
Platelets: 259 10*3/uL (ref 150.0–400.0)
RBC: 4.41 Mil/uL (ref 3.87–5.11)
RDW: 15.3 % (ref 11.5–15.5)
WBC: 7.8 10*3/uL (ref 4.0–10.5)

## 2019-06-29 LAB — IBC + FERRITIN
Ferritin: 89.7 ng/mL (ref 10.0–291.0)
Iron: 106 ug/dL (ref 42–145)
Saturation Ratios: 25.4 % (ref 20.0–50.0)
Transferrin: 298 mg/dL (ref 212.0–360.0)

## 2019-06-29 LAB — HEMOGLOBIN A1C: Hgb A1c MFr Bld: 6.5 % (ref 4.6–6.5)

## 2019-06-29 MED ORDER — LEVOTHYROXINE SODIUM 25 MCG PO TABS: 25.0000 ug | ORAL_TABLET | Freq: Every day | ORAL | 3 refills | Status: DC

## 2019-06-29 NOTE — Telephone Encounter (Signed)
SENT TO THE PHARMACY BY E-SCRIBE. 

## 2019-06-29 NOTE — Progress Notes (Signed)
Subjective:    Patient ID: Tammy [name], female    DOB: [birth year], 86 y.o.   MRN: [id number]  HPI Patient presents for yearly preventative medicine examination. He is a pleasant 87 year old female who  has a past medical history of Arthritis, BREAST BIOPSY, HX OF (06/10/2006), Breast cyst, Dysrhythmia, HIP PAIN, LEFT (01/31/2007), HYPERLIPIDEMIA (06/10/2006), HYPERTENSION (06/10/2006), Impaired glucose tolerance, Lower extremity edema, NEOPLASMS UNSPEC NATURE BONE SOFT TISSUE&SKIN (05/31/2008), OSTEOPOROSIS (06/10/2006), and Pulmonary embolus ([name]).  Hyperlipidemia -currently prescribed Lipitor 20 mg daily.  She denies myalgia or fatigue Lab Results  Component Value Date   CHOL 121 03/11/2018   HDL 61.80 03/11/2018   LDLCALC 41 03/11/2018   TRIG 91.0 03/11/2018   CHOLHDL 2 03/11/2018   Hypothyroidism -takes Synthroid 25 mcg every morning.  She feels well controlled on this dose Lab Results  Component Value Date   TSH 3.980 04/17/2019   Hypertension -controlled with Norvasc 10 mg, Toprol 25 mg, and Lasix.  She denies dizziness, lightheadedness, or chest pain.  BP Readings from Last 3 Encounters:  06/29/19 (!) 150/60  06/05/19 136/66  04/17/19 (!) 148/54   PAF-followed by cardiology.  She is currently prescribed amiodarone and Eliquis.  She underwent a successful cardioversion to sinus rhythm in February 2019.  PVD-followed by vascular surgery.  On statin.  She is happy to report that she has been doing better as far as her lower extremity edema and leg pain.  She does not report any nonhealing wounds.  She was last seen by vascular surgery on 06/05/2019 at which time her ABIs were slightly decreased from her previous visit..  H/o PE-only on Eliquis.  She denies chest pain  CKD III -continued on Lasix alternating  between 20 and 40 mg  Chronic Back Pain -she had successful lumbar sacral interbody fusion in October 2020.  She reports that the next day she was completely out of pain  All  immunizations and health maintenance protocols were reviewed with the patient and needed orders were placed. She is up to date on vaccinations   Appropriate screening laboratory values were ordered for the patient including screening of hyperlipidemia, renal function and hepatic function.   Medication reconciliation,  past medical history, social history, problem list and allergies were reviewed in detail with the patient  Goals were established with regard to weight loss, exercise, and  diet in compliance with medications Wt Readings from Last 3 Encounters:  06/29/19 175 lb (79.4 kg)  06/05/19 172 lb (78 kg)  04/17/19 172 lb 9.6 oz (78.3 kg)   End of life planning was discussed. Has an advanced directive and living will.   She has no acute issues she would like to discuss today   Review of Systems  Constitutional: Negative.   HENT: Negative.   Eyes: Negative.   Respiratory: Positive for shortness of breath.   Cardiovascular: Positive for leg swelling.  Gastrointestinal: Negative.   Endocrine: Negative.   Genitourinary: Negative.   Skin: Negative.   Allergic/Immunologic: Negative.   Hematological: Negative.   Psychiatric/Behavioral: Negative.    Past Medical History:  Diagnosis Date   Arthritis    BREAST BIOPSY, HX OF 06/10/2006   Breast cyst    Dysrhythmia    HIP PAIN, LEFT 01/31/2007   HYPERLIPIDEMIA 06/10/2006   HYPERTENSION 06/10/2006   Impaired glucose tolerance    Lower extremity edema    NEOPLASMS UNSPEC NATURE BONE SOFT TISSUE&SKIN 05/31/2008   OSTEOPOROSIS 06/10/2006   Pulmonary embolus ([place])  Social History   Socioeconomic History   Marital status: Married    Spouse name: Not on file   Number of children: 4   Years of education: Not on file   Highest education level: Not on file  Occupational History   Not on file  Tobacco Use   Smoking status: Never Smoker   Smokeless tobacco: Current User    Types: Snuff  Substance and Sexual  Activity   Alcohol use: No    Alcohol/week: 0.0 standard drinks   Drug use: No   Sexual activity: Not Currently  Other Topics Concern   Not on file  Social History Narrative   She is retired    Worked in [employer] for 20 years    Married for 43 years    4 children - live locally.        Social Determinants of Health   Financial Resource Strain:    Difficulty of Paying Living Expenses:   Food Insecurity:    Worried About Charity fundraiser in the Last Year:    Arboriculturist in the Last Year:   Transportation Needs:    Film/video editor (Medical):    Lack of Transportation (Non-Medical):   Physical Activity:    Days of Exercise per Week:    Minutes of Exercise per Session:   Stress:    Feeling of Stress :   Social Connections:    Frequency of Communication with Friends and Family:    Frequency of Social Gatherings with Friends and Family:    Attends Religious Services:    Active Member of Clubs or Organizations:    Attends Music therapist:    Marital Status:   Intimate Partner Violence:    Fear of Current or Ex-Partner:    Emotionally Abused:    Physically Abused:    Sexually Abused:     Past Surgical History:  Procedure Laterality Date   ABDOMINAL HYSTERECTOMY     APPENDECTOMY     BREAST SURGERY     bx x2   CARDIOVERSION N/A 03/11/2017   Procedure: CARDIOVERSION;  Surgeon: Sanda Klein, MD;  Location: [facility];  Service: Cardiovascular;  Laterality: N/A;   CHOLECYSTECTOMY  1980   TRANSFORAMINAL LUMBAR INTERBODY FUSION (TLIF) WITH PEDICLE SCREW FIXATION 1 LEVEL Left 10/27/2018   Procedure: Left Lumbar five-sacral one Transforaminal lumbar interbody fusion;  Surgeon: Erline Levine, MD;  Location: [facility];  Service: Neurosurgery;  Laterality: Left;    Family History  Problem Relation [name] of Onset   Heart disease Mother        Pacemaker   Heart disease Father    Asthma Father    Diabetes Father     Cancer Maternal Aunt        breast   Heart disease Sister    Hyperlipidemia Sister    Hypertension Sister    Lung cancer Sister     Allergies  Allergen Reactions   Tuberculin Tests Other (See Comments)    Tuberculosis shot, red spot on arm    Current Outpatient Medications on File Prior to Visit  Medication Sig Dispense Refill   acetaminophen (TYLENOL) 650 MG CR tablet Take 1,300 mg by mouth every 8 (eight) hours as needed for pain.     amiodarone (PACERONE) 200 MG tablet TAKE ONE TABLET TWICE DAILY FOR ONE WEEK THEN DECREASE TO 1 TABLET DAILY 135 tablet 3   amLODipine (NORVASC) 10 MG tablet Take 1 tablet (10 mg total) by  mouth daily. 90 tablet 0   apixaban (ELIQUIS) 2.5 MG TABS tablet Take 1 tablet (2.5 mg total) by mouth 2 (two) times daily. 60 tablet    atorvastatin (LIPITOR) 20 MG tablet TAKE 1 TABLET (20 MG TOTAL) BY MOUTH DAILY AT 6 PM. 90 tablet 0   furosemide (LASIX) 40 MG tablet Take 1 tablet alternating with 1/2 tablet every other day 90 tablet 3   methocarbamol (ROBAXIN) 500 MG tablet Take 1 tablet (500 mg total) by mouth every 6 (six) hours as needed for muscle spasms. 60 tablet 1   metoprolol succinate (TOPROL-XL) 25 MG 24 hr tablet TAKE 1 TABLET BY MOUTH EVERY DAY 90 tablet 3   No current facility-administered medications on file prior to visit.    BP (!) 150/60    Temp 97.7 F (36.5 C)    Ht 5\' 6"  (1.676 m)    Wt 175 lb (79.4 kg)    LMP  (LMP Unknown)    BMI 28.25 kg/m       Objective:   Physical Exam Vitals and nursing note reviewed.  Constitutional:      General: She is not in acute distress.    Appearance: Normal appearance. She is well-developed. She is not ill-appearing.  HENT:     Head: Normocephalic and atraumatic.     Right Ear: Tympanic membrane, ear canal and external ear normal. There is no impacted cerumen.     Left Ear: Tympanic membrane, ear canal and external ear normal. There is no impacted cerumen.     Nose: Nose normal. No  congestion or rhinorrhea.     Mouth/Throat:     Mouth: Mucous membranes are moist.     Pharynx: Oropharynx is clear. No oropharyngeal exudate or posterior oropharyngeal erythema.  Eyes:     General:        Right eye: No discharge.        Left eye: No discharge.     Extraocular Movements: Extraocular movements intact.     Conjunctiva/sclera: Conjunctivae normal.     Pupils: Pupils are equal, round, and reactive to light.  Neck:     Thyroid: No thyromegaly.     Vascular: No carotid bruit.     Trachea: No tracheal deviation.  Cardiovascular:     Rate and Rhythm: Normal rate and regular rhythm.     Pulses: Normal pulses.     Heart sounds: Normal heart sounds. No murmur. No friction rub. No gallop.   Pulmonary:     Effort: Pulmonary effort is normal. No respiratory distress.     Breath sounds: Normal breath sounds. No stridor. No wheezing, rhonchi or rales.  Chest:     Chest wall: No tenderness.  Abdominal:     General: Abdomen is flat. Bowel sounds are normal. There is no distension.     Palpations: Abdomen is soft. There is no mass.     Tenderness: There is no abdominal tenderness. There is no right CVA tenderness, left CVA tenderness, guarding or rebound.     Hernia: No hernia is present.  Musculoskeletal:        General: No swelling, tenderness, deformity or signs of injury. Normal range of motion.     Cervical back: Normal range of motion and neck supple.     Right lower leg: No edema.     Left lower leg: No edema.     Comments: Bilateral pedal edema but improved    Lymphadenopathy:     Cervical: No cervical adenopathy.  Skin:    General: Skin is warm and dry.     Coloration: Skin is not jaundiced or pale.     Findings: No bruising, erythema, lesion or rash.  Neurological:     General: No focal deficit present.     Mental Status: She is alert and oriented to person, place, and time.     Cranial Nerves: No cranial nerve deficit.     Sensory: No sensory deficit.      Motor: No weakness.     Coordination: Coordination normal.     Gait: Gait normal.     Deep Tendon Reflexes: Reflexes normal.  Psychiatric:        Mood and Affect: Mood normal.        Behavior: Behavior normal.        Thought Content: Thought content normal.        Judgment: Judgment normal.       Assessment & Plan:  1. Routine general medical examination at a health care facility -  - Lipid panel - Hemoglobin A1c - CBC with Differential/Platelet - Comprehensive metabolic panel - IBC + Ferritin  2. Hypothyroidism, unspecified type TSH was done in March 2021 by Dr. Stanford Breed. This was WNL - Lipid panel - Hemoglobin A1c - CBC with Differential/Platelet - Comprehensive metabolic panel - IBC + Ferritin - levothyroxine (SYNTHROID) 25 MCG tablet; Take 1 tablet (25 mcg total) by mouth daily.  Dispense: 90 tablet; Refill: 3  3. Essential hypertension - Not at goal today but she did not take all of her medications yet this morning.  - No change in therapy  - Lipid panel - Hemoglobin A1c - CBC with Differential/Platelet - Comprehensive metabolic panel - IBC + Ferritin  4. Dyslipidemia - Consider change in statin  - Lipid panel - Hemoglobin A1c - CBC with Differential/Platelet - Comprehensive metabolic panel - IBC + Ferritin  5. PAD (peripheral artery disease) ([place]) - Follow up with vascular surgery as directed - Lipid panel - Hemoglobin A1c - CBC with Differential/Platelet - Comprehensive metabolic panel - IBC + Ferritin  6. PAF (paroxysmal atrial fibrillation) ([place]) - Follow up with Cardiology as directed - Lipid panel - Hemoglobin A1c - CBC with Differential/Platelet - Comprehensive metabolic panel - IBC + Ferritin   [name], NP

## 2019-06-29 NOTE — Patient Instructions (Signed)
It was great seeing you today   I am glad you are feeling better.   I have sent in your synthroid for a year.   You have refills of Lasix and Metoprolol at the pharmacy   We will follow up with you regarding your blood work   I will see you back in one year or sooner if needed

## 2019-07-29 ENCOUNTER — Other Ambulatory Visit: Payer: Self-pay | Admitting: Adult Health

## 2019-07-29 DIAGNOSIS — E785 Hyperlipidemia, unspecified: Secondary | ICD-10-CM

## 2019-08-01 NOTE — Telephone Encounter (Signed)
Sent to the pharmacy by e-scribe. 

## 2019-08-06 ENCOUNTER — Other Ambulatory Visit: Payer: Self-pay | Admitting: Adult Health

## 2019-08-08 NOTE — Telephone Encounter (Signed)
SENT TO THE PHARMACY BY E-SCRIBE. 

## 2019-08-17 DIAGNOSIS — Z0100 Encounter for examination of eyes and vision without abnormal findings: Secondary | ICD-10-CM | POA: Diagnosis not present

## 2019-08-17 DIAGNOSIS — H5203 Hypermetropia, bilateral: Secondary | ICD-10-CM | POA: Diagnosis not present

## 2019-09-22 ENCOUNTER — Telehealth: Payer: Self-pay | Admitting: Adult Health

## 2019-09-22 NOTE — Progress Notes (Signed)
  Chronic Care Management   Note  09/22/2019 Name: Tammy [name] MRN: [id number] DOB: 22-Sep-1931  Tammy [name] is a 86 y.o. year old female who is a primary care patient of [name], NP. I reached out to [facility] by phone today in response to a referral sent by [name] PCP, [name], NP.   [name] was given information about Chronic Care Management services today including:  1. CCM service includes personalized support from designated clinical staff supervised by her physician, including individualized plan of care and coordination with other care providers 2. 24/7 contact phone numbers for assistance for urgent and routine care needs. 3. Service will only be billed when office clinical staff spend 20 minutes or more in a month to coordinate care. 4. Only one practitioner may furnish and bill the service in a calendar month. 5. The patient may stop CCM services at any time (effective at the end of the month) by phone call to the office staff.   Patient agreed to services and verbal consent obtained.   Follow up plan:   Carley Perdue [employer] Scheduler

## 2019-10-04 ENCOUNTER — Other Ambulatory Visit: Payer: Self-pay | Admitting: Adult Health

## 2019-10-04 DIAGNOSIS — E039 Hypothyroidism, unspecified: Secondary | ICD-10-CM

## 2019-10-04 DIAGNOSIS — I10 Essential (primary) hypertension: Secondary | ICD-10-CM

## 2019-10-04 DIAGNOSIS — I480 Paroxysmal atrial fibrillation: Secondary | ICD-10-CM

## 2019-10-11 ENCOUNTER — Telehealth: Payer: Self-pay | Admitting: Pharmacist

## 2019-10-11 NOTE — Chronic Care Management (AMB) (Signed)
Chronic Care Management Pharmacy  Name: Tammy [name]  MRN: [id number] DOB: [birth year]  Initial Questions: 1. Have you seen any other providers since your last visit? n/a 2. Any changes in your medicines or health? No   Chief Complaint/ HPI  Tammy [name],  86 y.o. , female presents for their Initial CCM visit with the clinical pharmacist via telephone.  PCP : [name], NP  Their chronic conditions include: HTN, HLD, preDM, osteoporosis, AFib, pulmonary hypertension, hypothyroidism  Office Visits: -06/29/19 [name], NP - Patient presented for yearly preventative examination. Patient has no acute issues. A1c was 6.5% which increased from 6.3%. BUN/Scr elevated. All other labs WNL. Follow up in 6 months.  Consult Visit: -06/05/19 Leontine Locket, PA-C (vascular surgery): Patient presented for follow up for left leg claudication and DVT in LLE with PE. Patient's leg swelling is much better than a year ago. ABIs have slightly decreased from last visit. Follow up in 1 year.  -04/24/19 Erline Levine (neurosurgery): Patient presented with low back pain. Unable to review notes.  -04/17/19 Kirk Ruths , MD (cardiology): Patient presented for Afib follow up. CMP, CBC, and TSH completed. Renal function had declined and sodium and glucose were elevated. Follow up in 6 months.  Medications: Outpatient Encounter Medications as of 10/12/2019  Medication Sig  . acetaminophen (TYLENOL) 650 MG CR tablet Take 1,300 mg by mouth every 8 (eight) hours as needed for pain.  Marland Kitchen amiodarone (PACERONE) 200 MG tablet TAKE ONE TABLET TWICE DAILY FOR ONE WEEK THEN DECREASE TO 1 TABLET DAILY  . amLODipine (NORVASC) 10 MG tablet TAKE 1 TABLET BY MOUTH EVERY DAY  . apixaban (ELIQUIS) 2.5 MG TABS tablet Take 1 tablet (2.5 mg total) by mouth 2 (two) times daily.  Marland Kitchen atorvastatin (LIPITOR) 20 MG tablet TAKE 1 TABLET (20 MG TOTAL) BY MOUTH DAILY AT 6 PM.  . furosemide (LASIX) 20 MG tablet TAKE 1 TABLET BY MOUTH EVERY  DAY  . furosemide (LASIX) 40 MG tablet Take 1 tablet alternating with 1/2 tablet every other day  . levothyroxine (SYNTHROID) 25 MCG tablet Take 1 tablet (25 mcg total) by mouth daily.  . metoprolol succinate (TOPROL-XL) 25 MG 24 hr tablet TAKE 1 TABLET BY MOUTH EVERY DAY  . methocarbamol (ROBAXIN) 500 MG tablet Take 1 tablet (500 mg total) by mouth every 6 (six) hours as needed for muscle spasms.   No facility-administered encounter medications on file as of 10/12/2019.   Patient reports living with her husband. She also has a big family with 3 grandchildren and some great and great-great grandchildren.  Patient reports enjoying reading books at home and watching a little bit of TV. She does the cooking and her husband does the cleaning. She reports eating more bread, pasta, beans and does not eat many vegetables. She does the laundry and patient does not drive as her husband does the driving.  Patient reports sleeping about 7 hours per night and usually has to wake up about twice during the night to go to the bathroom. She does admit to taking some naps during the daytime as well.  Pt and her husband's car would not start today and opted for a telephone visit instead of in office.  Current Diagnosis/Assessment:  Goals Addressed            This Visit's Progress   . Pharmacy care plan       CARE PLAN ENTRY (see longitudinal plan of care for additional care plan information)  Current Barriers:  . Chronic Disease Management support, education, and care coordination needs related to Hypertension, Hyperlipidemia, Atrial Fibrillation, Hypothyroidism, Osteoporosis, and prediabetes   Hypertension BP Readings from Last 3 Encounters:  06/29/19 (!) 150/60  06/05/19 136/66  04/17/19 (!) 148/54   . Pharmacist Clinical Goal(s): o Over the next 60 days, patient will work with PharmD and providers to maintain BP goal <140/90 . Current regimen:  . Metoprolol succinate 25 mg 1 tablet  daily . Amlodipine 10 mg 1 tablet daily . Interventions: o We discussed the importance of checking your blood pressure at home in order to see how well your medicines are working . Patient self care activities - Over the next 60 days, patient will: o Check blood pressure at least weekly, document, and provide at future appointments o Ensure daily salt intake < 2300 mg/day  Hyperlipidemia Lab Results  Component Value Date/Time   LDLCALC 43 06/29/2019 08:21 AM   LDLCALC 48 11/25/2016 10:04 AM   . Pharmacist Clinical Goal(s): o Over the next 120 days, patient will work with PharmD and providers to maintain LDL goal < 70 . Current regimen:  o Atorvastatin 20 mg 1 tablet daily . Patient self care activities - Over the next 120 days, patient will: o Continue taking your medicines as prescribed  Pre-Diabetes Lab Results  Component Value Date/Time   HGBA1C 6.5 06/29/2019 08:21 AM   HGBA1C 6.3 (H) 04/02/2012 04:20 PM   . Pharmacist Clinical Goal(s): o Over the next 120 days, patient will work with PharmD and providers to maintain A1c goal <6.5% . Current regimen:  o No medications . Interventions: o We discussed following the healthy plate guidelines of filling half the plate with non-starchy vegetables o Will provide healthy plate handout in mail . Patient self care activities - Over the next 120 days, patient will: o Limiting sweets and making sure to eat more vegetables to lower blood sugars  Osteoporosis . Pharmacist Clinical Goal(s) o Over the next 120 days, patient will work with PharmD and providers to maintain bone health . Current regimen:  o No medications . Interventions: o We discussed getting the recommended [phone] units of vitamin D daily and 1200 mg of calcium daily from dietary and supplemental sources. . Patient self care activities - Over the next 120 days, patient will: o Incorporate more foods with calcium and vitamin D and supplement as needed to get the  recommended amount  Pulmonary hypertension . Pharmacist Clinical Goal(s) o Over the next 120 days, patient will work with PharmD and providers to maintain bone health . Current regimen:  . Furosemide 20 mg 1 tablet daily . Furosemide 40 mg 1 tablet alternating with 1/2 tablet every other day . Interventions: o We discussed checking package labels for sodium and trying to limit sodium intake to < 1500 mg/day o We discussed elevating your feet if you notice swelling . Patient self care activities - Over the next 120 days, patient will: o Continue to monitor sodium intake and to aim for < 1500 mg/day o Reach out to cardiologist about the need to weigh herself daily  Medication management . Pharmacist Clinical Goal(s): o Over the next 120 days, patient will work with PharmD and providers to maintain optimal medication adherence . Current [facility] . Interventions o Comprehensive medication review performed. o Continue current medication management strategy . Patient self care activities - Over the next 120 days, patient will: o Take medications as prescribed o Report any questions or concerns to  PharmD and/or provider(s)  Initial goal documentation        Hypertension   BP goal is:  <140/90 (due to previous history with dizziness/syncope)  Office blood pressures are  BP Readings from Last 3 Encounters:  06/29/19 (!) 150/60  06/05/19 136/66  04/17/19 (!) 148/54   Patient checks BP at home infrequently  Patient has failed these meds in the past: diltiazem (unknown), HCTZ Patient is currently uncontrolled on the following medications:  Marland Kitchen Metoprolol succinate 25 mg 1 tablet daily . Amlodipine 10 mg 1 tablet daily  We discussed diet and exercise extensively and the importance of monitoring blood pressure at home   Plan  Continue current medications. Plan to monitoring blood pressure at home at least weekly.  Will follow up with CPA in 2 months for BP assessment.    Hyperlipidemia   LDL goal < 70  Lipid Panel     Component Value Date/Time   CHOL 122 06/29/2019 0821   CHOL 135 11/25/2016 1004   TRIG 71.0 06/29/2019 0821   HDL 64.40 06/29/2019 0821   HDL 65 11/25/2016 1004   LDLCALC 43 06/29/2019 0821   LDLCALC 48 11/25/2016 1004    Hepatic Function Latest Ref Rng & Units 06/29/2019 04/17/2019 09/26/2018  Total Protein 6.0 - 8.3 g/dL 7.4 7.3 7.2  Albumin 3.5 - 5.2 g/dL 4.4 4.5 4.7(H)  AST 0 - 37 U/L 34 36 36  ALT 0 - 35 U/L 32 34(H) 38(H)  Alk Phosphatase 39 - 117 U/L 102 112 82  Total Bilirubin 0.2 - 1.2 mg/dL 0.8 0.8 0.7  Bilirubin, Direct 0.00 - 0.40 mg/dL - - 0.22     The ASCVD Risk score ([place]., et al., 2013) failed to calculate for the following reasons:   The 2013 ASCVD risk score is only valid for ages 59 to 31   Patient has failed these meds in past: simvastatin (unknown) Patient is currently controlled on the following medications:  . Atorvastatin 20 mg 1 tablet daily  We discussed:  diet and exercise extensively  Plan  Continue current medications and control with diet and exercise  AFIB   Patient is currently rate controlled. Office heart rates are  Pulse Readings from Last 3 Encounters:  06/05/19 (!) 52  04/17/19 (!) 52  01/12/19 (!) 58    CHA2DS2-VASc Score =    7 The patient's score is based upon: [name], sex, HTN, PE    Patient has failed these meds in past: none Patient is currently controlled on the following medications:  . Eliquis 2.5 mg 1 tablet twice daily . Metoprolol succinate 25 mg 1 tablet daily . Amiodarone 200 mg 1 tablet daily  We discussed:  monitoring pulse along with blood pressure  Plan  Continue current medications    Diabetes/PreDM   Recent Relevant Labs: Lab Results  Component Value Date/Time   HGBA1C 6.5 06/29/2019 08:21 AM   HGBA1C 6.3 (H) 04/02/2012 04:20 PM   GFR 25.69 (L) 06/29/2019 08:21 AM   GFR 38.62 (L) 03/11/2018 11:08 AM    Last diabetic eye exam: No results  found for: HMDIABEYEEXA  Last diabetic foot exam: No results found for: HMDIABFOOTEX   Patient has failed these meds in past: none Patient is currently controlled on the following medications: . No medications  We discussed: diet and exercise extensively and A1c and how this measures blood sugars  -Patient was unaware of what her A1c was and being borderline for diabetes -Discussed healthy plate diet and  limiting sweets/carbs -Patient reports not eating many vegetables and recommended for her to incorporate more  Plan Continue control with diet and exercise  Will send healthy plate information.  Hypothyroidism   Lab Results  Component Value Date/Time   TSH 3.980 04/17/2019 03:29 PM   TSH 4.220 09/26/2018 11:54 AM    Patient has failed these meds in past: none Patient is currently controlled on the following medications:  . Levothyroxine 25 mcg 1 tablet daily  We discussed:  making sure to take the medicine on an empty stomach  Plan  Continue current medications  Pulmonary hypertension   Patient has failed these meds in past: none Patient is currently uncontrolled on the following medications:  . Furosemide 20 mg 1 tablet daily . Furosemide 40 mg 1 tablet alternating with 1/2 tablet every other day  We discussed:  -Checking package labels and goal of sodium intake of <1500 mg/day -Checking for swelling around feet and abdomen every day -Reaching out to cardiologist with signs of excess swelling -Elevating her feet  Plan Discuss with cardiologist if patient needs to weigh herself daily Continue current medications   Osteoporosis    Patient reports she has had a DEXA scan in the past but was unable to find record. Patient reports a fracture history.  No results found for: VD25OH   Patient has failed these meds in past: Fosamax (completed 5 years of therapy) Patient is currently uncontrolled on the following medications:   No medications  We discussed:   Recommend [phone] units of vitamin D daily. Recommend 1200 mg of calcium daily from dietary and supplemental sources.  Plan Start supplementation with calcium 1200 mg/day and vitamin D [phone] units daily. Will consult PCP for repeat DEXA plans.  Chronic back pain   Patient is currently controlled on the following medications:  . Tylenol 650 mg used as needed  We discussed:  The maximum recommended dose of Tylenol for day of 3,000 mg/day  Plan  Continue current medications   Vaccines   Reviewed and discussed patient's vaccination history.    Immunization History  Administered Date(s) Administered  . Fluad Quad(high Dose 65+) 12/06/2018  . Influenza Split 12/15/2010, 12/14/2011, 10/27/2014  . Influenza Whole 10/09/2009  . Influenza, High Dose Seasonal PF 12/13/2012, 11/04/2014, 10/28/2017  . Influenza,inj,Quad PF,6+ Mos 12/18/2013  . Influenza-Unspecified 11/26/2015, 10/30/2016  . PFIZER SARS-COV-2 Vaccination 04/22/2019, 05/13/2019  . Pneumococcal Conjugate-13 05/31/2013  . Pneumococcal Polysaccharide-23 06/13/2010  . Tdap 06/13/2010   Plan   Recommended patient receive Shingrix and influenza vaccine at pharmacy/in office.   Medication Management   [facility] Uses pill box? No - husband sets out medicines for her and keeps track Pt endorses 100% compliance  Plan  Continue current medication management strategy   Follow up: 4 month phone visit Follow up in 2 months for BP assessment with CPA.  Jeni Salles, PharmD Clinical Pharmacist [facility] [place]

## 2019-10-11 NOTE — Progress Notes (Signed)
    Chronic Care Management Pharmacy Assistant   Name: Tammy [name]  MRN: [id number] DOB: 08-13-1931  Reason for Encounter:  Medication Review/Initial Questions for Pharmacist visit on 10/12/2019  Patient Questions:  1. Have you seen any other providers since your last visit? No 2. Any changes in your medications or health? No 3. Any side effects from any medications? No 4. Do you have an symptoms or problems not managed by your medications? No 5. Any concerns about your health right now? No 6. Has your provider asked that you check blood pressure, blood sugar, or follow special diet at home? No 7. Do you get any type of exercise on a regular basis? No 8. Can you think of a goal you would like to reach for your health? No 9. Do you have any problems getting your medications? No 10. Is there anything that you would like to discuss during the appointment? No    PCP : [name], NP  Allergies:   Allergies  Allergen Reactions  . Tuberculin Tests Other (See Comments)    Tuberculosis shot, red spot on arm    Medications: Outpatient Encounter Medications as of 10/11/2019  Medication Sig  . acetaminophen (TYLENOL) 650 MG CR tablet Take 1,300 mg by mouth every 8 (eight) hours as needed for pain.  Marland Kitchen amiodarone (PACERONE) 200 MG tablet TAKE ONE TABLET TWICE DAILY FOR ONE WEEK THEN DECREASE TO 1 TABLET DAILY  . amLODipine (NORVASC) 10 MG tablet TAKE 1 TABLET BY MOUTH EVERY DAY  . apixaban (ELIQUIS) 2.5 MG TABS tablet Take 1 tablet (2.5 mg total) by mouth 2 (two) times daily.  Marland Kitchen atorvastatin (LIPITOR) 20 MG tablet TAKE 1 TABLET (20 MG TOTAL) BY MOUTH DAILY AT 6 PM.  . furosemide (LASIX) 20 MG tablet TAKE 1 TABLET BY MOUTH EVERY DAY  . furosemide (LASIX) 40 MG tablet Take 1 tablet alternating with 1/2 tablet every other day  . levothyroxine (SYNTHROID) 25 MCG tablet Take 1 tablet (25 mcg total) by mouth daily.  . methocarbamol (ROBAXIN) 500 MG tablet Take 1 tablet (500 mg total) by mouth  every 6 (six) hours as needed for muscle spasms.  . metoprolol succinate (TOPROL-XL) 25 MG 24 hr tablet TAKE 1 TABLET BY MOUTH EVERY DAY   No facility-administered encounter medications on file as of 10/11/2019.    Current Diagnosis: Patient Active Problem List   Diagnosis Date Noted  . Spondylolisthesis of lumbar region 10/27/2018  . Atrial fibrillation ([place]) 03/21/2014  . Embolism, pulmonary with infarction ([place]) 03/08/2014  . Severe tricuspid regurgitation by prior echocardiogram 02/23/2014  . Pulmonary hypertension ([place]) 02/23/2014  . Syncope and collapse 02/23/2014  . Pain in limb 06/07/2013  . PAD (peripheral artery disease) ([place]) 06/01/2013  . Syncope 04/02/2012  . NEOPLASMS UNSPEC NATURE BONE SOFT TISSUE&SKIN 05/31/2008  . Dyslipidemia 06/10/2006  . Essential hypertension 06/10/2006  . Osteoporosis 06/10/2006  . BREAST BIOPSY, HX OF 06/10/2006    Goals Addressed   None     Follow-Up:  Pharmacist Review  Maia Breslow, [place] Assistant [phone]

## 2019-10-12 ENCOUNTER — Ambulatory Visit: Payer: Medicare HMO

## 2019-10-12 DIAGNOSIS — I10 Essential (primary) hypertension: Secondary | ICD-10-CM

## 2019-10-12 DIAGNOSIS — E785 Hyperlipidemia, unspecified: Secondary | ICD-10-CM

## 2019-10-16 NOTE — Patient Instructions (Addendum)
Hi Tammy [name]!  It was so lovely talking with you on the phone and getting to know you a little bit! You are doing great at taking care of yourself and keep up the good work. As mentioned during the call, go ahead and check your blood pressure at least weekly and I will have my assistant Tammy [name] reach out to you in 2 months just to make sure your medicines are working.  Please don't hesitate to reach out to me sooner if you need anything!  Thank you, Tammy [name]  Tammy [name], PharmD Clinical Pharmacist [place] [facility]   Visit Information  Goals Addressed            This Visit's Progress   . Pharmacy care plan       CARE PLAN ENTRY (see longitudinal plan of care for additional care plan information)  Current Barriers:  . Chronic Disease Management support, education, and care coordination needs related to Hypertension, Hyperlipidemia, Atrial Fibrillation, Hypothyroidism, Osteoporosis, and prediabetes   Hypertension BP Readings from Last 3 Encounters:  06/29/19 (!) 150/60  06/05/19 136/66  04/17/19 (!) 148/54   . Pharmacist Clinical Goal(s): o Over the next 60 days, patient will work with PharmD and providers to maintain BP goal <140/90 . Current regimen:  . Metoprolol succinate 25 mg 1 tablet daily . Amlodipine 10 mg 1 tablet daily . Interventions: o We discussed the importance of checking your blood pressure at home in order to see how well your medicines are working . Patient self care activities - Over the next 60 days, patient will: o Check blood pressure at least weekly, document, and provide at future appointments o Ensure daily salt intake < 2300 mg/day  Hyperlipidemia Lab Results  Component Value Date/Time   LDLCALC 43 06/29/2019 08:21 AM   LDLCALC 48 11/25/2016 10:04 AM   . Pharmacist Clinical Goal(s): o Over the next 120 days, patient will work with PharmD and providers to maintain LDL goal < 70 . Current regimen:  o Atorvastatin 20  mg 1 tablet daily . Patient self care activities - Over the next 120 days, patient will: o Continue taking your medicines as prescribed  Pre-Diabetes Lab Results  Component Value Date/Time   HGBA1C 6.5 06/29/2019 08:21 AM   HGBA1C 6.3 (H) 04/02/2012 04:20 PM   . Pharmacist Clinical Goal(s): o Over the next 120 days, patient will work with PharmD and providers to maintain A1c goal <6.5% . Current regimen:  o No medications . Interventions: o We discussed following the healthy plate guidelines of filling half the plate with non-starchy vegetables o Will provide healthy plate handout in mail . Patient self care activities - Over the next 120 days, patient will: o Limiting sweets and making sure to eat more vegetables to lower blood sugars  Osteoporosis . Pharmacist Clinical Goal(s) o Over the next 120 days, patient will work with PharmD and providers to maintain bone health . Current regimen:  o No medications . Interventions: o We discussed getting the recommended [phone] units of vitamin D daily and 1200 mg of calcium daily from dietary and supplemental sources. . Patient self care activities - Over the next 120 days, patient will: o Incorporate more foods with calcium and vitamin D and supplement as needed to get the recommended amount  Pulmonary hypertension . Pharmacist Clinical Goal(s) o Over the next 120 days, patient will work with PharmD and providers to maintain bone health . Current regimen:  . Furosemide 20 mg 1 tablet daily .  Furosemide 40 mg 1 tablet alternating with 1/2 tablet every other day . Interventions: o We discussed checking package labels for sodium and trying to limit sodium intake to < 1500 mg/day o We discussed elevating your feet if you notice swelling . Patient self care activities - Over the next 120 days, patient will: o Continue to monitor sodium intake and to aim for < 1500 mg/day o Reach out to cardiologist about the need to weigh herself  daily  Medication management . Pharmacist Clinical Goal(s): o Over the next 120 days, patient will work with PharmD and providers to maintain optimal medication adherence . Current pharmacy: [facility] . Interventions o Comprehensive medication review performed. o Continue current medication management strategy . Patient self care activities - Over the next 120 days, patient will: o Take medications as prescribed o Report any questions or concerns to PharmD and/or provider(s)  Initial goal documentation        Ms. Vanderlinden was given information about Chronic Care Management services today including:  1. CCM service includes personalized support from designated clinical staff supervised by her physician, including individualized plan of care and coordination with other care providers 2. 24/7 contact phone numbers for assistance for urgent and routine care needs. 3. Standard insurance, coinsurance, copays and deductibles apply for chronic care management only during months in which we provide at least 20 minutes of these services. Most insurances cover these services at 100%, however patients may be responsible for any copay, coinsurance and/or deductible if applicable. This service may help you avoid the need for more expensive face-to-face services. 4. Only one practitioner may furnish and bill the service in a calendar month. 5. The patient may stop CCM services at any time (effective at the end of the month) by phone call to the office staff.  Patient agreed to services and verbal consent obtained.   The patient verbalized understanding of instructions provided today and agreed to receive a mailed copy of patient instruction and/or educational materials. Telephone follow up appointment with pharmacy team member scheduled for: 4 months  Tammy [name], PharmD Clinical Pharmacist [place] [facility]    Prediabetes Eating Plan Prediabetes is a condition that causes  blood sugar (glucose) levels to be higher than normal. This increases the risk for developing diabetes. In order to prevent diabetes from developing, your health care provider may recommend a diet and other lifestyle changes to help you:  Control your blood glucose levels.  Improve your cholesterol levels.  Manage your blood pressure. Your health care provider may recommend working with a diet and nutrition specialist (dietitian) to make a meal plan that is best for you. What are tips for following this plan? Lifestyle  Set weight loss goals with the help of your health care team. It is recommended that most people with prediabetes lose 7% of their current body weight.  Exercise for at least 30 minutes at least 5 days a week.  Attend a support group or seek ongoing support from a mental health counselor.  Take over-the-counter and prescription medicines only as told by your health care provider. Reading food labels  Read food labels to check the amount of fat, salt (sodium), and sugar in prepackaged foods. Avoid foods that have: ? Saturated fats. ? Trans fats. ? Added sugars.  Avoid foods that have more than 300 milligrams (mg) of sodium per serving. Limit your daily sodium intake to less than 2,300 mg each day. Shopping  Avoid buying pre-made and processed foods.  Cooking  Cook with olive oil. Do not use butter, lard, or ghee.  Bake, broil, grill, or boil foods. Avoid frying. Meal planning   Work with your dietitian to develop an eating plan that is right for you. This may include: ? Tracking how many calories you take in. Use a food diary, notebook, or mobile application to track what you eat at each meal. ? Using the glycemic index (GI) to plan your meals. The index tells you how quickly a food will raise your blood glucose. Choose low-GI foods. These foods take a longer time to raise blood glucose.  Consider following a Mediterranean diet. This diet includes: ? Several  servings each day of fresh fruits and vegetables. ? Eating fish at least twice a week. ? Several servings each day of whole grains, beans, nuts, and seeds. ? Using olive oil instead of other fats. ? Moderate alcohol consumption. ? Eating small amounts of red meat and whole-fat dairy.  If you have high blood pressure, you may need to limit your sodium intake or follow a diet such as the DASH eating plan. DASH is an eating plan that aims to lower high blood pressure. What foods are recommended? The items listed below may not be a complete list. Talk with your dietitian about what dietary choices are best for you. Grains Whole grains, such as whole-wheat or whole-grain breads, crackers, cereals, and pasta. Unsweetened oatmeal. Bulgur. Barley. Quinoa. Brown rice. Corn or whole-wheat flour tortillas or taco shells. Vegetables Lettuce. Spinach. Peas. Beets. Cauliflower. Cabbage. Broccoli. Carrots. Tomatoes. Squash. Eggplant. Herbs. Peppers. Onions. Cucumbers. Brussels sprouts. Fruits Berries. Bananas. Apples. Oranges. Grapes. Papaya. Mango. Pomegranate. Kiwi. Grapefruit. Cherries. Meats and other protein foods Seafood. Poultry without skin. Lean cuts of pork and beef. Tofu. Eggs. Nuts. Beans. Dairy Low-fat or fat-free dairy products, such as yogurt, cottage cheese, and cheese. Beverages Water. Tea. Coffee. Sugar-free or diet soda. Seltzer water. Lowfat or no-fat milk. Milk alternatives, such as soy or almond milk. Fats and oils Olive oil. Canola oil. Sunflower oil. Grapeseed oil. Avocado. Walnuts. Sweets and desserts Sugar-free or low-fat pudding. Sugar-free or low-fat ice cream and other frozen treats. Seasoning and other foods Herbs. Sodium-free spices. Mustard. Relish. Low-fat, low-sugar ketchup. Low-fat, low-sugar barbecue sauce. Low-fat or fat-free mayonnaise. What foods are not recommended? The items listed below may not be a complete list. Talk with your dietitian about what dietary  choices are best for you. Grains Refined white flour and flour products, such as bread, pasta, snack foods, and cereals. Vegetables Canned vegetables. Frozen vegetables with butter or cream sauce. Fruits Fruits canned with syrup. Meats and other protein foods Fatty cuts of meat. Poultry with skin. Breaded or fried meat. Processed meats. Dairy Full-fat yogurt, cheese, or milk. Beverages Sweetened drinks, such as sweet iced tea and soda. Fats and oils Butter. Lard. Ghee. Sweets and desserts Baked goods, such as cake, cupcakes, pastries, cookies, and cheesecake. Seasoning and other foods Spice mixes with added salt. Ketchup. Barbecue sauce. Mayonnaise. Summary  To prevent diabetes from developing, you may need to make diet and other lifestyle changes to help control blood sugar, improve cholesterol levels, and manage your blood pressure.  Set weight loss goals with the help of your health care team. It is recommended that most people with prediabetes lose 7 percent of their current body weight.  Consider following a Mediterranean diet that includes plenty of fresh fruits and vegetables, whole grains, beans, nuts, seeds, fish, lean meat, low-fat dairy, and healthy oils. This  information is not intended to replace advice given to you by your health care provider. Make sure you discuss any questions you have with your health care provider. Document Revised: 05/06/2018 Document Reviewed: 03/18/2016 Elsevier Patient Education  2020 [employer].

## 2019-10-17 NOTE — Progress Notes (Signed)
Hi [name],   Thank you for catching this.  I will address it with her in December.  In the meantime I also recommend supplementing with calcium and vitamin D.  Tommi Rumps

## 2019-10-23 NOTE — Progress Notes (Signed)
HPI: FU syncope, ASDand atrial fibrillation. Patient with history of syncope felt secondary to autonomic dysfunction. Previously loop monitor considered; Dr Lovena Le felt it would be indicated if recurrent syncope. H/O pulmonary embolus; seen by pulmonary and chronic anticoagulation felt indicated.Holter monitor November 2018 showed atrial fibrillation with PVCs or aberrantly conducted beats rate controlled.Patient previously placed on amiodarone and underwent successful cardioversion to sinus rhythm February 2019. Echocardiogram August 2020 showed normal LV function, severe RV dysfunction, severe right ventricular enlargement, moderate left atrial enlargement, severe right atrial enlargement, severe tricuspid regurgitation, mild aortic insufficiency. Follow-up study September 2020 showed moderate to large secundum atrial septal defect with bidirectional shunting.  ABIs May 2021 normal on the right and severe on the left. Peripheral vascular disease followed by Dr. Scot Dock. Since last seen,she does have dyspnea on exertion.  She has mild pedal edema that improves with elevating her legs.  She denies chest pain or syncope.  Current Outpatient Medications  Medication Sig Dispense Refill   acetaminophen (TYLENOL) 650 MG CR tablet Take 1,300 mg by mouth every 8 (eight) hours as needed for pain.     amiodarone (PACERONE) 200 MG tablet TAKE ONE TABLET TWICE DAILY FOR ONE WEEK THEN DECREASE TO 1 TABLET DAILY 135 tablet 3   amLODipine (NORVASC) 10 MG tablet TAKE 1 TABLET BY MOUTH EVERY DAY 90 tablet 3   apixaban (ELIQUIS) 2.5 MG TABS tablet Take 1 tablet (2.5 mg total) by mouth 2 (two) times daily. 60 tablet    atorvastatin (LIPITOR) 20 MG tablet TAKE 1 TABLET (20 MG TOTAL) BY MOUTH DAILY AT 6 PM. 90 tablet 3   furosemide (LASIX) 20 MG tablet TAKE 1 TABLET BY MOUTH EVERY DAY 90 tablet 3   furosemide (LASIX) 40 MG tablet Take 1 tablet alternating with 1/2 tablet every other day 90 tablet 3    levothyroxine (SYNTHROID) 25 MCG tablet Take 1 tablet (25 mcg total) by mouth daily. 90 tablet 3   methocarbamol (ROBAXIN) 500 MG tablet Take 1 tablet (500 mg total) by mouth every 6 (six) hours as needed for muscle spasms. 60 tablet 1   metoprolol succinate (TOPROL-XL) 25 MG 24 hr tablet TAKE 1 TABLET BY MOUTH EVERY DAY 90 tablet 3   No current facility-administered medications for this visit.     Past Medical History:  Diagnosis Date   Arthritis    BREAST BIOPSY, HX OF 06/10/2006   Breast cyst    Dysrhythmia    HIP PAIN, LEFT 01/31/2007   HYPERLIPIDEMIA 06/10/2006   HYPERTENSION 06/10/2006   Impaired glucose tolerance    Lower extremity edema    NEOPLASMS UNSPEC NATURE BONE SOFT TISSUE&SKIN 05/31/2008   OSTEOPOROSIS 06/10/2006   Pulmonary embolus ([place])     Past Surgical History:  Procedure Laterality Date   ABDOMINAL HYSTERECTOMY     APPENDECTOMY     BREAST SURGERY     bx x2   CARDIOVERSION N/A 03/11/2017   Procedure: CARDIOVERSION;  Surgeon: Sanda Klein, MD;  Location: [facility];  Service: Cardiovascular;  Laterality: N/A;   CHOLECYSTECTOMY  1980   TRANSFORAMINAL LUMBAR INTERBODY FUSION (TLIF) WITH PEDICLE SCREW FIXATION 1 LEVEL Left 10/27/2018   Procedure: Left Lumbar five-sacral one Transforaminal lumbar interbody fusion;  Surgeon: Erline Levine, MD;  Location: [facility];  Service: Neurosurgery;  Laterality: Left;    Social History   Socioeconomic History   Marital status: Married    Spouse name: Not on file   Number of children: 4   Years  of education: Not on file   Highest education level: Not on file  Occupational History   Not on file  Tobacco Use   Smoking status: Never Smoker   Smokeless tobacco: Current User    Types: Snuff  Vaping Use   Vaping Use: Never used  Substance and Sexual Activity   Alcohol use: No    Alcohol/week: 0.0 standard drinks   Drug use: No   Sexual activity: Not Currently  Other Topics Concern   Not on  file  Social History Narrative   She is retired    Worked in [employer] for 20 years    Married for 43 years    4 children - live locally.        Social Determinants of Health   Financial Resource Strain:    Difficulty of Paying Living Expenses: Not on file  Food Insecurity:    Worried About Charity fundraiser in the Last Year: Not on file   [employer] of Food in the Last Year: Not on file  Transportation Needs:    Lack of Transportation (Medical): Not on file   Lack of Transportation (Non-Medical): Not on file  Physical Activity:    Days of Exercise per Week: Not on file   Minutes of Exercise per Session: Not on file  Stress:    Feeling of Stress : Not on file  Social Connections:    Frequency of Communication with Friends and Family: Not on file   Frequency of Social Gatherings with Friends and Family: Not on file   Attends Religious Services: Not on file   Active Member of Clubs or Organizations: Not on file   Attends Archivist Meetings: Not on file   Marital Status: Not on file  Intimate Partner Violence:    Fear of Current or Ex-Partner: Not on file   Emotionally Abused: Not on file   Physically Abused: Not on file   Sexually Abused: Not on file    Family History  Problem Relation Age of Onset   Heart disease Mother        Pacemaker   Heart disease Father    Asthma Father    Diabetes Father    Cancer Maternal Aunt        breast   Heart disease Sister    Hyperlipidemia Sister    Hypertension Sister    Lung cancer Sister     ROS: no fevers or chills, productive cough, hemoptysis, dysphasia, odynophagia, melena, hematochezia, dysuria, hematuria, rash, seizure activity, orthopnea, PND, pedal edema, claudication. Remaining systems are negative.  Physical Exam: Well-developed well-nourished in no acute distress.  Skin is warm and dry.  HEENT is normal.  Neck is supple.  Chest is clear to auscultation with normal  expansion.  Cardiovascular exam is irregular Abdominal exam nontender or distended. No masses palpated. Extremities show n1+ ankle edema. neuro grossly intact  ECG-sinus bradycardia with first-degree AV block, right bundle branch block, cannot rule out septal infarct.  Personally reviewed  A/P  1 paroxysmal atrial fibrillation-patient is in sinus rhythm today.  Continue amiodarone and apixaban.  Plan to check TSH, liver functions and chest x-ray.  Given bradycardia and long first-degree AV block will discontinue Toprol.  2 hypertension-blood pressure controlled today.  Continue present medications other than Toprol which is being discontinued due to bradycardia..  3 previous pulmonary embolus-long-term anticoagulation recommended by pulmonary.  Continue apixaban.  4 hyperlipidemia-continue statin.  5 history of syncope-patient has not had recurrences.  6 tricuspid regurgitation-plan conservative measures given patient's age.  7 peripheral vascular disease-patient denies claudication.  Continue medical therapy with statin.  8 atrial septal defect-we previously discussed the options.  Patient wants only conservative measures given age and overall medical condition and I think this is appropriate.  9 chronic right-sided heart failure-she appears to be reasonably well controlled with present dose of diuretics.  We will continue.  10 chronic stage III kidney disease-check potassium and renal function.  Kirk Ruths, MD

## 2019-10-30 ENCOUNTER — Other Ambulatory Visit: Payer: Self-pay

## 2019-10-30 ENCOUNTER — Encounter: Payer: Self-pay | Admitting: Cardiology

## 2019-10-30 ENCOUNTER — Ambulatory Visit
Admission: RE | Admit: 2019-10-30 | Discharge: 2019-10-30 | Disposition: A | Discharge status: Home / Self Care | Payer: Medicare HMO | Source: Ambulatory Visit | Attending: Cardiology | Admitting: Cardiology

## 2019-10-30 ENCOUNTER — Ambulatory Visit (INDEPENDENT_AMBULATORY_CARE_PROVIDER_SITE_OTHER): Payer: Medicare HMO | Admitting: Cardiology

## 2019-10-30 VITALS — BP 146/60 | HR 51 | Ht 67.0 in | Wt 174.6 lb

## 2019-10-30 DIAGNOSIS — N183 Chronic kidney disease, stage 3 unspecified: Secondary | ICD-10-CM | POA: Diagnosis not present

## 2019-10-30 DIAGNOSIS — I10 Essential (primary) hypertension: Secondary | ICD-10-CM | POA: Diagnosis not present

## 2019-10-30 DIAGNOSIS — Z5181 Encounter for therapeutic drug level monitoring: Secondary | ICD-10-CM | POA: Diagnosis not present

## 2019-10-30 DIAGNOSIS — I517 Cardiomegaly: Secondary | ICD-10-CM | POA: Diagnosis not present

## 2019-10-30 DIAGNOSIS — J984 Other disorders of lung: Secondary | ICD-10-CM | POA: Diagnosis not present

## 2019-10-30 DIAGNOSIS — Q211 Atrial septal defect, unspecified: Secondary | ICD-10-CM

## 2019-10-30 DIAGNOSIS — I480 Paroxysmal atrial fibrillation: Secondary | ICD-10-CM | POA: Diagnosis not present

## 2019-10-30 DIAGNOSIS — I50812 Chronic right heart failure: Secondary | ICD-10-CM | POA: Diagnosis not present

## 2019-10-30 DIAGNOSIS — R918 Other nonspecific abnormal finding of lung field: Secondary | ICD-10-CM | POA: Diagnosis not present

## 2019-10-30 DIAGNOSIS — I130 Hypertensive heart and chronic kidney disease with heart failure and stage 1 through stage 4 chronic kidney disease, or unspecified chronic kidney disease: Secondary | ICD-10-CM | POA: Diagnosis not present

## 2019-10-30 LAB — HEPATIC FUNCTION PANEL
ALT: 30 IU/L (ref 0–32)
AST: 36 IU/L (ref 0–40)
Albumin: 4.1 g/dL (ref 3.6–4.6)
Alkaline Phosphatase: 103 IU/L (ref 44–121)
Bilirubin Total: 0.6 mg/dL (ref 0.0–1.2)
Bilirubin, Direct: 0.2 mg/dL (ref 0.00–0.40)
Total Protein: 7.3 g/dL (ref 6.0–8.5)

## 2019-10-30 LAB — BASIC METABOLIC PANEL
BUN/Creatinine Ratio: 17 (ref 12–28)
BUN: 33 mg/dL — ABNORMAL HIGH (ref 8–27)
CO2: 23 mmol/L (ref 20–29)
Calcium: 9.5 mg/dL (ref 8.7–10.3)
Chloride: 106 mmol/L (ref 96–106)
Creatinine, Ser: 1.95 mg/dL — ABNORMAL HIGH (ref 0.57–1.00)
GFR calc Af Amer: 26 mL/min/{1.73_m2} — ABNORMAL LOW (ref >59)
GFR calc non Af Amer: 23 mL/min/{1.73_m2} — ABNORMAL LOW (ref >59)
Glucose: 118 mg/dL — ABNORMAL HIGH (ref 65–99)
Potassium: 5.1 mmol/L (ref 3.5–5.2)
Sodium: 142 mmol/L (ref 134–144)

## 2019-10-30 LAB — TSH: TSH: 2.62 u[IU]/mL (ref 0.450–4.500)

## 2019-10-30 NOTE — Patient Instructions (Signed)
Medication Instructions:   STOP METOPROLOL  *If you need a refill on your cardiac medications before your next appointment, please call your pharmacy*   Lab Work:  Your physician recommends that you HAVE LAB WORK TODAY  If you have labs (blood work) drawn today and your tests are completely normal, you will receive your results only by: Marland Kitchen MyChart Message (if you have MyChart) OR . A paper copy in the mail If you have any lab test that is abnormal or we need to change your treatment, we will call you to review the results.   Testing/Procedures:  A chest x-ray takes a picture of the organs and structures inside the chest, including the heart, lungs, and blood vessels. This test can show several things, including, whether the heart is enlarges; whether fluid is building up in the lungs; and whether pacemaker / defibrillator leads are still in place. [address]    Follow-Up: At [facility], you and your health needs are our priority.  As part of our continuing mission to provide you with exceptional heart care, we have created designated Provider Care Teams.  These Care Teams include your primary Cardiologist (physician) and Advanced Practice Providers (APPs -  Physician Assistants and Nurse Practitioners) who all work together to provide you with the care you need, when you need it.  We recommend signing up for the patient portal called "MyChart".  Sign up information is provided on this After Visit Summary.  MyChart is used to connect with patients for Virtual Visits (Telemedicine).  Patients are able to view lab/test results, encounter notes, upcoming appointments, etc.  Non-urgent messages can be sent to your provider as well.   To learn more about what you can do with MyChart, go to [email or web address].    Your next appointment:   6 month(s)  The format for your next appointment:   In Person  Provider:   You may see Kirk Ruths, MD or one of  the following Advanced Practice Providers on your designated Care Team:    Kerin Ransom, PA-C  [place], [place]  Coletta Memos, [place]

## 2019-10-31 ENCOUNTER — Encounter: Payer: Self-pay | Admitting: *Deleted

## 2019-11-22 ENCOUNTER — Ambulatory Visit (INDEPENDENT_AMBULATORY_CARE_PROVIDER_SITE_OTHER): Payer: Medicare HMO

## 2019-11-22 ENCOUNTER — Other Ambulatory Visit: Payer: Self-pay

## 2019-11-22 DIAGNOSIS — Z0000 Encounter for general adult medical examination without abnormal findings: Secondary | ICD-10-CM | POA: Diagnosis not present

## 2019-11-22 NOTE — Patient Instructions (Signed)
Tammy [name] , Thank you for taking time to come for your Medicare Wellness Visit. I appreciate your ongoing commitment to your health goals. Please review the following plan we discussed and let me know if I can assist you in the future.   Screening recommendations/referrals: Colonoscopy: No longer required  Mammogram: No longer required Bone Density: No longer required Recommended yearly ophthalmology/optometry visit for glaucoma screening and checkup Recommended yearly dental visit for hygiene and checkup  Vaccinations: Influenza vaccine: Up to date, next due fall 2022 Pneumococcal vaccine: Completed series Tdap vaccine: Up to date, next due 06/12/2020 Shingles vaccine: Currently due for Shingrix, If you wish to receive please contact your pharmacy to discuss cost and to receive     Advanced directives: Advance directive discussed with you today. Even though you declined this today please call our office should you change your mind and we can give you the proper paperwork for you to fill out.   Conditions/risks identified: None   Next appointment: 01/02/2020 @ 9:30 am with [place] 65 Years and Older, Female Preventive care refers to lifestyle choices and visits with your health care provider that can promote health and wellness. What does preventive care include?  A yearly physical exam. This is also called an annual well check.  Dental exams once or twice a year.  Routine eye exams. Ask your health care provider how often you should have your eyes checked.  Personal lifestyle choices, including:  Daily care of your teeth and gums.  Regular physical activity.  Eating a healthy diet.  Avoiding tobacco and drug use.  Limiting alcohol use.  Practicing safe sex.  Taking low-dose aspirin every day.  Taking vitamin and mineral supplements as recommended by your health care provider. What happens during an annual well check? The services and  screenings done by your health care provider during your annual well check will depend on your age, overall health, lifestyle risk factors, and family history of disease. Counseling  Your health care provider may ask you questions about your:  Alcohol use.  Tobacco use.  Drug use.  Emotional well-being.  Home and relationship well-being.  Sexual activity.  Eating habits.  History of falls.  Memory and ability to understand (cognition).  Work and work Statistician.  Reproductive health. Screening  You may have the following tests or measurements:  Height, weight, and BMI.  Blood pressure.  Lipid and cholesterol levels. These may be checked every 5 years, or more frequently if you are over 84 years old.  Skin check.  Lung cancer screening. You may have this screening every year starting at age 62 if you have a 30-pack-year history of smoking and currently smoke or have quit within the past 15 years.  Fecal occult blood test (FOBT) of the stool. You may have this test every year starting at age 22.  Flexible sigmoidoscopy or colonoscopy. You may have a sigmoidoscopy every 5 years or a colonoscopy every 10 years starting at age 50.  Hepatitis C blood test.  Hepatitis B blood test.  Sexually transmitted disease (STD) testing.  Diabetes screening. This is done by checking your blood sugar (glucose) after you have not eaten for a while (fasting). You may have this done every 1-3 years.  Bone density scan. This is done to screen for osteoporosis. You may have this done starting at age 84.  Mammogram. This may be done every 1-2 years. Talk to your health care provider about how often you  should have regular mammograms. Talk with your health care provider about your test results, treatment options, and if necessary, the need for more tests. Vaccines  Your health care provider may recommend certain vaccines, such as:  Influenza vaccine. This is recommended every  year.  Tetanus, diphtheria, and acellular pertussis (Tdap, Td) vaccine. You may need a Td booster every 10 years.  Zoster vaccine. You may need this after age 84.  Pneumococcal 13-valent conjugate (PCV13) vaccine. One dose is recommended after age 84.  Pneumococcal polysaccharide (PPSV23) vaccine. One dose is recommended after age 84. Talk to your health care provider about which screenings and vaccines you need and how often you need them. This information is not intended to replace advice given to you by your health care provider. Make sure you discuss any questions you have with your health care provider. Document Released: 02/08/2015 Document Revised: 10/02/2015 Document Reviewed: 11/13/2014 Elsevier Interactive Patient Education  2017 [employer] Prevention in the Home Falls can cause injuries. They can happen to people of all ages. There are many things you can do to make your home safe and to help prevent falls. What can I do on the outside of my home?  Regularly fix the edges of walkways and driveways and fix any cracks.  Remove anything that might make you trip as you walk through a door, such as a raised step or threshold.  Trim any bushes or trees on the path to your home.  Use bright outdoor lighting.  Clear any walking paths of anything that might make someone trip, such as rocks or tools.  Regularly check to see if handrails are loose or broken. Make sure that both sides of any steps have handrails.  Any raised decks and porches should have guardrails on the edges.  Have any leaves, snow, or ice cleared regularly.  Use sand or salt on walking paths during winter.  Clean up any spills in your garage right away. This includes oil or grease spills. What can I do in the bathroom?  Use night lights.  Install grab bars by the toilet and in the tub and shower. Do not use towel bars as grab bars.  Use non-skid mats or decals in the tub or shower.  If you  need to sit down in the shower, use a plastic, non-slip stool.  Keep the floor dry. Clean up any water that spills on the floor as soon as it happens.  Remove soap buildup in the tub or shower regularly.  Attach bath mats securely with double-sided non-slip rug tape.  Do not have throw rugs and other things on the floor that can make you trip. What can I do in the bedroom?  Use night lights.  Make sure that you have a light by your bed that is easy to reach.  Do not use any sheets or blankets that are too big for your bed. They should not hang down onto the floor.  Have a firm chair that has side arms. You can use this for support while you get dressed.  Do not have throw rugs and other things on the floor that can make you trip. What can I do in the kitchen?  Clean up any spills right away.  Avoid walking on wet floors.  Keep items that you use a lot in easy-to-reach places.  If you need to reach something above you, use a strong step stool that has a grab bar.  Keep electrical cords out  of the way.  Do not use floor polish or wax that makes floors slippery. If you must use wax, use non-skid floor wax.  Do not have throw rugs and other things on the floor that can make you trip. What can I do with my stairs?  Do not leave any items on the stairs.  Make sure that there are handrails on both sides of the stairs and use them. Fix handrails that are broken or loose. Make sure that handrails are as long as the stairways.  Check any carpeting to make sure that it is firmly attached to the stairs. Fix any carpet that is loose or worn.  Avoid having throw rugs at the top or bottom of the stairs. If you do have throw rugs, attach them to the floor with carpet tape.  Make sure that you have a light switch at the top of the stairs and the bottom of the stairs. If you do not have them, ask someone to add them for you. What else can I do to help prevent falls?  Wear shoes  that:  Do not have high heels.  Have rubber bottoms.  Are comfortable and fit you well.  Are closed at the toe. Do not wear sandals.  If you use a stepladder:  Make sure that it is fully opened. Do not climb a closed stepladder.  Make sure that both sides of the stepladder are locked into place.  Ask someone to hold it for you, if possible.  Clearly mark and make sure that you can see:  Any grab bars or handrails.  First and last steps.  Where the edge of each step is.  Use tools that help you move around (mobility aids) if they are needed. These include:  Canes.  Walkers.  Scooters.  Crutches.  Turn on the lights when you go into a dark area. Replace any light bulbs as soon as they burn out.  Set up your furniture so you have a clear path. Avoid moving your furniture around.  If any of your floors are uneven, fix them.  If there are any pets around you, be aware of where they are.  Review your medicines with your doctor. Some medicines can make you feel dizzy. This can increase your chance of falling. Ask your doctor what other things that you can do to help prevent falls. This information is not intended to replace advice given to you by your health care provider. Make sure you discuss any questions you have with your health care provider. Document Released: 11/08/2008 Document Revised: 06/20/2015 Document Reviewed: 02/16/2014 Elsevier Interactive Patient Education  2017 [employer].

## 2019-11-22 NOTE — Progress Notes (Signed)
Subjective:   Tammy [name] is a 86 y.o. female who presents for Medicare Annual (Subsequent) preventive examination.  I connected with Tammy [name] today by telephone and verified that I am speaking with the correct person using two identifiers. Location patient: home Location provider: work Persons participating in the virtual visit: patient, provider.   I discussed the limitations, risks, security and privacy concerns of performing an evaluation and management service by telephone and the availability of in person appointments. I also discussed with the patient that there may be a patient responsible charge related to this service. The patient expressed understanding and verbally consented to this telephonic visit.    Interactive audio and video telecommunications were attempted between this provider and patient, however failed, due to patient having technical difficulties OR patient did not have access to video capability.  We continued and completed visit with audio only.     Review of Systems    N/A  Cardiac Risk Factors include: advanced age (>71men, >47 women);hypertension;dyslipidemia     Objective:    Today's Vitals   There is no height or weight on file to calculate BMI.  Advanced Directives 11/22/2019 10/25/2018 06/07/2018 06/02/2018 12/28/2017 08/28/2017 04/22/2016  Does Patient Have a Medical Advance Directive? No No No No No No No  Would patient like information on creating a medical advance directive? No - Patient declined No - Patient declined No - Patient declined No - Patient declined No - Patient declined - No - Patient declined    Current Medications (verified) Outpatient Encounter Medications as of 11/22/2019  Medication Sig  . acetaminophen (TYLENOL) 650 MG CR tablet Take 1,300 mg by mouth every 8 (eight) hours as needed for pain.  Marland Kitchen amiodarone (PACERONE) 200 MG tablet TAKE ONE TABLET TWICE DAILY FOR ONE WEEK THEN DECREASE TO 1 TABLET DAILY  . amLODipine (NORVASC) 10  MG tablet TAKE 1 TABLET BY MOUTH EVERY DAY  . apixaban (ELIQUIS) 2.5 MG TABS tablet Take 1 tablet (2.5 mg total) by mouth 2 (two) times daily.  Marland Kitchen atorvastatin (LIPITOR) 20 MG tablet TAKE 1 TABLET (20 MG TOTAL) BY MOUTH DAILY AT 6 PM.  . furosemide (LASIX) 20 MG tablet TAKE 1 TABLET BY MOUTH EVERY DAY  . furosemide (LASIX) 40 MG tablet Take 1 tablet alternating with 1/2 tablet every other day  . levothyroxine (SYNTHROID) 25 MCG tablet Take 1 tablet (25 mcg total) by mouth daily.  . methocarbamol (ROBAXIN) 500 MG tablet Take 1 tablet (500 mg total) by mouth every 6 (six) hours as needed for muscle spasms. (Patient not taking: Reported on 11/22/2019)  . metoprolol succinate (TOPROL-XL) 25 MG 24 hr tablet TAKE 1 TABLET BY MOUTH EVERY DAY (Patient not taking: Reported on 11/22/2019)   No facility-administered encounter medications on file as of 11/22/2019.    Allergies (verified) Tuberculin tests   History: Past Medical History:  Diagnosis Date  . Arthritis   . BREAST BIOPSY, HX OF 06/10/2006  . Breast cyst   . Dysrhythmia   . HIP PAIN, LEFT 01/31/2007  . HYPERLIPIDEMIA 06/10/2006  . HYPERTENSION 06/10/2006  . Impaired glucose tolerance   . Lower extremity edema   . NEOPLASMS UNSPEC NATURE BONE SOFT TISSUE&SKIN 05/31/2008  . OSTEOPOROSIS 06/10/2006  . Pulmonary embolus [facility])    Past Surgical History:  Procedure Laterality Date  . ABDOMINAL HYSTERECTOMY    . APPENDECTOMY    . BREAST SURGERY     bx x2  . CARDIOVERSION N/A 03/11/2017   Procedure: CARDIOVERSION;  Surgeon: Sanda Klein, MD;  Location: [facility] ENDOSCOPY;  Service: Cardiovascular;  Laterality: N/A;  . CHOLECYSTECTOMY  1980  . TRANSFORAMINAL LUMBAR INTERBODY FUSION (TLIF) WITH PEDICLE SCREW FIXATION 1 LEVEL Left 10/27/2018   Procedure: Left Lumbar five-sacral one Transforaminal lumbar interbody fusion;  Surgeon: Erline Levine, MD;  Location: [facility];  Service: Neurosurgery;  Laterality: Left;   Family History  Problem Relation Age  of Onset  . Heart disease Mother        Pacemaker  . Heart disease Father   . Asthma Father   . Diabetes Father   . Cancer Maternal Aunt        breast  . Heart disease Sister   . Hyperlipidemia Sister   . Hypertension Sister   . Lung cancer Sister    Social History   Socioeconomic History  . Marital status: Married    Spouse name: Not on file  . Number of children: 4  . Years of education: Not on file  . Highest education level: Not on file  Occupational History  . Not on file  Tobacco Use  . Smoking status: Never Smoker  . Smokeless tobacco: Current User    Types: Snuff  Vaping Use  . Vaping Use: Never used  Substance and Sexual Activity  . Alcohol use: No    Alcohol/week: 0.0 standard drinks  . Drug use: No  . Sexual activity: Not Currently  Other Topics Concern  . Not on file  Social History Narrative   She is retired    Worked in [employer] for 20 years    Married for 43 years    4 children - live locally.        Social Determinants of Health   Financial Resource Strain: Low Risk   . Difficulty of Paying Living Expenses: Not hard at all  Food Insecurity: No Food Insecurity  . Worried About Charity fundraiser in the Last Year: Never true  . Ran Out of Food in the Last Year: Never true  Transportation Needs: No Transportation Needs  . Lack of Transportation (Medical): No  . Lack of Transportation (Non-Medical): No  Physical Activity: Inactive  . Days of Exercise per Week: 0 days  . Minutes of Exercise per Session: 0 min  Stress: No Stress Concern Present  . Feeling of Stress : Not at all  Social Connections: Moderately Integrated  . Frequency of Communication with Friends and Family: More than three times a week  . Frequency of Social Gatherings with Friends and Family: Once a week  . Attends Religious Services: More than 4 times per year  . Active Member of Clubs or Organizations: No  . Attends Archivist Meetings: Never  . Marital  Status: Married    Tobacco Counseling Ready to quit: Not Answered Counseling given: Not Answered   Clinical Intake:  Pre-visit preparation completed: Yes  Pain : No/denies pain     Nutritional Risks: Nausea/ vomitting/ diarrhea (Diarrhea) Diabetes: No (Prediabetic)  How often do you need to have someone help you when you read instructions, pamphlets, or other written materials from your doctor or pharmacy?: 3 - Sometimes What is the last grade level you completed in school?: 10th grade  Diabetic?No  Interpreter Needed?: No  Information entered by :: Onset of Daily Living In your present state of health, do you have any difficulty performing the following activities: 11/22/2019  Hearing? Y  Comment Patient states has difficulties with hearing, has to  ask people to repeat themsevles often  Vision? N  Difficulty concentrating or making decisions? Y  Comment Has difficulty remembering. States does not remember much  Walking or climbing stairs? Y  Comment Patient states has SOB, and leg weakness when climbing stairs  Dressing or bathing? Y  Comment Gets tired, and sob when bathing and dressing has to sit down  Doing errands, shopping? Y  Comment can not walk far and does not drive  Preparing Food and eating ? Y  Comment Has to sit down several times while preparing a meal before she finishes due to SOB and weakness  Using the Toilet? N  In the past six months, have you accidently leaked urine? N  Do you have problems with loss of bowel control? N  Managing your Medications? Y  Comment Husband does medications  Managing your Finances? N  Housekeeping or managing your Housekeeping? Y  Comment only able to do the laundry  Some recent data might be hidden    Patient Care Team: [name], NP as PCP - General (Family Medicine) Stanford Breed Denice Bors, MD as PCP - Cardiology (Cardiology) Viona Gilmore, [facility] as Pharmacist (Pharmacist)  Indicate any  recent Medical Services you may have received from other than [facility] providers in the past year (date may be approximate).     Assessment:   This is a routine wellness examination for [facility].  Hearing/Vision screen  Hearing Screening   125Hz  250Hz  500Hz  1000Hz  2000Hz  3000Hz  4000Hz  6000Hz  8000Hz   Right ear:           Left ear:           Vision Screening Comments: Patient states gets eyes examined once per year   Dietary issues and exercise activities discussed: Current Exercise Habits: The patient does not participate in regular exercise at present, Exercise limited by: respiratory conditions(s);cardiac condition(s)  Goals    . DIET - REDUCE SUGAR INTAKE    . Pharmacy care plan     CARE PLAN ENTRY (see longitudinal plan of care for additional care plan information)  Current Barriers:  . Chronic Disease Management support, education, and care coordination needs related to Hypertension, Hyperlipidemia, Atrial Fibrillation, Hypothyroidism, Osteoporosis, and prediabetes   Hypertension BP Readings from Last 3 Encounters:  06/29/19 (!) 150/60  06/05/19 136/66  04/17/19 (!) 148/54   . Pharmacist Clinical Goal(s): o Over the next 60 days, patient will work with PharmD and providers to maintain BP goal <140/90 . Current regimen:  . Metoprolol succinate 25 mg 1 tablet daily . Amlodipine 10 mg 1 tablet daily . Interventions: o We discussed the importance of checking your blood pressure at home in order to see how well your medicines are working . Patient self care activities - Over the next 60 days, patient will: o Check blood pressure at least weekly, document, and provide at future appointments o Ensure daily salt intake < 2300 mg/day  Hyperlipidemia Lab Results  Component Value Date/Time   LDLCALC 43 06/29/2019 08:21 AM   LDLCALC 48 11/25/2016 10:04 AM   . Pharmacist Clinical Goal(s): o Over the next 120 days, patient will work with PharmD and providers to maintain LDL goal <  70 . Current regimen:  o Atorvastatin 20 mg 1 tablet daily . Patient self care activities - Over the next 120 days, patient will: o Continue taking your medicines as prescribed  Pre-Diabetes Lab Results  Component Value Date/Time   HGBA1C 6.5 06/29/2019 08:21 AM   HGBA1C 6.3 (H) 04/02/2012  04:20 PM   . Pharmacist Clinical Goal(s): o Over the next 120 days, patient will work with PharmD and providers to maintain A1c goal <6.5% . Current regimen:  o No medications . Interventions: o We discussed following the healthy plate guidelines of filling half the plate with non-starchy vegetables o Will provide healthy plate handout in mail . Patient self care activities - Over the next 120 days, patient will: o Limiting sweets and making sure to eat more vegetables to lower blood sugars  Osteoporosis . Pharmacist Clinical Goal(s) o Over the next 120 days, patient will work with PharmD and providers to maintain bone health . Current regimen:  o No medications . Interventions: o We discussed getting the recommended [phone] units of vitamin D daily and 1200 mg of calcium daily from dietary and supplemental sources. . Patient self care activities - Over the next 120 days, patient will: o Incorporate more foods with calcium and vitamin D and supplement as needed to get the recommended amount  Pulmonary hypertension . Pharmacist Clinical Goal(s) o Over the next 120 days, patient will work with PharmD and providers to maintain bone health . Current regimen:  . Furosemide 20 mg 1 tablet daily . Furosemide 40 mg 1 tablet alternating with 1/2 tablet every other day . Interventions: o We discussed checking package labels for sodium and trying to limit sodium intake to < 1500 mg/day o We discussed elevating your feet if you notice swelling . Patient self care activities - Over the next 120 days, patient will: o Continue to monitor sodium intake and to aim for < 1500 mg/day o Reach out to  cardiologist about the need to weigh herself daily  Medication management . Pharmacist Clinical Goal(s): o Over the next 120 days, patient will work with PharmD and providers to maintain optimal medication adherence . Current pharmacy: [facility] . Interventions o Comprehensive medication review performed. o Continue current medication management strategy . Patient self care activities - Over the next 120 days, patient will: o Take medications as prescribed o Report any questions or concerns to PharmD and/or provider(s)  Initial goal documentation       Depression Screen PHQ 2/9 Scores 11/22/2019 06/29/2019 03/11/2018 03/10/2017 07/08/2015 07/04/2014 06/28/2013  PHQ - 2 Score 0 0 0 0 0 0 0  PHQ- 9 Score 0 - - - - - -    Fall Risk Fall Risk  11/22/2019 06/29/2019 03/11/2018 03/10/2017 07/08/2015  Falls in the past year? 0 0 - No Yes  Number falls in past yr: 0 - 1 - 1  Injury with Fall? 0 - - - No  Risk for fall due to : Impaired balance/gait - - - -  Follow up Falls evaluation completed;Falls prevention discussed - - - -    Any stairs in or around the home? No  If so, are there any without handrails? No  Home free of loose throw rugs in walkways, pet beds, electrical cords, etc? Yes  Adequate lighting in your home to reduce risk of falls? Yes   ASSISTIVE DEVICES UTILIZED TO PREVENT FALLS:  Life alert? No  Use of a cane, walker or w/c? Yes  Grab bars in the bathroom? Yes  Shower chair or bench in shower? No  Elevated toilet seat or a handicapped toilet? Yes   Cognitive Function:     6CIT Screen 11/22/2019  What Year? 0 points  What month? 0 points  What time? 0 points  Count back from 20 0 points  Months  in reverse 4 points  Repeat phrase 8 points  Total Score 12    Immunizations Immunization History  Administered Date(s) Administered  . Fluad Quad(high Dose 65+) 12/06/2018  . Influenza Split 12/15/2010, 12/14/2011, 10/27/2014  . Influenza Whole 10/09/2009  . Influenza, High  Dose Seasonal PF 12/13/2012, 11/04/2014, 10/28/2017, 11/06/2019  . Influenza,inj,Quad PF,6+ Mos 12/18/2013  . Influenza-Unspecified 11/26/2015, 10/30/2016  . PFIZER SARS-COV-2 Vaccination 04/22/2019, 05/13/2019, 11/06/2019  . Pneumococcal Conjugate-13 05/31/2013  . Pneumococcal Polysaccharide-23 06/13/2010  . Tdap 06/13/2010    TDAP status: Up to date Flu Vaccine status: Up to date Pneumococcal vaccine status: Up to date Covid-19 vaccine status: Completed vaccines  Qualifies for Shingles Vaccine? Yes   Zostavax completed No   Shingrix Completed?: No.    Education has been provided regarding the importance of this vaccine. Patient has been advised to call insurance company to determine out of pocket expense if they have not yet received this vaccine. Advised may also receive vaccine at local pharmacy or Health Dept. Verbalized acceptance and understanding.  Screening Tests Health Maintenance  Topic Date Due  . MAMMOGRAM  06/28/2020 (Originally 12/30/2016)  . DEXA SCAN  06/28/2020 (Originally 08/04/1998)  . TETANUS/TDAP  06/12/2020  . INFLUENZA VACCINE  Completed  . COVID-19 Vaccine  Completed  . PNA vac Low Risk Adult  Completed    Health Maintenance  There are no preventive care reminders to display for this patient.  Colorectal cancer screening: No longer required.  Mammogram status: No longer required.  Bone Density Status: No longer required  Lung Cancer Screening: (Low Dose CT Chest recommended if Age 74-80 years, 30 pack-year currently smoking OR have quit w/in 15years.) does not qualify.   Lung Cancer Screening Referral: N/A   Additional Screening:  Hepatitis C Screening: does not qualify;  Vision Screening: Recommended annual ophthalmology exams for early detection of glaucoma and other disorders of the eye. Is the patient up to date with their annual eye exam?  Yes  Who is the provider or what is the name of the office in which the patient attends annual eye exams?  Dr. [place] If pt is not established with a provider, would they like to be referred to a provider to establish care? No .   Dental Screening: Recommended annual dental exams for proper oral hygiene  Community Resource Referral / Chronic Care Management: CRR required this visit?  No   CCM required this visit?  No      Plan:     I have personally reviewed and noted the following in the patient's chart:   . Medical and social history . Use of alcohol, tobacco or illicit drugs  . Current medications and supplements . Functional ability and status . Nutritional status . Physical activity . Advanced directives . List of other physicians . Hospitalizations, surgeries, and ER visits in previous 12 months . Vitals . Screenings to include cognitive, depression, and falls . Referrals and appointments  In addition, I have reviewed and discussed with patient certain preventive protocols, quality metrics, and best practice recommendations. A written personalized care plan for preventive services as well as general preventive health recommendations were provided to patient.     Ofilia Neas, LPN   [id number]   Nurse Notes: Patient has some cognitive impairments, patient has noticed her memory issues. Please see 6CIT

## 2019-12-06 ENCOUNTER — Telehealth: Payer: Self-pay | Admitting: Cardiology

## 2019-12-06 NOTE — Telephone Encounter (Signed)
Spoke to patient she stated she was sob when she saw Dr.Crenshaw 10/30/19.Stated sob is getting worse.Weight stable.No chest pain.Stated she would like appointment with Dr.Crenshaw.Appointment scheduled 12/11/19 at 8:40 am.

## 2019-12-06 NOTE — Telephone Encounter (Signed)
Pt c/o Shortness Of Breath: STAT if SOB developed within the last 24 hours or pt is noticeably SOB on the phone  1. Are you currently SOB (can you hear that pt is SOB on the phone)? Yes   2. How long have you been experiencing SOB? 2 weeks, getting worse   3. Are you SOB when sitting or when up moving around? Moving around   4. Are you currently experiencing any other symptoms? Fatigue

## 2019-12-08 ENCOUNTER — Other Ambulatory Visit: Payer: Self-pay | Admitting: General Practice

## 2019-12-08 DIAGNOSIS — N289 Disorder of kidney and ureter, unspecified: Secondary | ICD-10-CM

## 2019-12-11 ENCOUNTER — Ambulatory Visit: Payer: Medicare HMO | Admitting: Cardiology

## 2019-12-11 ENCOUNTER — Encounter: Payer: Self-pay | Admitting: Cardiology

## 2019-12-11 VITALS — BP 138/70 | HR 63 | Ht 67.0 in | Wt 171.0 lb

## 2019-12-11 DIAGNOSIS — I739 Peripheral vascular disease, unspecified: Secondary | ICD-10-CM

## 2019-12-11 DIAGNOSIS — I079 Rheumatic tricuspid valve disease, unspecified: Secondary | ICD-10-CM | POA: Diagnosis not present

## 2019-12-11 DIAGNOSIS — I480 Paroxysmal atrial fibrillation: Secondary | ICD-10-CM | POA: Diagnosis not present

## 2019-12-11 DIAGNOSIS — R0602 Shortness of breath: Secondary | ICD-10-CM | POA: Diagnosis not present

## 2019-12-11 DIAGNOSIS — Q211 Atrial septal defect, unspecified: Secondary | ICD-10-CM

## 2019-12-11 NOTE — Patient Instructions (Signed)
Medication Instructions:   TAKE 60 MG OF FUROSEMIDE FOR THE NEXT 2 DAYS AND THEN DECREASE TO 40 MG ONCE DAILY  *If you need a refill on your cardiac medications before your next appointment, please call your pharmacy*   Lab Work:  Your physician recommends that you return for lab work in: [place]  If you have labs (blood work) drawn today and your tests are completely normal, you will receive your results only by: Marland Kitchen MyChart Message (if you have MyChart) OR . A paper copy in the mail If you have any lab test that is abnormal or we need to change your treatment, we will call you to review the results.   Testing/Procedures:  Your physician has requested that you have an echocardiogram. Echocardiography is a painless test that uses sound waves to create images of your heart. It provides your doctor with information about the size and shape of your heart and how well your heart's chambers and valves are working. This procedure takes approximately one hour. There are no restrictions for this procedure.[place]     Follow-Up: At [facility], you and your health needs are our priority.  As part of our continuing mission to provide you with exceptional heart care, we have created designated Provider Care Teams.  These Care Teams include your primary Cardiologist (physician) and Advanced Practice Providers (APPs -  Physician Assistants and Nurse Practitioners) who all work together to provide you with the care you need, when you need it.  We recommend signing up for the patient portal called "MyChart".  Sign up information is provided on this After Visit Summary.  MyChart is used to connect with patients for Virtual Visits (Telemedicine).  Patients are able to view lab/test results, encounter notes, upcoming appointments, etc.  Non-urgent messages can be sent to your provider as well.   To learn more about what you can do with MyChart, go to [email or web address].    Your  next appointment:   4 week(s)  The format for your next appointment:   In Person  Provider:   Kirk Ruths, MD

## 2019-12-11 NOTE — Progress Notes (Signed)
HPI: FU syncope, ASDand atrial fibrillation. Patient with history of syncope felt secondary to autonomic dysfunction. Previously loop monitor considered; Dr Lovena Le felt it would be indicated if recurrent syncope. H/O pulmonary embolus; seen by pulmonary and chronic anticoagulation felt indicated.Holter monitor November 2018 showed atrial fibrillation with PVCs or aberrantly conducted beats rate controlled.Patient previously placed on amiodarone and underwent successful cardioversion to sinus rhythm February 2019. Echocardiogram August 2020 showed normal LV function, severe RV dysfunction, severe right ventricular enlargement, moderate left atrial enlargement, severe right atrial enlargement, severe tricuspid regurgitation, mild aortic insufficiency. Follow-up study September 2020 showed moderate to large secundum atrial septal defect with bidirectional shunting.  ABIs May 2021 normal on the right and severe on the left. Peripheral vascular disease followed by Dr. Scot Dock. Recently contacted the office with complaints of increased dyspnea and had an office visit scheduled.  Since last seen,she notes worsening dyspnea on exertion.  She denies orthopnea or PND.  She does have chronic mild pedal edema.  No chest pain or syncope.  Current Outpatient Medications  Medication Sig Dispense Refill  . acetaminophen (TYLENOL) 650 MG CR tablet Take 1,300 mg by mouth every 8 (eight) hours as needed for pain.    Marland Kitchen amiodarone (PACERONE) 200 MG tablet TAKE ONE TABLET TWICE DAILY FOR ONE WEEK THEN DECREASE TO 1 TABLET DAILY 135 tablet 3  . amLODipine (NORVASC) 10 MG tablet TAKE 1 TABLET BY MOUTH EVERY DAY 90 tablet 3  . apixaban (ELIQUIS) 2.5 MG TABS tablet Take 1 tablet (2.5 mg total) by mouth 2 (two) times daily. 60 tablet   . atorvastatin (LIPITOR) 20 MG tablet TAKE 1 TABLET (20 MG TOTAL) BY MOUTH DAILY AT 6 PM. 90 tablet 3  . furosemide (LASIX) 20 MG tablet TAKE 1 TABLET BY MOUTH EVERY DAY 90 tablet 3  .  furosemide (LASIX) 40 MG tablet TAKE 1 TABLET BY MOUTH EVERY DAY 90 tablet 3  . levothyroxine (SYNTHROID) 25 MCG tablet Take 1 tablet (25 mcg total) by mouth daily. 90 tablet 3  . methocarbamol (ROBAXIN) 500 MG tablet Take 1 tablet (500 mg total) by mouth every 6 (six) hours as needed for muscle spasms. 60 tablet 1  . metoprolol succinate (TOPROL-XL) 25 MG 24 hr tablet TAKE 1 TABLET BY MOUTH EVERY DAY 90 tablet 3   No current facility-administered medications for this visit.     Past Medical History:  Diagnosis Date  . Arthritis   . BREAST BIOPSY, HX OF 06/10/2006  . Breast cyst   . Dysrhythmia   . HIP PAIN, LEFT 01/31/2007  . HYPERLIPIDEMIA 06/10/2006  . HYPERTENSION 06/10/2006  . Impaired glucose tolerance   . Lower extremity edema   . NEOPLASMS UNSPEC NATURE BONE SOFT TISSUE&SKIN 05/31/2008  . OSTEOPOROSIS 06/10/2006  . Pulmonary embolus [facility])     Past Surgical History:  Procedure Laterality Date  . ABDOMINAL HYSTERECTOMY    . APPENDECTOMY    . BREAST SURGERY     bx x2  . CARDIOVERSION N/A 03/11/2017   Procedure: CARDIOVERSION;  Surgeon: Sanda Klein, MD;  Location: [place] [facility];  Service: Cardiovascular;  Laterality: N/A;  . CHOLECYSTECTOMY  1980  . TRANSFORAMINAL LUMBAR INTERBODY FUSION (TLIF) WITH PEDICLE SCREW FIXATION 1 LEVEL Left 10/27/2018   Procedure: Left Lumbar five-sacral one Transforaminal lumbar interbody fusion;  Surgeon: Erline Levine, MD;  Location: [place];  Service: Neurosurgery;  Laterality: Left;    Social History   Socioeconomic History  . Marital status: Married  Spouse name: Not on file  . Number of children: 4  . Years of education: Not on file  . Highest education level: Not on file  Occupational History  . Not on file  Tobacco Use  . Smoking status: Never Smoker  . Smokeless tobacco: Current User    Types: Snuff  Vaping Use  . Vaping Use: Never used  Substance and Sexual Activity  . Alcohol use: No    Alcohol/week: 0.0 standard drinks  .  Drug use: No  . Sexual activity: Not Currently  Other Topics Concern  . Not on file  Social History Narrative   She is retired    Worked in [employer] for 20 years    Married for 43 years    4 children - live locally.        Social Determinants of Health   Financial Resource Strain: Low Risk   . Difficulty of Paying Living Expenses: Not hard at all  Food Insecurity: No Food Insecurity  . Worried About Charity fundraiser in the Last Year: Never true  . Ran Out of Food in the Last Year: Never true  Transportation Needs: No Transportation Needs  . Lack of Transportation (Medical): No  . Lack of Transportation (Non-Medical): No  Physical Activity: Inactive  . Days of Exercise per Week: 0 days  . Minutes of Exercise per Session: 0 min  Stress: No Stress Concern Present  . Feeling of Stress : Not at all  Social Connections: Moderately Integrated  . Frequency of Communication with Friends and Family: More than three times a week  . Frequency of Social Gatherings with Friends and Family: Once a week  . Attends Religious Services: More than 4 times per year  . Active Member of Clubs or Organizations: No  . Attends Archivist Meetings: Never  . Marital Status: Married  Human resources officer Violence: Not At Risk  . Fear of Current or Ex-Partner: No  . Emotionally Abused: No  . Physically Abused: No  . Sexually Abused: No    Family History  Problem Relation Age of Onset  . Heart disease Mother        Pacemaker  . Heart disease Father   . Asthma Father   . Diabetes Father   . Cancer Maternal Aunt        breast  . Heart disease Sister   . Hyperlipidemia Sister   . Hypertension Sister   . Lung cancer Sister     ROS: no fevers or chills, productive cough, hemoptysis, dysphasia, odynophagia, melena, hematochezia, dysuria, hematuria, rash, seizure activity, orthopnea, PND, pedal edema, claudication. Remaining systems are negative.  Physical Exam: Well-developed  well-nourished in no acute distress.  Skin is warm and dry.  HEENT is normal.  Neck is supple.  Chest is clear to auscultation with normal expansion.  Cardiovascular exam is regular rate and rhythm.  Abdominal exam nontender or distended. No masses palpated. Extremities show trace to 1+ edema. neuro grossly intact  ECG-sinus rhythm with first-degree AV block, right bundle branch block.  Cannot rule out septal infarct.  Personally reviewed  A/P  1 dyspnea-patient complains of worsening dyspnea on exertion.  We are trying to balance keeping patient euvolemic versus worsening renal insufficiency.  Minimally volume overloaded on examination.  I will give her Lasix 60 mg daily for the next 2 days then 40 mg daily thereafter.  We discussed fluid restriction and low-sodium diet.  Check potassium and renal function in 1 week.  I will also check a hemoglobin at that time to make sure that anemia is not contributing to her dyspnea.  2 paroxysmal atrial fibrillation-she remains in sinus rhythm.  Beta-blocker discontinued previously due to bradycardia.  Continue apixaban.  3 hypertension-blood pressure controlled.  Continue present medications.  4 prior pulmonary embolus-continue apixaban.  5 chronic right-sided heart failure-as above we are increasing diuretics given persistent dyspnea.  6 atrial septal defect-patient wants only conservative measures given age.  7 hyperlipidemia-continue statin.  8 tricuspid regurgitation-conservative measures given patient's age and overall medical condition.  9 history of syncope-no recurrences.  10 peripheral vascular disease-continue statin.  She is not on aspirin given the apixaban.  11 chronic stage III kidney disease  Kirk Ruths, MD

## 2019-12-18 DIAGNOSIS — R0602 Shortness of breath: Secondary | ICD-10-CM | POA: Diagnosis not present

## 2019-12-18 LAB — BASIC METABOLIC PANEL
BUN/Creatinine Ratio: 28 (ref 12–28)
BUN: 40 mg/dL — ABNORMAL HIGH (ref 8–27)
CO2: 22 mmol/L (ref 20–29)
Calcium: 9.5 mg/dL (ref 8.7–10.3)
Chloride: 104 mmol/L (ref 96–106)
Creatinine, Ser: 1.43 mg/dL — ABNORMAL HIGH (ref 0.57–1.00)
GFR calc Af Amer: 38 mL/min/{1.73_m2} — ABNORMAL LOW (ref >59)
GFR calc non Af Amer: 33 mL/min/{1.73_m2} — ABNORMAL LOW (ref >59)
Glucose: 109 mg/dL — ABNORMAL HIGH (ref 65–99)
Potassium: 4.6 mmol/L (ref 3.5–5.2)
Sodium: 143 mmol/L (ref 134–144)

## 2019-12-18 LAB — CBC
Hematocrit: 39.3 % (ref 34.0–46.6)
Hemoglobin: 13.3 g/dL (ref 11.1–15.9)
MCH: 31.7 pg (ref 26.6–33.0)
MCHC: 33.8 g/dL (ref 31.5–35.7)
MCV: 94 fL (ref 79–97)
Platelets: 250 10*3/uL (ref 150–450)
RBC: 4.19 x10E6/uL (ref 3.77–5.28)
RDW: 13.7 % (ref 11.7–15.4)
WBC: 7.1 10*3/uL (ref 3.4–10.8)

## 2019-12-25 ENCOUNTER — Encounter: Payer: Self-pay | Admitting: *Deleted

## 2019-12-27 NOTE — Progress Notes (Signed)
HPI: FU syncope, ASDand atrial fibrillation. Patient with history of syncope felt secondary to autonomic dysfunction. Previously loop monitor considered; Dr Lovena Le felt it would be indicated if recurrent syncope. H/O pulmonary embolus; seen by pulmonary and chronic anticoagulation felt indicated.Holter monitor November 2018 showed atrial fibrillation with PVCs or aberrantly conducted beats rate controlled. Patient previously placed on amiodarone and underwent successful cardioversion to sinus rhythm February 2019. Echocardiogram August 2020 showed normal LV function, severe RV dysfunction, severe right ventricular enlargement, moderate left atrial enlargement, severe right atrial enlargement, severe tricuspid regurgitation, mild aortic insufficiency. Follow-up study September 2020 showed moderate to large secundum atrial septal defect with bidirectional shunting.ABIs May 2021 normal on the right and severe on the left. Peripheral vascular disease followed by Dr. Scot Dock. Echocardiogram repeated December 2021 and showed normal LV function, moderate left ventricular hypertrophy, grade 2 diastolic dysfunction, severe pulmonary hypertension, moderate right atrial enlargement, mild mitral regurgitation, moderate to severe tricuspid regurgitation, mild aortic insufficiency.  Lasix increased at last office visit due to worsening dyspnea/pedal edema.  Since last seen,she had improved since increasing her Lasix until today when she had more dyspnea.  She denies exertional chest pain.  No syncope.  Her pedal edema has improved since last office visit.  Current Outpatient Medications  Medication Sig Dispense Refill  . acetaminophen (TYLENOL) 650 MG CR tablet Take 1,300 mg by mouth every 8 (eight) hours as needed for pain.    Marland Kitchen amiodarone (PACERONE) 200 MG tablet TAKE ONE TABLET TWICE DAILY FOR ONE WEEK THEN DECREASE TO 1 TABLET DAILY 135 tablet 3  . amLODipine (NORVASC) 10 MG tablet TAKE 1 TABLET BY MOUTH  EVERY DAY 90 tablet 3  . apixaban (ELIQUIS) 2.5 MG TABS tablet Take 1 tablet (2.5 mg total) by mouth 2 (two) times daily. 60 tablet   . atorvastatin (LIPITOR) 20 MG tablet TAKE 1 TABLET (20 MG TOTAL) BY MOUTH DAILY AT 6 PM. 90 tablet 3  . furosemide (LASIX) 40 MG tablet TAKE 1 TABLET BY MOUTH EVERY DAY 90 tablet 3  . levothyroxine (SYNTHROID) 25 MCG tablet Take 1 tablet (25 mcg total) by mouth daily. 90 tablet 3   No current facility-administered medications for this visit.     Past Medical History:  Diagnosis Date  . Arthritis   . BREAST BIOPSY, HX OF 06/10/2006  . Breast cyst   . Dysrhythmia   . HIP PAIN, LEFT 01/31/2007  . HYPERLIPIDEMIA 06/10/2006  . HYPERTENSION 06/10/2006  . Impaired glucose tolerance   . Lower extremity edema   . NEOPLASMS UNSPEC NATURE BONE SOFT TISSUE&SKIN 05/31/2008  . OSTEOPOROSIS 06/10/2006  . Pulmonary embolus [facility])     Past Surgical History:  Procedure Laterality Date  . ABDOMINAL HYSTERECTOMY    . APPENDECTOMY    . BREAST SURGERY     bx x2  . CARDIOVERSION N/A 03/11/2017   Procedure: CARDIOVERSION;  Surgeon: Sanda Klein, MD;  Location: [place] [facility];  Service: Cardiovascular;  Laterality: N/A;  . CHOLECYSTECTOMY  1980  . TRANSFORAMINAL LUMBAR INTERBODY FUSION (TLIF) WITH PEDICLE SCREW FIXATION 1 LEVEL Left 10/27/2018   Procedure: Left Lumbar five-sacral one Transforaminal lumbar interbody fusion;  Surgeon: Erline Levine, MD;  Location: [place];  Service: Neurosurgery;  Laterality: Left;    Social History   Socioeconomic History  . Marital status: Married    Spouse name: Not on file  . Number of children: 4  . Years of education: Not on file  . Highest education level: Not on  file  Occupational History  . Not on file  Tobacco Use  . Smoking status: Never Smoker  . Smokeless tobacco: Current User    Types: Snuff  Vaping Use  . Vaping Use: Never used  Substance and Sexual Activity  . Alcohol use: No    Alcohol/week: 0.0 standard drinks  .  Drug use: No  . Sexual activity: Not Currently  Other Topics Concern  . Not on file  Social History Narrative   She is retired    Worked in [employer] for 20 years    Married for 43 years    4 children - live locally.        Social Determinants of Health   Financial Resource Strain: Low Risk   . Difficulty of Paying Living Expenses: Not hard at all  Food Insecurity: No Food Insecurity  . Worried About Charity fundraiser in the Last Year: Never true  . Ran Out of Food in the Last Year: Never true  Transportation Needs: No Transportation Needs  . Lack of Transportation (Medical): No  . Lack of Transportation (Non-Medical): No  Physical Activity: Inactive  . Days of Exercise per Week: 0 days  . Minutes of Exercise per Session: 0 min  Stress: No Stress Concern Present  . Feeling of Stress : Not at all  Social Connections: Moderately Integrated  . Frequency of Communication with Friends and Family: More than three times a week  . Frequency of Social Gatherings with Friends and Family: Once a week  . Attends Religious Services: More than 4 times per year  . Active Member of Clubs or Organizations: No  . Attends Archivist Meetings: Never  . Marital Status: Married  Human resources officer Violence: Not At Risk  . Fear of Current or Ex-Partner: No  . Emotionally Abused: No  . Physically Abused: No  . Sexually Abused: No    Family History  Problem Relation Age of Onset  . Heart disease Mother        Pacemaker  . Heart disease Father   . Asthma Father   . Diabetes Father   . Cancer Maternal Aunt        breast  . Heart disease Sister   . Hyperlipidemia Sister   . Hypertension Sister   . Lung cancer Sister     ROS: no fevers or chills, productive cough, hemoptysis, dysphasia, odynophagia, melena, hematochezia, dysuria, hematuria, rash, seizure activity, orthopnea, PND, pedal edema, claudication. Remaining systems are negative.  Physical Exam: Well-developed  well-nourished in no acute distress.  Skin is warm and dry.  HEENT is normal.  Neck is supple.  Chest is clear to auscultation with normal expansion.  Cardiovascular exam is regular rate and rhythm.  Abdominal exam nontender or distended. No masses palpated. Extremities show 1+ ankle edema. neuro grossly intact   A/P  1 chronic right-sided heart failure-patient describes increased dyspnea today but had been improving on the higher dose of Lasix ordered at last office visit.  I will give an additional 40 mg of Lasix today.  She will then continue 40 mg daily with an additional 40 mg for weight gain of 2 to 3 pounds.  Check potassium and renal function.  She needs to continue fluid restriction and low-sodium diet.  2 paroxysmal atrial fibrillation-patient is in sinus rhythm on examination today.  Beta-blocker discontinued due to bradycardia.  Continue apixaban.  3 hypertension-patient's blood pressure is controlled.  Continue present medications.  4 prior pulmonary embolus-continue  apixaban.  5 ASD-patient has requested only conservative measures given age which I think is reasonable.  6 hyperlipidemia-continue statin.  7 tricuspid regurgitation-we will plan conservative measures given patient's age.  8 history of syncope-patient has had no recurrences.  9 peripheral vascular disease-continue statin.  Patient is not on aspirin given need for anticoagulation.  10 chronic stage III kidney disease-plan follow-up bmet.  Kirk Ruths, MD

## 2020-01-02 ENCOUNTER — Other Ambulatory Visit: Payer: Self-pay

## 2020-01-02 ENCOUNTER — Encounter: Payer: Self-pay | Admitting: Adult Health

## 2020-01-02 ENCOUNTER — Ambulatory Visit (INDEPENDENT_AMBULATORY_CARE_PROVIDER_SITE_OTHER): Payer: Medicare HMO

## 2020-01-02 ENCOUNTER — Ambulatory Visit (INDEPENDENT_AMBULATORY_CARE_PROVIDER_SITE_OTHER): Payer: Medicare HMO | Admitting: Adult Health

## 2020-01-02 VITALS — BP 148/60 | HR 70 | Temp 97.9°F | Ht 67.0 in | Wt 171.6 lb

## 2020-01-02 DIAGNOSIS — R739 Hyperglycemia, unspecified: Secondary | ICD-10-CM | POA: Diagnosis not present

## 2020-01-02 DIAGNOSIS — G8929 Other chronic pain: Secondary | ICD-10-CM

## 2020-01-02 DIAGNOSIS — M47814 Spondylosis without myelopathy or radiculopathy, thoracic region: Secondary | ICD-10-CM | POA: Diagnosis not present

## 2020-01-02 DIAGNOSIS — M546 Pain in thoracic spine: Secondary | ICD-10-CM

## 2020-01-02 LAB — POCT GLYCOSYLATED HEMOGLOBIN (HGB A1C): Hemoglobin A1C: 5.9 % — AB (ref 4.0–5.6)

## 2020-01-02 NOTE — Patient Instructions (Signed)
Your A1c was 5.9 - this has improved!   I will follow up with you after your xrays are back    Follow up for your physical after June 3rd

## 2020-01-02 NOTE — Progress Notes (Signed)
Subjective:    Patient ID: Tammy [name], female    DOB: [birth year], 86 y.o.   MRN: [id number]  HPI 86 year old female who  has a past medical history of Arthritis, BREAST BIOPSY, HX OF (06/10/2006), Breast cyst, Dysrhythmia, HIP PAIN, LEFT (01/31/2007), HYPERLIPIDEMIA (06/10/2006), HYPERTENSION (06/10/2006), Impaired glucose tolerance, Lower extremity edema, NEOPLASMS UNSPEC NATURE BONE SOFT TISSUE&SKIN (05/31/2008), OSTEOPOROSIS (06/10/2006), and Pulmonary embolus ([place]).  She presents to the office today for 6 month follow up regarding glucose intolerance. During her CPE in June 2021 her A1c was 6.5. She is not on any glucose lowering medication.  She has not really made any lifestyle modifications.  She does have chronic back pain throughout her back and had lumbar interbody fusion done in October 2020 which resolved her hip and lower extremity discomfort.  Unfortunately she continues to have lumbar spine and thoracic spine pain.  She did have a lumbar spine x-ray done October 2020 which was relatively normal status post surgery.  She would like to have her thoracic spine done to make sure that there is no issues there.  She does take Tylenol 500 mg as needed, pain is worse with bending over at the waist and walking long distances.  No pain with laying down or standing.   Review of Systems See HPI   Past Medical History:  Diagnosis Date  . Arthritis   . BREAST BIOPSY, HX OF 06/10/2006  . Breast cyst   . Dysrhythmia   . HIP PAIN, LEFT 01/31/2007  . HYPERLIPIDEMIA 06/10/2006  . HYPERTENSION 06/10/2006  . Impaired glucose tolerance   . Lower extremity edema   . NEOPLASMS UNSPEC NATURE BONE SOFT TISSUE&SKIN 05/31/2008  . OSTEOPOROSIS 06/10/2006  . Pulmonary embolus [facility])     Social History   Socioeconomic History  . Marital status: Married    Spouse name: Not on file  . Number of children: 4  . Years of education: Not on file  . Highest education level: Not on file  Occupational History  . Not  on file  Tobacco Use  . Smoking status: Never Smoker  . Smokeless tobacco: Current User    Types: Snuff  Vaping Use  . Vaping Use: Never used  Substance and Sexual Activity  . Alcohol use: No    Alcohol/week: 0.0 standard drinks  . Drug use: No  . Sexual activity: Not Currently  Other Topics Concern  . Not on file  Social History Narrative   She is retired    Worked in [employer] for 20 years    Married for 43 years    4 children - live locally.        Social Determinants of Health   Financial Resource Strain: Low Risk   . Difficulty of Paying Living Expenses: Not hard at all  Food Insecurity: No Food Insecurity  . Worried About Charity fundraiser in the Last Year: Never true  . Ran Out of Food in the Last Year: Never true  Transportation Needs: No Transportation Needs  . Lack of Transportation (Medical): No  . Lack of Transportation (Non-Medical): No  Physical Activity: Inactive  . Days of Exercise per Week: 0 days  . Minutes of Exercise per Session: 0 min  Stress: No Stress Concern Present  . Feeling of Stress : Not at all  Social Connections: Moderately Integrated  . Frequency of Communication with Friends and Family: More than three times a week  . Frequency of Social Gatherings with Friends  and Family: Once a week  . Attends Religious Services: More than 4 times per year  . Active Member of Clubs or Organizations: No  . Attends Archivist Meetings: Never  . Marital Status: Married  Human resources officer Violence: Not At Risk  . Fear of Current or Ex-Partner: No  . Emotionally Abused: No  . Physically Abused: No  . Sexually Abused: No    Past Surgical History:  Procedure Laterality Date  . ABDOMINAL HYSTERECTOMY    . APPENDECTOMY    . BREAST SURGERY     bx x2  . CARDIOVERSION N/A 03/11/2017   Procedure: CARDIOVERSION;  Surgeon: Sanda Klein, MD;  Location: [place] [facility];  Service: Cardiovascular;  Laterality: N/A;  . CHOLECYSTECTOMY  1980  .  TRANSFORAMINAL LUMBAR INTERBODY FUSION (TLIF) WITH PEDICLE SCREW FIXATION 1 LEVEL Left 10/27/2018   Procedure: Left Lumbar five-sacral one Transforaminal lumbar interbody fusion;  Surgeon: Erline Levine, MD;  Location: [place];  Service: Neurosurgery;  Laterality: Left;    Family History  Problem Relation [name] of Onset  . Heart disease Mother        Pacemaker  . Heart disease Father   . Asthma Father   . Diabetes Father   . Cancer Maternal Aunt        breast  . Heart disease Sister   . Hyperlipidemia Sister   . Hypertension Sister   . Lung cancer Sister     Allergies  Allergen Reactions  . Tuberculin Tests Other (See Comments)    Tuberculosis shot, red spot on arm    Current Outpatient Medications on File Prior to Visit  Medication Sig Dispense Refill  . acetaminophen (TYLENOL) 650 MG CR tablet Take 1,300 mg by mouth every 8 (eight) hours as needed for pain.    Marland Kitchen amiodarone (PACERONE) 200 MG tablet TAKE ONE TABLET TWICE DAILY FOR ONE WEEK THEN DECREASE TO 1 TABLET DAILY 135 tablet 3  . amLODipine (NORVASC) 10 MG tablet TAKE 1 TABLET BY MOUTH EVERY DAY 90 tablet 3  . apixaban (ELIQUIS) 2.5 MG TABS tablet Take 1 tablet (2.5 mg total) by mouth 2 (two) times daily. 60 tablet   . atorvastatin (LIPITOR) 20 MG tablet TAKE 1 TABLET (20 MG TOTAL) BY MOUTH DAILY AT 6 PM. 90 tablet 3  . furosemide (LASIX) 40 MG tablet TAKE 1 TABLET BY MOUTH EVERY DAY 90 tablet 3  . levothyroxine (SYNTHROID) 25 MCG tablet Take 1 tablet (25 mcg total) by mouth daily. 90 tablet 3   No current facility-administered medications on file prior to visit.    BP (!) 148/60 (BP Location: Left Arm, Patient Position: Sitting, Cuff Size: Large)   Pulse 70   Temp 97.9 F (36.6 C) (Oral)   Ht 5\' 7"  (1.702 m)   Wt 171 lb 9.6 oz (77.8 kg)   LMP  (LMP Unknown)   BMI 26.88 kg/m       Objective:   Physical Exam Vitals and nursing note reviewed.  Constitutional:      Appearance: Normal appearance.  Musculoskeletal:         General: No swelling, tenderness or deformity.  Skin:    General: Skin is warm and dry.     Capillary Refill: Capillary refill takes less than 2 seconds.  Neurological:     General: No focal deficit present.     Mental Status: She is alert and oriented to person, place, and time.  Psychiatric:        Mood and Affect:  Mood normal.        Behavior: Behavior normal.        Thought Content: Thought content normal.        Judgment: Judgment normal.       Assessment & Plan:  1. Hyperglycemia  - POC HgB A1c- 5.9 - at goal. Encouraged lifestyle modifications   2. Chronic midline thoracic back pain  - DG Thoracic Spine W/Swimmers; Future - Can take tylenol 500 mg daily  - Does not want PT at this time   [name], NP

## 2020-01-05 ENCOUNTER — Other Ambulatory Visit: Payer: Self-pay

## 2020-01-05 ENCOUNTER — Ambulatory Visit (HOSPITAL_COMMUNITY): Payer: Medicare HMO | Attending: Cardiology

## 2020-01-05 DIAGNOSIS — R0602 Shortness of breath: Secondary | ICD-10-CM | POA: Insufficient documentation

## 2020-01-05 LAB — ECHOCARDIOGRAM COMPLETE
Area-P 1/2: 1.79 cm2
P 1/2 time: 516 msec
S' Lateral: 2.5 cm

## 2020-01-08 ENCOUNTER — Ambulatory Visit (INDEPENDENT_AMBULATORY_CARE_PROVIDER_SITE_OTHER): Payer: Medicare HMO | Admitting: Cardiology

## 2020-01-08 ENCOUNTER — Other Ambulatory Visit: Payer: Self-pay

## 2020-01-08 ENCOUNTER — Encounter: Payer: Self-pay | Admitting: Cardiology

## 2020-01-08 VITALS — BP 126/52 | HR 84 | Ht 67.0 in | Wt 170.0 lb

## 2020-01-08 DIAGNOSIS — Q211 Atrial septal defect, unspecified: Secondary | ICD-10-CM

## 2020-01-08 DIAGNOSIS — I480 Paroxysmal atrial fibrillation: Secondary | ICD-10-CM

## 2020-01-08 DIAGNOSIS — I10 Essential (primary) hypertension: Secondary | ICD-10-CM

## 2020-01-08 NOTE — Patient Instructions (Addendum)
Medication Instructions:   TAKE EXTRA 40 MG OF FUROSEMIDE TODAY  *If you need a refill on your cardiac medications before your next appointment, please call your pharmacy*   Lab Work:  Your physician recommends that you have lab work today   If you have labs (blood work) drawn today and your tests are completely normal, you will receive your results only by: Marland Kitchen MyChart Message (if you have MyChart) OR . A paper copy in the mail If you have any lab test that is abnormal or we need to change your treatment, we will call you to review the results.   Follow-Up: At [facility], you and your health needs are our priority.  As part of our continuing mission to provide you with exceptional heart care, we have created designated Provider Care Teams.  These Care Teams include your primary Cardiologist (physician) and Advanced Practice Providers (APPs -  Physician Assistants and Nurse Practitioners) who all work together to provide you with the care you need, when you need it.  We recommend signing up for the patient portal called "MyChart".  Sign up information is provided on this After Visit Summary.  MyChart is used to connect with patients for Virtual Visits (Telemedicine).  Patients are able to view lab/test results, encounter notes, upcoming appointments, etc.  Non-urgent messages can be sent to your provider as well.   To learn more about what you can do with MyChart, go to [email or web address].    Your next appointment:   3 month(s)  The format for your next appointment:   In Person  Provider:   Kirk Ruths, MD

## 2020-01-09 ENCOUNTER — Telehealth: Payer: Self-pay | Admitting: Pharmacist

## 2020-01-09 ENCOUNTER — Encounter: Payer: Self-pay | Admitting: *Deleted

## 2020-01-09 LAB — BASIC METABOLIC PANEL
BUN/Creatinine Ratio: 20 (ref 12–28)
BUN: 32 mg/dL — ABNORMAL HIGH (ref 8–27)
CO2: 21 mmol/L (ref 20–29)
Calcium: 9.8 mg/dL (ref 8.7–10.3)
Chloride: 105 mmol/L (ref 96–106)
Creatinine, Ser: 1.6 mg/dL — ABNORMAL HIGH (ref 0.57–1.00)
GFR calc Af Amer: 33 mL/min/{1.73_m2} — ABNORMAL LOW (ref >59)
GFR calc non Af Amer: 29 mL/min/{1.73_m2} — ABNORMAL LOW (ref >59)
Glucose: 138 mg/dL — ABNORMAL HIGH (ref 65–99)
Potassium: 5 mmol/L (ref 3.5–5.2)
Sodium: 143 mmol/L (ref 134–144)

## 2020-01-10 NOTE — Chronic Care Management (AMB) (Signed)
Chronic Care Management Pharmacy Assistant   Name: Tammy [name]  MRN: [id number] DOB: May 08, 1931  Reason for Encounter: Disease State/ Hypertension  Adherence Call  Patient Questions:  1.  Have you seen any other providers since your last visit? Yes . 01-08-20 and 12-11-19 Consult Office Lelon Perla, MD Cardiology . 01-02-20 Office visit [name], NP Family Medicine  2.  Any changes in your medicines or health? Yes Discontinued  . Metoprolol succinate (TOPROL-XL) 25 MG 24 hr tablet . Methocarbamol (ROBAXIN) 500 MG tablet Increase Furosemide 20 mg to 40 mg daily  PCP : [name], NP   Allergies:   Allergies  Allergen Reactions  . Tuberculin Tests Other (See Comments)    Tuberculosis shot, red spot on arm    Medications: Outpatient Encounter Medications as of 01/09/2020  Medication Sig  . acetaminophen (TYLENOL) 650 MG CR tablet Take 1,300 mg by mouth every 8 (eight) hours as needed for pain.  Marland Kitchen amiodarone (PACERONE) 200 MG tablet TAKE ONE TABLET TWICE DAILY FOR ONE WEEK THEN DECREASE TO 1 TABLET DAILY  . amLODipine (NORVASC) 10 MG tablet TAKE 1 TABLET BY MOUTH EVERY DAY  . apixaban (ELIQUIS) 2.5 MG TABS tablet Take 1 tablet (2.5 mg total) by mouth 2 (two) times daily.  Marland Kitchen atorvastatin (LIPITOR) 20 MG tablet TAKE 1 TABLET (20 MG TOTAL) BY MOUTH DAILY AT 6 PM.  . furosemide (LASIX) 40 MG tablet TAKE 1 TABLET BY MOUTH EVERY DAY  . levothyroxine (SYNTHROID) 25 MCG tablet Take 1 tablet (25 mcg total) by mouth daily.   No facility-administered encounter medications on file as of 01/09/2020.    Current Diagnosis: Patient Active Problem List   Diagnosis Date Noted  . Spondylolisthesis of lumbar region 10/27/2018  . Atrial fibrillation ([place]) 03/21/2014  . Embolism, pulmonary with infarction ([place]) 03/08/2014  . Severe tricuspid regurgitation by prior echocardiogram 02/23/2014  . Pulmonary hypertension ([place]) 02/23/2014  . Syncope and collapse 02/23/2014  . Pain  in limb 06/07/2013  . PAD (peripheral artery disease) ([place]) 06/01/2013  . Syncope 04/02/2012  . NEOPLASMS UNSPEC NATURE BONE SOFT TISSUE&SKIN 05/31/2008  . Dyslipidemia 06/10/2006  . Essential hypertension 06/10/2006  . Osteoporosis 06/10/2006  . BREAST BIOPSY, HX OF 06/10/2006    Goals Addressed   None    Reviewed chart prior to disease state call. Spoke with patient regarding BP  Recent Office Vitals: BP Readings from Last 3 Encounters:  01/08/20 (!) 126/52  01/02/20 (!) 148/60  12/11/19 138/70   Pulse Readings from Last 3 Encounters:  01/08/20 84  01/02/20 70  12/11/19 63    Wt Readings from Last 3 Encounters:  01/08/20 170 lb (77.1 kg)  01/02/20 171 lb 9.6 oz (77.8 kg)  12/11/19 171 lb (77.6 kg)     Kidney Function Lab Results  Component Value Date/Time   CREATININE 1.60 (H) 01/08/2020 12:24 PM   CREATININE 1.43 (H) 12/18/2019 09:12 AM   CREATININE 1.10 (H) 11/05/2014 11:51 AM   CREATININE 1.08 04/13/2014 09:02 AM   GFR 25.69 (L) 06/29/2019 08:21 AM   GFRNONAA 29 (L) 01/08/2020 12:24 PM   GFRNONAA 56 (L) 03/30/2014 09:31 AM   GFRAA 33 (L) 01/08/2020 12:24 PM   GFRAA 65 03/30/2014 09:31 AM    BMP Latest Ref Rng & Units 01/08/2020 12/18/2019 10/30/2019  Glucose 65 - 99 mg/dL 138(H) 109(H) 118(H)  BUN 8 - 27 mg/dL 32(H) 40(H) 33(H)  Creatinine 0.57 - 1.00 mg/dL 1.60(H) 1.43(H) 1.95(H)  BUN/Creat Ratio 12 -  28 20 28 17   Sodium 134 - 144 mmol/L 143 143 142  Potassium 3.5 - 5.2 mmol/L 5.0 4.6 5.1  Chloride 96 - 106 mmol/L 105 104 106  CO2 20 - 29 mmol/L 21 22 23   Calcium 8.7 - 10.3 mg/dL 9.8 9.5 9.5    . Current antihypertensive regimen:  o Amlodipine 10 mg 1 tablet daily . How often are you checking your Blood Pressure? 1-2x per week . Current home BP readings: 131/60 done today . What recent interventions/DTPs have been made by any provider to improve Blood Pressure control since last CPP Visit: None . Any recent hospitalizations or ED visits since last  visit with CPP? No . What diet changes have been made to improve Blood Pressure Control?  o She eating smaller portions . What exercise is being done to improve your Blood Pressure Control?  o None  Adherence Review: Is the patient currently on ACE/ARB medication? No Does the patient have >5 day gap between last estimated fill dates? No   Follow-Up:  Pharmacist Review   Maia Breslow, [place] Assistant [phone]

## 2020-01-15 ENCOUNTER — Other Ambulatory Visit: Payer: Self-pay

## 2020-01-15 MED ORDER — APIXABAN 2.5 MG PO TABS: 2.5000 mg | ORAL_TABLET | Freq: Two times a day (BID) | ORAL | 1 refills | Status: DC

## 2020-01-29 ENCOUNTER — Telehealth: Payer: Self-pay | Admitting: *Deleted

## 2020-01-29 NOTE — Telephone Encounter (Signed)
Spoke with pt husband, aware patient assistance application for eliquis faxed to the company.

## 2020-02-08 ENCOUNTER — Ambulatory Visit: Payer: Medicare HMO | Admitting: Pharmacist

## 2020-02-08 DIAGNOSIS — I10 Essential (primary) hypertension: Secondary | ICD-10-CM

## 2020-02-08 DIAGNOSIS — I480 Paroxysmal atrial fibrillation: Secondary | ICD-10-CM

## 2020-02-08 NOTE — Chronic Care Management (AMB) (Signed)
Chronic Care Management Pharmacy  Name: Tammy [name]  MRN: [id number] DOB: [birth year]  Initial Questions: 1. Have you seen any other providers since your last visit? n/a 2. Any changes in your medicines or health? No   Chief Complaint/ HPI  Tammy [name],  87 y.o. , female presents for their Follow-Up CCM visit with the clinical pharmacist via telephone.  PCP : [name], NP  Their chronic conditions include: HTN, HLD, preDM, osteoporosis, AFib, pulmonary hypertension, hypothyroidism  Office Visits: -01/02/20 [name], NP: Patient presented for back pain follow up. A1c decreased to 5.9%.  11/22/19 Ofilia Neas, LPN: Patient presented for medicare annual wellness visit.  06/29/19 [name], NP - Patient presented for yearly preventative examination. Patient has no acute issues. A1c was 6.5% which increased from 6.3%. BUN/Scr elevated. All other labs WNL. Follow up in 6 months.  Consult Visit: -12/11/19 Kirk Ruths, MD (Cardiology): Patient presented for Afib follow up. Increased furosemide to 40 mg every day.  10/30/19 Kirk Ruths, MD (Cardiology): Patient presented for Afib follow up.   06/05/19 Leontine Locket, PA-C (vascular surgery): Patient presented for follow up for left leg claudication and DVT in LLE with PE. Patient's leg swelling is much better than a year ago. ABIs have slightly decreased from last visit. Follow up in 1 year.  -04/24/19 Erline Levine (neurosurgery): Patient presented with low back pain. Unable to review notes.  -04/17/19 Kirk Ruths , MD (cardiology): Patient presented for Afib follow up. CMP, CBC, and TSH completed. Renal function had declined and sodium and glucose were elevated. Follow up in 6 months.  Medications: Outpatient Encounter Medications as of 02/08/2020  Medication Sig   acetaminophen (TYLENOL) 650 MG CR tablet Take 1,300 mg by mouth every 8 (eight) hours as needed for pain.   amiodarone (PACERONE) 200 MG tablet TAKE ONE  TABLET TWICE DAILY FOR ONE WEEK THEN DECREASE TO 1 TABLET DAILY   amLODipine (NORVASC) 10 MG tablet TAKE 1 TABLET BY MOUTH EVERY DAY   apixaban (ELIQUIS) 2.5 MG TABS tablet Take 1 tablet (2.5 mg total) by mouth 2 (two) times daily.   atorvastatin (LIPITOR) 20 MG tablet TAKE 1 TABLET (20 MG TOTAL) BY MOUTH DAILY AT 6 PM.   furosemide (LASIX) 40 MG tablet TAKE 1 TABLET BY MOUTH EVERY DAY   levothyroxine (SYNTHROID) 25 MCG tablet Take 1 tablet (25 mcg total) by mouth daily.   No facility-administered encounter medications on file as of 02/08/2020.   Patient reports her biggest concerns right now are the arthritis in her hip as well as the shortness of breath on exertion and fluid build up.  Current Diagnosis/Assessment:  Goals Addressed            This Visit's Progress    Pharmacy care plan       CARE PLAN ENTRY (see longitudinal plan of care for additional care plan information)  Current Barriers:   Chronic Disease Management support, education, and care coordination needs related to Hypertension, Hyperlipidemia, Atrial Fibrillation, Hypothyroidism, Osteoporosis, and prediabetes   Hypertension BP Readings from Last 3 Encounters:  01/08/20 (!) 126/52  01/02/20 (!) 148/60  12/11/19 138/70    Pharmacist Clinical Goal(s): o Over the next 120 days, patient will work with PharmD and providers to maintain BP goal <140/90  Current regimen:   Amlodipine 10 mg 1 tablet daily  Interventions: o We discussed the importance of checking your blood pressure at home in order to see how well your medicines are working  Patient self care activities - Over the next 120 days, patient will: o Check blood pressure at least weekly, document, and provide at future appointments o Ensure daily salt intake < 2300 mg/day  Hyperlipidemia Lab Results  Component Value Date/Time   LDLCALC 43 06/29/2019 08:21 AM   LDLCALC 48 11/25/2016 10:04 AM    Pharmacist Clinical Goal(s): o Over the next  120 days, patient will work with PharmD and providers to maintain LDL goal < 70  Current regimen:  o Atorvastatin 20 mg 1 tablet daily  Patient self care activities - Over the next 120 days, patient will: o Continue taking your medicines as prescribed  Pre-Diabetes Lab Results  Component Value Date/Time   HGBA1C 5.9 (A) 01/02/2020 09:30 AM   HGBA1C 6.5 06/29/2019 08:21 AM   HGBA1C 6.3 (H) 04/02/2012 04:20 PM    Pharmacist Clinical Goal(s): o Over the next 120 days, patient will work with PharmD and providers to maintain A1c goal <6.5%  Current regimen:  o No medications  Interventions: o Discussed following the healthy plate method which includes:  Fill half of your plate with nonstarchy vegetables, such as spinach, broccoli, carrots and tomatoes.  Fill a quarter of your plate with a protein, such as tuna, lean pork or chicken.  Fill the last quarter with a whole-grain item, such as brown rice, or a starchy vegetable, such as green peas or potatoes.  Include "good" fats such as nuts or avocados in small amounts.  Patient self care activities - Over the next 120 days, patient will: o Limiting sweets and making sure to eat more vegetables to lower blood sugars  Osteoporosis  Pharmacist Clinical Goal(s) o Over the next 120 days, patient will work with PharmD and providers to maintain bone health  Current regimen:  o No medications  Interventions: o We discussed getting the recommended [phone] units of vitamin D daily and 1200 mg of calcium daily from dietary and supplemental sources.  Patient self care activities - Over the next 120 days, patient will: o Incorporate more foods with calcium and vitamin D and supplement as needed to get the recommended amount  Pulmonary hypertension  Pharmacist Clinical Goal(s) o Over the next 120 days, patient will work with PharmD and providers to maintain bone health  Current regimen:   Furosemide 40 mg 1 tablet  daily  Interventions: o We discussed checking package labels for sodium and trying to limit sodium intake to < 1500 mg/day o We discussed elevating your feet if you notice swelling  Patient self care activities - Over the next 120 days, patient will: o Continue to monitor sodium intake and to aim for < 1500 mg/day o Reach out to cardiologist about the need to weigh herself daily  Medication management  Pharmacist Clinical Goal(s): o Over the next 120 days, patient will work with PharmD and providers to maintain optimal medication adherence  [facility]  Interventions o Comprehensive medication review performed. o Continue current medication management strategy  Patient self care activities - Over the next 120 days, patient will: o Take medications as prescribed o Report any questions or concerns to PharmD and/or provider(s)  Please see past updates related to this goal by clicking on the "Past Updates" button in the selected goal         Hypertension   BP goal is:  <140/90 (due to previous history with dizziness/syncope)  Office blood pressures are  BP Readings from Last 3 Encounters:  01/08/20 (!) 126/52  01/02/20 Marland Kitchen)  148/60  12/11/19 138/70   Patient checks BP at home 1-2x per week  BP at home: 146/60, 116/52, usually in the 130s  Patient has failed these meds in the past: diltiazem (unknown), HCTZ Patient is currently uncontrolled on the following medications:   Amlodipine 10 mg 1 tablet daily  We discussed diet and exercise extensively and the importance of monitoring blood pressure at home  -Patient reports that swelling is on both of her legs up to her knee and is taking 2 of the furosemide for that along with shortness of breath -Diet: patient has been looking at package labels for sodium and is trying to stick with the 1 L recommendation of fluid each day  Plan  Continue current medications. Plan to monitoring blood pressure at home at least  weekly.  Will discuss with PCP about reducing/switching blood pressure medications due to swelling.   Hyperlipidemia   LDL goal < 70  Lipid Panel     Component Value Date/Time   CHOL 122 06/29/2019 0821   CHOL 135 11/25/2016 1004   TRIG 71.0 06/29/2019 0821   HDL 64.40 06/29/2019 0821   HDL 65 11/25/2016 1004   LDLCALC 43 06/29/2019 0821   LDLCALC 48 11/25/2016 1004    Hepatic Function Latest Ref Rng & Units 10/30/2019 06/29/2019 04/17/2019  Total Protein 6.0 - 8.5 g/dL 7.3 7.4 7.3  Albumin 3.6 - 4.6 g/dL 4.1 4.4 4.5  AST 0 - 40 IU/L 36 34 36  ALT 0 - 32 IU/L 30 32 34(H)  Alk Phosphatase 44 - 121 IU/L 103 102 112  Total Bilirubin 0.0 - 1.2 mg/dL 0.6 0.8 0.8  Bilirubin, Direct 0.00 - 0.40 mg/dL 0.20 - -     The ASCVD Risk score Mikey Bussing DC Jr., et al., 2013) failed to calculate for the following reasons:   The 2013 ASCVD risk score is only valid for ages 53 to 58   Patient has failed these meds in past: simvastatin (unknown) Patient is currently controlled on the following medications:   Atorvastatin 20 mg 1 tablet daily  We discussed:  diet and exercise extensively  Plan  Continue current medications and control with diet and exercise  AFIB   Patient is currently rate controlled. Office heart rates are  Pulse Readings from Last 3 Encounters:  01/08/20 84  01/02/20 70  12/11/19 63    CHA2DS2-VASc Score =    7 The patient's score is based upon: [name], sex, HTN, PE    Patient has failed these meds in past: none Patient is currently controlled on the following medications:   Eliquis 2.5 mg 1 tablet twice daily  Amiodarone 200 mg 1 tablet daily  We discussed:  monitoring pulse along with blood pressure; Monitoring for signs of bleeding such as unexplained and excessive bleeding from a cut or injury, easy or excessive bruising, blood in urine or stools, and nosebleeds without a known cause  Plan  Continue current medications    Diabetes/PreDM   Recent Relevant  Labs: Lab Results  Component Value Date/Time   HGBA1C 5.9 (A) 01/02/2020 09:30 AM   HGBA1C 6.5 06/29/2019 08:21 AM   HGBA1C 6.3 (H) 04/02/2012 04:20 PM   GFR 25.69 (L) 06/29/2019 08:21 AM   GFR 38.62 (L) 03/11/2018 11:08 AM    Last diabetic eye exam: No results found for: HMDIABEYEEXA  Last diabetic foot exam: No results found for: HMDIABFOOTEX   Patient has failed these meds in past: none Patient is currently controlled on the following  medications:  No medications  We discussed: diet and exercise extensively and A1c and how this measures blood sugars  -Patient has continued to make dietary changes and congratulated her on A1c improvement  Plan Continue control with diet and exercise   Hypothyroidism   Lab Results  Component Value Date/Time   TSH 2.620 10/30/2019 11:33 AM   TSH 3.980 04/17/2019 03:29 PM    Patient has failed these meds in past: none Patient is currently controlled on the following medications:   Levothyroxine 25 mcg 1 tablet daily  We discussed:  making sure to take the medicine on an empty stomach  Plan  Continue current medications  Pulmonary hypertension   Patient has failed these meds in past: none Patient is currently uncontrolled on the following medications:   Furosemide 20 mg 1 tablet daily  Furosemide 40 mg 1 tablet alternating with 1/2 tablet every other day  We discussed:  -Checking package labels and goal of sodium intake of <1500 mg/day -Checking for swelling around feet and abdomen every day -Reaching out to cardiologist with signs of excess swelling -Elevating her feet  Plan Discuss with cardiologist if patient needs to weigh herself daily. Continue current medications   Osteoporosis    Patient reports she has had a DEXA scan in the past but was unable to find record. Patient reports a fracture history.  No results found for: VD25OH   Patient has failed these meds in past: Fosamax (completed 5 years of  therapy) Patient is currently controlled on the following medications:   Calcium  Vitamin D   We discussed:  Recommend [phone] units of vitamin D daily. Recommend 1200 mg of calcium daily from dietary and supplemental sources.   Plan Will consult PCP for repeat DEXA plans.  Chronic back pain   Patient is currently controlled on the following medications:   Tylenol 650 mg used as needed  We discussed:  The maximum recommended dose of Tylenol for day of 3,000 mg/day; diclofenac gel (Voltaren gel - recommended)  Plan  Continue current medications   Vaccines   Reviewed and discussed patient's vaccination history.    Immunization History  Administered Date(s) Administered   Fluad Quad(high Dose 65+) 12/06/2018   Influenza Split 12/15/2010, 12/14/2011, 10/27/2014   Influenza Whole 10/09/2009   Influenza, High Dose Seasonal PF 12/13/2012, 11/04/2014, 10/28/2017, 11/06/2019   Influenza,inj,Quad PF,6+ Mos 12/18/2013   Influenza-Unspecified 11/26/2015, 10/30/2016   PFIZER(Purple Top)SARS-COV-2 Vaccination 04/22/2019, 05/13/2019, 11/06/2019   Pneumococcal Conjugate-13 05/31/2013   Pneumococcal Polysaccharide-23 06/13/2010   Tdap 06/13/2010   Plan   Recommended patient receive Shingrix vaccine at pharmacy.   Medication Management   [facility] Uses pill box? No - husband sets out medicines for her and keeps track Pt endorses 100% compliance  Plan  Continue current medication management strategy   Follow up: 4 month phone visit  Jeni Salles, PharmD Clinical Pharmacist [place] [facility] [place] [phone]

## 2020-03-07 ENCOUNTER — Telehealth: Payer: Self-pay | Admitting: Cardiology

## 2020-03-07 MED ORDER — APIXABAN 2.5 MG PO TABS: 2.5000 mg | ORAL_TABLET | Freq: Two times a day (BID) | ORAL | 3 refills | Status: DC

## 2020-03-07 NOTE — Telephone Encounter (Signed)
Refill sent as requested ./cy 

## 2020-03-07 NOTE — Telephone Encounter (Signed)
*  STAT* If patient is at the pharmacy, call can be transferred to refill team.   1. Which medications need to be refilled? (please list name of each medication and dose if known)  apixaban (ELIQUIS) 2.5 MG TABS tablet  2. Which pharmacy/location (including street and city if local pharmacy) is medication to be sent to? [id number] [place] [place] - [address]  3. Do they need a 30 day or 90 day supply? 30 with refills   Patient is out of medication

## 2020-04-16 NOTE — Progress Notes (Signed)
HPI: FU syncope, ASDand atrial fibrillation. Patient with history of syncope felt secondary to autonomic dysfunction. Previously loop monitor considered; Dr Lovena Le felt it would be indicated if recurrent syncope. H/O pulmonary embolus; seen by pulmonary and chronic anticoagulation felt indicated.Holter monitor November 2018 showed atrial fibrillation with PVCs or aberrantly conducted beats rate controlled. Patient previously placed on amiodarone and underwent successful cardioversion to sinus rhythm February 2019. Echocardiogram August 2020 showed normal LV function, severe RV dysfunction, severe right ventricular enlargement, moderate left atrial enlargement, severe right atrial enlargement, severe tricuspid regurgitation, mild aortic insufficiency. Follow-up study September 2020 showed moderate to large secundum atrial septal defect with bidirectional shunting.ABIs May 2021 normal on the right and severe on the left. Peripheral vascular disease followed by Dr. Scot Dock.Echocardiogram repeated December 2021 and showed normal LV function, moderate left ventricular hypertrophy, grade 2 diastolic dysfunction, severe pulmonary hypertension, moderate right atrial enlargement, mild mitral regurgitation, moderate to severe tricuspid regurgitation, mild aortic insufficiency. Lasix increased at last office visit due to worsening dyspnea/pedal edema.  Since last seen, she has dyspnea on exertion unchanged.  No orthopnea or PND.  Mild pedal edema.  She denies chest pain or syncope.  Current Outpatient Medications  Medication Sig Dispense Refill  . acetaminophen (TYLENOL) 650 MG CR tablet Take 1,300 mg by mouth every 8 (eight) hours as needed for pain.    Marland Kitchen amiodarone (PACERONE) 200 MG tablet TAKE ONE TABLET TWICE DAILY FOR ONE WEEK THEN DECREASE TO 1 TABLET DAILY 135 tablet 3  . amLODipine (NORVASC) 10 MG tablet TAKE 1 TABLET BY MOUTH EVERY DAY 90 tablet 3  . apixaban (ELIQUIS) 2.5 MG TABS tablet Take 1  tablet (2.5 mg total) by mouth 2 (two) times daily. 60 tablet 3  . atorvastatin (LIPITOR) 20 MG tablet TAKE 1 TABLET (20 MG TOTAL) BY MOUTH DAILY AT 6 PM. 90 tablet 3  . furosemide (LASIX) 40 MG tablet TAKE 1 TABLET BY MOUTH EVERY DAY 90 tablet 3  . levothyroxine (SYNTHROID) 25 MCG tablet Take 1 tablet (25 mcg total) by mouth daily. 90 tablet 3   No current facility-administered medications for this visit.     Past Medical History:  Diagnosis Date  . Arthritis   . BREAST BIOPSY, HX OF 06/10/2006  . Breast cyst   . Dysrhythmia   . HIP PAIN, LEFT 01/31/2007  . HYPERLIPIDEMIA 06/10/2006  . HYPERTENSION 06/10/2006  . Impaired glucose tolerance   . Lower extremity edema   . NEOPLASMS UNSPEC NATURE BONE SOFT TISSUE&SKIN 05/31/2008  . OSTEOPOROSIS 06/10/2006  . Pulmonary embolus [facility])     Past Surgical History:  Procedure Laterality Date  . ABDOMINAL HYSTERECTOMY    . APPENDECTOMY    . BREAST SURGERY     bx x2  . CARDIOVERSION N/A 03/11/2017   Procedure: CARDIOVERSION;  Surgeon: Sanda Klein, MD;  Location: [place] [facility];  Service: Cardiovascular;  Laterality: N/A;  . CHOLECYSTECTOMY  1980  . TRANSFORAMINAL LUMBAR INTERBODY FUSION (TLIF) WITH PEDICLE SCREW FIXATION 1 LEVEL Left 10/27/2018   Procedure: Left Lumbar five-sacral one Transforaminal lumbar interbody fusion;  Surgeon: Erline Levine, MD;  Location: [place];  Service: Neurosurgery;  Laterality: Left;    Social History   Socioeconomic History  . Marital status: Married    Spouse name: Not on file  . Number of children: 4  . Years of education: Not on file  . Highest education level: Not on file  Occupational History  . Not on file  Tobacco Use  .  Smoking status: Never Smoker  . Smokeless tobacco: Current User    Types: Snuff  Vaping Use  . Vaping Use: Never used  Substance and Sexual Activity  . Alcohol use: No    Alcohol/week: 0.0 standard drinks  . Drug use: No  . Sexual activity: Not Currently  Other Topics Concern   . Not on file  Social History Narrative   She is retired    Worked in [employer] for 20 years    Married for 43 years    4 children - live locally.        Social Determinants of Health   Financial Resource Strain: Low Risk   . Difficulty of Paying Living Expenses: Not hard at all  Food Insecurity: No Food Insecurity  . Worried About Charity fundraiser in the Last Year: Never true  . Ran Out of Food in the Last Year: Never true  Transportation Needs: No Transportation Needs  . Lack of Transportation (Medical): No  . Lack of Transportation (Non-Medical): No  Physical Activity: Inactive  . Days of Exercise per Week: 0 days  . Minutes of Exercise per Session: 0 min  Stress: No Stress Concern Present  . Feeling of Stress : Not at all  Social Connections: Moderately Integrated  . Frequency of Communication with Friends and Family: More than three times a week  . Frequency of Social Gatherings with Friends and Family: Once a week  . Attends Religious Services: More than 4 times per year  . Active Member of Clubs or Organizations: No  . Attends Archivist Meetings: Never  . Marital Status: Married  Human resources officer Violence: Not At Risk  . Fear of Current or Ex-Partner: No  . Emotionally Abused: No  . Physically Abused: No  . Sexually Abused: No    Family History  Problem Relation Age of Onset  . Heart disease Mother        Pacemaker  . Heart disease Father   . Asthma Father   . Diabetes Father   . Cancer Maternal Aunt        breast  . Heart disease Sister   . Hyperlipidemia Sister   . Hypertension Sister   . Lung cancer Sister     ROS: Back and hip pain but no fevers or chills, productive cough, hemoptysis, dysphasia, odynophagia, melena, hematochezia, dysuria, hematuria, rash, seizure activity, orthopnea, PND, claudication. Remaining systems are negative.  Physical Exam: Well-developed well-nourished in no acute distress.  Skin is warm and dry.  HEENT  is normal.  Neck is supple.  Chest is clear to auscultation with normal expansion.  Cardiovascular exam is regular rate and rhythm.  Abdominal exam nontender or distended. No masses palpated. Extremities show 1+ edema. neuro grossly intact  A/P  1 chronic right-sided heart failure-volume status is essentially unchanged today.  We will continue Lasix at present dose.  Check potassium and renal function.  2 paroxysmal atrial fibrillation-patient remains in sinus rhythm on exam.  Continue apixaban.  Note her beta-blocker was discontinued previously due to bradycardia.  3 history of ASD-patient wants only conservative measures with no procedures which I think is reasonable given her age and overall medical condition.  4 hypertension-blood pressure controlled.  Continue present medications and follow.  5 hyperlipidemia-continue statin.  6 tricuspid regurgitation-conservative measures given patient's age.  7 history of syncope-no recurrences.  8 peripheral vascular disease-plan to continue medical therapy.  She is not on aspirin given need for anticoagulation.  9  chronic stage III kidney disease-check potassium and renal function.  Kirk Ruths, MD

## 2020-04-22 ENCOUNTER — Ambulatory Visit: Payer: Medicare HMO | Admitting: Cardiology

## 2020-04-22 ENCOUNTER — Other Ambulatory Visit: Payer: Self-pay

## 2020-04-22 ENCOUNTER — Encounter: Payer: Self-pay | Admitting: Cardiology

## 2020-04-22 VITALS — BP 142/60 | HR 68 | Ht 67.0 in | Wt 171.0 lb

## 2020-04-22 DIAGNOSIS — I130 Hypertensive heart and chronic kidney disease with heart failure and stage 1 through stage 4 chronic kidney disease, or unspecified chronic kidney disease: Secondary | ICD-10-CM | POA: Diagnosis not present

## 2020-04-22 DIAGNOSIS — Q211 Atrial septal defect, unspecified: Secondary | ICD-10-CM

## 2020-04-22 DIAGNOSIS — R55 Syncope and collapse: Secondary | ICD-10-CM

## 2020-04-22 DIAGNOSIS — I079 Rheumatic tricuspid valve disease, unspecified: Secondary | ICD-10-CM

## 2020-04-22 DIAGNOSIS — I480 Paroxysmal atrial fibrillation: Secondary | ICD-10-CM

## 2020-04-22 DIAGNOSIS — I739 Peripheral vascular disease, unspecified: Secondary | ICD-10-CM | POA: Diagnosis not present

## 2020-04-22 DIAGNOSIS — I10 Essential (primary) hypertension: Secondary | ICD-10-CM

## 2020-04-22 DIAGNOSIS — I50812 Chronic right heart failure: Secondary | ICD-10-CM | POA: Diagnosis not present

## 2020-04-22 DIAGNOSIS — N183 Chronic kidney disease, stage 3 unspecified: Secondary | ICD-10-CM | POA: Diagnosis not present

## 2020-04-22 LAB — BASIC METABOLIC PANEL
BUN/Creatinine Ratio: 18 (ref 12–28)
BUN: 30 mg/dL — ABNORMAL HIGH (ref 8–27)
CO2: 20 mmol/L (ref 20–29)
Calcium: 9.7 mg/dL (ref 8.7–10.3)
Chloride: 104 mmol/L (ref 96–106)
Creatinine, Ser: 1.7 mg/dL — ABNORMAL HIGH (ref 0.57–1.00)
Glucose: 173 mg/dL — ABNORMAL HIGH (ref 65–99)
Potassium: 4.6 mmol/L (ref 3.5–5.2)
Sodium: 143 mmol/L (ref 134–144)
eGFR: 29 mL/min/{1.73_m2} — ABNORMAL LOW (ref >59)

## 2020-04-22 NOTE — Patient Instructions (Signed)

## 2020-04-23 ENCOUNTER — Encounter: Payer: Self-pay | Admitting: *Deleted

## 2020-05-15 ENCOUNTER — Other Ambulatory Visit: Payer: Self-pay | Admitting: Cardiology

## 2020-06-03 DIAGNOSIS — H903 Sensorineural hearing loss, bilateral: Secondary | ICD-10-CM | POA: Diagnosis not present

## 2020-06-20 ENCOUNTER — Telehealth: Payer: Self-pay | Admitting: Pharmacist

## 2020-06-20 NOTE — Chronic Care Management (AMB) (Signed)
Chronic Care Management Pharmacy Assistant   Name: Tammy [name]  MRN: [id number] DOB: 08/07/1931  Reason for Encounter: Disease State-Hypertension Adherence Call   Recent office visits:  None  Recent consult visits:  . 03.28.2022 Lelon Perla, MD Cardiology patient present for follow up on atrial fibrillation  Hospital visits:  None in previous 6 months  Medications: Outpatient Encounter Medications as of 06/20/2020  Medication Sig  . acetaminophen (TYLENOL) 650 MG CR tablet Take 1,300 mg by mouth every 8 (eight) hours as needed for pain.  Marland Kitchen amiodarone (PACERONE) 200 MG tablet TAKE ONE TABLET TWICE DAILY FOR ONE WEEK THEN DECREASE TO 1 TABLET DAILY  . amLODipine (NORVASC) 10 MG tablet TAKE 1 TABLET BY MOUTH EVERY DAY  . apixaban (ELIQUIS) 2.5 MG TABS tablet Take 1 tablet (2.5 mg total) by mouth 2 (two) times daily.  Marland Kitchen atorvastatin (LIPITOR) 20 MG tablet TAKE 1 TABLET (20 MG TOTAL) BY MOUTH DAILY AT 6 PM.  . furosemide (LASIX) 40 MG tablet TAKE 1 TABLET BY MOUTH EVERY DAY  . levothyroxine (SYNTHROID) 25 MCG tablet Take 1 tablet (25 mcg total) by mouth daily.   No facility-administered encounter medications on file as of 06/20/2020.   Reviewed chart prior to disease state call. Spoke with patient regarding BP  Recent Office Vitals: BP Readings from Last 3 Encounters:  04/22/20 (!) 142/60  01/08/20 (!) 126/52  01/02/20 (!) 148/60   Pulse Readings from Last 3 Encounters:  04/22/20 68  01/08/20 84  01/02/20 70    Wt Readings from Last 3 Encounters:  04/22/20 171 lb (77.6 kg)  01/08/20 170 lb (77.1 kg)  01/02/20 171 lb 9.6 oz (77.8 kg)     Kidney Function Lab Results  Component Value Date/Time   CREATININE 1.70 (H) 04/22/2020 11:47 AM   CREATININE 1.60 (H) 01/08/2020 12:24 PM   CREATININE 1.10 (H) 11/05/2014 11:51 AM   CREATININE 1.08 04/13/2014 09:02 AM   GFR 25.69 (L) 06/29/2019 08:21 AM   GFRNONAA 29 (L) 01/08/2020 12:24 PM   GFRNONAA 56 (L) 03/30/2014 09:31  AM   GFRAA 33 (L) 01/08/2020 12:24 PM   GFRAA 65 03/30/2014 09:31 AM    BMP Latest Ref Rng & Units 04/22/2020 01/08/2020 12/18/2019  Glucose 65 - 99 mg/dL 173(H) 138(H) 109(H)  BUN 8 - 27 mg/dL 30(H) 32(H) 40(H)  Creatinine 0.57 - 1.00 mg/dL 1.70(H) 1.60(H) 1.43(H)  BUN/Creat Ratio 12 - 28 18 20 28   Sodium 134 - 144 mmol/L 143 143 143  Potassium 3.5 - 5.2 mmol/L 4.6 5.0 4.6  Chloride 96 - 106 mmol/L 104 105 104  CO2 20 - 29 mmol/L 20 21 22   Calcium 8.7 - 10.3 mg/dL 9.7 9.8 9.5    . Current antihypertensive regimen:  o Amlodipine 10 mg tablet daily . How often are you checking your Blood Pressure? 1-2x per week . Current home BP readings:  o 05.13 132/68 o 05.17 138/70 o 05.18 140/66 o 05.23 136/72 . What recent interventions/DTPs have been made by any provider to improve Blood Pressure control since last CPP Visit: None . Any recent hospitalizations or ED visits since last visit with CPP? No . What diet changes have been made to improve Blood Pressure Control?  o No Change . What exercise is being done to improve your Blood Pressure Control?  o No Change  Adherence Review: Is the patient currently on ACE/ARB medication? No Does the patient have >5 day gap between last estimated fill dates? Yes  I spoke with the patient about medication adherence. She states that she has been doing well. She continues to take her blood pressure two to three times a week. She states that there been no recent falls or injuries. She said that she has seen [facility] and  will be getting new hearing aids. There have been no changes to her medications. She states that she is not experiencing any side effects from her current medication. There have been no urgent care or emergency department visits since her last PCP or CPP visit. A new CCM appointment made for June 15th at 11:00 am. No issues with her current pharmacy.  Star Rating Drugs:  Dispensed Quantity Pharmacy  Atorvastatin 20 mg  03.29.2022 [facility]   Amilia (Dawson) Mare Ferrari, [place] Pharmacist Assistant [phone]

## 2020-07-02 ENCOUNTER — Encounter: Payer: Self-pay | Admitting: Adult Health

## 2020-07-02 ENCOUNTER — Other Ambulatory Visit: Payer: Self-pay | Admitting: Adult Health

## 2020-07-02 ENCOUNTER — Other Ambulatory Visit: Payer: Self-pay

## 2020-07-02 ENCOUNTER — Ambulatory Visit (INDEPENDENT_AMBULATORY_CARE_PROVIDER_SITE_OTHER): Payer: Medicare HMO | Admitting: Adult Health

## 2020-07-02 VITALS — BP 140/60 | HR 76 | Temp 97.9°F | Ht 66.5 in | Wt 169.0 lb

## 2020-07-02 DIAGNOSIS — R600 Localized edema: Secondary | ICD-10-CM | POA: Diagnosis not present

## 2020-07-02 DIAGNOSIS — E785 Hyperlipidemia, unspecified: Secondary | ICD-10-CM | POA: Diagnosis not present

## 2020-07-02 DIAGNOSIS — Z0000 Encounter for general adult medical examination without abnormal findings: Secondary | ICD-10-CM | POA: Diagnosis not present

## 2020-07-02 DIAGNOSIS — I739 Peripheral vascular disease, unspecified: Secondary | ICD-10-CM | POA: Diagnosis not present

## 2020-07-02 DIAGNOSIS — I2699 Other pulmonary embolism without acute cor pulmonale: Secondary | ICD-10-CM

## 2020-07-02 DIAGNOSIS — I10 Essential (primary) hypertension: Secondary | ICD-10-CM | POA: Diagnosis not present

## 2020-07-02 DIAGNOSIS — E039 Hypothyroidism, unspecified: Secondary | ICD-10-CM

## 2020-07-02 DIAGNOSIS — I480 Paroxysmal atrial fibrillation: Secondary | ICD-10-CM | POA: Diagnosis not present

## 2020-07-02 DIAGNOSIS — N183 Chronic kidney disease, stage 3 unspecified: Secondary | ICD-10-CM | POA: Diagnosis not present

## 2020-07-02 LAB — COMPREHENSIVE METABOLIC PANEL
ALT: 23 U/L (ref 0–35)
AST: 27 U/L (ref 0–37)
Albumin: 4.3 g/dL (ref 3.5–5.2)
Alkaline Phosphatase: 105 U/L (ref 39–117)
BUN: 37 mg/dL — ABNORMAL HIGH (ref 6–23)
CO2: 23 mEq/L (ref 19–32)
Calcium: 9.8 mg/dL (ref 8.4–10.5)
Chloride: 104 mEq/L (ref 96–112)
Creatinine, Ser: 1.66 mg/dL — ABNORMAL HIGH (ref 0.40–1.20)
GFR: 27.69 mL/min — ABNORMAL LOW (ref >60.00)
Glucose, Bld: 121 mg/dL — ABNORMAL HIGH (ref 70–99)
Potassium: 4.5 mEq/L (ref 3.5–5.1)
Sodium: 143 mEq/L (ref 135–145)
Total Bilirubin: 0.7 mg/dL (ref 0.2–1.2)
Total Protein: 7.5 g/dL (ref 6.0–8.3)

## 2020-07-02 LAB — CBC WITH DIFFERENTIAL/PLATELET
Basophils Absolute: 0 10*3/uL (ref 0.0–0.1)
Basophils Relative: 0.4 % (ref 0.0–3.0)
Eosinophils Absolute: 0.2 10*3/uL (ref 0.0–0.7)
Eosinophils Relative: 1.9 % (ref 0.0–5.0)
HCT: 38.9 % (ref 36.0–46.0)
Hemoglobin: 13.2 g/dL (ref 12.0–15.0)
Lymphocytes Relative: 32.7 % (ref 12.0–46.0)
Lymphs Abs: 2.7 10*3/uL (ref 0.7–4.0)
MCHC: 34 g/dL (ref 30.0–36.0)
MCV: 93.9 fl (ref 78.0–100.0)
Monocytes Absolute: 0.8 10*3/uL (ref 0.1–1.0)
Monocytes Relative: 9.5 % (ref 3.0–12.0)
Neutro Abs: 4.7 10*3/uL (ref 1.4–7.7)
Neutrophils Relative %: 55.5 % (ref 43.0–77.0)
Platelets: 300 10*3/uL (ref 150.0–400.0)
RBC: 4.15 Mil/uL (ref 3.87–5.11)
RDW: 15.7 % — ABNORMAL HIGH (ref 11.5–15.5)
WBC: 8.4 10*3/uL (ref 4.0–10.5)

## 2020-07-02 LAB — LIPID PANEL
Cholesterol: 139 mg/dL (ref 0–200)
HDL: 64.2 mg/dL (ref >39.00)
LDL Cholesterol: 57 mg/dL (ref 0–99)
NonHDL: 75.08
Total CHOL/HDL Ratio: 2
Triglycerides: 88 mg/dL (ref 0.0–149.0)
VLDL: 17.6 mg/dL (ref 0.0–40.0)

## 2020-07-02 LAB — TSH: TSH: 2.3 u[IU]/mL (ref 0.35–4.50)

## 2020-07-02 MED ORDER — LEVOTHYROXINE SODIUM 25 MCG PO TABS: 25.0000 ug | ORAL_TABLET | Freq: Every day | ORAL | 3 refills | Status: DC

## 2020-07-02 MED ORDER — AMLODIPINE BESYLATE 10 MG PO TABS: 1.0000 | ORAL_TABLET | Freq: Every day | ORAL | 3 refills | Status: DC

## 2020-07-02 MED ORDER — ATORVASTATIN CALCIUM 20 MG PO TABS: 20.0000 mg | ORAL_TABLET | Freq: Every day | ORAL | 3 refills | Status: DC

## 2020-07-02 NOTE — Progress Notes (Signed)
Subjective:    Patient ID: Tammy [name], female    DOB: 02-May-1931, 87 y.o.   MRN: [id number]  HPI Patient presents for yearly preventative medicine examination. She is a pleasant 87 year old female who  has a past medical history of Arthritis, BREAST BIOPSY, HX OF (06/10/2006), Breast cyst, Dysrhythmia, HIP PAIN, LEFT (01/31/2007), HYPERLIPIDEMIA (06/10/2006), HYPERTENSION (06/10/2006), Impaired glucose tolerance, Lower extremity edema, NEOPLASMS UNSPEC NATURE BONE SOFT TISSUE&SKIN (05/31/2008), OSTEOPOROSIS (06/10/2006), and Pulmonary embolus ([place]).  Hyperlipidemia -currently prescribed Lipitor 20 mg daily.  She denies myalgia or fatigue Lab Results  Component Value Date   CHOL 122 06/29/2019   HDL 64.40 06/29/2019   LDLCALC 43 06/29/2019   TRIG 71.0 06/29/2019   CHOLHDL 2 06/29/2019    Hypothyroidism -Synthroid 25 mcg every morning.  She does feel well controlled on this dose Lab Results  Component Value Date   TSH 2.620 10/30/2019   Hypertension -prescribed Norvasc 10 mg and 40 mg Lasix daily.  She denies dizziness, lightheadedness, chest pain BP Readings from Last 3 Encounters:  07/02/20 140/60  04/22/20 (!) 142/60  01/08/20 (!) 126/52   PAF -followed by cardiology.  Currently prescribed amiodarone and Eliquis.  She underwent a successful cardioversion to sinus rhythm in February 2019  PVD-followed by vascular surgery.  H/o PE -on Eliquis.  She denies chest pain or bleeding issues.  Does have dyspnea on exertion that has been unchanged  CKD III- takes lasix 40 mg daily.   All immunizations and health maintenance protocols were reviewed with the patient and needed orders were placed.  Appropriate screening laboratory values were ordered for the patient including screening of hyperlipidemia, renal function and hepatic function.   Medication reconciliation,  past medical history, social history, problem list and allergies were reviewed in detail with the patient  Goals were  established with regard to weight loss, exercise, and  diet in compliance with medications  Review of Systems  Constitutional: Negative.   HENT: Negative.   Eyes: Negative.   Respiratory: Positive for shortness of breath.   Cardiovascular: Positive for leg swelling.  Gastrointestinal: Negative.   Endocrine: Negative.   Genitourinary: Negative.   Musculoskeletal: Positive for arthralgias and back pain.  Skin: Negative.   Allergic/Immunologic: Negative.   Neurological: Negative.   Hematological: Negative.   Psychiatric/Behavioral: Negative.    Past Medical History:  Diagnosis Date  . Arthritis   . BREAST BIOPSY, HX OF 06/10/2006  . Breast cyst   . Dysrhythmia   . HIP PAIN, LEFT 01/31/2007  . HYPERLIPIDEMIA 06/10/2006  . HYPERTENSION 06/10/2006  . Impaired glucose tolerance   . Lower extremity edema   . NEOPLASMS UNSPEC NATURE BONE SOFT TISSUE&SKIN 05/31/2008  . OSTEOPOROSIS 06/10/2006  . Pulmonary embolus [facility])     Social History   Socioeconomic History  . Marital status: Married    Spouse name: Not on file  . Number of children: 4  . Years of education: Not on file  . Highest education level: Not on file  Occupational History  . Not on file  Tobacco Use  . Smoking status: Never Smoker  . Smokeless tobacco: Current User    Types: Snuff  Vaping Use  . Vaping Use: Never used  Substance and Sexual Activity  . Alcohol use: No    Alcohol/week: 0.0 standard drinks  . Drug use: No  . Sexual activity: Not Currently  Other Topics Concern  . Not on file  Social History Narrative   She is retired  Worked in [employer] for 20 years    Married for 2 years    4 children - live locally.        Social Determinants of Health   Financial Resource Strain: Low Risk   . Difficulty of Paying Living Expenses: Not hard at all  Food Insecurity: No Food Insecurity  . Worried About Charity fundraiser in the Last Year: Never true  . Ran Out of Food in the Last Year: Never true   Transportation Needs: No Transportation Needs  . Lack of Transportation (Medical): No  . Lack of Transportation (Non-Medical): No  Physical Activity: Inactive  . Days of Exercise per Week: 0 days  . Minutes of Exercise per Session: 0 min  Stress: No Stress Concern Present  . Feeling of Stress : Not at all  Social Connections: Moderately Integrated  . Frequency of Communication with Friends and Family: More than three times a week  . Frequency of Social Gatherings with Friends and Family: Once a week  . Attends Religious Services: More than 4 times per year  . Active Member of Clubs or Organizations: No  . Attends Archivist Meetings: Never  . Marital Status: Married  Human resources officer Violence: Not At Risk  . Fear of Current or Ex-Partner: No  . Emotionally Abused: No  . Physically Abused: No  . Sexually Abused: No    Past Surgical History:  Procedure Laterality Date  . ABDOMINAL HYSTERECTOMY    . APPENDECTOMY    . BREAST SURGERY     bx x2  . CARDIOVERSION N/A 03/11/2017   Procedure: CARDIOVERSION;  Surgeon: Sanda Klein, MD;  Location: [place] [facility];  Service: Cardiovascular;  Laterality: N/A;  . CHOLECYSTECTOMY  1980  . TRANSFORAMINAL LUMBAR INTERBODY FUSION (TLIF) WITH PEDICLE SCREW FIXATION 1 LEVEL Left 10/27/2018   Procedure: Left Lumbar five-sacral one Transforaminal lumbar interbody fusion;  Surgeon: Erline Levine, MD;  Location: [place];  Service: Neurosurgery;  Laterality: Left;    Family History  Problem Relation [name] of Onset  . Heart disease Mother        Pacemaker  . Heart disease Father   . Asthma Father   . Diabetes Father   . Cancer Maternal Aunt        breast  . Heart disease Sister   . Hyperlipidemia Sister   . Hypertension Sister   . Lung cancer Sister     Allergies  Allergen Reactions  . Tuberculin Tests Other (See Comments)    Tuberculosis shot, red spot on arm    Current Outpatient Medications on File Prior to Visit  Medication  Sig Dispense Refill  . acetaminophen (TYLENOL) 650 MG CR tablet Take 1,300 mg by mouth every 8 (eight) hours as needed for pain.    Marland Kitchen amiodarone (PACERONE) 200 MG tablet TAKE ONE TABLET TWICE DAILY FOR ONE WEEK THEN DECREASE TO 1 TABLET DAILY 90 tablet 3  . amLODipine (NORVASC) 10 MG tablet TAKE 1 TABLET BY MOUTH EVERY DAY 90 tablet 3  . apixaban (ELIQUIS) 2.5 MG TABS tablet Take 1 tablet (2.5 mg total) by mouth 2 (two) times daily. 60 tablet 3  . atorvastatin (LIPITOR) 20 MG tablet TAKE 1 TABLET (20 MG TOTAL) BY MOUTH DAILY AT 6 PM. 90 tablet 3  . furosemide (LASIX) 40 MG tablet TAKE 1 TABLET BY MOUTH EVERY DAY 90 tablet 3  . levothyroxine (SYNTHROID) 25 MCG tablet Take 1 tablet (25 mcg total) by mouth daily. 90 tablet 3  No current facility-administered medications on file prior to visit.    BP 140/60   Pulse 76   Temp 97.9 F (36.6 C) (Oral)   Ht 5' 6.5" (1.689 m)   Wt 169 lb (76.7 kg)   LMP  (LMP Unknown)   SpO2 98%   BMI 26.87 kg/m       Objective:   Physical Exam Vitals and nursing note reviewed.  Constitutional:      General: She is not in acute distress.    Appearance: Normal appearance. She is well-developed. She is not ill-appearing.  HENT:     Head: Normocephalic and atraumatic.     Right Ear: Tympanic membrane, ear canal and external ear normal. There is no impacted cerumen.     Left Ear: Tympanic membrane, ear canal and external ear normal. There is no impacted cerumen.     Nose: Nose normal. No congestion or rhinorrhea.     Mouth/Throat:     Mouth: Mucous membranes are moist.     Pharynx: Oropharynx is clear. No oropharyngeal exudate or posterior oropharyngeal erythema.  Eyes:     General:        Right eye: No discharge.        Left eye: No discharge.     Extraocular Movements: Extraocular movements intact.     Conjunctiva/sclera: Conjunctivae normal.     Pupils: Pupils are equal, round, and reactive to light.  Neck:     Thyroid: No thyromegaly.      Vascular: No carotid bruit.     Trachea: No tracheal deviation.  Cardiovascular:     Rate and Rhythm: Normal rate and regular rhythm.     Pulses: Normal pulses.     Heart sounds: Normal heart sounds. No murmur heard. No friction rub. No gallop.   Pulmonary:     Effort: Pulmonary effort is normal. No respiratory distress.     Breath sounds: Normal breath sounds. No stridor. No wheezing, rhonchi or rales.  Chest:     Chest wall: No tenderness.  Abdominal:     General: Abdomen is flat. Bowel sounds are normal. There is no distension.     Palpations: Abdomen is soft. There is no mass.     Tenderness: There is no abdominal tenderness. There is no right CVA tenderness, left CVA tenderness, guarding or rebound.     Hernia: No hernia is present.  Musculoskeletal:        General: No swelling, tenderness, deformity or signs of injury. Normal range of motion.     Cervical back: Normal range of motion and neck supple.     Right lower leg: Edema present.     Left lower leg: Edema present.  Lymphadenopathy:     Cervical: No cervical adenopathy.  Skin:    General: Skin is warm and dry.     Coloration: Skin is not jaundiced or pale.     Findings: No bruising, erythema, lesion or rash.  Neurological:     General: No focal deficit present.     Mental Status: She is alert and oriented to person, place, and time.     Cranial Nerves: No cranial nerve deficit.     Sensory: No sensory deficit.     Motor: No weakness.     Coordination: Coordination normal.     Gait: Gait normal.     Deep Tendon Reflexes: Reflexes normal.  Psychiatric:        Mood and Affect: Mood normal.  Behavior: Behavior normal.        Thought Content: Thought content normal.        Judgment: Judgment normal.       Assessment & Plan:  1. Routine general medical examination at a health care facility - Follow up in one year or sooner if needed - CBC with Differential/Platelet; Future - Comprehensive metabolic panel;  Future - Lipid panel; Future - TSH; Future  2. Essential hypertension - At goal  - No change in medications  - CBC with Differential/Platelet; Future - Comprehensive metabolic panel; Future - Lipid panel; Future - TSH; Future  3. Paroxysmal atrial fibrillation (HCC) - Continue with Eliquis and Amiodarone  - CBC with Differential/Platelet; Future - Comprehensive metabolic panel; Future - Lipid panel; Future - TSH; Future  4. Dyslipidemia - Consider increase in statin  - CBC with Differential/Platelet; Future - Comprehensive metabolic panel; Future - Lipid panel; Future - TSH; Future  5. Hypothyroidism, unspecified type - Consider dose change of synthroid  - CBC with Differential/Platelet; Future - Comprehensive metabolic panel; Future - Lipid panel; Future - TSH; Future  6. PAD (peripheral artery disease) ([place]) - Follow up with vascular as directed - CBC with Differential/Platelet; Future - Comprehensive metabolic panel; Future - Lipid panel; Future - TSH; Future   8. Lower extremity edema - Continue with lasix 40 mg daily  - CBC with Differential/Platelet; Future - Comprehensive metabolic panel; Future - Lipid panel; Future - TSH; Future  9. Embolism, pulmonary with infarction ([place]) - Continue eliquis  - CBC with Differential/Platelet; Future - Comprehensive metabolic panel; Future - Lipid panel; Future - TSH; Future  [name], NP

## 2020-07-02 NOTE — Addendum Note (Signed)
Addended by: Elmer Picker on: 07/02/2020 09:27 AM   Modules accepted: Orders

## 2020-07-02 NOTE — Patient Instructions (Addendum)
It was great seeing you today   We will follow up with your blood work     I will see you back in one year or sooner if needed

## 2020-07-09 ENCOUNTER — Telehealth: Payer: Self-pay | Admitting: Pharmacist

## 2020-07-09 NOTE — Chronic Care Management (AMB) (Signed)
Date- Patient called to remind of appointment with Watt Climes on 07/10/2020 at  11:00 am  Patient aware of appointment date, time, and type of appointment ( telephone). Patient aware to have/bring all medications, supplements, blood pressure and/or blood sugar logs to visit.  Questions: Have you had any recent office visit or specialist visit outside of [place]? No Are there any concerns you would like to discuss during your office visit? No Are you having any problems obtaining your medications? (Whether it pharmacy issues or cost) No If patient has any PAP medications ask if they are having any problems getting their PAP medication or refill?  None  Star Rating Drug: Medication Dispensed Quantity Pharmacy  Atorvastatin 20 mg [facility]22 [facility]    Any gaps in medications fill history? No   Maia Breslow, [place] Pharmacist Assistant [phone]

## 2020-07-10 ENCOUNTER — Ambulatory Visit (INDEPENDENT_AMBULATORY_CARE_PROVIDER_SITE_OTHER): Payer: Medicare HMO | Admitting: Pharmacist

## 2020-07-10 DIAGNOSIS — I10 Essential (primary) hypertension: Secondary | ICD-10-CM | POA: Diagnosis not present

## 2020-07-10 DIAGNOSIS — I480 Paroxysmal atrial fibrillation: Secondary | ICD-10-CM

## 2020-07-10 NOTE — Progress Notes (Signed)
Chronic Care Management Pharmacy Note  07/25/2020 Name:  NAJMAH CARRADINE MRN:  [id number] DOB:  1931-06-03  Summary: BP is low at home  Recommendations/Changes made from today's visit: -Recommended for patient to confirm low readings with pharmacy BP monitoring -Recommended for patient to supplement with at least 600 mg of calcium every day and 1000 units of vitamin D based on dietary intake -Recommend repeat DEXA  Plan: Follow up BP assessment in 1 month   Subjective: DAYLANI DEBLOIS is an 87 y.o. year old female who is a primary patient of [name], NP.  The CCM team was consulted for assistance with disease management and care coordination needs.    Engaged with patient by telephone for follow up visit in response to provider referral for pharmacy case management and/or care coordination services.   Consent to Services:  The patient was given information about Chronic Care Management services, agreed to services, and gave verbal consent prior to initiation of services.  Please see initial visit note for detailed documentation.   Patient Care Team: [name], NP as PCP - General (Family Medicine) Stanford Breed Denice Bors, MD as PCP - Cardiology (Cardiology) Viona Gilmore, [facility] as Pharmacist (Pharmacist)  Recent office visits: 07/02/20 [name], NP: Patient presented for annual exam. No medication changes.  Recent consult visits: 04/22/20 Kirk Ruths, MD (cardiology): Patient presented for Afib follow up. No medication changes.  Hospital visits: None in previous 6 months   Objective:  Lab Results  Component Value Date   CREATININE 1.66 (H) 07/02/2020   BUN 37 (H) 07/02/2020   GFR 27.69 (L) 07/02/2020   GFRNONAA 29 (L) 01/08/2020   GFRAA 33 (L) 01/08/2020   NA 143 07/02/2020   K 4.5 07/02/2020   CALCIUM 9.8 07/02/2020   CO2 23 07/02/2020   GLUCOSE 121 (H) 07/02/2020    Lab Results  Component Value Date/Time   HGBA1C 5.9 (A) 01/02/2020 09:30 AM   HGBA1C  6.5 06/29/2019 08:21 AM   HGBA1C 6.3 (H) 04/02/2012 04:20 PM   GFR 27.69 (L) 07/02/2020 09:27 AM   GFR 25.69 (L) 06/29/2019 08:21 AM    Last diabetic Eye exam: No results found for: HMDIABEYEEXA  Last diabetic Foot exam: No results found for: HMDIABFOOTEX   Lab Results  Component Value Date   CHOL 139 07/02/2020   HDL 64.20 07/02/2020   LDLCALC 57 07/02/2020   TRIG 88.0 07/02/2020   CHOLHDL 2 07/02/2020    Hepatic Function Latest Ref Rng & Units 07/02/2020 10/30/2019 06/29/2019  Total Protein 6.0 - 8.3 g/dL 7.5 7.3 7.4  Albumin 3.5 - 5.2 g/dL 4.3 4.1 4.4  AST 0 - 37 U/L 27 36 34  ALT 0 - 35 U/L 23 30 32  Alk Phosphatase 39 - 117 U/L 105 103 102  Total Bilirubin 0.2 - 1.2 mg/dL 0.7 0.6 0.8  Bilirubin, Direct 0.00 - 0.40 mg/dL - 0.20 -    Lab Results  Component Value Date/Time   TSH 2.30 07/02/2020 09:27 AM   TSH 2.620 10/30/2019 11:33 AM    CBC Latest Ref Rng & Units 07/02/2020 12/18/2019 06/29/2019  WBC 4.0 - 10.5 K/uL 8.4 7.1 7.8  Hemoglobin 12.0 - 15.0 g/dL 13.2 13.3 14.1  Hematocrit 36.0 - 46.0 % 38.9 39.3 42.3  Platelets 150.0 - 400.0 K/uL 300.0 250 259.0    No results found for: VD25OH  Clinical ASCVD: No  The ASCVD Risk score Mikey Bussing DC Jr., et al., 2013) failed to calculate for the following  reasons:   The 2013 ASCVD risk score is only valid for ages 50 to 32    Depression screen PHQ 2/9 11/22/2019 06/29/2019 03/11/2018  Decreased Interest 0 0 0  Down, Depressed, Hopeless 0 0 0  PHQ - 2 Score 0 0 0  Altered sleeping 0 - -  Tired, decreased energy 0 - -  Change in appetite 0 - -  Feeling bad or failure about yourself  0 - -  Trouble concentrating 0 - -  Moving slowly or fidgety/restless 0 - -  Suicidal thoughts 0 - -  PHQ-9 Score 0 - -  Difficult doing work/chores Not difficult at all - -     CHA2DS2/VAS Stroke Risk Points  Current as of 11 days ago     6 >= 2 Points: High Risk  1 - 1.99 Points: Medium Risk  0 Points: Low Risk    Last Change: N/A       Details    This score determines the patient's risk of having a stroke if the  patient has atrial fibrillation.       Points Metrics  0 Has Congestive Heart Failure:  No    Current as of 11 days ago  0 Has Vascular Disease:  No    Current as of 11 days ago  1 Has Hypertension:  Yes    Current as of 11 days ago  2 Age:  60    Current as of 11 days ago  0 Has Diabetes:  No    Current as of 11 days ago  2 Had Stroke:  No  Had TIA:  No  Had Thromboembolism:  Yes     Current as of 11 days ago  1 Female:  Yes    Current as of 11 days ago     Social History   Tobacco Use  Smoking Status Never  Smokeless Tobacco Current   Types: Snuff   BP Readings from Last 3 Encounters:  07/02/20 140/60  04/22/20 (!) 142/60  01/08/20 (!) 126/52   Pulse Readings from Last 3 Encounters:  07/02/20 76  04/22/20 68  01/08/20 84   Wt Readings from Last 3 Encounters:  07/02/20 169 lb (76.7 kg)  04/22/20 171 lb (77.6 kg)  01/08/20 170 lb (77.1 kg)   BMI Readings from Last 3 Encounters:  07/02/20 26.87 kg/m  04/22/20 26.78 kg/m  01/08/20 26.63 kg/m    Assessment/Interventions: Review of patient past medical history, allergies, medications, health status, including review of consultants reports, laboratory and other test data, was performed as part of comprehensive evaluation and provision of chronic care management services.   SDOH:  (Social Determinants of Health) assessments and interventions performed: No  SDOH Screenings   Alcohol Screen: Low Risk    Last Alcohol Screening Score (AUDIT): 0  Depression (PHQ2-9): Low Risk    PHQ-2 Score: 0  Financial Resource Strain: Low Risk    Difficulty of Paying Living Expenses: Not hard at all  Food Insecurity: No Food Insecurity   Worried About Charity fundraiser in the Last Year: Never true   Ran Out of Food in the Last Year: Never true  Housing: Low Risk    Last Housing Risk Score: 0  Physical Activity: Inactive   Days of Exercise  per Week: 0 days   Minutes of Exercise per Session: 0 min  Social Connections: Moderately Integrated   Frequency of Communication with Friends and Family: More than three times a week  Frequency of Social Gatherings with Friends and Family: Once a week   Attends Religious Services: More than 4 times per year   Active Member of [employer] or Organizations: No   Attends Music therapist: Never   Marital Status: Married  Stress: No Stress Concern Present   Feeling of Stress : Not at all  Tobacco Use: High Risk   Smoking Tobacco Use: Never   Smokeless Tobacco Use: Current  Transportation Needs: No Transportation Needs   Lack of Transportation (Medical): No   Lack of Transportation (Non-Medical): No    CCM Care Plan  Allergies  Allergen Reactions   Tuberculin Tests Other (See Comments)    Tuberculosis shot, red spot on arm    Medications Reviewed Today     Reviewed by Franco Collet, CMA (Certified Medical Assistant) on 07/02/20 at [phone]  Med List Status: <None>   Medication Order Taking? Sig Documenting Provider Last Dose Status Informant  acetaminophen (TYLENOL) 650 MG CR tablet [id number] Yes Take 1,300 mg by mouth every 8 (eight) hours as needed for pain. [provider] Taking Active Spouse/Significant Other  amiodarone (PACERONE) 200 MG tablet [id number] Yes TAKE ONE TABLET TWICE DAILY FOR ONE WEEK THEN DECREASE TO 1 TABLET DAILY Crenshaw, Denice Bors, MD Taking Active   amLODipine (NORVASC) 10 MG tablet [id number] Yes TAKE 1 TABLET BY MOUTH EVERY DAY Nafziger, Tommi Rumps, NP Taking Active   apixaban (ELIQUIS) 2.5 MG TABS tablet [id number] Yes Take 1 tablet (2.5 mg total) by mouth 2 (two) times daily. Lelon Perla, MD Taking Active   atorvastatin (LIPITOR) 20 MG tablet [id number] Yes TAKE 1 TABLET (20 MG TOTAL) BY MOUTH DAILY AT 6 PM. Nafziger, Tommi Rumps, NP Taking Active   furosemide (LASIX) 40 MG tablet [id number] Yes TAKE 1 TABLET BY MOUTH EVERY DAY Crenshaw, Denice Bors, MD  Taking Active   levothyroxine (SYNTHROID) 25 MCG tablet [id number] Yes Take 1 tablet (25 mcg total) by mouth daily. 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[place]) 03/08/2014   Severe tricuspid regurgitation by prior echocardiogram 02/23/2014   Pulmonary hypertension ([place]) 02/23/2014   Syncope and collapse 02/23/2014   Pain in limb 06/07/2013   PAD (peripheral artery disease) ([place]) 06/01/2013   Syncope 04/02/2012   NEOPLASMS UNSPEC NATURE BONE SOFT TISSUE&SKIN 05/31/2008   Dyslipidemia 06/10/2006   Essential hypertension 06/10/2006   Osteoporosis 06/10/2006   BREAST BIOPSY, HX OF 06/10/2006    Immunization History  Administered Date(s) Administered   Fluad Quad(high Dose 65+) 12/06/2018   Influenza Split 12/15/2010, 12/14/2011, 10/27/2014   Influenza Whole 10/09/2009   Influenza, High Dose Seasonal PF 12/13/2012, 11/04/2014, 10/28/2017, 11/06/2019   Influenza,inj,Quad PF,6+ Mos 12/18/2013   Influenza-Unspecified 11/26/2015, 10/30/2016   PFIZER(Purple Top)SARS-COV-2 Vaccination 04/22/2019, 05/13/2019, 11/06/2019   Pneumococcal Conjugate-13 05/31/2013   Pneumococcal Polysaccharide-23 06/13/2010   Tdap 06/13/2010    Conditions to be addressed/monitored:  Hypertension, Hyperlipidemia, Atrial Fibrillation, Hypothyroidism, Osteoporosis, and Prediabetes and Pulmonary hypertension  Care Plan : [place]  Updates made by Viona Gilmore, [place] since 07/25/2020 12:00 AM     Problem: Problem: Hypertension, Hyperlipidemia, Atrial Fibrillation, Hypothyroidism, Osteoporosis, and Prediabetes and Pulmonary hypertension      Long-Range Goal: Patient-Specific Goal   Start Date: 07/10/2020  Expected End Date: 07/10/2021  This  Visit's Progress: On track  Priority: High  Note:   Current Barriers:   Unable to independently monitor therapeutic efficacy Suboptimal therapeutic regimen for osteoporosis  Pharmacist Clinical Goal(s):  Patient will achieve adherence to monitoring guidelines and medication adherence to achieve therapeutic efficacy achieve control of blood pressure as evidenced by home BP readings adhere to plan to optimize therapeutic regimen for osteoporosis as evidenced by report of adherence to recommended medication management changes through collaboration with PharmD and provider.   Interventions: 1:1 collaboration with [name], NP regarding development and update of comprehensive plan of care as evidenced by provider attestation and co-signature Inter-disciplinary care team collaboration (see longitudinal plan of care) Comprehensive medication review performed; medication list updated in electronic medical record  Hypertension (BP goal <140/90) -Not ideally controlled (running low) -Current treatment: Amlodipine 10 mg 1 tablet daily -Medications previously tried: diltiazem (unknown), HCTZ  -Current home readings: 122/53 (2-3 times a month) -Current dietary habits: patient has been looking at package labels for sodium and is trying to stick with the 1 L recommendation of fluid each day -Current exercise habits: not active -Denies hypotensive/hypertensive symptoms -Educated on Importance of home blood pressure monitoring; Proper BP monitoring technique; Symptoms of hypotension and importance of maintaining adequate hydration; -Counseled to monitor BP at home weekly, document, and provide log at future appointments -Counseled on diet and exercise extensively Recommended to continue current medication Recommended for patient to compare home readings to pharmacy as they are running low.  Hyperlipidemia: (LDL goal < 70) -Controlled -Current treatment: Atorvastatin 20 mg 1 tablet daily -Medications previously tried: simvastatin (unknown)  -Current dietary  patterns: did not discuss -Current exercise habits: no structured exercise -Educated on Cholesterol goals;  Importance of limiting foods high in cholesterol; Exercise goal of 150 minutes per week; -Counseled on diet and exercise extensively Recommended to continue current medication  Pre-diabetes (A1c goal <6.5%) -Controlled -Current medications: No medications -Medications previously tried: none  -Current home glucose readings fasting glucose: does not need to check post prandial glucose: does not need to check -Denies hypoglycemic/hyperglycemic symptoms -Current meal patterns:  breakfast: did not discuss  lunch: did not discuss   dinner: did not discuss  snacks: did not discuss  drinks: did not discuss  -Current exercise: no regular exercise -Educated on A1c and blood sugar goals; Exercise goal of 150 minutes per week; -Counseled to check feet daily and get yearly eye exams -Counseled on diet and exercise extensively Patient has continued to make dietary changes and congratulated her on A1c improvement  Atrial Fibrillation (Goal: prevent stroke and major bleeding) -Controlled -CHADSVASC: 7 -Current treatment: Rhythm control: Amiodarone 200 mg 1 tablet daily Anticoagulation: Eliquis 2.5 mg 1 tablet twice daily -Medications previously tried: n/a -Home BP and HR readings: 76   -Counseled on increased risk of stroke due to Afib and benefits of anticoagulation for stroke prevention; importance of adherence to anticoagulant exactly as prescribed; avoidance of NSAIDs due to increased bleeding risk with anticoagulants; -Recommended to continue current medication Counseled on goal heart rate of < 110 for Afib  Osteoporosis (Goal prevent fractures) -Uncontrolled -Patient reports she has had a DEXA scan in the past but was unable to find record. Patient reports a fracture history. -Last DEXA Scan: n/a   T-Score femoral neck: n/a  T-Score total hip: n/a  T-Score lumbar spine:  n/a  T-Score forearm radius: n/a  10-year probability of major osteoporotic fracture: n/a  10-year probability of hip fracture: n/a -Patient is not a candidate for pharmacologic  treatment -Current treatment  No medications -Medications previously tried: Fosamax (completed 5 years of therapy)  -Recommend [phone] units of vitamin D daily. Counseled on oral bisphosphonate administration: take in the morning, 30 minutes prior to food with 6-8 oz of water. Do not lie down for at least 30 minutes after taking. Recommend weight-bearing and muscle strengthening exercises for building and maintaining bone density. -Recommended for patient to supplement with at least 600 mg of calcium every day and 1000 units of vitamin D based on dietary intake.  Hypothyroidism (Goal: TSH 0.35-4.5) -Controlled -Current treatment  Levothyroxine 25 mcg 1 tablet daily -Medications previously tried: none  -Recommended to continue current medication  Pulmonary hypertension (Goal: minimize swelling) -Not ideally controlled -Current treatment  Furosemide 40 mg 1 tablet daily -Medications previously tried: none  -Recommended to continue current medication Recommended weighing daily to note any differences in weight.   Health Maintenance -Vaccine gaps: shingrix, tetanus -Current therapy:  Tylenol 650 mg used as needed -Educated on Cost vs benefit of each product must be carefully weighed by individual consumer -Patient is satisfied with current therapy and denies issues -Recommended to continue current medication  Patient Goals/Self-Care Activities Patient will:  - take medications as prescribed check blood pressure weekly, document, and provide at future appointments target a minimum of 150 minutes of moderate intensity exercise weekly  Follow Up Plan: Telephone follow up appointment with care management team member scheduled for: 6 months       Medication Assistance: None required.  Patient affirms  current coverage meets needs.  Compliance/Adherence/Medication fill history: Care Gaps: Shingrix, tetanus, DEXA, mammogram, COVID booster  Star-Rating Drugs: Atorvastatin 40 mg - [facility]  [facility]  [id number] [place] [address]Phone: [phone]: [address] [address]Phone: [phone]: [phone] Uses pill box? No - husband sets out medicines for her and keeps track Pt endorses 100% compliance  We discussed: Current pharmacy is preferred with insurance plan and patient is satisfied with pharmacy services Patient decided to: Continue current medication management strategy  Care Plan and Follow Up Patient Decision:  Patient agrees to Care Plan and Follow-up.  Plan: Telephone follow up appointment with care management team member scheduled for:  6 months  MJeni Salles PharmD, BForest CityPharmacist LPort [place]fieldat BFort [place]kn[place] [phone]

## 2020-07-11 DIAGNOSIS — H903 Sensorineural hearing loss, bilateral: Secondary | ICD-10-CM | POA: Diagnosis not present

## 2020-09-09 ENCOUNTER — Other Ambulatory Visit: Payer: Self-pay | Admitting: Cardiology

## 2020-09-10 NOTE — Telephone Encounter (Signed)
Prescription refill request for Eliquis received. Indication: Afib Last office visit: 04/22/20 Stanford Breed) Scr: 1.66 (07/02/20) Age: 87 Weight: 76.7kg  Appropriate dose and refill sent to requested pharmacy.

## 2020-09-12 ENCOUNTER — Telehealth: Payer: Self-pay | Admitting: Pharmacist

## 2020-09-12 NOTE — Chronic Care Management (AMB) (Signed)
Chronic Care Management Pharmacy Assistant   Name: Tammy [name]  MRN: [id number] DOB: 06-Nov-1931  Reason for Encounter: Disease State Hypertension Assessment Call   Conditions to be addressed/monitored: HTN  Recent office visits:  None  Recent consult visits:  None  Hospital visits:  None in previous 6 months  Medications: Outpatient Encounter Medications as of 09/12/2020  Medication Sig   acetaminophen (TYLENOL) 650 MG CR tablet Take 1,300 mg by mouth every 8 (eight) hours as needed for pain.   amiodarone (PACERONE) 200 MG tablet TAKE ONE TABLET TWICE DAILY FOR ONE WEEK THEN DECREASE TO 1 TABLET DAILY   amLODipine (NORVASC) 10 MG tablet Take 1 tablet (10 mg total) by mouth daily.   atorvastatin (LIPITOR) 20 MG tablet Take 1 tablet (20 mg total) by mouth daily at 6 PM.   ELIQUIS 2.5 MG TABS tablet TAKE 1 TABLET BY MOUTH TWICE A DAY   furosemide (LASIX) 40 MG tablet TAKE 1 TABLET BY MOUTH EVERY DAY   levothyroxine (SYNTHROID) 25 MCG tablet Take 1 tablet (25 mcg total) by mouth daily.   No facility-administered encounter medications on file as of 09/12/2020.  Reviewed chart prior to disease state call. Spoke with patient regarding BP  Recent Office Vitals: BP Readings from Last 3 Encounters:  07/02/20 140/60  04/22/20 (!) 142/60  01/08/20 (!) 126/52   Pulse Readings from Last 3 Encounters:  07/02/20 76  04/22/20 68  01/08/20 84    Wt Readings from Last 3 Encounters:  07/02/20 169 lb (76.7 kg)  04/22/20 171 lb (77.6 kg)  01/08/20 170 lb (77.1 kg)     Kidney Function Lab Results  Component Value Date/Time   CREATININE 1.66 (H) 07/02/2020 09:27 AM   CREATININE 1.70 (H) 04/22/2020 11:47 AM   CREATININE 1.10 (H) 11/05/2014 11:51 AM   CREATININE 1.08 04/13/2014 09:02 AM   GFR 27.69 (L) 07/02/2020 09:27 AM   GFRNONAA 29 (L) 01/08/2020 12:24 PM   GFRNONAA 56 (L) 03/30/2014 09:31 AM   GFRAA 33 (L) 01/08/2020 12:24 PM   GFRAA 65 03/30/2014 09:31 AM    BMP Latest  Ref Rng & Units 07/02/2020 04/22/2020 01/08/2020  Glucose 70 - 99 mg/dL 121(H) 173(H) 138(H)  BUN 6 - 23 mg/dL 37(H) 30(H) 32(H)  Creatinine 0.40 - 1.20 mg/dL 1.66(H) 1.70(H) 1.60(H)  BUN/Creat Ratio 12 - 28 - 18 20  Sodium 135 - 145 mEq/L 143 143 143  Potassium 3.5 - 5.1 mEq/L 4.5 4.6 5.0  Chloride 96 - 112 mEq/L 104 104 105  CO2 19 - 32 mEq/L '23 20 21  '$ Calcium 8.4 - 10.5 mg/dL 9.8 9.7 9.8    Current antihypertensive regimen:  Amlodipine 10 mg 1 tablet daily - taking in the morning How often are you checking your Blood Pressure? Patient reports infrequently Current home BP readings: 139/54 & 142/64 In office 06-2020 (140/60 p 76)  04-22-2020 (142/60 p 68) What recent interventions/DTPs have been made by any provider to improve Blood Pressure control since last CPP Visit: Patient reports no changes. Any recent hospitalizations or ED visits since last visit with CPP? No/ Patient reports no Urgent Care visits  What diet changes have been made to improve Blood Pressure Control?  Patient reports her appetite is good for breakfast she will have cereal and banana, For lunch she will have a sandwich. For dinner she reports they eat out on the weekends and during the week will eat something they get together at home. She reports she is  not drinking much water stated one of her doctors advised her not to drink too much. She enjoys drinking her sweet tea. What exercise is being done to improve your Blood Pressure Control?  Patient reports she is using a cane when she is out of the house to get around but when she is at home she gets around well indoors and doe her stretches.  Adherence Review: Is the patient currently on ACE/ARB medication? No Does the patient have >5 day gap between last estimated fill dates? No   Care Gaps: CCM F/U call - Scheduled 01-08-21 at 1 pm AWV last done 2021 - MSG sent to [place] to schedule. Zoster Vaccine - Overdue DEXA - Overdue Mammogram - Overdue COVID  Booster #4(Pfizer) - Overdue TDAP - Overdue Flu Vaccine - Overdue   Star Rating Drugs: Atorvastatin (Lipitor) - [facility]  Notes: Attempted to reach on 09-12-2020 left vm  [place] Pharmacist Assistant [phone]

## 2020-10-01 ENCOUNTER — Telehealth: Payer: Self-pay | Admitting: Adult Health

## 2020-10-01 NOTE — Telephone Encounter (Signed)
error 

## 2020-10-02 ENCOUNTER — Ambulatory Visit (INDEPENDENT_AMBULATORY_CARE_PROVIDER_SITE_OTHER): Payer: Medicare HMO

## 2020-10-02 ENCOUNTER — Other Ambulatory Visit: Payer: Self-pay

## 2020-10-02 DIAGNOSIS — Z0000 Encounter for general adult medical examination without abnormal findings: Secondary | ICD-10-CM

## 2020-10-02 NOTE — Patient Instructions (Signed)
Tammy [name] , Thank you for taking time to come for your Medicare Wellness Visit. I appreciate your ongoing commitment to your health goals. Please review the following plan we discussed and let me know if I can assist you in the future.   Screening recommendations/referrals: Colonoscopy: no longer required Mammogram: No Longer required Bone Density: Done 06/10/06  Recommended yearly ophthalmology/optometry visit for glaucoma screening and checkup Recommended yearly dental visit for hygiene and checkup  Vaccinations: Influenza vaccine: due Pneumococcal vaccine: completed Tdap vaccine: due and discussed Shingles vaccine: Shingrix discussed. Please contact your pharmacy for coverage information.    Covid-19:completed 3/27, 4/17, & 10/27/19  Advanced directives: Advance directive discussed with you today. Even though you declined this today please call our office should you change your mind and we can give you the proper paperwork for you to fill out.  Conditions/risks identified: completed 3/27, 4/17, 10/27/19  Next appointment: Follow up in one year for your annual wellness visit    Preventive Care 65 Years and Older, Female Preventive care refers to lifestyle choices and visits with your health care provider that can promote health and wellness. What does preventive care include? A yearly physical exam. This is also called an annual well check. Dental exams once or twice a year. Routine eye exams. Ask your health care provider how often you should have your eyes checked. Personal lifestyle choices, including: Daily care of your teeth and gums. Regular physical activity. Eating a healthy diet. Avoiding tobacco and drug use. Limiting alcohol use. Practicing safe sex. Taking low-dose aspirin every day. Taking vitamin and mineral supplements as recommended by your health care provider. What happens during an annual well check? The services and screenings done by your health care  provider during your annual well check will depend on your age, overall health, lifestyle risk factors, and family history of disease. Counseling  Your health care provider may ask you questions about your: Alcohol use. Tobacco use. Drug use. Emotional well-being. Home and relationship well-being. Sexual activity. Eating habits. History of falls. Memory and ability to understand (cognition). Work and work Statistician. Reproductive health. Screening  You may have the following tests or measurements: Height, weight, and BMI. Blood pressure. Lipid and cholesterol levels. These may be checked every 5 years, or more frequently if you are over 54 years old. Skin check. Lung cancer screening. You may have this screening every year starting at age 85 if you have a 30-pack-year history of smoking and currently smoke or have quit within the past 15 years. Fecal occult blood test (FOBT) of the stool. You may have this test every year starting at age 85. Flexible sigmoidoscopy or colonoscopy. You may have a sigmoidoscopy every 5 years or a colonoscopy every 10 years starting at age 85. Hepatitis C blood test. Hepatitis B blood test. Sexually transmitted disease (STD) testing. Diabetes screening. This is done by checking your blood sugar (glucose) after you have not eaten for a while (fasting). You may have this done every 1-3 years. Bone density scan. This is done to screen for osteoporosis. You may have this done starting at age 85. Mammogram. This may be done every 1-2 years. Talk to your health care provider about how often you should have regular mammograms. Talk with your health care provider about your test results, treatment options, and if necessary, the need for more tests. Vaccines  Your health care provider may recommend certain vaccines, such as: Influenza vaccine. This is recommended every year. Tetanus, diphtheria, and acellular  pertussis (Tdap, Td) vaccine. You may need a Td  booster every 10 years. Zoster vaccine. You may need this after age 25. Pneumococcal 13-valent conjugate (PCV13) vaccine. One dose is recommended after age 85. Pneumococcal polysaccharide (PPSV23) vaccine. One dose is recommended after age 85. Talk to your health care provider about which screenings and vaccines you need and how often you need them. This information is not intended to replace advice given to you by your health care provider. Make sure you discuss any questions you have with your health care provider. Document Released: 02/08/2015 Document Revised: 10/02/2015 Document Reviewed: 11/13/2014 Elsevier Interactive Patient Education  2017 [place] Prevention in the Home Falls can cause injuries. They can happen to people of all ages. There are many things you can do to make your home safe and to help prevent falls. What can I do on the outside of my home? Regularly fix the edges of walkways and driveways and fix any cracks. Remove anything that might make you trip as you walk through a door, such as a raised step or threshold. Trim any bushes or trees on the path to your home. Use bright outdoor lighting. Clear any walking paths of anything that might make someone trip, such as rocks or tools. Regularly check to see if handrails are loose or broken. Make sure that both sides of any steps have handrails. Any raised decks and porches should have guardrails on the edges. Have any leaves, snow, or ice cleared regularly. Use sand or salt on walking paths during winter. Clean up any spills in your garage right away. This includes oil or grease spills. What can I do in the bathroom? Use night lights. Install grab bars by the toilet and in the tub and shower. Do not use towel bars as grab bars. Use non-skid mats or decals in the tub or shower. If you need to sit down in the shower, use a plastic, non-slip stool. Keep the floor dry. Clean up any water that spills on the floor  as soon as it happens. Remove soap buildup in the tub or shower regularly. Attach bath mats securely with double-sided non-slip rug tape. Do not have throw rugs and other things on the floor that can make you trip. What can I do in the bedroom? Use night lights. Make sure that you have a light by your bed that is easy to reach. Do not use any sheets or blankets that are too big for your bed. They should not hang down onto the floor. Have a firm chair that has side arms. You can use this for support while you get dressed. Do not have throw rugs and other things on the floor that can make you trip. What can I do in the kitchen? Clean up any spills right away. Avoid walking on wet floors. Keep items that you use a lot in easy-to-reach places. If you need to reach something above you, use a strong step stool that has a grab bar. Keep electrical cords out of the way. Do not use floor polish or wax that makes floors slippery. If you must use wax, use non-skid floor wax. Do not have throw rugs and other things on the floor that can make you trip. What can I do with my stairs? Do not leave any items on the stairs. Make sure that there are handrails on both sides of the stairs and use them. Fix handrails that are broken or loose. Make sure that handrails  are as long as the stairways. Check any carpeting to make sure that it is firmly attached to the stairs. Fix any carpet that is loose or worn. Avoid having throw rugs at the top or bottom of the stairs. If you do have throw rugs, attach them to the floor with carpet tape. Make sure that you have a light switch at the top of the stairs and the bottom of the stairs. If you do not have them, ask someone to add them for you. What else can I do to help prevent falls? Wear shoes that: Do not have high heels. Have rubber bottoms. Are comfortable and fit you well. Are closed at the toe. Do not wear sandals. If you use a stepladder: Make sure that it is  fully opened. Do not climb a closed stepladder. Make sure that both sides of the stepladder are locked into place. Ask someone to hold it for you, if possible. Clearly mark and make sure that you can see: Any grab bars or handrails. First and last steps. Where the edge of each step is. Use tools that help you move around (mobility aids) if they are needed. These include: Canes. Walkers. Scooters. Crutches. Turn on the lights when you go into a dark area. Replace any light bulbs as soon as they burn out. Set up your furniture so you have a clear path. Avoid moving your furniture around. If any of your floors are uneven, fix them. If there are any pets around you, be aware of where they are. Review your medicines with your doctor. Some medicines can make you feel dizzy. This can increase your chance of falling. Ask your doctor what other things that you can do to help prevent falls. This information is not intended to replace advice given to you by your health care provider. Make sure you discuss any questions you have with your health care provider. Document Released: 11/08/2008 Document Revised: 06/20/2015 Document Reviewed: 02/16/2014 Elsevier Interactive Patient Education  2017 [employer].

## 2020-10-02 NOTE — Progress Notes (Signed)
Virtual Visit via Telephone Note  I connected with  Tammy [name] on 10/02/20 at  3:15 PM EDT by telephone and verified that I am speaking with the correct person using two identifiers.  Medicare Annual Wellness visit completed telephonically due to Covid-19 pandemic.   Persons participating in this call: This Health Coach and this patient.   Location: Patient: home Provider: office   I discussed the limitations, risks, security and privacy concerns of performing an evaluation and management service by telephone and the availability of in person appointments. The patient expressed understanding and agreed to proceed.  Unable to perform video visit due to video visit attempted and failed and/or patient does not have video capability.   Some vital signs may be absent or patient reported.   Willette Brace, LPN   Subjective:   Tammy [name] is a 87 y.o. female who presents for Medicare Annual (Subsequent) preventive examination.  Review of Systems     Cardiac Risk Factors include: advanced age (>66mn, >>81women);hypertension;dyslipidemia     Objective:    There were no vitals filed for this visit. There is no height or weight on file to calculate BMI.  Advanced Directives 10/02/2020 11/22/2019 10/25/2018 06/07/2018 06/02/2018 12/28/2017 08/28/2017  Does Patient Have a Medical Advance Directive? No No No No No No No  Would patient like information on creating a medical advance directive? No - Patient declined No - Patient declined No - Patient declined No - Patient declined No - Patient declined No - Patient declined -    Current Medications (verified) Outpatient Encounter Medications as of 10/02/2020  Medication Sig   acetaminophen (TYLENOL) 650 MG CR tablet Take 1,300 mg by mouth every 8 (eight) hours as needed for pain.   amiodarone (PACERONE) 200 MG tablet TAKE ONE TABLET TWICE DAILY FOR ONE WEEK THEN DECREASE TO 1 TABLET DAILY   amLODipine (NORVASC) 10 MG tablet Take 1 tablet (10 mg  total) by mouth daily.   atorvastatin (LIPITOR) 20 MG tablet Take 1 tablet (20 mg total) by mouth daily at 6 PM.   ELIQUIS 2.5 MG TABS tablet TAKE 1 TABLET BY MOUTH TWICE A DAY   furosemide (LASIX) 40 MG tablet TAKE 1 TABLET BY MOUTH EVERY DAY   levothyroxine (SYNTHROID) 25 MCG tablet Take 1 tablet (25 mcg total) by mouth daily.   No facility-administered encounter medications on file as of 10/02/2020.    Allergies (verified) Tuberculin tests   History: Past Medical History:  Diagnosis Date   Arthritis    BREAST BIOPSY, HX OF 06/10/2006   Breast cyst    Dysrhythmia    HIP PAIN, LEFT 01/31/2007   HYPERLIPIDEMIA 06/10/2006   HYPERTENSION 06/10/2006   Impaired glucose tolerance    Lower extremity edema    NEOPLASMS UNSPEC NATURE BONE SOFT TISSUE&SKIN 05/31/2008   OSTEOPOROSIS 06/10/2006   Pulmonary embolus ([facility]    Past Surgical History:  Procedure Laterality Date   ABDOMINAL HYSTERECTOMY     APPENDECTOMY     BREAST SURGERY     bx x2   CARDIOVERSION N/A 03/11/2017   Procedure: CARDIOVERSION;  Surgeon: CSanda Klein MD;  [facility]  Service: Cardiovascular;  Laterality: N/A;   CHOLECYSTECTOMY  1980   TRANSFORAMINAL LUMBAR INTERBODY FUSION (TLIF) WITH PEDICLE SCREW FIXATION 1 LEVEL Left 10/27/2018   Procedure: Left Lumbar five-sacral one Transforaminal lumbar interbody fusion;  Surgeon: SErline Levine MD;  [facility]  Service: Neurosurgery;  Laterality: Left;   Family History  Problem  Relation Age of Onset   Heart disease Mother        Pacemaker   Heart disease Father    Asthma Father    Diabetes Father    Cancer Maternal Aunt        breast   Heart disease Sister    Hyperlipidemia Sister    Hypertension Sister    Lung cancer Sister    Social History   Socioeconomic History   Marital status: Married    Spouse name: Not on file   Number of children: 4   Years of education: Not on file   Highest education level: Not on file  Occupational History   Not  on file  Tobacco Use   Smoking status: Never   Smokeless tobacco: Current    Types: Snuff  Vaping Use   Vaping Use: Never used  Substance and Sexual Activity   Alcohol use: No    Alcohol/week: 0.0 standard drinks   Drug use: No   Sexual activity: Not Currently  Other Topics Concern   Not on file  Social History Narrative   She is retired    Worked in [employer] for 20 years    Married for 43 years    4 children - live locally.        Social Determinants of Health   Financial Resource Strain: Low Risk    Difficulty of Paying Living Expenses: Not hard at all  Food Insecurity: No Food Insecurity   Worried About Charity fundraiser in the Last Year: Never true   [place] in the Last Year: Never true  Transportation Needs: No Transportation Needs   Lack of Transportation (Medical): No   Lack of Transportation (Non-Medical): No  Physical Activity: Inactive   Days of Exercise per Week: 0 days   Minutes of Exercise per Session: 0 min  Stress: No Stress Concern Present   Feeling of Stress : Not at all  Social Connections: Moderately Integrated   Frequency of Communication with Friends and Family: More than three times a week   Frequency of Social Gatherings with Friends and Family: More than three times a week   Attends Religious Services: More than 4 times per year   Active Member of [employer] or Organizations: No   Attends Music therapist: Never   Marital Status: Married    Tobacco Counseling Ready to quit: Not Answered Counseling given: Not Answered   Clinical Intake:  Pre-visit preparation completed: Yes  Pain : No/denies pain     BMI - recorded: 26.87 Nutritional Status: BMI 25 -29 Overweight Nutritional Risks: None Diabetes: No  How often do you need to have someone help you when you read instructions, pamphlets, or other written materials from your doctor or pharmacy?: 1 - Never  Diabetic?No  Interpreter Needed?: No  Information  entered by :: Charlott Rakes, LPN   Activities of Daily Living In your present state of health, do you have any difficulty performing the following activities: 10/02/2020 11/22/2019  Hearing? Y Y  Comment - Patient states has difficulties with hearing, has to ask people to repeat themsevles often  Vision? N N  Difficulty concentrating or making decisions? Y Y  Comment at time forget Has difficulty remembering. States does not remember much  Walking or climbing stairs? Tempie Donning  Comment tired walking Patient states has SOB, and leg weakness when climbing stairs  Dressing or bathing? N Y  Comment - Gets tired, and sob when  bathing and dressing has to sit down  Doing errands, shopping? N Y  Comment - can not walk far and does not drive  Preparing Food and eating ? N Y  Comment - Has to sit down several times while preparing a meal before she finishes due to SOB and weakness  Using the Toilet? N N  In the past six months, have you accidently leaked urine? N N  Do you have problems with loss of bowel control? N N  Managing your Medications? N Y  Comment - Husband does medications  Managing your Finances? N N  Housekeeping or managing your Housekeeping? N Y  Comment - only able to do the laundry  Some recent data might be hidden    Patient Care Team: [name], NP as PCP - General (Family Medicine) Stanford Breed Denice Bors, MD as PCP - Cardiology (Cardiology) Viona Gilmore, [facility] as Pharmacist (Pharmacist)  Indicate any recent Medical Services you may have received from other than [facility] providers in the past year (date may be approximate).     Assessment:   This is a routine wellness examination for [facility].  Hearing/Vision screen Hearing Screening - Comments:: Pt wears hearing aids Vision Screening - Comments:: Pt follows up with eye provider for annual eye exams   Dietary issues and exercise activities discussed: Current Exercise Habits: The patient does not participate in regular  exercise at present   Goals Addressed             This Visit's Progress    Patient Stated       None at this time        Depression Screen PHQ 2/9 Scores 10/02/2020 11/22/2019 06/29/2019 03/11/2018 03/10/2017 07/08/2015 07/04/2014  PHQ - 2 Score 0 0 0 0 0 0 0  PHQ- 9 Score - 0 - - - - -    Fall Risk Fall Risk  10/02/2020 11/22/2019 06/29/2019 03/11/2018 03/10/2017  Falls in the past year? 1 0 0 - No  Number falls in past yr: 1 0 - 1 -  Injury with Fall? 1 0 - - -  Comment right foot - - - -  Risk for fall due to : Impaired vision;Impaired balance/gait;Impaired mobility Impaired balance/gait - - -  Follow up Falls prevention discussed Falls evaluation completed;Falls prevention discussed - - -    FALL RISK PREVENTION PERTAINING TO THE HOME:  Any stairs in or around the home? Yes  If so, are there any without handrails? No  Home free of loose throw rugs in walkways, pet beds, electrical cords, etc? Yes  Adequate lighting in your home to reduce risk of falls? Yes   ASSISTIVE DEVICES UTILIZED TO PREVENT FALLS:  Life alert? No  Use of a cane, walker or w/c? Yes  Grab bars in the bathroom? Yes  Shower chair or bench in shower? No  Elevated toilet seat or a handicapped toilet? No   TIMED UP AND GO:  Was the test performed? No .   Cognitive Function:     6CIT Screen 10/02/2020 11/22/2019  What Year? 0 points 0 points  What month? 0 points 0 points  What time? 0 points 0 points  Count back from 20 0 points 0 points  Months in reverse 4 points 4 points  Repeat phrase 2 points 8 points  Total Score 6 12    Immunizations Immunization History  Administered Date(s) Administered   Fluad Quad(high Dose 65+) 12/06/2018   Influenza Split 12/15/2010, 12/14/2011, 10/27/2014  Influenza Whole 10/09/2009   Influenza, High Dose Seasonal PF 12/13/2012, 11/04/2014, 10/28/2017, 11/06/2019   Influenza,inj,Quad PF,6+ Mos 12/18/2013   Influenza-Unspecified 11/26/2015, 10/30/2016    PFIZER(Purple Top)SARS-COV-2 Vaccination 04/22/2019, 05/13/2019, 11/06/2019   Pneumococcal Conjugate-13 05/31/2013   Pneumococcal Polysaccharide-23 06/13/2010   Tdap 06/13/2010    TDAP status: Due, Education has been provided regarding the importance of this vaccine. Advised may receive this vaccine at local pharmacy or Health Dept. Aware to provide a copy of the vaccination record if obtained from local pharmacy or Health Dept. Verbalized acceptance and understanding.  Flu Vaccine status: Due, Education has been provided regarding the importance of this vaccine. Advised may receive this vaccine at local pharmacy or Health Dept. Aware to provide a copy of the vaccination record if obtained from local pharmacy or Health Dept. Verbalized acceptance and understanding.  Pneumococcal vaccine status: Up to date  Covid-19 vaccine status: Completed vaccines  Qualifies for Shingles Vaccine? Yes   Zostavax completed No   Shingrix Completed?: No.    Education has been provided regarding the importance of this vaccine. Patient has been advised to call insurance company to determine out of pocket expense if they have not yet received this vaccine. Advised may also receive vaccine at local pharmacy or Health Dept. Verbalized acceptance and understanding.  Screening Tests Health Maintenance  Topic Date Due   Zoster Vaccines- Shingrix (1 of 2) Never done   COVID-19 Vaccine (4 - Booster for Pfizer series) 02/06/2020   TETANUS/TDAP  06/12/2020   INFLUENZA VACCINE  08/26/2020   MAMMOGRAM  10/02/2021 (Originally 12/30/2016)   DEXA SCAN  10/02/2021 (Originally 08/04/1998)   PNA vac Low Risk Adult  Completed   HPV VACCINES  Aged Out    Health Maintenance  Health Maintenance Due  Topic Date Due   Zoster Vaccines- Shingrix (1 of 2) Never done   COVID-19 Vaccine (4 - Booster for Pfizer series) 02/06/2020   TETANUS/TDAP  06/12/2020   INFLUENZA VACCINE  08/26/2020    Colorectal cancer screening: No longer  required.   Mammogram status: No longer required due to age.  Bone Density status: Completed 06/10/06. Results reflect: Bone density results: OSTEOPOROSIS. Repeat every 2 years.  Additional Screening:   Vision Screening: Recommended annual ophthalmology exams for early detection of glaucoma and other disorders of the eye. Is the patient up to date with their annual eye exam?  Yes  Who is the provider or what is the name of the office in which the patient attends annual eye exams? Unsure of the name lady provider If pt is not established with a provider, would they like to be referred to a provider to establish care? No .   Dental Screening: Recommended annual dental exams for proper oral hygiene  Community Resource Referral / Chronic Care Management: CRR required this visit?  No   CCM required this visit?  No      Plan:     I have personally reviewed and noted the following in the patient's chart:   Medical and social history Use of alcohol, tobacco or illicit drugs  Current medications and supplements including opioid prescriptions.  Functional ability and status Nutritional status Physical activity Advanced directives List of other physicians Hospitalizations, surgeries, and ER visits in previous 12 months Vitals Screenings to include cognitive, depression, and falls Referrals and appointments  In addition, I have reviewed and discussed with patient certain preventive protocols, quality metrics, and best practice recommendations. A written personalized care plan for preventive services as well  as general preventive health recommendations were provided to patient.     Willette Brace, LPN   [id number]   Nurse Notes: None

## 2020-10-18 NOTE — Progress Notes (Signed)
HPI: FU syncope, ASD and atrial fibrillation. Patient with history of syncope felt secondary to autonomic dysfunction. Previously loop monitor considered; Dr Lovena Le felt it would be indicated if recurrent syncope. H/O pulmonary embolus; seen by pulmonary and chronic anticoagulation felt indicated. Holter monitor November 2018 showed atrial fibrillation with PVCs or aberrantly conducted beats rate controlled. Patient previously placed on amiodarone and underwent successful cardioversion to sinus rhythm February 2019. Echocardiogram August 2020 showed normal LV function, severe RV dysfunction, severe right ventricular enlargement, moderate left atrial enlargement, severe right atrial enlargement, severe tricuspid regurgitation, mild aortic insufficiency. Follow-up study September 2020 showed moderate to large secundum atrial septal defect with bidirectional shunting. ABIs May 2021 normal on the right and severe on the left. Peripheral vascular disease followed by Dr. Scot Dock. Echocardiogram repeated December 2021 and showed normal LV function, moderate left ventricular hypertrophy, grade 2 diastolic dysfunction, severe pulmonary hypertension, moderate right atrial enlargement, mild mitral regurgitation, moderate to severe tricuspid regurgitation, mild aortic insufficiency. Lasix increased at last office visit due to worsening dyspnea/pedal edema.  Since last seen, she has dyspnea on exertion.  No orthopnea or PND.  Chronic mild pedal edema.  She denies chest pain or syncope.  Current Outpatient Medications  Medication Sig Dispense Refill   acetaminophen (TYLENOL) 650 MG CR tablet Take 1,300 mg by mouth every 8 (eight) hours as needed for pain.     amiodarone (PACERONE) 200 MG tablet TAKE ONE TABLET TWICE DAILY FOR ONE WEEK THEN DECREASE TO 1 TABLET DAILY 90 tablet 3   amLODipine (NORVASC) 10 MG tablet Take 1 tablet (10 mg total) by mouth daily. 90 tablet 3   atorvastatin (LIPITOR) 20 MG tablet Take 1  tablet (20 mg total) by mouth daily at 6 PM. 90 tablet 3   ELIQUIS 2.5 MG TABS tablet TAKE 1 TABLET BY MOUTH TWICE A DAY 90 tablet 3   furosemide (LASIX) 40 MG tablet TAKE 1 TABLET BY MOUTH EVERY DAY 90 tablet 3   levothyroxine (SYNTHROID) 25 MCG tablet Take 1 tablet (25 mcg total) by mouth daily. 90 tablet 3   No current facility-administered medications for this visit.     Past Medical History:  Diagnosis Date   Arthritis    BREAST BIOPSY, HX OF 06/10/2006   Breast cyst    Dysrhythmia    HIP PAIN, LEFT 01/31/2007   HYPERLIPIDEMIA 06/10/2006   HYPERTENSION 06/10/2006   Impaired glucose tolerance    Lower extremity edema    NEOPLASMS UNSPEC NATURE BONE SOFT TISSUE&SKIN 05/31/2008   OSTEOPOROSIS 06/10/2006   Pulmonary embolus ([place])     Past Surgical History:  Procedure Laterality Date   ABDOMINAL HYSTERECTOMY     APPENDECTOMY     BREAST SURGERY     bx x2   CARDIOVERSION N/A 03/11/2017   Procedure: CARDIOVERSION;  Surgeon: Sanda Klein, MD;  Location: [facility];  Service: Cardiovascular;  Laterality: N/A;   CHOLECYSTECTOMY  1980   TRANSFORAMINAL LUMBAR INTERBODY FUSION (TLIF) WITH PEDICLE SCREW FIXATION 1 LEVEL Left 10/27/2018   Procedure: Left Lumbar five-sacral one Transforaminal lumbar interbody fusion;  Surgeon: Erline Levine, MD;  Location: [facility];  Service: Neurosurgery;  Laterality: Left;    Social History   Socioeconomic History   Marital status: Married    Spouse name: Not on file   Number of children: 4   Years of education: Not on file   Highest education level: Not on file  Occupational History   Not on file  Tobacco  Use   Smoking status: Never   Smokeless tobacco: Current    Types: Snuff  Vaping Use   Vaping Use: Never used  Substance and Sexual Activity   Alcohol use: No    Alcohol/week: 0.0 standard drinks   Drug use: No   Sexual activity: Not Currently  Other Topics Concern   Not on file  Social History Narrative   She is retired    Worked in [employer] for 20 years    Married for 43 years    4 children - live locally.        Social Determinants of Health   Financial Resource Strain: Low Risk    Difficulty of Paying Living Expenses: Not hard at all  Food Insecurity: No Food Insecurity   Worried About Charity fundraiser in the Last Year: Never true   [place] in the Last Year: Never true  Transportation Needs: No Transportation Needs   Lack of Transportation (Medical): No   Lack of Transportation (Non-Medical): No  Physical Activity: Inactive   Days of Exercise per Week: 0 days   Minutes of Exercise per Session: 0 min  Stress: No Stress Concern Present   Feeling of Stress : Not at all  Social Connections: Moderately Integrated   Frequency of Communication with Friends and Family: More than three times a week   Frequency of Social Gatherings with Friends and Family: More than three times a week   Attends Religious Services: More than 4 times per year   Active Member of [employer] or Organizations: No   Attends Music therapist: Never   Marital Status: Married  Human resources officer Violence: Not At Risk   Fear of Current or Ex-Partner: No   Emotionally Abused: No   Physically Abused: No   Sexually Abused: No    Family History  Problem Relation Age of Onset   Heart disease Mother        Pacemaker   Heart disease Father    Asthma Father    Diabetes Father    Cancer Maternal Aunt        breast   Heart disease Sister    Hyperlipidemia Sister    Hypertension Sister    Lung cancer Sister     ROS: no fevers or chills, productive cough, hemoptysis, dysphasia, odynophagia, melena, hematochezia, dysuria, hematuria, rash, seizure activity, orthopnea, PND, claudication. Remaining systems are negative.  Physical Exam: Well-developed well-nourished in no acute distress.  Skin is warm and dry.  HEENT is normal.  Neck is supple.  Chest is clear to auscultation with normal expansion.  Cardiovascular exam  is regular rate and rhythm.  Abdominal exam nontender or distended. No masses palpated. Extremities show trace edema. neuro grossly intact  ECG-normal sinus rhythm with first-degree AV block, right bundle branch block.  Personally reviewed  A/P  1 chronic right-sided heart failure-Continue diuretic at present dose.  She describes dyspnea on exertion that may be slightly worse compared to previous but her pedal edema is unchanged and she is not markedly volume overloaded on exam.  She also has had difficulties with renal insufficiency in the past.  I will check her potassium and renal function today as well as BNP.  If renal function stable and BNP elevated will consider increasing Lasix to 60 mg daily.  2 paroxysmal atrial fibrillation-she remains in sinus rhythm.  We will continue apixaban.  Beta-blockade discontinued previously due to bradycardia.  Will check chest x-ray given amiodarone  use.  Recent TSH and liver functions normal.  3 ASD-as outlined in previous notes patient only wants conservative measures with no intervention.  4 hypertension-blood pressure controlled.  Continue present medical regimen.  5 hyperlipidemia-continue statin.  6 tricuspid regurgitation-as above we will plan conservative measures given patient's age and overall medical condition.  7 peripheral vascular disease-plan medical therapy.  8 chronic stage III kidney disease.  Kirk Ruths, MD

## 2020-10-23 ENCOUNTER — Ambulatory Visit: Payer: Medicare HMO | Admitting: Cardiology

## 2020-10-23 ENCOUNTER — Other Ambulatory Visit: Payer: Self-pay

## 2020-10-23 ENCOUNTER — Ambulatory Visit
Admission: RE | Admit: 2020-10-23 | Discharge: 2020-10-23 | Disposition: A | Discharge status: Home / Self Care | Payer: Medicare HMO | Source: Ambulatory Visit | Attending: Cardiology | Admitting: Cardiology

## 2020-10-23 ENCOUNTER — Encounter: Payer: Self-pay | Admitting: Cardiology

## 2020-10-23 VITALS — BP 150/60 | HR 65 | Ht 67.0 in | Wt 168.8 lb

## 2020-10-23 DIAGNOSIS — N183 Chronic kidney disease, stage 3 unspecified: Secondary | ICD-10-CM | POA: Diagnosis not present

## 2020-10-23 DIAGNOSIS — Q211 Atrial septal defect, unspecified: Secondary | ICD-10-CM

## 2020-10-23 DIAGNOSIS — I50812 Chronic right heart failure: Secondary | ICD-10-CM | POA: Diagnosis not present

## 2020-10-23 DIAGNOSIS — R0602 Shortness of breath: Secondary | ICD-10-CM | POA: Diagnosis not present

## 2020-10-23 DIAGNOSIS — I10 Essential (primary) hypertension: Secondary | ICD-10-CM

## 2020-10-23 DIAGNOSIS — I480 Paroxysmal atrial fibrillation: Secondary | ICD-10-CM | POA: Diagnosis not present

## 2020-10-23 DIAGNOSIS — I079 Rheumatic tricuspid valve disease, unspecified: Secondary | ICD-10-CM | POA: Diagnosis not present

## 2020-10-23 DIAGNOSIS — I130 Hypertensive heart and chronic kidney disease with heart failure and stage 1 through stage 4 chronic kidney disease, or unspecified chronic kidney disease: Secondary | ICD-10-CM | POA: Diagnosis not present

## 2020-10-23 NOTE — Patient Instructions (Signed)
  TESTING:  A chest x-ray takes a picture of the organs and structures inside the chest, including the heart, lungs, and blood vessels. This test can show several things, including, whether the heart is enlarges; whether fluid is building up in the lungs; and whether pacemaker / defibrillator leads are still in place. [id number]  Follow-Up: At [facility], you and your health needs are our priority.  As part of our continuing mission to provide you with exceptional heart care, we have created designated Provider Care Teams.  These Care Teams include your primary Cardiologist (physician) and Advanced Practice Providers (APPs -  Physician Assistants and Nurse Practitioners) who all work together to provide you with the care you need, when you need it.  We recommend signing up for the patient portal called "MyChart".  Sign up information is provided on this After Visit Summary.  MyChart is used to connect with patients for Virtual Visits (Telemedicine).  Patients are able to view lab/test results, encounter notes, upcoming appointments, etc.  Non-urgent messages can be sent to your provider as well.   To learn more about what you can do with MyChart, go to [email or web address].    Your next appointment:   6 month(s)  The format for your next appointment:   In Person  Provider:   Kirk Ruths, MD

## 2020-10-24 LAB — BASIC METABOLIC PANEL
BUN/Creatinine Ratio: 20 (ref 12–28)
BUN: 33 mg/dL — ABNORMAL HIGH (ref 8–27)
CO2: 19 mmol/L — ABNORMAL LOW (ref 20–29)
Calcium: 9.2 mg/dL (ref 8.7–10.3)
Chloride: 107 mmol/L — ABNORMAL HIGH (ref 96–106)
Creatinine, Ser: 1.63 mg/dL — ABNORMAL HIGH (ref 0.57–1.00)
Glucose: 139 mg/dL — ABNORMAL HIGH (ref 70–99)
Potassium: 4.2 mmol/L (ref 3.5–5.2)
Sodium: 147 mmol/L — ABNORMAL HIGH (ref 134–144)
eGFR: 30 mL/min/{1.73_m2} — ABNORMAL LOW (ref >59)

## 2020-10-24 LAB — PRO B NATRIURETIC PEPTIDE: NT-Pro BNP: 889 pg/mL — ABNORMAL HIGH (ref 0–738)

## 2020-10-28 ENCOUNTER — Telehealth: Payer: Self-pay | Admitting: *Deleted

## 2020-10-28 DIAGNOSIS — R9389 Abnormal findings on diagnostic imaging of other specified body structures: Secondary | ICD-10-CM

## 2020-10-28 NOTE — Telephone Encounter (Signed)
-----   Message from Lelon Perla, MD sent at 10/25/2020  7:24 AM EDT ----- High resolution chest CT [facility]

## 2020-10-28 NOTE — Telephone Encounter (Signed)
Spoke with pt, aware of results. Order placed for CT scan.

## 2020-10-31 ENCOUNTER — Telehealth: Payer: Self-pay | Admitting: Pharmacist

## 2020-10-31 NOTE — Chronic Care Management (AMB) (Signed)
Chronic Care Management Pharmacy Assistant   Name: Tammy [name]  MRN: [id number] DOB: 03-Jul-1931  Reason for Encounter: Disease State / Hypertension Assessment Call   Conditions to be addressed/monitored: HTN  Recent office visits:  10-02-2020 Willette Brace, LPN - Patient presented for Medicare Annual Wellness exam. No medication changes.  Recent consult visits:  10-23-2020 Lelon Perla, MD (Cardiology) - Patient presented for Paroxysmal atrial fibrillation and other concerns. No medication changes.  Hospital visits:  None in previous 6 months  Medications: Outpatient Encounter Medications as of 10/31/2020  Medication Sig   acetaminophen (TYLENOL) 650 MG CR tablet Take 1,300 mg by mouth every 8 (eight) hours as needed for pain.   amiodarone (PACERONE) 200 MG tablet TAKE ONE TABLET TWICE DAILY FOR ONE WEEK THEN DECREASE TO 1 TABLET DAILY   amLODipine (NORVASC) 10 MG tablet Take 1 tablet (10 mg total) by mouth daily.   atorvastatin (LIPITOR) 20 MG tablet Take 1 tablet (20 mg total) by mouth daily at 6 PM.   ELIQUIS 2.5 MG TABS tablet TAKE 1 TABLET BY MOUTH TWICE A DAY   furosemide (LASIX) 40 MG tablet TAKE 1 TABLET BY MOUTH EVERY DAY   levothyroxine (SYNTHROID) 25 MCG tablet Take 1 tablet (25 mcg total) by mouth daily.   No facility-administered encounter medications on file as of 10/31/2020.  Reviewed chart prior to disease state call. Spoke with patient regarding BP  Recent Office Vitals: BP Readings from Last 3 Encounters:  10/23/20 (!) 150/60  07/02/20 140/60  04/22/20 (!) 142/60   Pulse Readings from Last 3 Encounters:  10/23/20 65  07/02/20 76  04/22/20 68    Wt Readings from Last 3 Encounters:  10/23/20 168 lb 12.8 oz (76.6 kg)  07/02/20 169 lb (76.7 kg)  04/22/20 171 lb (77.6 kg)     Kidney Function Lab Results  Component Value Date/Time   CREATININE 1.63 (H) 10/23/2020 10:15 AM   CREATININE 1.66 (H) 07/02/2020 09:27 AM   CREATININE 1.10 (H)  11/05/2014 11:51 AM   CREATININE 1.08 04/13/2014 09:02 AM   GFR 27.69 (L) 07/02/2020 09:27 AM   GFRNONAA 29 (L) 01/08/2020 12:24 PM   GFRNONAA 56 (L) 03/30/2014 09:31 AM   GFRAA 33 (L) 01/08/2020 12:24 PM   GFRAA 65 03/30/2014 09:31 AM    BMP Latest Ref Rng & Units 10/23/2020 07/02/2020 04/22/2020  Glucose 70 - 99 mg/dL 139(H) 121(H) 173(H)  BUN 8 - 27 mg/dL 33(H) 37(H) 30(H)  Creatinine 0.57 - 1.00 mg/dL 1.63(H) 1.66(H) 1.70(H)  BUN/Creat Ratio 12 - 28 20 - 18  Sodium 134 - 144 mmol/L 147(H) 143 143  Potassium 3.5 - 5.2 mmol/L 4.2 4.5 4.6  Chloride 96 - 106 mmol/L 107(H) 104 104  CO2 20 - 29 mmol/L 19(L) 23 20  Calcium 8.7 - 10.3 mg/dL 9.2 9.8 9.7    Current antihypertensive regimen:  Amlodipine 10 mg 1 tablet daily - taking in the morning How often are you checking your Blood Pressure? Patient reports she is checking her pressures every now and again. Current home BP readings: Her lowest number recently has been 129/53 and on the higher end 140/56 she reports she has not been having any headaches or dizziness or anything like that. What recent interventions/DTPs have been made by any provider to improve Blood Pressure control since last CPP Visit: Patient reports no changes, just had AWV a few weeks ago. Any recent hospitalizations or ED visits since last visit with CPP? No What  diet changes have been made to improve Blood Pressure Control?  Patient reports her meal patterns are unchanged still having cereal and banana, Sandwich for lunch and occassionally goes out with husband for dinner. What exercise is being done to improve your Blood Pressure Control?  Patient reports she is still getting around indoors ok does her stretches and uses her cane when she goes out.  Adherence Review: Is the patient currently on ACE/ARB medication? No Does the patient have >5 day gap between last estimated fill dates? No  Care Gaps: Zoster Vaccine - Overdue COVID Booster #4 Therapist, music) -  Overdue TDAP - Overdue Flu Vaccine - Overdue BP- 150/60 AWV - 10-02-20 CCM - 01-08-21  Star Rating Drugs: Atorvastatin (Lipitor) 20 mg - Last [facility]-07-2020 [facility] date on hold for over 10 min unable to verify  [place] Pharmacist Assistant [phone]

## 2020-11-14 ENCOUNTER — Ambulatory Visit
Admission: RE | Admit: 2020-11-14 | Discharge: 2020-11-14 | Disposition: A | Discharge status: Home / Self Care | Payer: Medicare HMO | Source: Ambulatory Visit | Attending: Cardiology | Admitting: Cardiology

## 2020-11-14 DIAGNOSIS — R911 Solitary pulmonary nodule: Secondary | ICD-10-CM | POA: Diagnosis not present

## 2020-11-14 DIAGNOSIS — I700 Atherosclerosis of aorta: Secondary | ICD-10-CM | POA: Diagnosis not present

## 2020-11-14 DIAGNOSIS — R9389 Abnormal findings on diagnostic imaging of other specified body structures: Secondary | ICD-10-CM

## 2020-11-18 ENCOUNTER — Telehealth: Payer: Self-pay | Admitting: *Deleted

## 2020-11-18 DIAGNOSIS — E041 Nontoxic single thyroid nodule: Secondary | ICD-10-CM

## 2020-11-18 NOTE — Telephone Encounter (Signed)
-----   Message from Lelon Perla, MD sent at 11/17/2020  4:35 PM EDT ----- Arrange thyroid ultrasound Kirk Ruths

## 2020-11-18 NOTE — Telephone Encounter (Signed)
pt aware of results  Order placed for thyroid US.

## 2020-11-20 ENCOUNTER — Other Ambulatory Visit: Payer: Self-pay

## 2020-11-20 ENCOUNTER — Ambulatory Visit
Admission: RE | Admit: 2020-11-20 | Discharge: 2020-11-20 | Disposition: A | Discharge status: Home / Self Care | Payer: Medicare HMO | Source: Ambulatory Visit | Attending: Cardiology | Admitting: Cardiology

## 2020-11-20 DIAGNOSIS — E041 Nontoxic single thyroid nodule: Secondary | ICD-10-CM

## 2020-11-21 ENCOUNTER — Telehealth: Payer: Self-pay | Admitting: *Deleted

## 2020-11-21 DIAGNOSIS — E041 Nontoxic single thyroid nodule: Secondary | ICD-10-CM

## 2020-11-21 NOTE — Telephone Encounter (Signed)
Spoke with pt, she is going to think about having the biopsy, copy of report mailed to patient at her request. They will review and call me back to let me know if she would like to schedule.

## 2020-11-21 NOTE — Telephone Encounter (Signed)
-----   Message from Lelon Perla, MD sent at 11/20/2020  2:24 PM EDT ----- Arrange thyroid biopsy with interventional radiology; hold apixaban 2 days prior to procedure and resume the following day Kirk Ruths

## 2020-11-21 NOTE — Telephone Encounter (Signed)
Left message for pt to call.

## 2020-11-22 NOTE — Addendum Note (Signed)
Addended by: Cristopher Estimable on: 11/22/2020 11:22 AM   Modules accepted: Orders

## 2020-11-22 NOTE — Telephone Encounter (Signed)
Patient is following up, regarding biopsy. States she will discuss further when she speaks with Hilda Blades, Therapist, sports.

## 2020-11-22 NOTE — Telephone Encounter (Signed)
Spoke with pt, she does want to go ahead with the biopsy. Order placed

## 2020-11-27 ENCOUNTER — Other Ambulatory Visit (HOSPITAL_COMMUNITY)
Admission: RE | Admit: 2020-11-27 | Discharge: 2020-11-27 | Disposition: A | Discharge status: Home / Self Care | Payer: Medicare HMO | Source: Ambulatory Visit | Attending: Interventional Radiology | Admitting: Interventional Radiology

## 2020-11-27 ENCOUNTER — Ambulatory Visit
Admission: RE | Admit: 2020-11-27 | Discharge: 2020-11-27 | Disposition: A | Discharge status: Home / Self Care | Payer: Medicare HMO | Source: Ambulatory Visit | Attending: Cardiology | Admitting: Cardiology

## 2020-11-27 DIAGNOSIS — E041 Nontoxic single thyroid nodule: Secondary | ICD-10-CM | POA: Diagnosis not present

## 2020-11-29 LAB — CYTOLOGY - NON PAP

## 2020-12-25 ENCOUNTER — Other Ambulatory Visit: Payer: Self-pay | Admitting: Cardiology

## 2020-12-25 DIAGNOSIS — N289 Disorder of kidney and ureter, unspecified: Secondary | ICD-10-CM

## 2021-01-07 ENCOUNTER — Telehealth: Payer: Self-pay | Admitting: Pharmacist

## 2021-01-07 NOTE — Chronic Care Management (AMB) (Signed)
    Chronic Care Management Pharmacy Assistant   Name: Tammy [name]  MRN: [id number] DOB: [birth year]  01/07/21 APPOINTMENT REMINDER   Patient was reminded to have all medications, supplements and any blood glucose and blood pressure readings available for review with Jeni Salles, Pharm. D, for telephone visit on 1 pm at 01/08/21.    Care Gaps: Zoster Vaccine - Overdue COVID Booster #4 Therapist, music) - Overdue TDAP - Overdue Flu Vaccine - Overdue BP- 150/60 (10/23/20) AWV - 9/22 CCM - 12/22  Star Rating Drug:  Atorvastatin (Lipitor) 20 mg - Last filled [facility]/27/2020 [facility]  Any gaps in medications fill history?  None    Medications: Outpatient Encounter Medications as of 01/07/2021  Medication Sig   acetaminophen (TYLENOL) 650 MG CR tablet Take 1,300 mg by mouth every 8 (eight) hours as needed for pain.   amiodarone (PACERONE) 200 MG tablet TAKE ONE TABLET TWICE DAILY FOR ONE WEEK THEN DECREASE TO 1 TABLET DAILY   amLODipine (NORVASC) 10 MG tablet Take 1 tablet (10 mg total) by mouth daily.   atorvastatin (LIPITOR) 20 MG tablet Take 1 tablet (20 mg total) by mouth daily at 6 PM.   ELIQUIS 2.5 MG TABS tablet TAKE 1 TABLET BY MOUTH TWICE A DAY   furosemide (LASIX) 40 MG tablet TAKE 1 TABLET BY MOUTH EVERY DAY   levothyroxine (SYNTHROID) 25 MCG tablet Take 1 tablet (25 mcg total) by mouth daily.   No facility-administered encounter medications on file as of 01/07/2021.      [place] Clinical Pharmacist Assistant [phone]

## 2021-01-08 ENCOUNTER — Ambulatory Visit (INDEPENDENT_AMBULATORY_CARE_PROVIDER_SITE_OTHER): Payer: Medicare HMO | Admitting: Pharmacist

## 2021-01-08 VITALS — BP 129/53

## 2021-01-08 DIAGNOSIS — I480 Paroxysmal atrial fibrillation: Secondary | ICD-10-CM

## 2021-01-08 DIAGNOSIS — I10 Essential (primary) hypertension: Secondary | ICD-10-CM

## 2021-01-08 NOTE — Progress Notes (Signed)
Chronic Care Management Pharmacy Note  01/08/2021 Name:  Tammy [name] MRN:  [id number] DOB:  21-Nov-1931  Summary: BP is low at home  Recommendations/Changes made from today's visit: -Recommended to bring BP cuff to next office visit to ensure accuracy -Recommended checking vitamin D level and consider supplementation based on her level  Plan: Follow up BP assessment in 3-4 months   Subjective: Tammy [name] is an 87 y.o. year old female who is a primary patient of [name], NP.  The CCM team was consulted for assistance with disease management and care coordination needs.    Engaged with patient by telephone for follow up visit in response to provider referral for pharmacy case management and/or care coordination services.   Consent to Services:  The patient was given information about Chronic Care Management services, agreed to services, and gave verbal consent prior to initiation of services.  Please see initial visit note for detailed documentation.   Patient Care Team: [name], NP as PCP - General (Family Medicine) Stanford Breed Denice Bors, MD as PCP - Cardiology (Cardiology) Viona Gilmore, [facility] as Pharmacist (Pharmacist)  Recent office visits: 10-02-2020 Willette Brace, LPN - Patient presented for Medicare Annual Wellness exam. No medication changes.  07/02/20 [name], NP: Patient presented for annual exam. No medication changes.  Recent consult visits: 10/23/20 Kirk Ruths, MD (cardiology): Patient presented for Afib follow up. No medication changes.  Hospital visits: None in previous 6 months   Objective:  Lab Results  Component Value Date   CREATININE 1.63 (H) 10/23/2020   BUN 33 (H) 10/23/2020   GFR 27.69 (L) 07/02/2020   GFRNONAA 29 (L) 01/08/2020   GFRAA 33 (L) 01/08/2020   NA 147 (H) 10/23/2020   K 4.2 10/23/2020   CALCIUM 9.2 10/23/2020   CO2 19 (L) 10/23/2020   GLUCOSE 139 (H) 10/23/2020    Lab Results  Component Value Date/Time    HGBA1C 5.9 (A) 01/02/2020 09:30 AM   HGBA1C 6.5 06/29/2019 08:21 AM   HGBA1C 6.3 (H) 04/02/2012 04:20 PM   GFR 27.69 (L) 07/02/2020 09:27 AM   GFR 25.69 (L) 06/29/2019 08:21 AM    Last diabetic Eye exam: No results found for: HMDIABEYEEXA  Last diabetic Foot exam: No results found for: HMDIABFOOTEX   Lab Results  Component Value Date   CHOL 139 07/02/2020   HDL 64.20 07/02/2020   LDLCALC 57 07/02/2020   TRIG 88.0 07/02/2020   CHOLHDL 2 07/02/2020    Hepatic Function Latest Ref Rng & Units 07/02/2020 10/30/2019 06/29/2019  Total Protein 6.0 - 8.3 g/dL 7.5 7.3 7.4  Albumin 3.5 - 5.2 g/dL 4.3 4.1 4.4  AST 0 - 37 U/L 27 36 34  ALT 0 - 35 U/L 23 30 32  Alk Phosphatase 39 - 117 U/L 105 103 102  Total Bilirubin 0.2 - 1.2 mg/dL 0.7 0.6 0.8  Bilirubin, Direct 0.00 - 0.40 mg/dL - 0.20 -    Lab Results  Component Value Date/Time   TSH 2.30 07/02/2020 09:27 AM   TSH 2.620 10/30/2019 11:33 AM    CBC Latest Ref Rng & Units 07/02/2020 12/18/2019 06/29/2019  WBC 4.0 - 10.5 K/uL 8.4 7.1 7.8  Hemoglobin 12.0 - 15.0 g/dL 13.2 13.3 14.1  Hematocrit 36.0 - 46.0 % 38.9 39.3 42.3  Platelets 150.0 - 400.0 K/uL 300.0 250 259.0    No results found for: VD25OH  Clinical ASCVD: No  The ASCVD Risk score (Arnett DK, et al., 2019) failed to  calculate for the following reasons:   The 2019 ASCVD risk score is only valid for ages 65 to 80    Depression screen PHQ 2/9 10/02/2020 11/22/2019 06/29/2019  Decreased Interest 0 0 0  Down, Depressed, Hopeless 0 0 0  PHQ - 2 Score 0 0 0  Altered sleeping - 0 -  Tired, decreased energy - 0 -  Change in appetite - 0 -  Feeling bad or failure about yourself  - 0 -  Trouble concentrating - 0 -  Moving slowly or fidgety/restless - 0 -  Suicidal thoughts - 0 -  PHQ-9 Score - 0 -  Difficult doing work/chores - Not difficult at all -  Some recent data might be hidden     CHA2DS2/VAS Stroke Risk Points  Current as of 11 days ago     6 >= 2 Points: High Risk  1 -  1.99 Points: Medium Risk  0 Points: Low Risk    Last Change: N/A      Details    This score determines the patient's risk of having a stroke if the  patient has atrial fibrillation.       Points Metrics  0 Has Congestive Heart Failure:  No    Current as of 11 days ago  0 Has Vascular Disease:  No    Current as of 11 days ago  1 Has Hypertension:  Yes    Current as of 11 days ago  2 Age:  77    Current as of 11 days ago  0 Has Diabetes:  No    Current as of 11 days ago  2 Had Stroke:  No  Had TIA:  No  Had Thromboembolism:  Yes     Current as of 11 days ago  1 Female:  Yes    Current as of 11 days ago     Social History   Tobacco Use  Smoking Status Never  Smokeless Tobacco Current   Types: Snuff   BP Readings from Last 3 Encounters:  10/23/20 (!) 150/60  07/02/20 140/60  04/22/20 (!) 142/60   Pulse Readings from Last 3 Encounters:  10/23/20 65  07/02/20 76  04/22/20 68   Wt Readings from Last 3 Encounters:  10/23/20 168 lb 12.8 oz (76.6 kg)  07/02/20 169 lb (76.7 kg)  04/22/20 171 lb (77.6 kg)   BMI Readings from Last 3 Encounters:  10/23/20 26.44 kg/m  07/02/20 26.87 kg/m  04/22/20 26.78 kg/m    Assessment/Interventions: Review of patient past medical history, allergies, medications, health status, including review of consultants reports, laboratory and other test data, was performed as part of comprehensive evaluation and provision of chronic care management services.   SDOH:  (Social Determinants of Health) assessments and interventions performed: No  SDOH Screenings   Alcohol Screen: Not on file  Depression (PHQ2-9): Low Risk    PHQ-2 Score: 0  Financial Resource Strain: Low Risk    Difficulty of Paying Living Expenses: Not hard at all  Food Insecurity: No Food Insecurity   Worried About Charity fundraiser in the Last Year: Never true   Ran Out of Food in the Last Year: Never true  Housing: Low Risk    Last Housing Risk Score: 0  Physical  Activity: Inactive   Days of Exercise per Week: 0 days   Minutes of Exercise per Session: 0 min  Social Connections: Moderately Integrated   Frequency of Communication with Friends and Family: More than three  times a week   Frequency of Social Gatherings with Friends and Family: More than three times a week   Attends Religious Services: More than 4 times per year   Active Member of [employer] or Organizations: No   Attends Music therapist: Never   Marital Status: Married  Stress: No Stress Concern Present   Feeling of Stress : Not at all  Tobacco Use: High Risk   Smoking Tobacco Use: Never   Smokeless Tobacco Use: Current   Passive Exposure: Not on file  Transportation Needs: No Transportation Needs   Lack of Transportation (Medical): No   Lack of Transportation (Non-Medical): No    CCM Care Plan  Allergies  Allergen Reactions   Tuberculin Tests Other (See Comments)    Tuberculosis shot, red spot on arm    Medications Reviewed Today     Reviewed by Lelon Perla, MD (Physician) on 10/23/20 at 0941  Med List Status: <None>   Medication Order Taking? Sig Documenting Provider Last Dose Status Informant  acetaminophen (TYLENOL) 650 MG CR tablet [id number] Yes Take 1,300 mg by mouth every 8 (eight) hours as needed for pain. [provider] Taking Active Spouse/Significant Other  amiodarone (PACERONE) 200 MG tablet [id number] Yes TAKE ONE TABLET TWICE DAILY FOR ONE WEEK THEN DECREASE TO 1 TABLET DAILY Crenshaw, Denice Bors, MD Taking Active   amLODipine (NORVASC) 10 MG tablet [id number] Yes Take 1 tablet (10 mg total) by mouth daily. Nafziger, Tommi Rumps, NP Taking Active   atorvastatin (LIPITOR) 20 MG tablet [id number] Yes Take 1 tablet (20 mg total) by mouth daily at 6 PM. [name], NP Taking Active   ELIQUIS 2.5 MG TABS tablet [id number] Yes TAKE 1 TABLET BY MOUTH TWICE A DAY Stanford Breed Denice Bors, MD Taking Active   furosemide (LASIX) 40 MG tablet [id number] Yes TAKE 1  TABLET BY MOUTH EVERY DAY Crenshaw, Denice Bors, MD Taking Active   levothyroxine (SYNTHROID) 25 MCG tablet [id number] Yes Take 1 tablet (25 mcg total) by mouth daily. [name], NP Taking Active             Patient Active Problem List   Diagnosis Date Noted   Spondylolisthesis of lumbar region 10/27/2018   Atrial fibrillation ([place]) 03/21/2014   Embolism, pulmonary with infarction ([place]) 03/08/2014   Severe tricuspid regurgitation by prior echocardiogram 02/23/2014   Pulmonary hypertension ([place]) 02/23/2014   Syncope and collapse 02/23/2014   Pain in limb 06/07/2013   PAD (peripheral artery disease) ([place]) 06/01/2013   Syncope 04/02/2012   NEOPLASMS UNSPEC NATURE BONE SOFT TISSUE&SKIN 05/31/2008   Dyslipidemia 06/10/2006   Essential hypertension 06/10/2006   Osteoporosis 06/10/2006   BREAST BIOPSY, HX OF 06/10/2006    Immunization History  Administered Date(s) Administered   Fluad Quad(high Dose 65+) 12/06/2018   Influenza Split 12/15/2010, 12/14/2011, 10/27/2014   Influenza Whole 10/09/2009   Influenza, High Dose Seasonal PF 12/13/2012, 11/04/2014, 10/28/2017, 11/06/2019, 11/08/2020   Influenza,inj,Quad PF,6+ Mos 12/18/2013   Influenza-Unspecified 11/26/2015, 10/30/2016   PFIZER(Purple Top)SARS-COV-2 Vaccination 04/22/2019, 05/13/2019, 11/06/2019   Pfizer Covid-19 Vaccine Bivalent Booster 71yr & up 12/11/2020   Pneumococcal Conjugate-13 05/31/2013   Pneumococcal Polysaccharide-23 06/13/2010   Tdap 06/13/2010   Patient plans to go the back doctor in January. She does report she staggers often and hangs onto people when she is walking and uses a cane. She never goes out alone.   Conditions to be addressed/monitored:  Hypertension, Hyperlipidemia, Atrial Fibrillation, Hypothyroidism, Osteoporosis, and Prediabetes and Pulmonary hypertension  Conditions addressed  this visit: Hypertension, Afib, osteoporosis  Care Plan : [place]  Updates made by Viona Gilmore, [place] since 01/08/2021 12:00 AM     Problem: Problem: Hypertension, Hyperlipidemia, Atrial Fibrillation, Hypothyroidism, Osteoporosis, and Prediabetes and Pulmonary hypertension      Long-Range Goal: Patient-Specific Goal   Start Date: 07/10/2020  Expected End Date: 07/10/2021  Recent Progress: On track  Priority: High  Note:   Current Barriers:  Unable to independently monitor therapeutic efficacy Suboptimal therapeutic regimen for osteoporosis  Pharmacist Clinical Goal(s):  Patient will achieve adherence to monitoring guidelines and medication adherence to achieve therapeutic efficacy achieve control of blood pressure as evidenced by home BP readings adhere to plan to optimize therapeutic regimen for osteoporosis as evidenced by report of adherence to recommended medication management changes through collaboration with PharmD and provider.   Interventions: 1:1 collaboration with [name], NP regarding development and update of comprehensive plan of care as evidenced by provider attestation and co-signature Inter-disciplinary care team collaboration (see longitudinal plan of care) Comprehensive medication review performed; medication list updated in electronic medical record  Hypertension (BP goal <140/90) -Not ideally controlled (running low) -Current treatment: Amlodipine 10 mg 1 tablet daily -Medications previously tried: diltiazem (unknown), HCTZ  -Current home readings: 140/56 (highest), 129/53 (lowest)  -Current dietary habits: patient has been looking at package labels for sodium and is trying to stick with the 1 L recommendation of fluid each day -Current exercise habits: not active -Denies hypotensive/hypertensive symptoms -Educated on Importance of home blood pressure monitoring; Proper BP monitoring technique; Symptoms of hypotension and importance of maintaining adequate hydration; -Counseled to monitor BP at home weekly, document, and provide log at  future appointments -Counseled on diet and exercise extensively Recommended to continue current medication  Hyperlipidemia: (LDL goal < 70) -Controlled -Current treatment: Atorvastatin 20 mg 1 tablet daily -Medications previously tried: simvastatin (unknown)  -Current dietary patterns: did not discuss -Current exercise habits: no structured exercise -Educated on Cholesterol goals;  Importance of limiting foods high in cholesterol; Exercise goal of 150 minutes per week; -Counseled on diet and exercise extensively Recommended to continue current medication  Pre-diabetes (A1c goal <6.5%) -Controlled -Current medications: No medications -Medications previously tried: none  -Current home glucose readings fasting glucose: does not need to check post prandial glucose: does not need to check -Denies hypoglycemic/hyperglycemic symptoms -Current meal patterns:  breakfast: did not discuss  lunch: did not discuss   dinner: did not discuss  snacks: did not discuss  drinks: did not discuss  -Current exercise: no regular exercise -Educated on A1c and blood sugar goals; Exercise goal of 150 minutes per week; -Counseled to check feet daily and get yearly eye exams -Counseled on diet and exercise extensively Patient has continued to make dietary changes and congratulated her on A1c improvement  Atrial Fibrillation (Goal: prevent stroke and major bleeding) -Controlled -CHADSVASC: 7 -Current treatment: Rhythm control: Amiodarone 200 mg 1 tablet daily Anticoagulation: Eliquis 2.5 mg 1 tablet twice daily -Medications previously tried: n/a -Home BP and HR readings: 76   -Counseled on increased risk of stroke due to Afib and benefits of anticoagulation for stroke prevention; importance of adherence to anticoagulant exactly as prescribed; avoidance of NSAIDs due to increased bleeding risk with anticoagulants; -Recommended to continue current medication Counseled on goal heart rate of < 110  for Afib  Osteoporosis (Goal prevent fractures) -Uncontrolled -Patient reports she has had a DEXA scan in the past but was unable to find record. Patient reports a fracture  history. -Last DEXA Scan: n/a   T-Score femoral neck: n/a  T-Score total hip: n/a  T-Score lumbar spine: n/a  T-Score forearm radius: n/a  10-year probability of major osteoporotic fracture: n/a  10-year probability of hip fracture: n/a -Patient is not a candidate for pharmacologic treatment -Current treatment  No medications  -Medications previously tried: Fosamax (completed 5 years of therapy)  -Recommend [phone] units of vitamin D daily. Counseled on oral bisphosphonate administration: take in the morning, 30 minutes prior to food with 6-8 oz of water. Do not lie down for at least 30 minutes after taking. Recommend weight-bearing and muscle strengthening exercises for building and maintaining bone density. -Recommended to continue current medication Recommended repeat vitamin D level and consider supplementation based on level.  Hypothyroidism (Goal: TSH 0.35-4.5) -Controlled -Current treatment  Levothyroxine 25 mcg 1 tablet daily -Medications previously tried: none  -Recommended to continue current medication  Pulmonary hypertension (Goal: minimize swelling) -Not ideally controlled -Current treatment  Furosemide 40 mg 1 tablet daily -Medications previously tried: none  -Recommended to continue current medication Recommended weighing daily to note any differences in weight.  Health Maintenance -Vaccine gaps: shingrix, tetanus -Current therapy:  Tylenol 650 mg used as needed -Educated on Cost vs benefit of each product must be carefully weighed by individual consumer -Patient is satisfied with current therapy and denies issues -Recommended to continue current medication  Patient Goals/Self-Care Activities Patient will:  - take medications as prescribed check blood pressure weekly, document, and  provide at future appointments target a minimum of 150 minutes of moderate intensity exercise weekly  Follow Up Plan: Telephone follow up appointment with care management team member scheduled for: 1 year       Medication Assistance: None required.  Patient affirms current coverage meets needs.  Compliance/Adherence/Medication fill history: Care Gaps: Shingrix, tetanus BP- 150/60 (10/23/20)  Star-Rating Drugs: Atorvastatin (Lipitor) 20 mg - Last [facility]/2020 [facility]  Patient's preferred pharmacy is:  [id number] [place] [address] [place]Phone: [phone]: [address]Phone: [phone]: [phone] Uses pill box? No - husband sets out medicines for her and keeps track Pt endorses 100% compliance  We discussed: Current pharmacy is preferred with insurance plan and patient is satisfied with pharmacy services Patient decided to: Continue current medication management strategy  Care Plan and Follow Up Patient Decision:  Patient agrees to Care Plan and Follow-up.  Plan: Telephone follow up appointment with care management team member scheduled for:  1 year  MJeni Salles PharmD, BLaCostePharmacist LOccidental Petroleumat [phone]

## 2021-01-25 DIAGNOSIS — I480 Paroxysmal atrial fibrillation: Secondary | ICD-10-CM | POA: Diagnosis not present

## 2021-01-25 DIAGNOSIS — I10 Essential (primary) hypertension: Secondary | ICD-10-CM

## 2021-04-04 ENCOUNTER — Telehealth: Payer: Self-pay | Admitting: Pharmacist

## 2021-04-04 NOTE — Chronic Care Management (AMB) (Signed)
? ? ?Chronic Care Management ?Pharmacy Assistant  ? ?Name: Tammy [name]  MRN: [id number] DOB: July 31, 1931 ? ?Reason for Encounter: Disease State Hypertension Assessment  ?  ?Conditions to be addressed/monitored: ?HTN ? ?Recent office visits:  ?None ? ?Recent consult visits:  ?None ? ?Hospital visits:  ?None in previous 6 months ? ?Medications: ?Outpatient Encounter Medications as of 04/04/2021  ?Medication Sig  ? acetaminophen (TYLENOL) 650 MG CR tablet Take 1,300 mg by mouth every 8 (eight) hours as needed for pain.  ? amiodarone (PACERONE) 200 MG tablet TAKE ONE TABLET TWICE DAILY FOR ONE WEEK THEN DECREASE TO 1 TABLET DAILY  ? amLODipine (NORVASC) 10 MG tablet Take 1 tablet (10 mg total) by mouth daily.  ? atorvastatin (LIPITOR) 20 MG tablet Take 1 tablet (20 mg total) by mouth daily at 6 PM.  ? ELIQUIS 2.5 MG TABS tablet TAKE 1 TABLET BY MOUTH TWICE A DAY  ? furosemide (LASIX) 40 MG tablet TAKE 1 TABLET BY MOUTH EVERY DAY  ? levothyroxine (SYNTHROID) 25 MCG tablet Take 1 tablet (25 mcg total) by mouth daily.  ? ?No facility-administered encounter medications on file as of 04/04/2021.  ?Reviewed chart prior to disease state call. Spoke with patient regarding BP ? ?Recent Office Vitals: ?BP Readings from Last 3 Encounters:  ?01/08/21 (!) 129/53  ?10/23/20 (!) 150/60  ?07/02/20 140/60  ? ?Pulse Readings from Last 3 Encounters:  ?10/23/20 65  ?07/02/20 76  ?04/22/20 68  ?  ?Wt Readings from Last 3 Encounters:  ?10/23/20 168 lb 12.8 oz (76.6 kg)  ?07/02/20 169 lb (76.7 kg)  ?04/22/20 171 lb (77.6 kg)  ?  ? ?Kidney Function ?Lab Results  ?Component Value Date/Time  ? CREATININE 1.63 (H) 10/23/2020 10:15 AM  ? CREATININE 1.66 (H) 07/02/2020 09:27 AM  ? CREATININE 1.10 (H) 11/05/2014 11:51 AM  ? CREATININE 1.08 04/13/2014 09:02 AM  ? GFR 27.69 (L) 07/02/2020 09:27 AM  ? GFRNONAA 29 (L) 01/08/2020 12:24 PM  ? GFRNONAA 56 (L) 03/30/2014 09:31 AM  ? GFRAA 33 (L) 01/08/2020 12:24 PM  ? GFRAA 65 03/30/2014 09:31 AM  ? ? ?BMP  Latest Ref Rng & Units 10/23/2020 07/02/2020 04/22/2020  ?Glucose 70 - 99 mg/dL 139(H) 121(H) 173(H)  ?BUN 8 - 27 mg/dL 33(H) 37(H) 30(H)  ?Creatinine 0.57 - 1.00 mg/dL 1.63(H) 1.66(H) 1.70(H)  ?BUN/Creat Ratio 12 - 28 20 - 18  ?Sodium 134 - 144 mmol/L 147(H) 143 143  ?Potassium 3.5 - 5.2 mmol/L 4.2 4.5 4.6  ?Chloride 96 - 106 mmol/L 107(H) 104 104  ?CO2 20 - 29 mmol/L 19(L) 23 20  ?Calcium 8.7 - 10.3 mg/dL 9.2 9.8 9.7  ? ? ?Current antihypertensive regimen:  ?Amlodipine 10 mg 1 tablet daily ?How often are you checking your Blood Pressure? weekly ?Current home BP readings: 140/56 , 120/52 ?What recent interventions/DTPs have been made by any provider to improve Blood Pressure control since last CPP Visit: Patient reports none ?Any recent hospitalizations or ED visits since last visit with CPP? No ?What diet changes have been made to improve Blood Pressure Control?  ?Patient repots she has been eating good ?What exercise is being done to improve your Blood Pressure Control?  ?Patient reports she has back pains and is walking but it hurts her when she is walking too far. ? ?Adherence Review: ?Is the patient currently on ACE/ARB medication? No ?Does the patient have >5 day gap between last estimated fill dates? No ? ? ? ? ?Care Gaps: ?BP-120/52 ( 04/04/21  home) ?AWV- 9/22 ?CCM- 12/23 ? ?Star Rating Drugs: ?None ? ? ?Ned Clines CMA ?Clinical Pharmacist Assistant ?[phone] ? ?

## 2021-04-23 ENCOUNTER — Ambulatory Visit: Payer: Medicare HMO | Admitting: Cardiology

## 2021-04-25 ENCOUNTER — Other Ambulatory Visit: Payer: Self-pay | Admitting: Cardiology

## 2021-04-25 ENCOUNTER — Other Ambulatory Visit: Payer: Self-pay | Admitting: Adult Health

## 2021-04-25 DIAGNOSIS — E785 Hyperlipidemia, unspecified: Secondary | ICD-10-CM

## 2021-05-12 NOTE — Progress Notes (Signed)
? ? ? ? ?HPI:FU syncope, ASD and atrial fibrillation. Patient with history of syncope felt secondary to autonomic dysfunction. Previously loop monitor considered; Dr Lovena Le felt it would be indicated if recurrent syncope. H/O pulmonary embolus; seen by pulmonary and chronic anticoagulation felt indicated. Holter monitor November 2018 showed atrial fibrillation with PVCs or aberrantly conducted beats rate controlled. Patient previously placed on amiodarone and underwent successful cardioversion to sinus rhythm February 2019. Echocardiogram August 2020 showed normal LV function, severe RV dysfunction, severe right ventricular enlargement, moderate left atrial enlargement, severe right atrial enlargement, severe tricuspid regurgitation, mild aortic insufficiency. Follow-up study September 2020 showed moderate to large secundum atrial septal defect with bidirectional shunting. ABIs May 2021 normal on the right and severe on the left. Peripheral vascular disease followed by Dr. Scot Dock. Echocardiogram repeated December 2021 and showed normal LV function, moderate left ventricular hypertrophy, grade 2 diastolic dysfunction, severe pulmonary hypertension, moderate right atrial enlargement, mild mitral regurgitation, moderate to severe tricuspid regurgitation, mild aortic insufficiency. Thyroid ultrasound October 2022 showed a nodule in the inferior left thyroid lobe.  Biopsy results showed scant follicular epithelium present.  I requested patient follow-up with primary care for this issue.  Since last seen, she states her dyspnea is actually improved compared to previous.  There is no orthopnea, PND, chest pain, palpitations, syncope or bleeding.  She has chronic mild pedal edema but improved compared to previous. ? ?Current Outpatient Medications  ?Medication Sig Dispense Refill  ? acetaminophen (TYLENOL) 650 MG CR tablet Take 1,300 mg by mouth every 8 (eight) hours as needed for pain.    ? amiodarone (PACERONE) 200 MG  tablet TAKE ONE TABLET TWICE DAILY FOR ONE WEEK THEN DECREASE TO 1 TABLET DAILY 90 tablet 3  ? amLODipine (NORVASC) 10 MG tablet Take 1 tablet (10 mg total) by mouth daily. 90 tablet 3  ? atorvastatin (LIPITOR) 20 MG tablet TAKE 1 TABLET BY MOUTH DAILY AT 6 PM. 90 tablet 3  ? ELIQUIS 2.5 MG TABS tablet TAKE 1 TABLET BY MOUTH TWICE A DAY 90 tablet 3  ? furosemide (LASIX) 40 MG tablet TAKE 1 TABLET BY MOUTH EVERY DAY 90 tablet 3  ? levothyroxine (SYNTHROID) 25 MCG tablet Take 1 tablet (25 mcg total) by mouth daily. 90 tablet 3  ? ?No current facility-administered medications for this visit.  ? ? ? ?Past Medical History:  ?Diagnosis Date  ? Arthritis   ? BREAST BIOPSY, HX OF 06/10/2006  ? Breast cyst   ? Dysrhythmia   ? HIP PAIN, LEFT 01/31/2007  ? HYPERLIPIDEMIA 06/10/2006  ? HYPERTENSION 06/10/2006  ? Impaired glucose tolerance   ? Lower extremity edema   ? NEOPLASMS UNSPEC NATURE BONE SOFT TISSUE&SKIN 05/31/2008  ? OSTEOPOROSIS 06/10/2006  ? Pulmonary embolus ([place])   ? ? ?Past Surgical History:  ?Procedure Laterality Date  ? ABDOMINAL HYSTERECTOMY    ? APPENDECTOMY    ? BREAST SURGERY    ? bx x2  ? CARDIOVERSION N/A 03/11/2017  ? Procedure: CARDIOVERSION;  Surgeon: Sanda Klein, MD;  Location: [facility];  Service: Cardiovascular;  Laterality: N/A;  ? CHOLECYSTECTOMY  1980  ? TRANSFORAMINAL LUMBAR INTERBODY FUSION (TLIF) WITH PEDICLE SCREW FIXATION 1 LEVEL Left 10/27/2018  ? Procedure: Left Lumbar five-sacral one Transforaminal lumbar interbody fusion;  Surgeon: Erline Levine, MD;  Location: [facility];  Service: Neurosurgery;  Laterality: Left;  ? ? ?Social History  ? ?Socioeconomic History  ? Marital status: Married  ?  Spouse name: Not on file  ? Number  of children: 4  ? Years of education: Not on file  ? Highest education level: Not on file  ?Occupational History  ? Not on file  ?Tobacco Use  ? Smoking status: Never  ? Smokeless tobacco: Current  ?  Types: Snuff  ?Vaping Use  ? Vaping Use: Never used  ?Substance and  Sexual Activity  ? Alcohol use: No  ?  Alcohol/week: 0.0 standard drinks  ? Drug use: No  ? Sexual activity: Not Currently  ?Other Topics Concern  ? Not on file  ?Social History Narrative  ? She is retired   ? Worked in [employer] for 20 years   ? Married for 43 years   ? 4 children - live locally.    ?   ? ?Social Determinants of Health  ? ?Financial Resource Strain: Low Risk   ? Difficulty of Paying Living Expenses: Not hard at all  ?Food Insecurity: No Food Insecurity  ? Worried About Charity fundraiser in the Last Year: Never true  ? Ran Out of Food in the Last Year: Never true  ?Transportation Needs: No Transportation Needs  ? Lack of Transportation (Medical): No  ? Lack of Transportation (Non-Medical): No  ?Physical Activity: Inactive  ? Days of Exercise per Week: 0 days  ? Minutes of Exercise per Session: 0 min  ?Stress: No Stress Concern Present  ? Feeling of Stress : Not at all  ?Social Connections: Moderately Integrated  ? Frequency of Communication with Friends and Family: More than three times a week  ? Frequency of Social Gatherings with Friends and Family: More than three times a week  ? Attends Religious Services: More than 4 times per year  ? Active Member of Clubs or Organizations: No  ? Attends Archivist Meetings: Never  ? Marital Status: Married  ?Intimate Partner Violence: Not At Risk  ? Fear of Current or Ex-Partner: No  ? Emotionally Abused: No  ? Physically Abused: No  ? Sexually Abused: No  ? ? ?Family History  ?Problem Relation Age of Onset  ? Heart disease Mother   ?     Pacemaker  ? Heart disease Father   ? Asthma Father   ? Diabetes Father   ? Cancer Maternal Aunt   ?     breast  ? Heart disease Sister   ? Hyperlipidemia Sister   ? Hypertension Sister   ? Lung cancer Sister   ? ? ?ROS: no fevers or chills, productive cough, hemoptysis, dysphasia, odynophagia, melena, hematochezia, dysuria, hematuria, rash, seizure activity, orthopnea, PND, pedal edema, claudication.  Remaining systems are negative. ? ?Physical Exam: ?Well-developed well-nourished in no acute distress.  ?Skin is warm and dry.  ?HEENT is normal.  ?Neck is supple.  ?Chest is clear to auscultation with normal expansion.  ?Cardiovascular exam is regular rate and rhythm.  ?Abdominal exam nontender or distended. No masses palpated. ?Extremities show trace to 1+ edema. ?neuro grossly intact ? ?A/P ? ?1 chronic right-sided heart failure-volume status appears to be reasonable.  She has had problems with progressive renal insufficiency in the past.  We will continue diuretic at present dose.  Check potassium and renal function. ? ?2 history of paroxysmal atrial fibrillation-patient remains in sinus rhythm on exam today.  Continue amiodarone.  Continue apixaban. ? ?3 previous thyroid nodule-biopsy results unrevealing.  I recommended that she follow-up with primary care for this issue. ? ?4 atrial septal defect-as outlined above patient requested only conservative measures.  I think  this is appropriate given her age and overall medical condition.  We will repeat echocardiogram. ? ?5 hypertension-blood pressure controlled.  Continue present medications. ? ?6 hyperlipidemia-continue statin. ? ?7 tricuspid regurgitation-plan conservative measures at patient's request and given patient's age and comorbidities. ? ?8 peripheral vascular disease-continue medical therapy. ? ?9 chronic stage III kidney disease-repeat BUN and creatinine. ? ?Kirk Ruths, MD ? ? ? ?

## 2021-05-15 ENCOUNTER — Ambulatory Visit: Payer: Medicare HMO | Admitting: Cardiology

## 2021-05-15 ENCOUNTER — Encounter: Payer: Self-pay | Admitting: Cardiology

## 2021-05-15 VITALS — BP 148/62 | HR 80 | Ht 67.0 in | Wt 163.4 lb

## 2021-05-15 DIAGNOSIS — I079 Rheumatic tricuspid valve disease, unspecified: Secondary | ICD-10-CM | POA: Diagnosis not present

## 2021-05-15 DIAGNOSIS — Q211 Atrial septal defect, unspecified: Secondary | ICD-10-CM

## 2021-05-15 DIAGNOSIS — I480 Paroxysmal atrial fibrillation: Secondary | ICD-10-CM | POA: Diagnosis not present

## 2021-05-15 DIAGNOSIS — I10 Essential (primary) hypertension: Secondary | ICD-10-CM | POA: Diagnosis not present

## 2021-05-15 LAB — BASIC METABOLIC PANEL
BUN/Creatinine Ratio: 27 (ref 12–28)
BUN: 48 mg/dL — ABNORMAL HIGH (ref 8–27)
CO2: 21 mmol/L (ref 20–29)
Calcium: 9.7 mg/dL (ref 8.7–10.3)
Chloride: 106 mmol/L (ref 96–106)
Creatinine, Ser: 1.79 mg/dL — ABNORMAL HIGH (ref 0.57–1.00)
Glucose: 126 mg/dL — ABNORMAL HIGH (ref 70–99)
Potassium: 4.8 mmol/L (ref 3.5–5.2)
Sodium: 141 mmol/L (ref 134–144)
eGFR: 27 mL/min/{1.73_m2} — ABNORMAL LOW (ref >59)

## 2021-05-15 NOTE — Patient Instructions (Signed)

## 2021-05-16 ENCOUNTER — Encounter: Payer: Self-pay | Admitting: *Deleted

## 2021-06-27 ENCOUNTER — Other Ambulatory Visit: Payer: Self-pay | Admitting: Adult Health

## 2021-06-27 DIAGNOSIS — E039 Hypothyroidism, unspecified: Secondary | ICD-10-CM

## 2021-07-03 ENCOUNTER — Ambulatory Visit (INDEPENDENT_AMBULATORY_CARE_PROVIDER_SITE_OTHER): Payer: Medicare HMO | Admitting: Adult Health

## 2021-07-03 ENCOUNTER — Encounter: Payer: Self-pay | Admitting: Adult Health

## 2021-07-03 ENCOUNTER — Ambulatory Visit (INDEPENDENT_AMBULATORY_CARE_PROVIDER_SITE_OTHER): Payer: Medicare HMO

## 2021-07-03 VITALS — BP 128/60 | HR 52 | Temp 99.1°F | Ht 67.0 in | Wt 164.2 lb

## 2021-07-03 DIAGNOSIS — N183 Chronic kidney disease, stage 3 unspecified: Secondary | ICD-10-CM | POA: Diagnosis not present

## 2021-07-03 DIAGNOSIS — I50812 Chronic right heart failure: Secondary | ICD-10-CM | POA: Diagnosis not present

## 2021-07-03 DIAGNOSIS — I2699 Other pulmonary embolism without acute cor pulmonale: Secondary | ICD-10-CM | POA: Diagnosis not present

## 2021-07-03 DIAGNOSIS — E7439 Other disorders of intestinal carbohydrate absorption: Secondary | ICD-10-CM | POA: Diagnosis not present

## 2021-07-03 DIAGNOSIS — I739 Peripheral vascular disease, unspecified: Secondary | ICD-10-CM

## 2021-07-03 DIAGNOSIS — E039 Hypothyroidism, unspecified: Secondary | ICD-10-CM

## 2021-07-03 DIAGNOSIS — R059 Cough, unspecified: Secondary | ICD-10-CM | POA: Diagnosis not present

## 2021-07-03 DIAGNOSIS — R051 Acute cough: Secondary | ICD-10-CM

## 2021-07-03 DIAGNOSIS — I10 Essential (primary) hypertension: Secondary | ICD-10-CM | POA: Diagnosis not present

## 2021-07-03 DIAGNOSIS — E785 Hyperlipidemia, unspecified: Secondary | ICD-10-CM | POA: Diagnosis not present

## 2021-07-03 DIAGNOSIS — Z0000 Encounter for general adult medical examination without abnormal findings: Secondary | ICD-10-CM | POA: Diagnosis not present

## 2021-07-03 DIAGNOSIS — I480 Paroxysmal atrial fibrillation: Secondary | ICD-10-CM

## 2021-07-03 LAB — COMPREHENSIVE METABOLIC PANEL
ALT: 19 U/L (ref 0–35)
AST: 23 U/L (ref 0–37)
Albumin: 4 g/dL (ref 3.5–5.2)
Alkaline Phosphatase: 86 U/L (ref 39–117)
BUN: 49 mg/dL — ABNORMAL HIGH (ref 6–23)
CO2: 22 mEq/L (ref 19–32)
Calcium: 9.6 mg/dL (ref 8.4–10.5)
Chloride: 105 mEq/L (ref 96–112)
Creatinine, Ser: 2 mg/dL — ABNORMAL HIGH (ref 0.40–1.20)
GFR: 21.99 mL/min — ABNORMAL LOW (ref >60.00)
Glucose, Bld: 127 mg/dL — ABNORMAL HIGH (ref 70–99)
Potassium: 4.7 mEq/L (ref 3.5–5.1)
Sodium: 141 mEq/L (ref 135–145)
Total Bilirubin: 0.7 mg/dL (ref 0.2–1.2)
Total Protein: 7.6 g/dL (ref 6.0–8.3)

## 2021-07-03 LAB — CBC WITH DIFFERENTIAL/PLATELET
Basophils Absolute: 0 10*3/uL (ref 0.0–0.1)
Basophils Relative: 0.5 % (ref 0.0–3.0)
Eosinophils Absolute: 0.1 10*3/uL (ref 0.0–0.7)
Eosinophils Relative: 1.5 % (ref 0.0–5.0)
HCT: 41.7 % (ref 36.0–46.0)
Hemoglobin: 13.6 g/dL (ref 12.0–15.0)
Lymphocytes Relative: 33.5 % (ref 12.0–46.0)
Lymphs Abs: 2.7 10*3/uL (ref 0.7–4.0)
MCHC: 32.6 g/dL (ref 30.0–36.0)
MCV: 93.8 fl (ref 78.0–100.0)
Monocytes Absolute: 0.8 10*3/uL (ref 0.1–1.0)
Monocytes Relative: 9.2 % (ref 3.0–12.0)
Neutro Abs: 4.5 10*3/uL (ref 1.4–7.7)
Neutrophils Relative %: 55.3 % (ref 43.0–77.0)
Platelets: 286 10*3/uL (ref 150.0–400.0)
RBC: 4.45 Mil/uL (ref 3.87–5.11)
RDW: 16.7 % — ABNORMAL HIGH (ref 11.5–15.5)
WBC: 8.2 10*3/uL (ref 4.0–10.5)

## 2021-07-03 LAB — LIPID PANEL
Cholesterol: 140 mg/dL (ref 0–200)
HDL: 61.7 mg/dL (ref >39.00)
LDL Cholesterol: 58 mg/dL (ref 0–99)
NonHDL: 78.2
Total CHOL/HDL Ratio: 2
Triglycerides: 103 mg/dL (ref 0.0–149.0)
VLDL: 20.6 mg/dL (ref 0.0–40.0)

## 2021-07-03 LAB — HEMOGLOBIN A1C: Hgb A1c MFr Bld: 6.4 % (ref 4.6–6.5)

## 2021-07-03 LAB — VITAMIN D 25 HYDROXY (VIT D DEFICIENCY, FRACTURES): VITD: 34.25 ng/mL (ref 30.00–100.00)

## 2021-07-03 LAB — TSH: TSH: 2.88 u[IU]/mL (ref 0.35–5.50)

## 2021-07-03 NOTE — Patient Instructions (Signed)
It was great seeing you today   We will follow up with you regarding your lab work and chest xray   Please let me know if you need anything   Get some mucinex at the pharmacy to help with the cough [facility])   I will see you back in on year or sooner if needed

## 2021-07-03 NOTE — Progress Notes (Signed)
Subjective:    Patient ID: Tammy [name] Age, female    DOB: March 13, 1931, 88 y.o.   MRN: [id number]  HPI  88 year old female who  has a past medical history of Arthritis, BREAST BIOPSY, HX OF (06/10/2006), Breast cyst, Dysrhythmia, HIP PAIN, LEFT (01/31/2007), HYPERLIPIDEMIA (06/10/2006), HYPERTENSION (06/10/2006), Impaired glucose tolerance, Lower extremity edema, NEOPLASMS UNSPEC NATURE BONE SOFT TISSUE&SKIN (05/31/2008), OSTEOPOROSIS (06/10/2006), and Pulmonary embolus ([place]).  Hyperlipidemia -currently managed with Lipitor 20 mg daily.  Denies myalgia  Lab Results  Component Value Date   CHOL 139 07/02/2020   HDL 64.20 07/02/2020   LDLCALC 57 07/02/2020   TRIG 88.0 07/02/2020   CHOLHDL 2 07/02/2020   Hypertension -managed with Norvasc 10 mg daily and Lasix 40 mg daily.  She denies dizziness, lightheadedness, or chest pain. BP Readings from Last 3 Encounters:  07/03/21 128/60  05/15/21 (!) 148/62  01/08/21 (!) 129/53    PAF -managed by cardiology.  Currently prescribed amiodarone 200 mg BID and Eliquis 2.5 mg twice daily.  She did undergo a successful cardioversion to sinus rhythm in February 2019.  Chronic right-sided heart failure-is on Lasix 40 mg daily.  Her volume status seems to be reasonable.  PVD-followed by vascular surgery  History of Pulmonary Embolism - on Eliquis   Hypothyroidism -managed with Synthroid 25 mcg every morning.  She did have a thyroid ultrasound in October 2022 that showed a nodule in the inferior left thyroid lobe.  Her biopsy results showed scant follicular epithelium present.  CKD III - takes lasix 40 mg daily.   Glucose Intolerance - not current managed with medication  Lab Results  Component Value Date   HGBA1C 5.9 (A) 01/02/2020    Acute Cough -she reports having "cold" roughly 2 months ago, unfortunately continues to have constant semiproductive cough with yellow mucus.  She denies fevers or chills.  She is routinely short of breath and has chronic  fatigue but feels as though the fatigue aspect has become worse since she has had the cough.  There is no worsening shortness of breath or wheezing.  Review of Systems  Constitutional:  Positive for fatigue.  HENT: Negative.    Eyes: Negative.   Respiratory:  Positive for shortness of breath.   Cardiovascular: Negative.   Gastrointestinal: Negative.   Endocrine: Negative.   Genitourinary: Negative.   Musculoskeletal:  Positive for arthralgias.  Skin: Negative.   Allergic/Immunologic: Negative.   Neurological: Negative.   Hematological: Negative.   Psychiatric/Behavioral: Negative.     Past Medical History:  Diagnosis Date   Arthritis    BREAST BIOPSY, HX OF 06/10/2006   Breast cyst    Dysrhythmia    HIP PAIN, LEFT 01/31/2007   HYPERLIPIDEMIA 06/10/2006   HYPERTENSION 06/10/2006   Impaired glucose tolerance    Lower extremity edema    NEOPLASMS UNSPEC NATURE BONE SOFT TISSUE&SKIN 05/31/2008   OSTEOPOROSIS 06/10/2006   Pulmonary embolus ([place])     Social History   Socioeconomic History   Marital status: Married    Spouse name: Not on file   Number of children: 4   Years of education: Not on file   Highest education level: Not on file  Occupational History   Not on file  Tobacco Use   Smoking status: Never   Smokeless tobacco: Current    Types: Snuff  Vaping Use   Vaping Use: Never used  Substance and Sexual Activity   Alcohol use: No    Alcohol/week: 0.0 standard drinks of alcohol  Drug use: No   Sexual activity: Not Currently  Other Topics Concern   Not on file  Social History Narrative   She is retired    Worked in [employer] for 20 years    Married for 43 years    4 children - live locally.        Social Determinants of Health   Financial Resource Strain: Low Risk  (10/02/2020)   Overall Financial Resource Strain (CARDIA)    Difficulty of Paying Living Expenses: Not hard at all  Food Insecurity: No Food Insecurity (10/02/2020)   Hunger Vital Sign     Worried About Running Out of Food in the Last Year: Never true    Ran Out of Food in the Last Year: Never true  Transportation Needs: No Transportation Needs (10/02/2020)   PRAPARE - Hydrologist (Medical): No    Lack of Transportation (Non-Medical): No  Physical Activity: Inactive (10/02/2020)   Exercise Vital Sign    Days of Exercise per Week: 0 days    Minutes of Exercise per Session: 0 min  Stress: No Stress Concern Present (10/02/2020)   [place]    Feeling of Stress : Not at all  Social Connections: Moderately Integrated (10/02/2020)   Social Connection and Isolation Panel [NHANES]    Frequency of Communication with Friends and Family: More than three times a week    Frequency of Social Gatherings with Friends and Family: More than three times a week    Attends Religious Services: More than 4 times per year    Active Member of [employer] or Organizations: No    Attends Archivist Meetings: Never    Marital Status: Married  Human resources officer Violence: Not At Risk (10/02/2020)   Humiliation, Afraid, Rape, and Kick questionnaire    Fear of Current or Ex-Partner: No    Emotionally Abused: No    Physically Abused: No    Sexually Abused: No    Past Surgical History:  Procedure Laterality Date   ABDOMINAL HYSTERECTOMY     APPENDECTOMY     BREAST SURGERY     bx x2   CARDIOVERSION N/A 03/11/2017   Procedure: CARDIOVERSION;  Surgeon: Sanda Klein, MD;  Location: [facility];  Service: Cardiovascular;  Laterality: N/A;   CHOLECYSTECTOMY  1980   TRANSFORAMINAL LUMBAR INTERBODY FUSION (TLIF) WITH PEDICLE SCREW FIXATION 1 LEVEL Left 10/27/2018   Procedure: Left Lumbar five-sacral one Transforaminal lumbar interbody fusion;  Surgeon: Erline Levine, MD;  Location: [place];  Service: Neurosurgery;  Laterality: Left;    Family History  Problem Relation Age of Onset   Heart disease Mother         Pacemaker   Heart disease Father    Asthma Father    Diabetes Father    Cancer Maternal Aunt        breast   Heart disease Sister    Hyperlipidemia Sister    Hypertension Sister    Lung cancer Sister     Allergies  Allergen Reactions   Tuberculin Tests Other (See Comments)    Tuberculosis shot, red spot on arm    Current Outpatient Medications on File Prior to Visit  Medication Sig Dispense Refill   acetaminophen (TYLENOL) 650 MG CR tablet Take 1,300 mg by mouth every 8 (eight) hours as needed for pain.     amiodarone (PACERONE) 200 MG tablet TAKE ONE TABLET TWICE DAILY FOR ONE WEEK THEN  DECREASE TO 1 TABLET DAILY 90 tablet 3   amLODipine (NORVASC) 10 MG tablet TAKE 1 TABLET BY MOUTH EVERY DAY 90 tablet 3   atorvastatin (LIPITOR) 20 MG tablet TAKE 1 TABLET BY MOUTH DAILY AT 6 PM. 90 tablet 3   ELIQUIS 2.5 MG TABS tablet TAKE 1 TABLET BY MOUTH TWICE A DAY 90 tablet 3   furosemide (LASIX) 40 MG tablet TAKE 1 TABLET BY MOUTH EVERY DAY 90 tablet 3   levothyroxine (SYNTHROID) 25 MCG tablet TAKE 1 TABLET BY MOUTH EVERY DAY 90 tablet 3   No current facility-administered medications on file prior to visit.    BP 128/60 (BP Location: Left Arm, Patient Position: Sitting, Cuff Size: Normal)   Pulse (!) 52   Temp 99.1 F (37.3 C) (Oral)   Ht '5\' 7"'$  (1.702 m)   Wt 164 lb 3.2 oz (74.5 kg)   LMP  (LMP Unknown)   SpO2 98%   BMI 25.72 kg/m       Objective:   Physical Exam Vitals and nursing note reviewed.  Constitutional:      General: She is not in acute distress.    Appearance: Normal appearance. She is well-developed. She is not ill-appearing.  HENT:     Head: Normocephalic and atraumatic.     Right Ear: Tympanic membrane, ear canal and external ear normal. There is no impacted cerumen.     Left Ear: Tympanic membrane, ear canal and external ear normal. There is no impacted cerumen.     Nose: Nose normal. No congestion or rhinorrhea.     Mouth/Throat:     Mouth: Mucous  membranes are moist.     Pharynx: Oropharynx is clear. No oropharyngeal exudate or posterior oropharyngeal erythema.  Eyes:     General:        Right eye: No discharge.        Left eye: No discharge.     Extraocular Movements: Extraocular movements intact.     Conjunctiva/sclera: Conjunctivae normal.     Pupils: Pupils are equal, round, and reactive to light.  Neck:     Thyroid: No thyromegaly.     Vascular: No carotid bruit.     Trachea: No tracheal deviation.  Cardiovascular:     Rate and Rhythm: Normal rate and regular rhythm.     Pulses: Normal pulses.     Heart sounds: Normal heart sounds. No murmur heard.    No friction rub. No gallop.  Pulmonary:     Effort: Pulmonary effort is normal. No respiratory distress.     Breath sounds: No stridor or decreased air movement. Examination of the left-upper field reveals wheezing. Examination of the right-middle field reveals wheezing. Wheezing present. No rhonchi or rales.     Comments: Trace wheezing noted   Chest:     Chest wall: No tenderness.  Abdominal:     General: Abdomen is flat. Bowel sounds are normal. There is no distension.     Palpations: Abdomen is soft. There is no mass.     Tenderness: There is no abdominal tenderness. There is no right CVA tenderness, left CVA tenderness, guarding or rebound.     Hernia: No hernia is present.  Musculoskeletal:        General: No swelling, tenderness, deformity or signs of injury. Normal range of motion.     Cervical back: Normal range of motion and neck supple.     Right lower leg: 1+ Edema present.     Left lower leg:  1+ Edema present.  Lymphadenopathy:     Cervical: No cervical adenopathy.  Skin:    General: Skin is warm and dry.     Coloration: Skin is not jaundiced or pale.     Findings: No bruising, erythema, lesion or rash.  Neurological:     General: No focal deficit present.     Mental Status: She is alert and oriented to person, place, and time.     Cranial Nerves: No  cranial nerve deficit.     Sensory: No sensory deficit.     Motor: No weakness.     Coordination: Coordination normal.     Gait: Gait normal.     Deep Tendon Reflexes: Reflexes normal.  Psychiatric:        Mood and Affect: Mood normal.        Behavior: Behavior normal.        Thought Content: Thought content normal.        Judgment: Judgment normal.       Assessment & Plan:  1. Routine general medical examination at a health care facility - Encouraged activity and heart healthy diet.  - Follow up in one year or sooner if needed - CBC with Differential/Platelet; Future - Comprehensive metabolic panel; Future - Hemoglobin A1c; Future - Lipid panel; Future - TSH; Future - VITAMIN D 25 Hydroxy (Vit-D Deficiency, Fractures); Future - VITAMIN D 25 Hydroxy (Vit-D Deficiency, Fractures) - TSH - Lipid panel - Hemoglobin A1c - Comprehensive metabolic panel - CBC with Differential/Platelet  2. Essential hypertension - Well controlled. No change in medications  - CBC with Differential/Platelet; Future - Comprehensive metabolic panel; Future - Hemoglobin A1c; Future - Lipid panel; Future - TSH; Future - VITAMIN D 25 Hydroxy (Vit-D Deficiency, Fractures); Future - VITAMIN D 25 Hydroxy (Vit-D Deficiency, Fractures) - TSH - Lipid panel - Hemoglobin A1c - Comprehensive metabolic panel - CBC with Differential/Platelet  3. Paroxysmal atrial fibrillation (HCC) - Sinus Rhythm today  - CBC with Differential/Platelet; Future - Comprehensive metabolic panel; Future - Hemoglobin A1c; Future - Lipid panel; Future - TSH; Future - VITAMIN D 25 Hydroxy (Vit-D Deficiency, Fractures); Future - VITAMIN D 25 Hydroxy (Vit-D Deficiency, Fractures) - TSH - Lipid panel - Hemoglobin A1c - Comprehensive metabolic panel - CBC with Differential/Platelet  4. Hypothyroidism, unspecified type - Consider increase in synthroid  - CBC with Differential/Platelet; Future - Comprehensive metabolic  panel; Future - Hemoglobin A1c; Future - Lipid panel; Future - TSH; Future - VITAMIN D 25 Hydroxy (Vit-D Deficiency, Fractures); Future - VITAMIN D 25 Hydroxy (Vit-D Deficiency, Fractures) - TSH - Lipid panel - Hemoglobin A1c - Comprehensive metabolic panel - CBC with Differential/Platelet  5. Dyslipidemia - Consider increase in statin  - CBC with Differential/Platelet; Future - Comprehensive metabolic panel; Future - Hemoglobin A1c; Future - Lipid panel; Future - TSH; Future - VITAMIN D 25 Hydroxy (Vit-D Deficiency, Fractures); Future - VITAMIN D 25 Hydroxy (Vit-D Deficiency, Fractures) - TSH - Lipid panel - Hemoglobin A1c - Comprehensive metabolic panel - CBC with Differential/Platelet  6. PAD (peripheral artery disease) ([place]) - Continue with medication therapy  - CBC with Differential/Platelet; Future - Comprehensive metabolic panel; Future - Hemoglobin A1c; Future - Lipid panel; Future - TSH; Future - VITAMIN D 25 Hydroxy (Vit-D Deficiency, Fractures); Future - VITAMIN D 25 Hydroxy (Vit-D Deficiency, Fractures) - TSH - Lipid panel - Hemoglobin A1c - Comprehensive metabolic panel - CBC with Differential/Platelet  7. Embolism, pulmonary with infarction (HCC) - Continue Eliquis  - CBC  with Differential/Platelet; Future - Comprehensive metabolic panel; Future - Hemoglobin A1c; Future - Lipid panel; Future - TSH; Future - VITAMIN D 25 Hydroxy (Vit-D Deficiency, Fractures); Future - VITAMIN D 25 Hydroxy (Vit-D Deficiency, Fractures) - TSH - Lipid panel - Hemoglobin A1c - Comprehensive metabolic panel - CBC with Differential/Platelet  8. Chronic right-sided congestive heart failure ([place]) - Per Cardiology  - CBC with Differential/Platelet; Future - Comprehensive metabolic panel; Future - Hemoglobin A1c; Future - Lipid panel; Future - TSH; Future - VITAMIN D 25 Hydroxy (Vit-D Deficiency, Fractures); Future - VITAMIN D 25 Hydroxy (Vit-D Deficiency,  Fractures) - TSH - Lipid panel - Hemoglobin A1c - Comprehensive metabolic panel - CBC with Differential/Platelet  9. Glucose intolerance - Consider metformin  - CBC with Differential/Platelet; Future - Comprehensive metabolic panel; Future - Hemoglobin A1c; Future - Lipid panel; Future - TSH; Future - VITAMIN D 25 Hydroxy (Vit-D Deficiency, Fractures); Future - VITAMIN D 25 Hydroxy (Vit-D Deficiency, Fractures) - TSH - Lipid panel - Hemoglobin A1c - Comprehensive metabolic panel - CBC with Differential/Platelet  10. Acute cough - likely post viral cough but will do chest xray today due to duration  - Can start on mucinex  - DG Chest 2 View; Future  [name], NP

## 2021-09-02 ENCOUNTER — Telehealth: Payer: Self-pay | Admitting: Pharmacist

## 2021-09-02 NOTE — Chronic Care Management (AMB) (Signed)
Chronic Care Management Pharmacy Assistant   Name: Tammy [name]  MRN: [id number] DOB: [birth year]  Reason for Encounter: Disease State   Conditions to be addressed/monitored: HTN  Recent office visits:  07/03/21 Tammy Peng, NP - Patient presented for Routine general medical examination at a health care facility and other concerns. No medication changes Recommended Mucinex OTC.  Recent consult visits:  05/15/21 Tammy Perla, MD (Cardiology) - Patient presented for Paroxysmal atrial fibrillation and other concerns. No medication changes.  Hospital visits:  None in previous 6 months  Medications: Outpatient Encounter Medications as of 09/02/2021  Medication Sig   acetaminophen (TYLENOL) 650 MG CR tablet Take 1,300 mg by mouth every 8 (eight) hours as needed for pain.   amiodarone (PACERONE) 200 MG tablet TAKE ONE TABLET TWICE DAILY FOR ONE WEEK THEN DECREASE TO 1 TABLET DAILY   amLODipine (NORVASC) 10 MG tablet TAKE 1 TABLET BY MOUTH EVERY DAY   atorvastatin (LIPITOR) 20 MG tablet TAKE 1 TABLET BY MOUTH DAILY AT 6 PM.   ELIQUIS 2.5 MG TABS tablet TAKE 1 TABLET BY MOUTH TWICE A DAY   furosemide (LASIX) 40 MG tablet TAKE 1 TABLET BY MOUTH EVERY DAY   levothyroxine (SYNTHROID) 25 MCG tablet TAKE 1 TABLET BY MOUTH EVERY DAY   No facility-administered encounter medications on file as of 09/02/2021.  Reviewed chart prior to disease state call. Spoke with patient regarding BP  Recent Office Vitals: BP Readings from Last 3 Encounters:  07/03/21 128/60  05/15/21 (!) 148/62  01/08/21 (!) 129/53   Pulse Readings from Last 3 Encounters:  07/03/21 (!) 52  05/15/21 80  10/23/20 65    Wt Readings from Last 3 Encounters:  07/03/21 164 lb 3.2 oz (74.5 kg)  05/15/21 163 lb 6.4 oz (74.1 kg)  10/23/20 168 lb 12.8 oz (76.6 kg)     Kidney Function Lab Results  Component Value Date/Time   CREATININE 2.00 (H) 07/03/2021 09:28 AM   CREATININE 1.79 (H) 05/15/2021 09:12 AM   CREATININE  1.10 (H) 11/05/2014 11:51 AM   CREATININE 1.08 04/13/2014 09:02 AM   GFR 21.99 (L) 07/03/2021 09:28 AM   GFRNONAA 29 (L) 01/08/2020 12:24 PM   GFRNONAA 56 (L) 03/30/2014 09:31 AM   GFRAA 33 (L) 01/08/2020 12:24 PM   GFRAA 65 03/30/2014 09:31 AM       Latest Ref Rng & Units 07/03/2021    9:28 AM 05/15/2021    9:12 AM 10/23/2020   10:15 AM  BMP  Glucose 70 - 99 mg/dL 127  126  139   BUN 6 - 23 mg/dL 49  48  33   Creatinine 0.40 - 1.20 mg/dL 2.00  1.79  1.63   BUN/Creat Ratio 12 - '28  27  20   '$ Sodium 135 - 145 mEq/L 141  141  147   Potassium 3.5 - 5.1 mEq/L 4.7  4.8  4.2   Chloride 96 - 112 mEq/L 105  106  107   CO2 19 - 32 mEq/L '22  21  19   '$ Calcium 8.4 - 10.5 mg/dL 9.6  9.7  9.2     Current antihypertensive regimen:  Amlodipine 10 mg 1 tablet daily How often are you checking your Blood Pressure? 1-2x per week Current home BP readings: 140/56 133/57 138/60 What recent interventions/DTPs have been made by any provider to improve Blood Pressure control since last CPP Visit: Patient reports no changes. She denies any hyper/hypotensive symptoms. Any recent hospitalizations or ED  visits since last visit with CPP? No Notes: Patient reports she has been doing well but still has the cough and has still been using Mucinex. No other sx or concerns but lingering cough. Advised I would ask MP if she had any other recommendations for her. She was in agreement.  Adherence Review: Is the patient currently on ACE/ARB medication? No Does the patient have >5 day gap between last estimated fill dates? No  Per Tammy [name] Patient may try Delsym OTC with dextromethorphan as only ingredient if is keeping her up at night and follow up with [facility]. Call to patient advised of the above and she was in agreement.   Care Gaps: BP- 138/60 09/02/21 CCM- 12/23 AWV- 9/22  Star Rating Drugs: Atorvastatin 20 mg - Last filled 07/21/21 90 DS at [place] Pharmacist  Assistant [phone]

## 2021-09-29 ENCOUNTER — Other Ambulatory Visit: Payer: Self-pay | Admitting: Cardiology

## 2021-10-01 NOTE — Telephone Encounter (Signed)
Prescription refill request for Eliquis received. Indication: PAF Last office visit: 05/15/21 Tammy Ross MD Scr: 2.0 on 07/03/21 Age: 88 Weight: 74.1kg  Based on above findings Eliquis 2.'5mg'$  twice daily is the appropriate dose.  Refill approved.

## 2021-10-15 ENCOUNTER — Ambulatory Visit: Payer: Medicare HMO

## 2021-10-15 ENCOUNTER — Ambulatory Visit (INDEPENDENT_AMBULATORY_CARE_PROVIDER_SITE_OTHER): Payer: Medicare HMO

## 2021-10-15 VITALS — BP 122/62 | HR 60 | Temp 98.1°F | Ht 67.0 in | Wt 167.6 lb

## 2021-10-15 DIAGNOSIS — Z0000 Encounter for general adult medical examination without abnormal findings: Secondary | ICD-10-CM

## 2021-10-15 NOTE — Progress Notes (Signed)
Subjective:   Tammy [name] is a 88 y.o. female who presents for Medicare Annual (Subsequent) preventive examination.  Review of Systems     Cardiac Risk Factors include: advanced age (>38mn, >>22women);hypertension     Objective:    Today's Vitals   10/15/21 1459  BP: 122/62  Pulse: 60  Temp: 98.1 F (36.7 C)  TempSrc: Oral  SpO2: 98%  Weight: 167 lb 9.6 oz (76 kg)  Height: '5\' 7"'$  (1.702 m)   Body mass index is 26.25 kg/m.     10/15/2021    3:27 PM 10/02/2020    3:09 PM 11/22/2019    9:57 AM 10/25/2018    9:59 AM 06/07/2018   10:57 AM 06/02/2018    2:38 PM 12/28/2017    9:40 AM  Advanced Directives  Does Patient Have a Medical Advance Directive? No No No No No No No  Would patient like information on creating a medical advance directive? No - Patient declined No - Patient declined No - Patient declined No - Patient declined No - Patient declined No - Patient declined No - Patient declined    Current Medications (verified) Outpatient Encounter Medications as of 10/15/2021  Medication Sig   acetaminophen (TYLENOL) 650 MG CR tablet Take 1,300 mg by mouth every 8 (eight) hours as needed for pain.   amiodarone (PACERONE) 200 MG tablet TAKE ONE TABLET TWICE DAILY FOR ONE WEEK THEN DECREASE TO 1 TABLET DAILY   amLODipine (NORVASC) 10 MG tablet TAKE 1 TABLET BY MOUTH EVERY DAY   apixaban (ELIQUIS) 2.5 MG TABS tablet TAKE 1 TABLET BY MOUTH TWICE A DAY   atorvastatin (LIPITOR) 20 MG tablet TAKE 1 TABLET BY MOUTH DAILY AT 6 PM.   furosemide (LASIX) 40 MG tablet TAKE 1 TABLET BY MOUTH EVERY DAY   levothyroxine (SYNTHROID) 25 MCG tablet TAKE 1 TABLET BY MOUTH EVERY DAY   No facility-administered encounter medications on file as of 10/15/2021.    Allergies (verified) Tuberculin tests   History: Past Medical History:  Diagnosis Date   Arthritis    BREAST BIOPSY, HX OF 06/10/2006   Breast cyst    Dysrhythmia    HIP PAIN, LEFT 01/31/2007   HYPERLIPIDEMIA 06/10/2006   HYPERTENSION  06/10/2006   Impaired glucose tolerance    Lower extremity edema    NEOPLASMS UNSPEC NATURE BONE SOFT TISSUE&SKIN 05/31/2008   OSTEOPOROSIS 06/10/2006   [place]    Past Surgical History:  Procedure Laterality Date   ABDOMINAL HYSTERECTOMY     APPENDECTOMY     BREAST SURGERY     bx x2   CARDIOVERSION N/A 03/11/2017   Procedure: CARDIOVERSION;  Surgeon: CSanda Klein MD;  [facility]  Service: Cardiovascular;  Laterality: N/A;   CHOLECYSTECTOMY  1980   TRANSFORAMINAL LUMBAR INTERBODY FUSION (TLIF) WITH PEDICLE SCREW FIXATION 1 LEVEL Left 10/27/2018   Procedure: Left Lumbar five-sacral one Transforaminal lumbar interbody fusion;  Surgeon: SErline Levine MD;  [place] [facility]  Service: Neurosurgery;  Laterality: Left;   Family History  Problem Relation Age of Onset   Heart disease Mother        Pacemaker   Heart disease Father    Asthma Father    Diabetes Father    Cancer Maternal Aunt        breast   Heart disease Sister    Hyperlipidemia Sister    Hypertension Sister    Lung cancer Sister    Social History   Socioeconomic History  Marital status: Married    Spouse name: Not on file   Number of children: 4   Years of education: Not on file   Highest education level: Not on file  Occupational History   Not on file  Tobacco Use   Smoking status: Former    Types: Cigarettes   Smokeless tobacco: Former    Types: Snuff    Quit date: 08/10/2021  Vaping Use   Vaping Use: Never used  Substance and Sexual Activity   Alcohol use: No    Alcohol/week: 0.0 standard drinks of alcohol   Drug use: No   Sexual activity: Not Currently  Other Topics Concern   Not on file  Social History Narrative   She is retired    Worked in [employer] for 20 years    Married for 42 years    4 children - live locally.        Social Determinants of Health   Financial Resource Strain: Low Risk  (10/15/2021)   Overall Financial Resource Strain (CARDIA)     Difficulty of Paying Living Expenses: Not hard at all  Food Insecurity: No Food Insecurity (10/15/2021)   Hunger Vital Sign    Worried About Running Out of Food in the Last Year: Never true    Ran Out of Food in the Last Year: Never true  Transportation Needs: No Transportation Needs (10/15/2021)   PRAPARE - Hydrologist (Medical): No    Lack of Transportation (Non-Medical): No  Physical Activity: Inactive (10/15/2021)   Exercise Vital Sign    Days of Exercise per Week: 0 days    Minutes of Exercise per Session: 0 min  Stress: No Stress Concern Present (10/15/2021)   [place]    Feeling of Stress : Not at all  Social Connections: [place] (10/15/2021)   Social Connection and Isolation Panel [NHANES]    Frequency of Communication with Friends and Family: More than three times a week    Frequency of Social Gatherings with Friends and Family: More than three times a week    Attends Religious Services: More than 4 times per year    Active Member of [employer] or Organizations: Yes    Attends Music therapist: More than 4 times per year    Marital Status: Married    Tobacco Counseling Counseling given: Yes   Clinical Intake:  Pre-visit preparation completed: No  Pain : No/denies pain     BMI - recorded: 26.25 Nutritional Status: BMI 25 -29 Overweight Nutritional Risks: None Diabetes: No  How often do you need to have someone help you when you read instructions, pamphlets, or other written materials from your doctor or pharmacy?: 1 - Never  Diabetic?  No  Interpreter Needed?: No  Information entered by :: Rolene Arbour LPN   Activities of Daily Living    10/15/2021    3:25 PM  In your present state of health, do you have any difficulty performing the following activities:  Hearing? 1  Comment Wears hearing aids  Vision? 0  Difficulty concentrating or  making decisions? 0  Walking or climbing stairs? 0  Dressing or bathing? 0  Doing errands, shopping? 0  Preparing Food and eating ? N  Using the Toilet? N  In the past six months, have you accidently leaked urine? Y  Comment Wears depends Followed by PCP  Do you have problems with loss of bowel control? Darreld Mclean  Comment Wears depends followed by PCP  Managing your Medications? N  Managing your Finances? N  Housekeeping or managing your Housekeeping? N    Patient Care Team: [name], NP as PCP - General (Family Medicine) Stanford Breed Denice Bors, MD as PCP - Cardiology (Cardiology) Viona Gilmore, [facility] as Pharmacist (Pharmacist)  Indicate any recent Medical Services you may have received from other than [facility] providers in the past year (date may be approximate).     Assessment:   This is a routine wellness examination for [facility].  Hearing/Vision screen Hearing Screening - Comments:: Wears hearing aids Vision Screening - Comments:: Wears rx glasses - up to date with routine eye exams with  [facility]  Dietary issues and exercise activities discussed: Current Exercise Habits: The patient does not participate in regular exercise at present, Exercise limited by: None identified   Goals Addressed               This Visit's Progress     No current goals (pt-stated)         Depression Screen    10/15/2021    3:17 PM 07/03/2021    8:59 AM 10/02/2020    3:08 PM 11/22/2019    9:59 AM 06/29/2019    7:54 AM 03/11/2018   10:29 AM 03/10/2017    9:16 AM  PHQ 2/9 Scores  PHQ - 2 Score 0 0 0 0 0 0 0  PHQ- 9 Score  3  0       Fall Risk    10/15/2021    3:27 PM 07/03/2021    8:59 AM 10/02/2020    3:10 PM 11/22/2019    9:58 AM 06/29/2019    7:54 AM  [place] in the past year? 0 0 1 0 0  Number falls in past yr: 0 0 1 0   Injury with Fall? 0 0 1 0   Comment   right foot    Risk for fall due to : No Fall Risks No Fall Risks Impaired vision;Impaired balance/gait;Impaired mobility  Impaired balance/gait   Follow up Falls prevention discussed Falls prevention discussed Falls prevention discussed Falls evaluation completed;Falls prevention discussed     FALL RISK PREVENTION PERTAINING TO THE HOME:  Any stairs in or around the home? Yes  If so, are there any without handrails? No  Home free of loose throw rugs in walkways, pet beds, electrical cords, etc? Yes  Adequate lighting in your home to reduce risk of falls? Yes   ASSISTIVE DEVICES UTILIZED TO PREVENT FALLS:  Life alert? No  Use of a cane, walker or w/c? Yes  Grab bars in the bathroom? Yes  Shower chair or bench in shower? No  Elevated toilet seat or a handicapped toilet? Yes   TIMED UP AND GO:  Was the test performed? Yes .  Length of time to ambulate 10 feet: 10 sec.   Gait slow and steady without use of assistive device  Cognitive Function:        10/15/2021    3:27 PM 10/02/2020    3:13 PM 11/22/2019   10:05 AM  6CIT Screen  What Year? 0 points 0 points 0 points  What month? 0 points 0 points 0 points  What time? 0 points 0 points 0 points  Count back from 20 0 points 0 points 0 points  Months in reverse 0 points 4 points 4 points  Repeat phrase 0 points 2 points 8 points  Total  Score 0 points 6 points 12 points    Immunizations Immunization History  Administered Date(s) Administered   Fluad Quad(high Dose 65+) 12/06/2018   Influenza Split 12/15/2010, 12/14/2011, 10/27/2014   Influenza Whole 10/09/2009   Influenza, High Dose Seasonal PF 12/13/2012, 11/04/2014, 10/28/2017, 11/06/2019, 11/08/2020   Influenza,inj,Quad PF,6+ Mos 12/18/2013   Influenza-Unspecified 11/26/2015, 10/30/2016   PFIZER(Purple Top)SARS-COV-2 Vaccination 04/22/2019, 05/13/2019, 11/06/2019   Pfizer Covid-19 Vaccine Bivalent Booster 28yr & up 12/11/2020   Pneumococcal Conjugate-13 05/31/2013   Pneumococcal Polysaccharide-23 06/13/2010   Tdap 06/13/2010      Flu Vaccine status: Up to date  Pneumococcal  vaccine status: Up to date  Covid-19 vaccine status: Completed vaccines  Qualifies for Shingles Vaccine? Yes   Zostavax completed No   Shingrix Completed?: No.    Education has been provided regarding the importance of this vaccine. Patient has been advised to call insurance company to determine out of pocket expense if they have not yet received this vaccine. Advised may also receive vaccine at local pharmacy or Health Dept. Verbalized acceptance and understanding.  Screening Tests Health Maintenance  Topic Date Due   COVID-19 Vaccine (5 - Pfizer risk series) 10/31/2021 (Originally 02/05/2021)   TETANUS/TDAP  01/02/2022 (Originally 06/12/2020)   Zoster Vaccines- Shingrix (1 of 2) 01/02/2022 (Originally 08/03/1950)   INFLUENZA VACCINE  04/26/2022 (Originally 08/26/2021)   Pneumonia Vaccine 88 Years old  Completed   HPV VACCINES  Aged Out   MAMMOGRAM  Discontinued   DEXA SCAN  Discontinued    Health Maintenance  There are no preventive care reminders to display for this patient.   Colorectal cancer screening: No longer required.   Mammogram status: No longer required due to Age.    Lung Cancer Screening: (Low Dose CT Chest recommended if Age 86-80years, 30 pack-year currently smoking OR have quit w/in 15years.) does qualify.   Lung Cancer Screening Referral: Deferred  Additional Screening:  Hepatitis C Screening: does not qualify; Completed   Vision Screening: Recommended annual ophthalmology exams for early detection of glaucoma and other disorders of the eye. Is the patient up to date with their annual eye exam?  Yes  Who is the provider or what is the name of the office in which the patient attends annual eye exams[facility] If pt is not established with a provider, would they like to be referred to a provider to establish care? No .   Dental Screening: Recommended annual dental exams for proper oral hygiene  Community Resource Referral / Chronic Care  Management:  CRR required this visit?  No   CCM required this visit?  No      Plan:     I have personally reviewed and noted the following in the patient's chart:   Medical and social history Use of alcohol, tobacco or illicit drugs  Current medications and supplements including opioid prescriptions. Patient is not currently taking opioid prescriptions. Functional ability and status Nutritional status Physical activity Advanced directives List of other physicians Hospitalizations, surgeries, and ER visits in previous 12 months Vitals Screenings to include cognitive, depression, and falls Referrals and appointments  In addition, I have reviewed and discussed with patient certain preventive protocols, quality metrics, and best practice recommendations. A written personalized care plan for preventive services as well as general preventive health recommendations were provided to patient.     BCriselda Peaches LPN   [id number]  Nurse Notes: None

## 2021-10-15 NOTE — Patient Instructions (Addendum)
Tammy [name] , Thank you for taking time to come for your Medicare Wellness Visit. I appreciate your ongoing commitment to your health goals. Please review the following plan we discussed and let me know if I can assist you in the future.   Screening recommendations/referrals: Colonoscopy: No longer required Mammogram: No longer required  Recommended yearly ophthalmology/optometry visit for glaucoma screening and checkup Recommended yearly dental visit for hygiene and checkup  Vaccinations: Influenza vaccine: Up to date Pneumococcal vaccine: Up to date Shingles vaccine: Deferred   Covid-19:Done  Advanced directives: Advance directive discussed with you today. Even though you declined this today, please call our office should you change your mind, and we can give you the proper paperwork for you to fill out.   Conditions/risks identified: None  Next appointment: Follow up in one year for your annual wellness visit    Preventive Care 65 Years and Older, Female Preventive care refers to lifestyle choices and visits with your health care provider that can promote health and wellness. What does preventive care include? A yearly physical exam. This is also called an annual well check. Dental exams once or twice a year. Routine eye exams. Ask your health care provider how often you should have your eyes checked. Personal lifestyle choices, including: Daily care of your teeth and gums. Regular physical activity. Eating a healthy diet. Avoiding tobacco and drug use. Limiting alcohol use. Practicing safe sex. Taking low-dose aspirin every day. Taking vitamin and mineral supplements as recommended by your health care provider. What happens during an annual well check? The services and screenings done by your health care provider during your annual well check will depend on your age, overall health, lifestyle risk factors, and family history of disease. Counseling  Your health care provider  may ask you questions about your: Alcohol use. Tobacco use. Drug use. Emotional well-being. Home and relationship well-being. Sexual activity. Eating habits. History of falls. Memory and ability to understand (cognition). Work and work Statistician. Reproductive health. Screening  You may have the following tests or measurements: Height, weight, and BMI. Blood pressure. Lipid and cholesterol levels. These may be checked every 5 years, or more frequently if you are over 67 years old. Skin check. Lung cancer screening. You may have this screening every year starting at age 1 if you have a 30-pack-year history of smoking and currently smoke or have quit within the past 15 years. Fecal occult blood test (FOBT) of the stool. You may have this test every year starting at age 16. Flexible sigmoidoscopy or colonoscopy. You may have a sigmoidoscopy every 5 years or a colonoscopy every 10 years starting at age 53. Hepatitis C blood test. Hepatitis B blood test. Sexually transmitted disease (STD) testing. Diabetes screening. This is done by checking your blood sugar (glucose) after you have not eaten for a while (fasting). You may have this done every 1-3 years. Bone density scan. This is done to screen for osteoporosis. You may have this done starting at age 61. Mammogram. This may be done every 1-2 years. Talk to your health care provider about how often you should have regular mammograms. Talk with your health care provider about your test results, treatment options, and if necessary, the need for more tests. Vaccines  Your health care provider may recommend certain vaccines, such as: Influenza vaccine. This is recommended every year. Tetanus, diphtheria, and acellular pertussis (Tdap, Td) vaccine. You may need a Td booster every 10 years. Zoster vaccine. You may need this after  age 32. Pneumococcal 13-valent conjugate (PCV13) vaccine. One dose is recommended after age 36. Pneumococcal  polysaccharide (PPSV23) vaccine. One dose is recommended after age 28. Talk to your health care provider about which screenings and vaccines you need and how often you need them. This information is not intended to replace advice given to you by your health care provider. Make sure you discuss any questions you have with your health care provider. Document Released: 02/08/2015 Document Revised: 10/02/2015 Document Reviewed: 11/13/2014 Elsevier Interactive Patient Education  2017 [place] Prevention in the Home Falls can cause injuries. They can happen to people of all ages. There are many things you can do to make your home safe and to help prevent falls. What can I do on the outside of my home? Regularly fix the edges of walkways and driveways and fix any cracks. Remove anything that might make you trip as you walk through a door, such as a raised step or threshold. Trim any bushes or trees on the path to your home. Use bright outdoor lighting. Clear any walking paths of anything that might make someone trip, such as rocks or tools. Regularly check to see if handrails are loose or broken. Make sure that both sides of any steps have handrails. Any raised decks and porches should have guardrails on the edges. Have any leaves, snow, or ice cleared regularly. Use sand or salt on walking paths during winter. Clean up any spills in your garage right away. This includes oil or grease spills. What can I do in the bathroom? Use night lights. Install grab bars by the toilet and in the tub and shower. Do not use towel bars as grab bars. Use non-skid mats or decals in the tub or shower. If you need to sit down in the shower, use a plastic, non-slip stool. Keep the floor dry. Clean up any water that spills on the floor as soon as it happens. Remove soap buildup in the tub or shower regularly. Attach bath mats securely with double-sided non-slip rug tape. Do not have throw rugs and other  things on the floor that can make you trip. What can I do in the bedroom? Use night lights. Make sure that you have a light by your bed that is easy to reach. Do not use any sheets or blankets that are too big for your bed. They should not hang down onto the floor. Have a firm chair that has side arms. You can use this for support while you get dressed. Do not have throw rugs and other things on the floor that can make you trip. What can I do in the kitchen? Clean up any spills right away. Avoid walking on wet floors. Keep items that you use a lot in easy-to-reach places. If you need to reach something above you, use a strong step stool that has a grab bar. Keep electrical cords out of the way. Do not use floor polish or wax that makes floors slippery. If you must use wax, use non-skid floor wax. Do not have throw rugs and other things on the floor that can make you trip. What can I do with my stairs? Do not leave any items on the stairs. Make sure that there are handrails on both sides of the stairs and use them. Fix handrails that are broken or loose. Make sure that handrails are as long as the stairways. Check any carpeting to make sure that it is firmly attached to the stairs.  Fix any carpet that is loose or worn. Avoid having throw rugs at the top or bottom of the stairs. If you do have throw rugs, attach them to the floor with carpet tape. Make sure that you have a light switch at the top of the stairs and the bottom of the stairs. If you do not have them, ask someone to add them for you. What else can I do to help prevent falls? Wear shoes that: Do not have high heels. Have rubber bottoms. Are comfortable and fit you well. Are closed at the toe. Do not wear sandals. If you use a stepladder: Make sure that it is fully opened. Do not climb a closed stepladder. Make sure that both sides of the stepladder are locked into place. Ask someone to hold it for you, if possible. Clearly  mark and make sure that you can see: Any grab bars or handrails. First and last steps. Where the edge of each step is. Use tools that help you move around (mobility aids) if they are needed. These include: Canes. Walkers. Scooters. Crutches. Turn on the lights when you go into a dark area. Replace any light bulbs as soon as they burn out. Set up your furniture so you have a clear path. Avoid moving your furniture around. If any of your floors are uneven, fix them. If there are any pets around you, be aware of where they are. Review your medicines with your doctor. Some medicines can make you feel dizzy. This can increase your chance of falling. Ask your doctor what other things that you can do to help prevent falls. This information is not intended to replace advice given to you by your health care provider. Make sure you discuss any questions you have with your health care provider. Document Released: 11/08/2008 Document Revised: 06/20/2015 Document Reviewed: 02/16/2014 Elsevier Interactive Patient Education  2017 [employer].

## 2021-10-21 DIAGNOSIS — D3131 Benign neoplasm of right choroid: Secondary | ICD-10-CM | POA: Diagnosis not present

## 2021-11-04 ENCOUNTER — Other Ambulatory Visit: Payer: Self-pay | Admitting: Cardiology

## 2021-11-04 DIAGNOSIS — N289 Disorder of kidney and ureter, unspecified: Secondary | ICD-10-CM

## 2021-11-10 ENCOUNTER — Telehealth: Payer: Self-pay | Admitting: Pharmacist

## 2021-11-10 IMAGING — CT CT CHEST W/O CM
1 series · 15 of 34 positions shown, 19 images · non-contrast
Comparison: Radiograph 04/19/2019 and chest CT from 02/25/2014

CLINICAL DATA: Right middle lobe interstitial abnormalities on
chest radiography of 04/19/2019, for further investigation

EXAM:
CT CHEST WITHOUT CONTRAST
TECHNIQUE: Multidetector CT imaging of the chest was performed following the
standard protocol without IV contrast.

[Series 6: chest w/(date) · axial · 0.62mm/px · z∈[+724,+974]mm · 15 of 147 slices shown, 19 images]
[im 11/147  mediastinal]
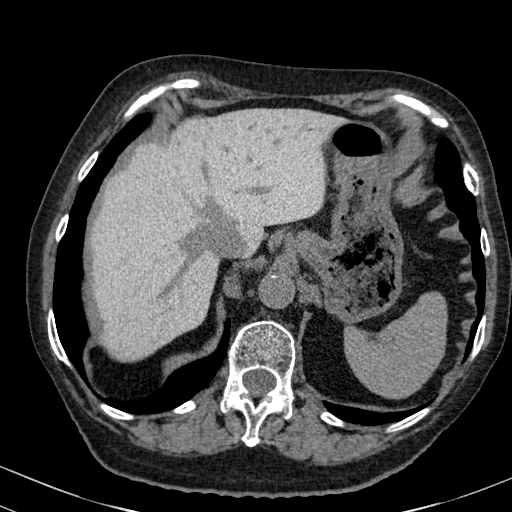
[im 11/147  lung]
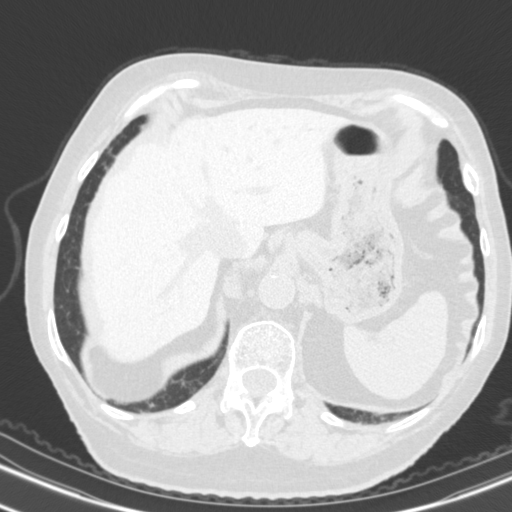
[im 22/147  lung]
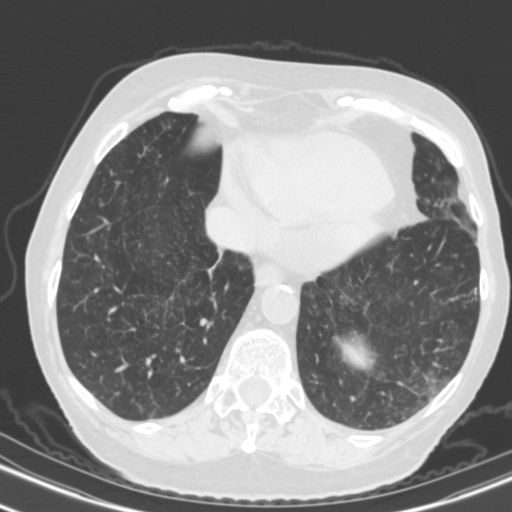
[im 30/147  lung]
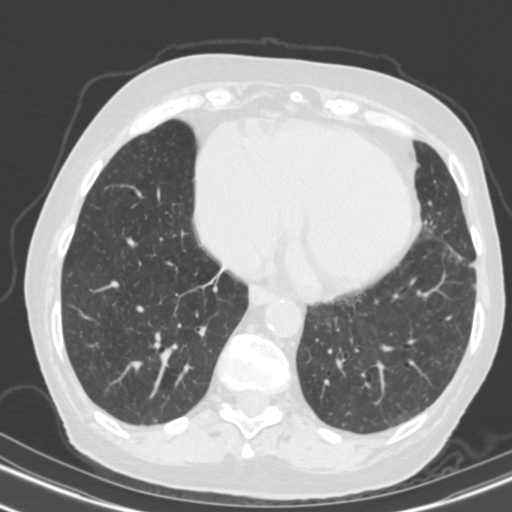
[im 38/147  lung]
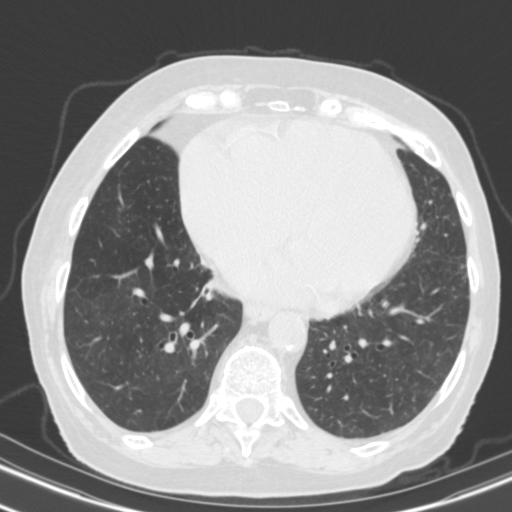
[im 49/147  mediastinal]
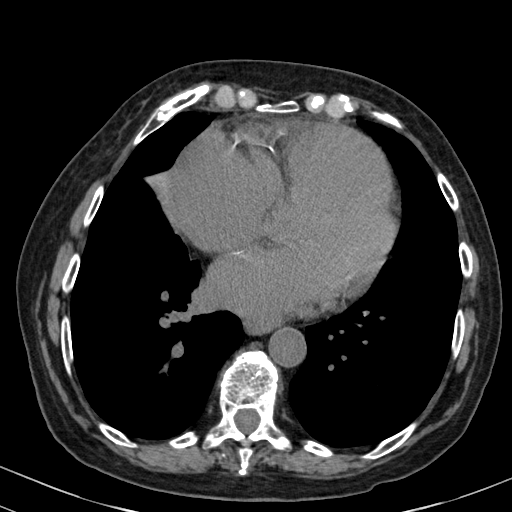
[im 49/147  lung]
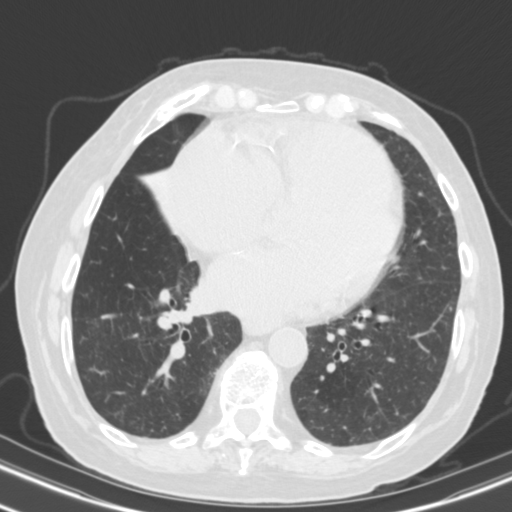
[im 59/147  lung]
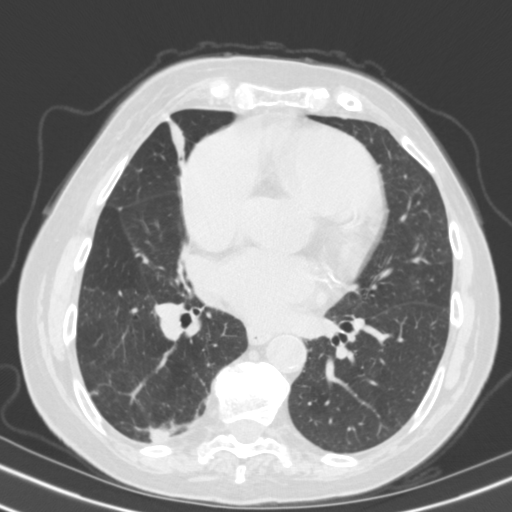
[im 65/147  lung]
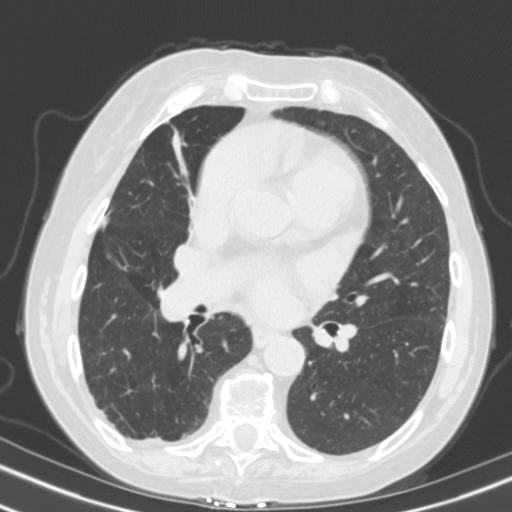
[im 76/147  lung]
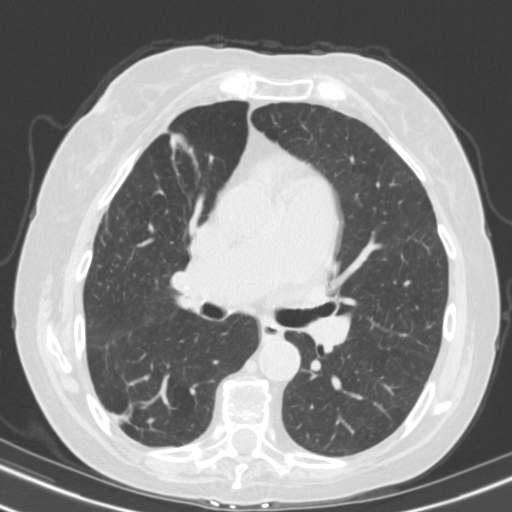
[im 82/147  mediastinal]
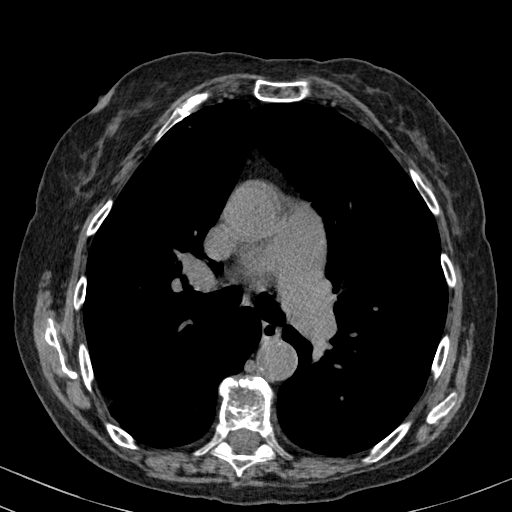
[im 82/147  lung]
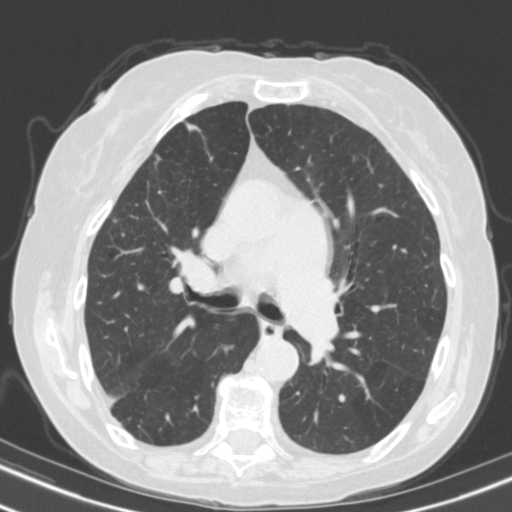
[im 88/147  lung]
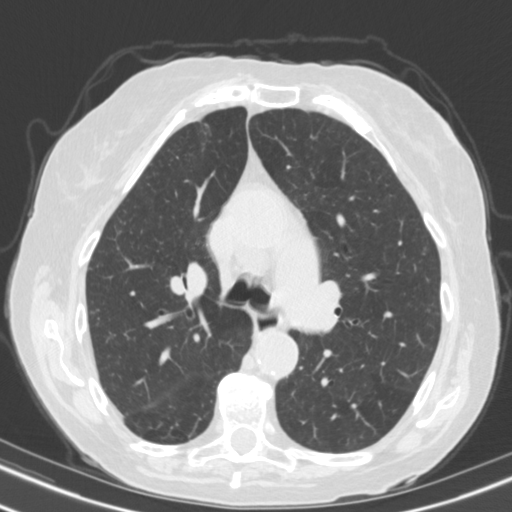
[im 98/147  lung]
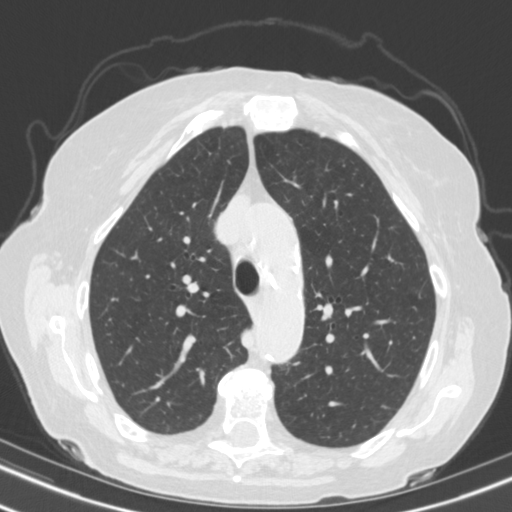
[im 109/147  lung]
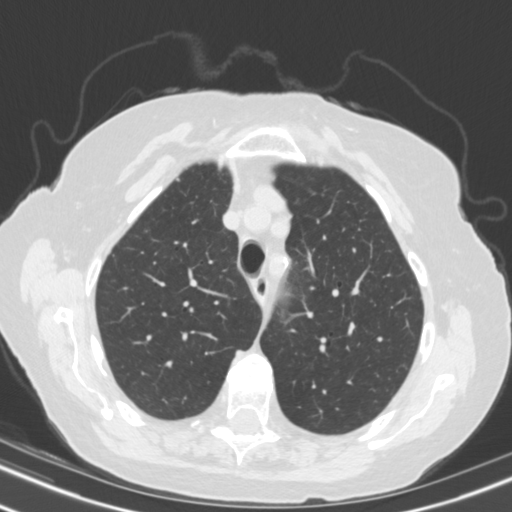
[im 117/147  mediastinal]
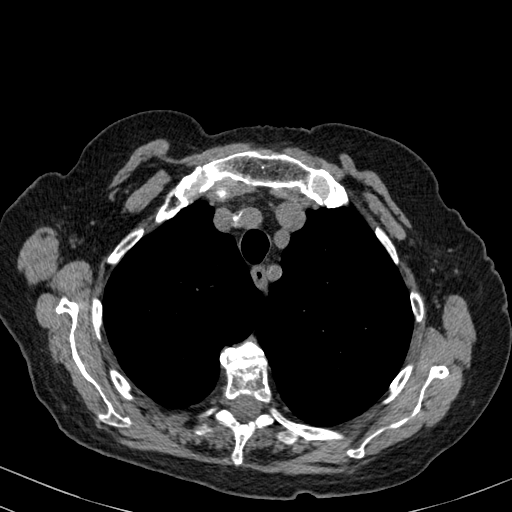
[im 117/147  lung]
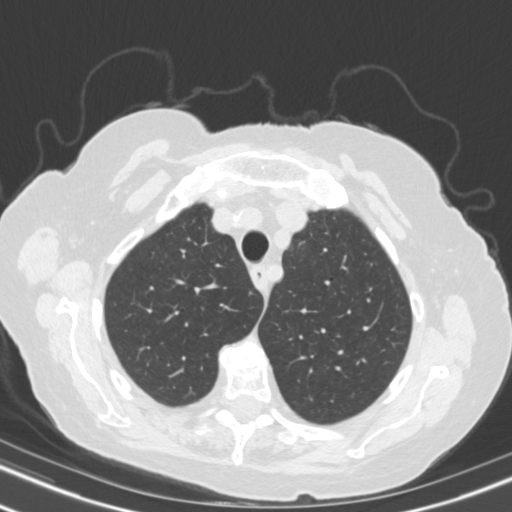
[im 125/147  lung]
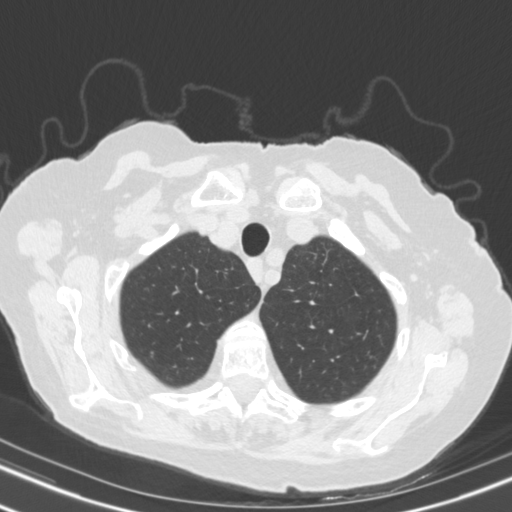
[im 136/147  lung]
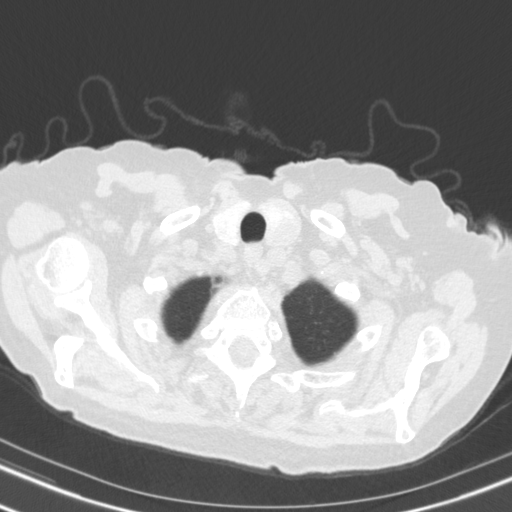

[15 of 34 positions shown; findings below may reference images not displayed]

FINDINGS: Cardiovascular: Coronary, aortic arch, and branch vessel
atherosclerotic vascular disease. Moderate cardiomegaly.

Mediastinum/Nodes: Lateral left thyroid nodule is hypodense and
measures 1.9 by 1.4 cm no image [DATE], stable from 02/25/2014.
Stability for greater than 5 years implies benignity; no biopsy or
followup indicated (ref: [HOSPITAL]. [DATE]): 143-50).

No pathologic adenopathy identified.

Lungs/Pleura: Volume loss and probable scarring in the right middle
lobe and in the lower anterior portion of the right upper lobe,
without a well-defined central obstructing lesion. The right middle
lobe volume loss was also present on 02/25/2014 although the upper
lobe volume loss has increased since that time.

There some scarring posteriorly in the superior segment right lower
lobe.

Speckled calcifications along the lower portion of the left major
fissure. Subtle reticulonodular opacities in the lung bases
potentially from hypersensitivity pneumonitis or subtle atypical
pneumonia.

Upper Abdomen: High-density a hepatic parenchyma with a region of
interest having mean precontrast density of 108 Hounsfield units,
favoring hemochromatosis.

Musculoskeletal: Old healed bilateral rib fractures. Old healed
upper sternal body fracture. Thoracic spondylosis.
IMPRESSION: 1. Volume loss and probable scarring in the right middle lobe and in
the lower anterior portion of the right upper lobe, without a
well-defined central obstructing lesion.
2. Subtle reticulonodular opacities in the lung bases potentially
from hypersensitivity pneumonitis or subtle atypical pneumonia.
3. Moderate cardiomegaly.
4. Coronary, aortic arch, and branch vessel atherosclerotic vascular
disease.
5. High-density hepatic parenchyma favoring hemochromatosis.

Aortic Atherosclerosis ([id number]).

## 2021-11-10 NOTE — Progress Notes (Signed)
    Chronic Care Management Pharmacy Assistant   Name: Tammy [name]  MRN: [id number] DOB: 05/06/1931  Reason for Encounter: Disease State   Conditions to be addressed/monitored: HTN  Recent office visits:  10/15/21 [name], LPN - Patient presented for [facility] Annual Wellness exam. No medication changes.  Recent consult visits:  None  Hospital visits:  None in previous 6 months  Medications: Outpatient Encounter Medications as of 11/10/2021  Medication Sig   acetaminophen (TYLENOL) 650 MG CR tablet Take 1,300 mg by mouth every 8 (eight) hours as needed for pain.   amiodarone (PACERONE) 200 MG tablet TAKE ONE TABLET TWICE DAILY FOR ONE WEEK THEN DECREASE TO 1 TABLET DAILY   amLODipine (NORVASC) 10 MG tablet TAKE 1 TABLET BY MOUTH EVERY DAY   apixaban (ELIQUIS) 2.5 MG TABS tablet TAKE 1 TABLET BY MOUTH TWICE A DAY   atorvastatin (LIPITOR) 20 MG tablet TAKE 1 TABLET BY MOUTH DAILY AT 6 PM.   furosemide (LASIX) 40 MG tablet TAKE 1 TABLET BY MOUTH EVERY DAY   levothyroxine (SYNTHROID) 25 MCG tablet TAKE 1 TABLET BY MOUTH EVERY DAY   No facility-administered encounter medications on file as of 11/10/2021.  Reviewed chart prior to disease state call. Spoke with patient regarding BP  Recent Office Vitals: BP Readings from Last 3 Encounters:  10/15/21 122/62  07/03/21 128/60  05/15/21 (!) 148/62   Pulse Readings from Last 3 Encounters:  10/15/21 60  07/03/21 (!) 52  05/15/21 80    Wt Readings from Last 3 Encounters:  10/15/21 167 lb 9.6 oz (76 kg)  07/03/21 164 lb 3.2 oz (74.5 kg)  05/15/21 163 lb 6.4 oz (74.1 kg)     Kidney Function Lab Results  Component Value Date/Time   CREATININE 2.00 (H) 07/03/2021 09:28 AM   CREATININE 1.79 (H) 05/15/2021 09:12 AM   CREATININE 1.10 (H) 11/05/2014 11:51 AM   CREATININE 1.08 04/13/2014 09:02 AM   GFR 21.99 (L) 07/03/2021 09:28 AM   GFRNONAA 29 (L) 01/08/2020 12:24 PM   GFRNONAA 56 (L) 03/30/2014 09:31 AM   GFRAA 33 (L)  01/08/2020 12:24 PM   GFRAA 65 03/30/2014 09:31 AM       Latest Ref Rng & Units 07/03/2021    9:28 AM 05/15/2021    9:12 AM 10/23/2020   10:15 AM  BMP  Glucose 70 - 99 mg/dL 127  126  139   BUN 6 - 23 mg/dL 49  48  33   Creatinine 0.40 - 1.20 mg/dL 2.00  1.79  1.63   BUN/Creat Ratio 12 - '28  27  20   '$ Sodium 135 - 145 mEq/L 141  141  147   Potassium 3.5 - 5.1 mEq/L 4.7  4.8  4.2   Chloride 96 - 112 mEq/L 105  106  107   CO2 19 - 32 mEq/L '22  21  19   '$ Calcium 8.4 - 10.5 mg/dL 9.6  9.7  9.2     Current antihypertensive regimen:  Amlodipine 10 mg 1 tablet daily Unable to reach for assesment    Care Gaps: COVID Booster - Overdue TDAP - Postponed Zoster Vaccine - Postponed CCM- 12/23 BP- 128/60 6/23 AWV- 9/23  Star Rating Drugs: Atorvastatin 20 mg - Last filled 10/16/21 90 DS at [facility] Pharmacist Assistant [phone]

## 2021-11-25 DIAGNOSIS — M48062 Spinal stenosis, lumbar region with neurogenic claudication: Secondary | ICD-10-CM | POA: Diagnosis not present

## 2021-11-25 DIAGNOSIS — M5416 Radiculopathy, lumbar region: Secondary | ICD-10-CM | POA: Diagnosis not present

## 2021-12-03 DIAGNOSIS — M545 Low back pain, unspecified: Secondary | ICD-10-CM | POA: Diagnosis not present

## 2021-12-03 DIAGNOSIS — M5416 Radiculopathy, lumbar region: Secondary | ICD-10-CM | POA: Diagnosis not present

## 2021-12-03 DIAGNOSIS — M47816 Spondylosis without myelopathy or radiculopathy, lumbar region: Secondary | ICD-10-CM | POA: Diagnosis not present

## 2021-12-03 DIAGNOSIS — M5126 Other intervertebral disc displacement, lumbar region: Secondary | ICD-10-CM | POA: Diagnosis not present

## 2021-12-23 DIAGNOSIS — M47816 Spondylosis without myelopathy or radiculopathy, lumbar region: Secondary | ICD-10-CM | POA: Diagnosis not present

## 2021-12-31 DIAGNOSIS — M47816 Spondylosis without myelopathy or radiculopathy, lumbar region: Secondary | ICD-10-CM | POA: Diagnosis not present

## 2022-01-06 ENCOUNTER — Telehealth: Payer: Self-pay | Admitting: Pharmacist

## 2022-01-06 NOTE — Chronic Care Management (AMB) (Signed)
    Chronic Care Management Pharmacy Assistant   Name: Tammy [name]  MRN: [id number] DOB: [birth year]  01/06/22 APPOINTMENT REMINDER    Patient  was reminded to have all medications, supplements and any blood glucose and blood pressure readings available for review with Jeni Salles, Pharm. D, for telephone visit on 01/07/22 at 1.    Care Gaps: Zoster Vaccine - Overdue TDAP - Overdue COVID Booster - Overdue AWV - 10/15/21  Star Rating Drug: Atorvastatin 20 mg - Last filled 10/16/21 90 DS at [facility]     Medications: Outpatient Encounter Medications as of 01/06/2022  Medication Sig   acetaminophen (TYLENOL) 650 MG CR tablet Take 1,300 mg by mouth every 8 (eight) hours as needed for pain.   amiodarone (PACERONE) 200 MG tablet TAKE ONE TABLET TWICE DAILY FOR ONE WEEK THEN DECREASE TO 1 TABLET DAILY   amLODipine (NORVASC) 10 MG tablet TAKE 1 TABLET BY MOUTH EVERY DAY   apixaban (ELIQUIS) 2.5 MG TABS tablet TAKE 1 TABLET BY MOUTH TWICE A DAY   atorvastatin (LIPITOR) 20 MG tablet TAKE 1 TABLET BY MOUTH DAILY AT 6 PM.   furosemide (LASIX) 40 MG tablet TAKE 1 TABLET BY MOUTH EVERY DAY   levothyroxine (SYNTHROID) 25 MCG tablet TAKE 1 TABLET BY MOUTH EVERY DAY   No facility-administered encounter medications on file as of 01/06/2022.      [place] [facility] Pharmacist Assistant [phone]

## 2022-01-07 ENCOUNTER — Ambulatory Visit (INDEPENDENT_AMBULATORY_CARE_PROVIDER_SITE_OTHER): Payer: Medicare HMO | Admitting: Pharmacist

## 2022-01-07 DIAGNOSIS — E785 Hyperlipidemia, unspecified: Secondary | ICD-10-CM

## 2022-01-07 DIAGNOSIS — I10 Essential (primary) hypertension: Secondary | ICD-10-CM

## 2022-01-07 NOTE — Progress Notes (Signed)
Chronic Care Management Pharmacy Note  01/07/2022 Name:  Tammy [name] MRN:  [id number] DOB:  04-13-1931  Summary: BP is at goal < 140/90 Pt is having a lot more back pain lately  Recommendations/Changes made from today's visit: -Recommended to bring BP cuff to next office visit to ensure accuracy -Recommended checking vitamin D level and consider supplementation based on her level -Recommended trial of Voltaren gel if arthritis pain worsens  Plan: Follow up as needed   Subjective: [name] is an 88 y.o. year old female who is a primary patient of [name], NP.  The CCM team was consulted for assistance with disease management and care coordination needs.    Engaged with patient by telephone for follow up visit in response to provider referral for pharmacy case management and/or care coordination services.   Consent to Services:  The patient was given information about Chronic Care Management services, agreed to services, and gave verbal consent prior to initiation of services.  Please see initial visit note for detailed documentation.   Patient Care Team: [name], NP as PCP - General (Family Medicine) Stanford Breed Denice Bors, MD as PCP - Cardiology (Cardiology) Viona Gilmore, [facility] as Pharmacist (Pharmacist)  Recent office visits: 10/15/21 [name], LPN - Patient presented for Medicare Annual Wellness exam. No medication changes.   07/03/21 [name], NP: Patient presented for annual exam. No medication changes.  Recent consult visits: 12/31/21 Lenord Carbo, MD (neurosurgery): Patient presented for  lumbar facet arthropathy. Unable to access notes.  10/21/21 Darcey Nora (optometry): Patient presented for eye exam. Unable to access notes.  Hospital visits: None in previous 6 months   Objective:  Lab Results  Component Value Date   CREATININE 2.00 (H) 07/03/2021   BUN 49 (H) 07/03/2021   GFR 21.99 (L) 07/03/2021   GFRNONAA 29 (L) 01/08/2020    GFRAA 33 (L) 01/08/2020   NA 141 07/03/2021   K 4.7 07/03/2021   CALCIUM 9.6 07/03/2021   CO2 22 07/03/2021   GLUCOSE 127 (H) 07/03/2021    Lab Results  Component Value Date/Time   HGBA1C 6.4 07/03/2021 09:28 AM   HGBA1C 5.9 (A) 01/02/2020 09:30 AM   HGBA1C 6.5 06/29/2019 08:21 AM   GFR 21.99 (L) 07/03/2021 09:28 AM   GFR 27.69 (L) 07/02/2020 09:27 AM    Last diabetic Eye exam: No results found for: "HMDIABEYEEXA"  Last diabetic Foot exam: No results found for: "HMDIABFOOTEX"   Lab Results  Component Value Date   CHOL 140 07/03/2021   HDL 61.70 07/03/2021   LDLCALC 58 07/03/2021   TRIG 103.0 07/03/2021   CHOLHDL 2 07/03/2021       Latest Ref Rng & Units 07/03/2021    9:28 AM 07/02/2020    9:27 AM 10/30/2019   11:33 AM  Hepatic Function  Total Protein 6.0 - 8.3 g/dL 7.6  7.5  7.3   Albumin 3.5 - 5.2 g/dL 4.0  4.3  4.1   AST 0 - 37 U/L 23  27  36   ALT 0 - 35 U/L _0 Alk Phosphatase 39 - 117 U/L 86  105  103   Total Bilirubin 0.2 - 1.2 mg/dL 0.7  0.7  0.6   Bilirubin, Direct 0.00 - 0.40 mg/dL   0.20     Lab Results  Component Value Date/Time   TSH 2.88 07/03/2021 09:28 AM   TSH 2.30 07/02/2020 09:27 AM  Latest Ref Rng & Units 07/03/2021    9:28 AM 07/02/2020    9:27 AM 12/18/2019    9:12 AM  CBC  WBC 4.0 - 10.5 K/uL 8.2  8.4  7.1   Hemoglobin 12.0 - 15.0 g/dL 13.6  13.2  13.3   Hematocrit 36.0 - 46.0 % 41.7  38.9  39.3   Platelets 150.0 - 400.0 K/uL 286.0  300.0  250     Lab Results  Component Value Date/Time   VD25OH 34.25 07/03/2021 09:28 AM    Clinical ASCVD: No  The ASCVD Risk score (Arnett DK, et al., 2019) failed to calculate for the following reasons:   The 2019 ASCVD risk score is only valid for ages 86 to 89       10/15/2021    3:17 PM 07/03/2021    8:59 AM 10/02/2020    3:08 PM  Depression screen PHQ 2/9  Decreased Interest 0 0 0  Down, Depressed, Hopeless 0 0 0  PHQ - 2 Score 0 0 0  Altered sleeping  0   Tired, decreased energy   3   Change in appetite  0   Feeling bad or failure about yourself   0   Trouble concentrating  0   Moving slowly or fidgety/restless  0   Suicidal thoughts  0   PHQ-9 Score  3   Difficult doing work/chores  Not difficult at all      CHA2DS2/VAS Stroke Risk Points  Current as of 11 days ago     6 >= 2 Points: High Risk  1 - 1.99 Points: Medium Risk  0 Points: Low Risk    Last Change: N/A      Details    This score determines the patient's risk of having a stroke if the  patient has atrial fibrillation.       Points Metrics  0 Has Congestive Heart Failure:  No    Current as of 11 days ago  0 Has Vascular Disease:  No    Current as of 11 days ago  1 Has Hypertension:  Yes    Current as of 11 days ago  2 Age:  45    Current as of 11 days ago  0 Has Diabetes:  No    Current as of 11 days ago  2 Had Stroke:  No  Had TIA:  No  Had Thromboembolism:  Yes     Current as of 11 days ago  1 Female:  Yes    Current as of 11 days ago     Social History   Tobacco Use  Smoking Status Former   Types: Cigarettes  Smokeless Tobacco Former   Types: Snuff   Quit date: 08/10/2021   BP Readings from Last 3 Encounters:  10/15/21 122/62  07/03/21 128/60  05/15/21 (!) 148/62   Pulse Readings from Last 3 Encounters:  10/15/21 60  07/03/21 (!) 52  05/15/21 80   Wt Readings from Last 3 Encounters:  10/15/21 167 lb 9.6 oz (76 kg)  07/03/21 164 lb 3.2 oz (74.5 kg)  05/15/21 163 lb 6.4 oz (74.1 kg)   BMI Readings from Last 3 Encounters:  10/15/21 26.25 kg/m  07/03/21 25.72 kg/m  05/15/21 25.59 kg/m    Assessment/Interventions: Review of patient past medical history, allergies, medications, health status, including review of consultants reports, laboratory and other test data, was performed as part of comprehensive evaluation and provision of chronic care management services.   SDOH:  (  Social Determinants of Health) assessments and interventions performed: Yes  SDOH  Interventions    Flowsheet Row Chronic Care Management from 01/07/2022 in [place] [facility] [place] from 10/15/2021 in [place] [facility] [place] from 11/22/2019 in [place] [facility] [place] Interventions -- Intervention Not Indicated Intervention Not Indicated  Housing Interventions -- Intervention Not Indicated Intervention Not Indicated  Transportation Interventions -- Intervention Not Indicated Intervention Not Indicated  Utilities Interventions -- Intervention Not Indicated --  Alcohol Usage Interventions -- Intervention Not Indicated (Score <7) --  Depression Interventions/Treatment  -- -- PHQ2-9 Score <4 Follow-up Not Indicated  Financial Strain Interventions Intervention Not Indicated Intervention Not Indicated Intervention Not Indicated  Physical Activity Interventions -- Intervention Not Indicated Intervention Not Indicated  Stress Interventions -- Intervention Not Indicated Intervention Not Indicated  Social Connections Interventions -- Intervention Not Indicated Intervention Not Indicated      SDOH Screenings   Food Insecurity: No Food Insecurity (10/15/2021)  Housing: Low Risk  (10/15/2021)  Transportation Needs: No Transportation Needs (10/15/2021)  Utilities: Not At Risk (10/15/2021)  Alcohol Screen: Low Risk  (10/15/2021)  Depression (PHQ2-9): Low Risk  (10/15/2021)  Financial Resource Strain: Low Risk  (01/07/2022)  Physical Activity: Inactive (10/15/2021)  Social Connections: Socially Integrated (10/15/2021)  Stress: No Stress Concern Present (10/15/2021)  Tobacco Use: Medium Risk (10/15/2021)    [place]  Allergies  Allergen Reactions   Tuberculin Tests Other (See Comments)    Tuberculosis shot, red spot on arm    Medications Reviewed Today     Reviewed by Viona Gilmore, [facility] (Pharmacist) on 01/07/22 at 1305  Med List Status: <None>   Medication Order Taking? Sig  Documenting Provider Last Dose Status Informant  acetaminophen (TYLENOL) 650 MG CR tablet [id number] Yes Take 1,300 mg by mouth every 8 (eight) hours as needed for pain. [provider] Taking Active Spouse/Significant Other  amiodarone (PACERONE) 200 MG tablet [id number]  TAKE ONE TABLET TWICE DAILY FOR ONE WEEK THEN DECREASE TO 1 TABLET DAILY Lelon Perla, MD  Active   amLODipine (NORVASC) 10 MG tablet [id number]  TAKE 1 TABLET BY MOUTH EVERY DAY Nafziger, Tommi Rumps, NP  Active   apixaban (ELIQUIS) 2.5 MG TABS tablet [id number]  TAKE 1 TABLET BY MOUTH TWICE A DAY Stanford Breed Denice Bors, MD  Active   atorvastatin (LIPITOR) 20 MG tablet [id number]  TAKE 1 TABLET BY MOUTH DAILY AT 6 PM. [name], NP  Active   furosemide (LASIX) 40 MG tablet [id number]  TAKE 1 TABLET BY MOUTH EVERY DAY Lelon Perla, MD  Active   levothyroxine (SYNTHROID) 25 MCG tablet [id number]  TAKE 1 TABLET BY MOUTH EVERY DAY [name], NP  Active             Patient Active Problem List   Diagnosis Date Noted   Spondylolisthesis of lumbar region 10/27/2018   Atrial fibrillation ([place]) 03/21/2014   Embolism, pulmonary with infarction ([place]) 03/08/2014   Severe tricuspid regurgitation by prior echocardiogram 02/23/2014   Pulmonary hypertension ([place]) 02/23/2014   Syncope and collapse 02/23/2014   Pain in limb 06/07/2013   PAD (peripheral artery disease) ([place]) 06/01/2013   Syncope 04/02/2012   NEOPLASMS UNSPEC NATURE BONE SOFT TISSUE&SKIN 05/31/2008   Dyslipidemia 06/10/2006   Essential hypertension 06/10/2006   Osteoporosis 06/10/2006   BREAST BIOPSY, HX OF 06/10/2006    Immunization History  Administered Date(s) Administered   Fluad Quad(high Dose 65+) 12/06/2018  Influenza Split 12/15/2010, 12/14/2011, 10/27/2014   Influenza Whole 10/09/2009   Influenza, High Dose Seasonal PF 12/13/2012, 11/04/2014, 10/28/2017, 11/06/2019, 11/08/2020   Influenza,inj,Quad PF,6+ Mos 12/18/2013   Influenza-Unspecified  11/26/2015, 10/30/2016   PFIZER(Purple Top)SARS-COV-2 Vaccination 04/22/2019, 05/13/2019, 11/06/2019   Pfizer Covid-19 Vaccine Bivalent Booster 70yr & up 12/11/2020   Pneumococcal Conjugate-13 05/31/2013   Pneumococcal Polysaccharide-23 06/13/2010   Tdap 06/13/2010   Patient reports her pain is worse when she is walking around but not when she is sitting. She never had a steroid injection before and is hoping the epidural will help. She always has pain around Christmas time - she had a crushed vertebrae 3 years ago around this same time.  Patient has been checking her BP and it runs pretty much the same at home and higher in the doctor's office. She has not noticed an increase in her blood pressure with the increased pain.  Conditions to be addressed/monitored:  Hypertension, Hyperlipidemia, Atrial Fibrillation, Hypothyroidism, Osteoporosis, and Prediabetes and Pulmonary hypertension  Conditions addressed this visit: Hypertension, Afib, osteoporosis  [place] : [place] Updates made by PViona Gilmore RAvocasince 01/07/2022 12:00 AM     Problem: Problem: Hypertension, Hyperlipidemia, Atrial Fibrillation, Hypothyroidism, Osteoporosis, and Prediabetes and Pulmonary hypertension      Long-Range Goal: Patient-Specific Goal   Start Date: 07/10/2020  Expected End Date: 07/10/2021  Recent Progress: On track  Priority: High  Note:   Current Barriers:  Unable to independently monitor therapeutic efficacy Suboptimal therapeutic regimen for osteoporosis  Pharmacist Clinical Goal(s):  Patient will achieve adherence to monitoring guidelines and medication adherence to achieve therapeutic efficacy achieve control of blood pressure as evidenced by home BP readings adhere to plan to optimize therapeutic regimen for osteoporosis as evidenced by report of adherence to recommended medication management changes through collaboration with PharmD and provider.   Interventions: 1:1  collaboration with NDorothyann Peng NP regarding development and update of comprehensive plan of care as evidenced by provider attestation and co-signature Inter-disciplinary care team collaboration (see longitudinal plan of care) Comprehensive medication review performed; medication list updated in electronic medical record  Hypertension (BP goal <140/90) -Controlled -Current treatment: Amlodipine 10 mg 1 tablet daily - Appropriate, Effective, Safe, Accessible -Medications previously tried: diltiazem (unknown), HCTZ  -Current home readings: 125/94 (doctor's office), 127/55, 120/52, 128/60 -Current dietary habits: patient has been looking at package labels for sodium and is trying to stick with the 1 L recommendation of fluid each day -Current exercise habits: not active -Denies hypotensive/hypertensive symptoms -Educated on Importance of home blood pressure monitoring; Proper BP monitoring technique; Symptoms of hypotension and importance of maintaining adequate hydration; -Counseled to monitor BP at home weekly, document, and provide log at future appointments -Counseled on diet and exercise extensively Recommended to continue current medication  Hyperlipidemia: (LDL goal < 70) -Controlled -Current treatment: Atorvastatin 20 mg 1 tablet daily - Appropriate, Effective, Safe, Accessible -Medications previously tried: simvastatin (unknown)  -Current dietary patterns: did not discuss -Current exercise habits: no structured exercise -Educated on Cholesterol goals;  Importance of limiting foods high in cholesterol; Exercise goal of 150 minutes per week; -Counseled on diet and exercise extensively Recommended to continue current medication  Pre-diabetes (A1c goal <6.5%) -Controlled -Current medications: No medications -Medications previously tried: none  -Current home glucose readings fasting glucose: does not need to check post prandial glucose: does not need to check -Denies  hypoglycemic/hyperglycemic symptoms -Current meal patterns:  breakfast: did not discuss  lunch: did not discuss  dinner: did not discuss  snacks: did not discuss  drinks: did not discuss  -Current exercise: no regular exercise -Educated on A1c and blood sugar goals; Exercise goal of 150 minutes per week; -Counseled to check feet daily and get yearly eye exams -Counseled on diet and exercise extensively Patient has continued to make dietary changes and congratulated her on A1c improvement  Atrial Fibrillation (Goal: prevent stroke and major bleeding) -Controlled -CHADSVASC: 7 -Current treatment: Rhythm control: Amiodarone 200 mg 1 tablet daily - Appropriate, Effective, Safe, Accessible Anticoagulation: Eliquis 2.5 mg 1 tablet twice daily - Appropriate, Effective, Safe, Accessible -Medications previously tried: n/a -Home BP and HR readings: 76   -Counseled on increased risk of stroke due to Afib and benefits of anticoagulation for stroke prevention; importance of adherence to anticoagulant exactly as prescribed; avoidance of NSAIDs due to increased bleeding risk with anticoagulants; -Recommended to continue current medication Counseled on goal heart rate of < 110 for Afib.  Osteoporosis (Goal prevent fractures) -Uncontrolled -Patient reports she has had a DEXA scan in the past but was unable to find record. Patient reports a fracture history. -Last DEXA Scan: n/a   T-Score femoral neck: n/a  T-Score total hip: n/a  T-Score lumbar spine: n/a  T-Score forearm radius: n/a  10-year probability of major osteoporotic fracture: n/a  10-year probability of hip fracture: n/a -Patient is not a candidate for pharmacologic treatment -Current treatment  No medications  -Medications previously tried: Fosamax (completed 5 years of therapy)  -Recommend [id number] units of vitamin D daily. Counseled on oral bisphosphonate administration: take in the morning, 30 minutes prior to food with 6-8 oz  of water. Do not lie down for at least 30 minutes after taking. Recommend weight-bearing and muscle strengthening exercises for building and maintaining bone density. -Recommended to continue current medication Recommended repeat vitamin D level and consider supplementation based on level.  Hypothyroidism (Goal: TSH 0.35-4.5) -Controlled -Current treatment  Levothyroxine 25 mcg 1 tablet daily - Appropriate, Effective, Safe, Accessible -Medications previously tried: none  -Recommended to continue current medication  Pulmonary hypertension (Goal: minimize swelling) -Not ideally controlled -Current treatment  Furosemide 40 mg 1 tablet daily - Appropriate, Effective, Safe, Accessible -Medications previously tried: none  -Recommended to continue current medication Recommended weighing daily to note any differences in weight.  Health Maintenance -Vaccine gaps: shingrix, tetanus -Current therapy:  Tylenol 650 mg used as needed -Educated on Cost vs benefit of each product must be carefully weighed by individual consumer -Patient is satisfied with current therapy and denies issues -Recommended to continue current medication  Patient Goals/Self-Care Activities Patient will:  - take medications as prescribed check blood pressure weekly, document, and provide at future appointments target a minimum of 150 minutes of moderate intensity exercise weekly  Follow Up Plan: The patient has been provided with contact information for the care management team and has been advised to call with any health related questions or concerns.        Medication Assistance: None required.  Patient affirms current coverage meets needs.  Compliance/Adherence/Medication fill history: Care Gaps: Shingrix, tetanus, COVID booster BP- 122/62 (10/15/21)  Star-Rating Drugs: Atorvastatin 20 mg - [facility]16/[facility]   Patient's preferred pharmacy is:  [id number] [place] [address]: [phone]: [address]Phone: [phone]: [phone]  Uses pill box? No - husband sets out medicines for her and keeps track Pt endorses 100% compliance  We discussed: Current pharmacy is preferred with insurance plan and patient is satisfied with pharmacy services Patient decided to: Continue current medication management strategy  Care Plan and Follow Up Patient Decision:  Patient agrees to Care Plan and Follow-up.  Plan: The patient has been provided with contact information for the care management team and has been advised to call with any health related questions or concerns.   Jeni Salles, PharmD, [place] Pharmacist [place] [facility] [place]

## 2022-01-07 NOTE — Patient Instructions (Signed)
Hi Tammy [name],  It was great to catch up again! Think about getting that shingles shot like we talked about. It could save you from a lot of nerve pain.  Please reach out to me if you have any questions or need anything!  Best, Tammy [name]  Tammy [name], PharmD, [facility] [place]   Visit Information   Goals Addressed   None    Patient Care Plan: CCM Pharmacy Care Plan     Problem Identified: Problem: Hypertension, Hyperlipidemia, Atrial Fibrillation, Hypothyroidism, Osteoporosis, and Prediabetes and Pulmonary hypertension      Long-Range Goal: Patient-Specific Goal   Start Date: 07/10/2020  Expected End Date: 07/10/2021  Recent Progress: On track  Priority: High  Note:   Current Barriers:  Unable to independently monitor therapeutic efficacy Suboptimal therapeutic regimen for osteoporosis  Pharmacist Clinical Goal(s):  Patient will achieve adherence to monitoring guidelines and medication adherence to achieve therapeutic efficacy achieve control of blood pressure as evidenced by home BP readings adhere to plan to optimize therapeutic regimen for osteoporosis as evidenced by report of adherence to recommended medication management changes through collaboration with PharmD and provider.   Interventions: 1:1 collaboration with Tammy Peng, NP regarding development and update of comprehensive plan of care as evidenced by provider attestation and co-signature Inter-disciplinary care team collaboration (see longitudinal plan of care) Comprehensive medication review performed; medication list updated in electronic medical record  Hypertension (BP goal <140/90) -Controlled -Current treatment: Amlodipine 10 mg 1 tablet daily - Appropriate, Effective, Safe, Accessible -Medications previously tried: diltiazem (unknown), HCTZ  -Current home readings: 125/94 (doctor's office), 127/55, 120/52, 128/60 -Current dietary habits: patient  has been looking at package labels for sodium and is trying to stick with the 1 L recommendation of fluid each day -Current exercise habits: not active -Denies hypotensive/hypertensive symptoms -Educated on Importance of home blood pressure monitoring; Proper BP monitoring technique; Symptoms of hypotension and importance of maintaining adequate hydration; -Counseled to monitor BP at home weekly, document, and provide log at future appointments -Counseled on diet and exercise extensively Recommended to continue current medication  Hyperlipidemia: (LDL goal < 70) -Controlled -Current treatment: Atorvastatin 20 mg 1 tablet daily - Appropriate, Effective, Safe, Accessible -Medications previously tried: simvastatin (unknown)  -Current dietary patterns: did not discuss -Current exercise habits: no structured exercise -Educated on Cholesterol goals;  Importance of limiting foods high in cholesterol; Exercise goal of 150 minutes per week; -Counseled on diet and exercise extensively Recommended to continue current medication  Pre-diabetes (A1c goal <6.5%) -Controlled -Current medications: No medications -Medications previously tried: none  -Current home glucose readings fasting glucose: does not need to check post prandial glucose: does not need to check -Denies hypoglycemic/hyperglycemic symptoms -Current meal patterns:  breakfast: did not discuss  lunch: did not discuss   dinner: did not discuss  snacks: did not discuss  drinks: did not discuss  -Current exercise: no regular exercise -Educated on A1c and blood sugar goals; Exercise goal of 150 minutes per week; -Counseled to check feet daily and get yearly eye exams -Counseled on diet and exercise extensively Patient has continued to make dietary changes and congratulated her on A1c improvement  Atrial Fibrillation (Goal: prevent stroke and major bleeding) -Controlled -CHADSVASC: 7 -Current treatment: Rhythm control:  Amiodarone 200 mg 1 tablet daily - Appropriate, Effective, Safe, Accessible Anticoagulation: Eliquis 2.5 mg 1 tablet twice daily - Appropriate, Effective, Safe, Accessible -Medications previously tried: n/a -Home BP and HR readings: 76   -Counseled on increased risk  of stroke due to Afib and benefits of anticoagulation for stroke prevention; importance of adherence to anticoagulant exactly as prescribed; avoidance of NSAIDs due to increased bleeding risk with anticoagulants; -Recommended to continue current medication Counseled on goal heart rate of < 110 for Afib.  Osteoporosis (Goal prevent fractures) -Uncontrolled -Patient reports she has had a DEXA scan in the past but was unable to find record. Patient reports a fracture history. -Last DEXA Scan: n/a   T-Score femoral neck: n/a  T-Score total hip: n/a  T-Score lumbar spine: n/a  T-Score forearm radius: n/a  10-year probability of major osteoporotic fracture: n/a  10-year probability of hip fracture: n/a -Patient is not a candidate for pharmacologic treatment -Current treatment  No medications  -Medications previously tried: Fosamax (completed 5 years of therapy)  -Recommend [id number] units of vitamin D daily. Counseled on oral bisphosphonate administration: take in the morning, 30 minutes prior to food with 6-8 oz of water. Do not lie down for at least 30 minutes after taking. Recommend weight-bearing and muscle strengthening exercises for building and maintaining bone density. -Recommended to continue current medication Recommended repeat vitamin D level and consider supplementation based on level.  Hypothyroidism (Goal: TSH 0.35-4.5) -Controlled -Current treatment  Levothyroxine 25 mcg 1 tablet daily - Appropriate, Effective, Safe, Accessible -Medications previously tried: none  -Recommended to continue current medication  Pulmonary hypertension (Goal: minimize swelling) -Not ideally controlled -Current treatment   Furosemide 40 mg 1 tablet daily - Appropriate, Effective, Safe, Accessible -Medications previously tried: none  -Recommended to continue current medication Recommended weighing daily to note any differences in weight.  Health Maintenance -Vaccine gaps: shingrix, tetanus -Current therapy:  Tylenol 650 mg used as needed -Educated on Cost vs benefit of each product must be carefully weighed by individual consumer -Patient is satisfied with current therapy and denies issues -Recommended to continue current medication  Patient Goals/Self-Care Activities Patient will:  - take medications as prescribed check blood pressure weekly, document, and provide at future appointments target a minimum of 150 minutes of moderate intensity exercise weekly  Follow Up Plan: The patient has been provided with contact information for the care management team and has been advised to call with any health related questions or concerns.         Patient verbalizes understanding of instructions and care plan provided today and agrees to view in [place]. Active MyChart status and patient understanding of how to access instructions and care plan via MyChart confirmed with patient.    The patient has been provided with contact information for the care management team and has been advised to call with any health related questions or concerns.   Tammy [name], [facility]

## 2022-01-25 DIAGNOSIS — I10 Essential (primary) hypertension: Secondary | ICD-10-CM

## 2022-01-25 DIAGNOSIS — E785 Hyperlipidemia, unspecified: Secondary | ICD-10-CM

## 2022-01-29 DIAGNOSIS — M47816 Spondylosis without myelopathy or radiculopathy, lumbar region: Secondary | ICD-10-CM | POA: Diagnosis not present

## 2022-02-12 DIAGNOSIS — M47816 Spondylosis without myelopathy or radiculopathy, lumbar region: Secondary | ICD-10-CM | POA: Diagnosis not present

## 2022-02-25 ENCOUNTER — Ambulatory Visit: Payer: Medicare HMO | Attending: Cardiology | Admitting: Cardiology

## 2022-02-25 ENCOUNTER — Encounter: Payer: Self-pay | Admitting: Cardiology

## 2022-02-25 VITALS — BP 126/58 | HR 61 | Ht 67.0 in | Wt 167.0 lb

## 2022-02-25 DIAGNOSIS — Q211 Atrial septal defect, unspecified: Secondary | ICD-10-CM

## 2022-02-25 DIAGNOSIS — I10 Essential (primary) hypertension: Secondary | ICD-10-CM

## 2022-02-25 DIAGNOSIS — I079 Rheumatic tricuspid valve disease, unspecified: Secondary | ICD-10-CM | POA: Diagnosis not present

## 2022-02-25 DIAGNOSIS — I480 Paroxysmal atrial fibrillation: Secondary | ICD-10-CM

## 2022-02-25 NOTE — Progress Notes (Signed)
HPI: FU syncope, ASD and atrial fibrillation. Patient with history of syncope felt secondary to autonomic dysfunction. Previously loop monitor considered; Dr Lovena Le felt it would be indicated if recurrent syncope. H/O pulmonary embolus; seen by pulmonary and chronic anticoagulation felt indicated. Holter monitor November 2018 showed atrial fibrillation with PVCs or aberrantly conducted beats rate controlled. Patient previously placed on amiodarone and underwent successful cardioversion to sinus rhythm February 2019. Echocardiogram August 2020 showed normal LV function, severe RV dysfunction, severe right ventricular enlargement, moderate left atrial enlargement, severe right atrial enlargement, severe tricuspid regurgitation, mild aortic insufficiency. Follow-up study September 2020 showed moderate to large secundum atrial septal defect with bidirectional shunting. ABIs May 2021 normal on the right and severe on the left. Peripheral vascular disease followed by Dr. Scot Dock. Echocardiogram repeated December 2021 and showed normal LV function, moderate left ventricular hypertrophy, grade 2 diastolic dysfunction, severe pulmonary hypertension, moderate right atrial enlargement, mild mitral regurgitation, moderate to severe tricuspid regurgitation, mild aortic insufficiency.  Since last seen, she does have dyspnea on exertion.  No orthopnea, PND, chest pain or syncope.  She has chronic mild pedal edema which is unchanged.  Current Outpatient Medications  Medication Sig Dispense Refill   acetaminophen (TYLENOL) 650 MG CR tablet Take 1,300 mg by mouth every 8 (eight) hours as needed for pain.     amiodarone (PACERONE) 200 MG tablet TAKE ONE TABLET TWICE DAILY FOR ONE WEEK THEN DECREASE TO 1 TABLET DAILY 90 tablet 3   amLODipine (NORVASC) 10 MG tablet TAKE 1 TABLET BY MOUTH EVERY DAY 90 tablet 3   apixaban (ELIQUIS) 2.5 MG TABS tablet TAKE 1 TABLET BY MOUTH TWICE A DAY 60 tablet 5   atorvastatin (LIPITOR)  20 MG tablet TAKE 1 TABLET BY MOUTH DAILY AT 6 PM. 90 tablet 3   furosemide (LASIX) 40 MG tablet TAKE 1 TABLET BY MOUTH EVERY DAY 90 tablet 1   levothyroxine (SYNTHROID) 25 MCG tablet TAKE 1 TABLET BY MOUTH EVERY DAY 90 tablet 3   No current facility-administered medications for this visit.     Past Medical History:  Diagnosis Date   Arthritis    BREAST BIOPSY, HX OF 06/10/2006   Breast cyst    Dysrhythmia    HIP PAIN, LEFT 01/31/2007   HYPERLIPIDEMIA 06/10/2006   HYPERTENSION 06/10/2006   Impaired glucose tolerance    Lower extremity edema    NEOPLASMS UNSPEC NATURE BONE SOFT TISSUE&SKIN 05/31/2008   OSTEOPOROSIS 06/10/2006   Pulmonary embolus ([place])     Past Surgical History:  Procedure Laterality Date   ABDOMINAL HYSTERECTOMY     APPENDECTOMY     BREAST SURGERY     bx x2   CARDIOVERSION N/A 03/11/2017   Procedure: CARDIOVERSION;  Surgeon: Sanda Klein, MD;  Location: [place];  Service: Cardiovascular;  Laterality: N/A;   CHOLECYSTECTOMY  1980   TRANSFORAMINAL LUMBAR INTERBODY FUSION (TLIF) WITH PEDICLE SCREW FIXATION 1 LEVEL Left 10/27/2018   Procedure: Left Lumbar five-sacral one Transforaminal lumbar interbody fusion;  Surgeon: Erline Levine, MD;  Location: [place];  Service: Neurosurgery;  Laterality: Left;    Social History   Socioeconomic History   Marital status: Married    Spouse name: Not on file   Number of children: 4   Years of education: Not on file   Highest education level: Not on file  Occupational History   Not on file  Tobacco Use   Smoking status: Former    Types: Cigarettes   Smokeless tobacco: Former  Types: Snuff    Quit date: 08/10/2021  Vaping Use   Vaping Use: Never used  Substance and Sexual Activity   Alcohol use: No    Alcohol/week: 0.0 standard drinks of alcohol   Drug use: No   Sexual activity: Not Currently  Other Topics Concern   Not on file  Social History Narrative   She is retired    Worked in [employer] for 20 years     Married for 81 years    4 children - live locally.        Social Determinants of Health   Financial Resource Strain: Low Risk  (01/07/2022)   Overall Financial Resource Strain (CARDIA)    Difficulty of Paying Living Expenses: Not very hard  Food Insecurity: No Food Insecurity (10/15/2021)   Hunger Vital Sign    Worried About Running Out of Food in the Last Year: Never true    Ran Out of Food in the Last Year: Never true  Transportation Needs: No Transportation Needs (10/15/2021)   PRAPARE - Hydrologist (Medical): No    Lack of Transportation (Non-Medical): No  Physical Activity: Inactive (10/15/2021)   Exercise Vital Sign    Days of Exercise per Week: 0 days    Minutes of Exercise per Session: 0 min  Stress: No Stress Concern Present (10/15/2021)   [place]    Feeling of Stress : Not at all  Social Connections: [place] (10/15/2021)   Social Connection and Isolation Panel [NHANES]    Frequency of Communication with Friends and Family: More than three times a week    Frequency of Social Gatherings with Friends and Family: More than three times a week    Attends Religious Services: More than 4 times per year    Active Member of [employer] or Organizations: Yes    Attends Music therapist: More than 4 times per year    Marital Status: Married  Human resources officer Violence: Not At Risk (10/15/2021)   Humiliation, Afraid, Rape, and Kick questionnaire    Fear of Current or Ex-Partner: No    Emotionally Abused: No    Physically Abused: No    Sexually Abused: No    Family History  Problem Relation Age of Onset   Heart disease Mother        Pacemaker   Heart disease Father    Asthma Father    Diabetes Father    Cancer Maternal Aunt        breast   Heart disease Sister    Hyperlipidemia Sister    Hypertension Sister    Lung cancer Sister     ROS: no fevers or  chills, productive cough, hemoptysis, dysphasia, odynophagia, melena, hematochezia, dysuria, hematuria, rash, seizure activity, orthopnea, PND, pedal edema, claudication. Remaining systems are negative.  Physical Exam: Well-developed well-nourished in no acute distress.  Skin is warm and dry.  HEENT is normal.  Neck is supple.  Chest is clear to auscultation with normal expansion.  Cardiovascular exam is regular rate and rhythm.  Abdominal exam nontender or distended. No masses palpated. Extremities show trace edema. neuro grossly intact  ECG-normal sinus rhythm at a rate of 61, first-degree AV block, right bundle branch block.  Personally reviewed  A/P  1 chronic right-sided congestive heart failure-volume status appears to be reasonable today.  Will continue diuretics.  Check potassium and renal function.  2 paroxysmal atrial fibrillation-patient is in sinus  rhythm.  Continue amiodarone.  Check TSH, liver functions and chest x-ray.  Continue apixaban.  Check hemoglobin and renal function.  3 atrial septal defect-given patient's age and overall medical condition we have treated conservatively and patient agrees with this.  She wants no procedures.  Will repeat echocardiogram.  4 hypertension-blood pressure controlled.  Continue present medications.  5 hyperlipidemia-continue statin.  6 tricuspid regurgitation-as outlined above plan conservative measures given age and comorbidities.  7 peripheral vascular disease-continue medical therapy.  8 chronic stage III kidney disease-recheck potassium and renal function.  Kirk Ruths, MD

## 2022-02-25 NOTE — Patient Instructions (Signed)
  Testing/Procedures:  Your physician has requested that you have an echocardiogram. Echocardiography is a painless test that uses sound waves to create images of your heart. It provides your doctor with information about the size and shape of your heart and how well your heart's chambers and valves are working. This procedure takes approximately one hour. There are no restrictions for this procedure. Please do NOT wear cologne, perfume, aftershave, or lotions (deodorant is allowed). Please arrive 15 minutes prior to your appointment time. [place]  A chest x-ray takes a picture of the organs and structures inside the chest, including the heart, lungs, and blood vessels. This test can show several things, including, whether the heart is enlarges; whether fluid is building up in the lungs; and whether pacemaker / defibrillator leads are still in place. Jack: At [facility], you and your health needs are our priority.  As part of our continuing mission to provide you with exceptional heart care, we have created designated Provider Care Teams.  These Care Teams include your primary Cardiologist (physician) and Advanced Practice Providers (APPs -  Physician Assistants and Nurse Practitioners) who all work together to provide you with the care you need, when you need it.  We recommend signing up for the patient portal called "MyChart".  Sign up information is provided on this After Visit Summary.  MyChart is used to connect with patients for Virtual Visits (Telemedicine).  Patients are able to view lab/test results, encounter notes, upcoming appointments, etc.  Non-urgent messages can be sent to your provider as well.   To learn more about what you can do with MyChart, go to [email or web address].    Your next appointment:   6 month(s)  Provider:   Kirk Ruths, MD

## 2022-02-26 ENCOUNTER — Ambulatory Visit
Admission: RE | Admit: 2022-02-26 | Discharge: 2022-02-26 | Disposition: A | Discharge status: Home / Self Care | Payer: Medicare HMO | Source: Ambulatory Visit | Attending: Cardiology | Admitting: Cardiology

## 2022-02-26 DIAGNOSIS — Q211 Atrial septal defect, unspecified: Secondary | ICD-10-CM

## 2022-02-26 DIAGNOSIS — I079 Rheumatic tricuspid valve disease, unspecified: Secondary | ICD-10-CM

## 2022-02-26 DIAGNOSIS — I10 Essential (primary) hypertension: Secondary | ICD-10-CM

## 2022-02-26 DIAGNOSIS — R918 Other nonspecific abnormal finding of lung field: Secondary | ICD-10-CM | POA: Diagnosis not present

## 2022-02-26 DIAGNOSIS — I480 Paroxysmal atrial fibrillation: Secondary | ICD-10-CM

## 2022-02-26 LAB — LIPID PANEL
Chol/HDL Ratio: 2.1 ratio (ref 0.0–4.4)
Cholesterol, Total: 150 mg/dL (ref 100–199)
HDL: 71 mg/dL (ref >39)
LDL Chol Calc (NIH): 60 mg/dL (ref 0–99)
Triglycerides: 106 mg/dL (ref 0–149)
VLDL Cholesterol Cal: 19 mg/dL (ref 5–40)

## 2022-02-26 LAB — CBC
Hematocrit: 40 % (ref 34.0–46.6)
Hemoglobin: 13.4 g/dL (ref 11.1–15.9)
MCH: 30.8 pg (ref 26.6–33.0)
MCHC: 33.5 g/dL (ref 31.5–35.7)
MCV: 92 fL (ref 79–97)
Platelets: 310 10*3/uL (ref 150–450)
RBC: 4.35 x10E6/uL (ref 3.77–5.28)
RDW: 13.5 % (ref 11.7–15.4)
WBC: 7.7 10*3/uL (ref 3.4–10.8)

## 2022-02-26 LAB — COMPREHENSIVE METABOLIC PANEL
ALT: 15 IU/L (ref 0–32)
AST: 23 IU/L (ref 0–40)
Albumin/Globulin Ratio: 1.4 (ref 1.2–2.2)
Albumin: 4.1 g/dL (ref 3.7–4.7)
Alkaline Phosphatase: 88 IU/L (ref 44–121)
BUN/Creatinine Ratio: 21 (ref 12–28)
BUN: 36 mg/dL — ABNORMAL HIGH (ref 8–27)
Bilirubin Total: 0.7 mg/dL (ref 0.0–1.2)
CO2: 19 mmol/L — ABNORMAL LOW (ref 20–29)
Calcium: 9.6 mg/dL (ref 8.7–10.3)
Chloride: 108 mmol/L — ABNORMAL HIGH (ref 96–106)
Creatinine, Ser: 1.73 mg/dL — ABNORMAL HIGH (ref 0.57–1.00)
Globulin, Total: 2.9 g/dL (ref 1.5–4.5)
Glucose: 128 mg/dL — ABNORMAL HIGH (ref 70–99)
Potassium: 5 mmol/L (ref 3.5–5.2)
Sodium: 144 mmol/L (ref 134–144)
Total Protein: 7 g/dL (ref 6.0–8.5)
eGFR: 28 mL/min/{1.73_m2} — ABNORMAL LOW (ref >59)

## 2022-02-26 LAB — TSH: TSH: 4.76 u[IU]/mL — ABNORMAL HIGH (ref 0.450–4.500)

## 2022-03-02 NOTE — Addendum Note (Signed)
Addended by: Cristopher Estimable on: 03/02/2022 09:53 AM   Modules accepted: Orders

## 2022-03-23 ENCOUNTER — Ambulatory Visit (HOSPITAL_COMMUNITY): Payer: Medicare HMO | Attending: Cardiology

## 2022-03-23 DIAGNOSIS — I079 Rheumatic tricuspid valve disease, unspecified: Secondary | ICD-10-CM | POA: Diagnosis not present

## 2022-03-23 DIAGNOSIS — I10 Essential (primary) hypertension: Secondary | ICD-10-CM | POA: Diagnosis not present

## 2022-03-23 DIAGNOSIS — I480 Paroxysmal atrial fibrillation: Secondary | ICD-10-CM | POA: Diagnosis not present

## 2022-03-23 DIAGNOSIS — Q211 Atrial septal defect, unspecified: Secondary | ICD-10-CM | POA: Insufficient documentation

## 2022-03-23 LAB — ECHOCARDIOGRAM COMPLETE
Area-P 1/2: 2.65 cm2
S' Lateral: 2.5 cm

## 2022-04-07 ENCOUNTER — Ambulatory Visit: Payer: Medicare HMO | Admitting: Cardiology

## 2022-04-15 DIAGNOSIS — M47816 Spondylosis without myelopathy or radiculopathy, lumbar region: Secondary | ICD-10-CM | POA: Diagnosis not present

## 2022-04-16 ENCOUNTER — Other Ambulatory Visit: Payer: Self-pay | Admitting: Cardiology

## 2022-04-16 NOTE — Telephone Encounter (Signed)
Pt last saw Dr Stanford Breed 02/25/22, last labs 02/25/22 Creat 1.73, age 89, weight 75.8kg, based on specified criteria pt is on appropriate dosage of Eliquis 2.5mg  BID for afib.  Will refill rx.

## 2022-07-01 ENCOUNTER — Other Ambulatory Visit: Payer: Self-pay | Admitting: Cardiology

## 2022-07-01 DIAGNOSIS — N289 Disorder of kidney and ureter, unspecified: Secondary | ICD-10-CM

## 2022-07-09 ENCOUNTER — Other Ambulatory Visit: Payer: Self-pay | Admitting: Adult Health

## 2022-07-09 DIAGNOSIS — E785 Hyperlipidemia, unspecified: Secondary | ICD-10-CM

## 2022-07-09 NOTE — Telephone Encounter (Signed)
Patient need to schedule an ov for more refills. 

## 2022-07-19 ENCOUNTER — Other Ambulatory Visit: Payer: Self-pay | Admitting: Adult Health

## 2022-07-19 ENCOUNTER — Other Ambulatory Visit: Payer: Self-pay | Admitting: Cardiology

## 2022-07-19 DIAGNOSIS — E039 Hypothyroidism, unspecified: Secondary | ICD-10-CM

## 2022-07-21 NOTE — Telephone Encounter (Signed)
Patient need to schedule an CPE for more refills. 

## 2022-07-26 ENCOUNTER — Other Ambulatory Visit: Payer: Self-pay | Admitting: Adult Health

## 2022-07-26 DIAGNOSIS — E785 Hyperlipidemia, unspecified: Secondary | ICD-10-CM

## 2022-07-27 NOTE — Telephone Encounter (Signed)
Pt requesting refills of  atorvastatin (LIPITOR) 20 MG tablet  levothyroxine (SYNTHROID) 25 MCG tablet     [id number] [place], [place] - [address] Phone: [phone]  Fax: [phone]

## 2022-07-28 ENCOUNTER — Other Ambulatory Visit: Payer: Self-pay | Admitting: Adult Health

## 2022-07-28 DIAGNOSIS — E039 Hypothyroidism, unspecified: Secondary | ICD-10-CM

## 2022-07-28 NOTE — Telephone Encounter (Signed)
Pt checking on progress of these refills

## 2022-07-29 ENCOUNTER — Telehealth: Payer: Self-pay | Admitting: Adult Health

## 2022-07-29 NOTE — Telephone Encounter (Signed)
Prescription Request  07/29/2022  LOV: Visit date not found. Pt has appt on 08-11-2022  What is the name of the medication or equipment? amLODipine (NORVASC) 10 MG tablet , atorvastatin (LIPITOR) 20 MG tablet , levothyroxine (SYNTHROID) 25 MCG tablet  Have you contacted your pharmacy to request a refill? Yes   Which pharmacy would you like this sent to?  [facility] [phone] [place], [place] - [address] [place] [address] Phone: [phone] Fax: [phone]     Patient notified that their request is being sent to the clinical staff for review and that they should receive a response within 2 business days.   Please advise at Mobile [phone] (mobile)

## 2022-07-29 NOTE — Telephone Encounter (Signed)
Patient need to schedule a CPE for more refills. 

## 2022-07-31 NOTE — Telephone Encounter (Signed)
Pt has not been seen in 2 years. PT is coming in soon. Will call pt.

## 2022-07-31 NOTE — Telephone Encounter (Signed)
Left message to return phone call.

## 2022-07-31 NOTE — Telephone Encounter (Signed)
If pt returns call please schedule for physical.

## 2022-08-10 NOTE — Progress Notes (Signed)
HPI: FU syncope, ASD and atrial fibrillation. Patient with history of syncope felt secondary to autonomic dysfunction. Previously loop monitor considered; Dr Ladona Ridgel felt it would be indicated if recurrent syncope. H/O pulmonary embolus; seen by pulmonary and chronic anticoagulation felt indicated. Holter monitor November 2018 showed atrial fibrillation with PVCs or aberrantly conducted beats rate controlled. Patient previously placed on amiodarone and underwent successful cardioversion to sinus rhythm February 2019. Echocardiogram August 2020 showed normal LV function, severe RV dysfunction, severe right ventricular enlargement, moderate left atrial enlargement, severe right atrial enlargement, severe tricuspid regurgitation, mild aortic insufficiency. Follow-up study September 2020 showed moderate to large secundum atrial septal defect with bidirectional shunting. ABIs May 2021 normal on the right and severe on the left. Peripheral vascular disease followed by Dr. Edilia Bo. Echocardiogram repeated February 2024 and showed ejection fraction 60 to 65%, grade 1 diastolic dysfunction, D-shaped septum, mild right ventricular enlargement, moderate pulmonary hypertension, moderate biatrial enlargement, atrial septal defect, moderate to severe tricuspid regurgitation.  Since last seen, she has dyspnea with moderate activities but not routine activities in the house.  No orthopnea or PND.  Chronic mild pedal edema.  She denies chest pain or syncope.  No bleeding.  Current Outpatient Medications  Medication Sig Dispense Refill   acetaminophen (TYLENOL) 650 MG CR tablet Take 1,300 mg by mouth every 8 (eight) hours as needed for pain.     amiodarone (PACERONE) 200 MG tablet TAKE ONE TABLET TWICE DAILY FOR ONE WEEK THEN DECREASE TO 1 TABLET DAILY 90 tablet 3   amLODipine (NORVASC) 10 MG tablet Take 1 tablet (10 mg total) by mouth daily. 90 tablet 3   apixaban (ELIQUIS) 2.5 MG TABS tablet TAKE 1 TABLET BY MOUTH  TWICE A DAY 60 tablet 6   atorvastatin (LIPITOR) 20 MG tablet Take 1 tablet (20 mg total) by mouth daily. 90 tablet 3   furosemide (LASIX) 40 MG tablet TAKE 1 TABLET BY MOUTH EVERY DAY 90 tablet 1   levothyroxine (SYNTHROID) 25 MCG tablet Take 1 tablet (25 mcg total) by mouth daily. 90 tablet 3   No current facility-administered medications for this visit.     Past Medical History:  Diagnosis Date   Arthritis    BREAST BIOPSY, HX OF 06/10/2006   Breast cyst    Dysrhythmia    HIP PAIN, LEFT 01/31/2007   HYPERLIPIDEMIA 06/10/2006   HYPERTENSION 06/10/2006   Impaired glucose tolerance    Lower extremity edema    NEOPLASMS UNSPEC NATURE BONE SOFT TISSUE&SKIN 05/31/2008   OSTEOPOROSIS 06/10/2006   Pulmonary embolus (HCC)     Past Surgical History:  Procedure Laterality Date   ABDOMINAL HYSTERECTOMY     APPENDECTOMY     BREAST SURGERY     bx x2   CARDIOVERSION N/A 03/11/2017   Procedure: CARDIOVERSION;  Surgeon: Thurmon Fair, MD;  Location: [facility];  Service: Cardiovascular;  Laterality: N/A;   CHOLECYSTECTOMY  1980   TRANSFORAMINAL LUMBAR INTERBODY FUSION (TLIF) WITH PEDICLE SCREW FIXATION 1 LEVEL Left 10/27/2018   Procedure: Left Lumbar five-sacral one Transforaminal lumbar interbody fusion;  Surgeon: Maeola Harman, MD;  Location: [facility] OR;  Service: Neurosurgery;  Laterality: Left;    Social History   Socioeconomic History   Marital status: Married    Spouse name: Not on file   Number of children: 4   Years of education: Not on file   Highest education level: 10th grade  Occupational History   Not on file  Tobacco Use   Smoking  status: Former    Types: Cigarettes   Smokeless tobacco: Former    Types: Snuff    Quit date: 08/10/2021  Vaping Use   Vaping status: Never Used  Substance and Sexual Activity   Alcohol use: No    Alcohol/week: 0.0 standard drinks of alcohol   Drug use: No   Sexual activity: Not Currently  Other Topics Concern   Not on file  Social History  Narrative   She is retired    Worked in [employer] for 20 years    Married for 43 years    4 children - live locally.        Social Determinants of Health   Financial Resource Strain: Low Risk  (08/10/2022)   Overall Financial Resource Strain (CARDIA)    Difficulty of Paying Living Expenses: Not hard at all  Food Insecurity: No Food Insecurity (08/10/2022)   Hunger Vital Sign    Worried About Running Out of Food in the Last Year: Never true    Ran Out of Food in the Last Year: Never true  Transportation Needs: No Transportation Needs (08/10/2022)   PRAPARE - Administrator, Civil Service (Medical): No    Lack of Transportation (Non-Medical): No  Physical Activity: Inactive (08/10/2022)   Exercise Vital Sign    Days of Exercise per Week: 0 days    Minutes of Exercise per Session: 0 min  Stress: No Stress Concern Present (08/10/2022)   [employer] of Occupational Health - Occupational Stress Questionnaire    Feeling of Stress : Only a little  Social Connections: Socially Integrated (08/10/2022)   Social Connection and Isolation Panel [NHANES]    Frequency of Communication with Friends and Family: More than three times a week    Frequency of Social Gatherings with Friends and Family: Once a week    Attends Religious Services: More than 4 times per year    Active Member of [employer] or Organizations: No    Attends Engineer, structural: More than 4 times per year    Marital Status: Married  Catering manager Violence: Not At Risk (10/15/2021)   Humiliation, Afraid, Rape, and Kick questionnaire    Fear of Current or Ex-Partner: No    Emotionally Abused: No    Physically Abused: No    Sexually Abused: No    Family History  Problem Relation Age of Onset   Heart disease Mother        Pacemaker   Heart disease Father    Asthma Father    Diabetes Father    Cancer Maternal Aunt        breast   Heart disease Sister    Hyperlipidemia Sister    Hypertension  Sister    Lung cancer Sister     ROS: back pain but no fevers or chills, productive cough, hemoptysis, dysphasia, odynophagia, melena, hematochezia, dysuria, hematuria, rash, seizure activity, orthopnea, PND, claudication. Remaining systems are negative.  Physical Exam: Well-developed well-nourished in no acute distress.  Skin is warm and dry.  HEENT is normal.  Neck is supple.  Chest is clear to auscultation with normal expansion.  Cardiovascular exam is regular rate and rhythm.  Abdominal exam nontender or distended. No masses palpated. Extremities show trace edema. neuro grossly intact  EKG Interpretation Date/Time:  Tuesday August 18 2022 09:02:20 EDT Ventricular Rate:  60 PR Interval:  334 QRS Duration:  140 QT Interval:  498 QTC Calculation: 498 R Axis:   95  Text Interpretation:  Sinus rhythm with 1st degree A-V block Right bundle branch block Septal infarct (cited on or before 18-Aug-2022) When compared with ECG of 25-Mar-2017 14:31, Premature atrial complexes are no longer Present Questionable change in QRS axis Questionable change in initial forces of Septal leads Confirmed by Olga Millers ([id number]) on 08/18/2022 9:04:24 AM    A/P  1 paroxysmal atrial fibrillation-patient is in sinus rhythm today.  Continue amiodarone.  Continue apixaban.  2 chronic right-sided CHF-will continue diuretic at present dose.  Volume status appears to be reasonable.  3 ASD-given patient's age and comorbidities we have elected to treat conservatively and patient agrees.  4 hypertension-patient's blood pressure is elevated; however she has not had her medications this morning.  It is typically controlled.  Continue present medications and follow-up.  5 tricuspid regurgitation-we will continue with conservative management given her age and comorbidities.  6 hyperlipidemia-continue statin.  7 peripheral vascular disease-continue medical therapy.  8 chronic stage III kidney disease  Olga Millers, MD

## 2022-08-11 ENCOUNTER — Encounter: Payer: Self-pay | Admitting: Adult Health

## 2022-08-11 ENCOUNTER — Ambulatory Visit (INDEPENDENT_AMBULATORY_CARE_PROVIDER_SITE_OTHER): Payer: Medicare HMO | Admitting: Adult Health

## 2022-08-11 VITALS — BP 138/60 | HR 66 | Temp 98.1°F | Ht 67.0 in | Wt 166.0 lb

## 2022-08-11 DIAGNOSIS — I10 Essential (primary) hypertension: Secondary | ICD-10-CM | POA: Diagnosis not present

## 2022-08-11 DIAGNOSIS — I2699 Other pulmonary embolism without acute cor pulmonale: Secondary | ICD-10-CM

## 2022-08-11 DIAGNOSIS — E785 Hyperlipidemia, unspecified: Secondary | ICD-10-CM | POA: Diagnosis not present

## 2022-08-11 DIAGNOSIS — I50812 Chronic right heart failure: Secondary | ICD-10-CM

## 2022-08-11 DIAGNOSIS — E039 Hypothyroidism, unspecified: Secondary | ICD-10-CM

## 2022-08-11 DIAGNOSIS — Z0000 Encounter for general adult medical examination without abnormal findings: Secondary | ICD-10-CM | POA: Diagnosis not present

## 2022-08-11 DIAGNOSIS — I739 Peripheral vascular disease, unspecified: Secondary | ICD-10-CM | POA: Diagnosis not present

## 2022-08-11 DIAGNOSIS — I480 Paroxysmal atrial fibrillation: Secondary | ICD-10-CM

## 2022-08-11 DIAGNOSIS — E7439 Other disorders of intestinal carbohydrate absorption: Secondary | ICD-10-CM | POA: Diagnosis not present

## 2022-08-11 LAB — LIPID PANEL
Cholesterol: 177 mg/dL (ref 0–200)
HDL: 62.2 mg/dL (ref >39.00)
LDL Cholesterol: 94 mg/dL (ref 0–99)
NonHDL: 114.36
Total CHOL/HDL Ratio: 3
Triglycerides: 103 mg/dL (ref 0.0–149.0)
VLDL: 20.6 mg/dL (ref 0.0–40.0)

## 2022-08-11 LAB — COMPREHENSIVE METABOLIC PANEL
ALT: 17 U/L (ref 0–35)
AST: 22 U/L (ref 0–37)
Albumin: 4.1 g/dL (ref 3.5–5.2)
Alkaline Phosphatase: 85 U/L (ref 39–117)
BUN: 45 mg/dL — ABNORMAL HIGH (ref 6–23)
CO2: 23 mEq/L (ref 19–32)
Calcium: 9.7 mg/dL (ref 8.4–10.5)
Chloride: 107 mEq/L (ref 96–112)
Creatinine, Ser: 1.88 mg/dL — ABNORMAL HIGH (ref 0.40–1.20)
GFR: 23.5 mL/min — ABNORMAL LOW (ref >60.00)
Glucose, Bld: 115 mg/dL — ABNORMAL HIGH (ref 70–99)
Potassium: 4.4 mEq/L (ref 3.5–5.1)
Sodium: 140 mEq/L (ref 135–145)
Total Bilirubin: 0.8 mg/dL (ref 0.2–1.2)
Total Protein: 7.6 g/dL (ref 6.0–8.3)

## 2022-08-11 LAB — CBC WITH DIFFERENTIAL/PLATELET
Basophils Absolute: 0 10*3/uL (ref 0.0–0.1)
Basophils Relative: 0.3 % (ref 0.0–3.0)
Eosinophils Absolute: 0.2 10*3/uL (ref 0.0–0.7)
Eosinophils Relative: 2.1 % (ref 0.0–5.0)
HCT: 40.3 % (ref 36.0–46.0)
Hemoglobin: 13.2 g/dL (ref 12.0–15.0)
Lymphocytes Relative: 42.1 % (ref 12.0–46.0)
Lymphs Abs: 3.6 10*3/uL (ref 0.7–4.0)
MCHC: 32.7 g/dL (ref 30.0–36.0)
MCV: 94.5 fl (ref 78.0–100.0)
Monocytes Absolute: 0.8 10*3/uL (ref 0.1–1.0)
Monocytes Relative: 9.4 % (ref 3.0–12.0)
Neutro Abs: 4 10*3/uL (ref 1.4–7.7)
Neutrophils Relative %: 46.1 % (ref 43.0–77.0)
Platelets: 299 10*3/uL (ref 150.0–400.0)
RBC: 4.26 Mil/uL (ref 3.87–5.11)
RDW: 15.8 % — ABNORMAL HIGH (ref 11.5–15.5)
WBC: 8.6 10*3/uL (ref 4.0–10.5)

## 2022-08-11 LAB — HEMOGLOBIN A1C: Hgb A1c MFr Bld: 6.3 % (ref 4.6–6.5)

## 2022-08-11 MED ORDER — AMLODIPINE BESYLATE 10 MG PO TABS
10.0000 mg | ORAL_TABLET | Freq: Every day | ORAL | 3 refills | Status: DC
Start: 2022-08-11 — End: 2023-05-21

## 2022-08-11 MED ORDER — ATORVASTATIN CALCIUM 20 MG PO TABS: 20.0000 mg | ORAL_TABLET | Freq: Every day | ORAL | 3 refills | Status: DC

## 2022-08-11 MED ORDER — LEVOTHYROXINE SODIUM 25 MCG PO TABS
25.0000 ug | ORAL_TABLET | Freq: Every day | ORAL | 3 refills | Status: DC
Start: 2022-08-11 — End: 2023-05-21

## 2022-08-11 NOTE — Progress Notes (Signed)
Subjective:    Patient ID: Tammy [name], female    DOB: [birth year], 89 y.o.   MRN: [id number]  HPI Patient presents for yearly preventative medicine examination. She is a pleasant 89 year old female who  has a past medical history of Arthritis, BREAST BIOPSY, HX OF (06/10/2006), Breast cyst, Dysrhythmia, HIP PAIN, LEFT (01/31/2007), HYPERLIPIDEMIA (06/10/2006), HYPERTENSION (06/10/2006), Impaired glucose tolerance, Lower extremity edema, NEOPLASMS UNSPEC NATURE BONE SOFT TISSUE&SKIN (05/31/2008), OSTEOPOROSIS (06/10/2006), and Pulmonary embolus (HCC).  She has been out of medications that I prescribe for the last 3 weeks.   Hyperlipidemia -currently managed with Lipitor 20 mg daily.  Denies myalgia  Lab Results  Component Value Date   CHOL 150 02/25/2022   HDL 71 02/25/2022   LDLCALC 60 02/25/2022   TRIG 106 02/25/2022   CHOLHDL 2.1 02/25/2022   Hypertension -managed with Norvasc 10 mg daily and Lasix 40 mg daily.  She denies dizziness, lightheadedness, or chest pain. BP Readings from Last 3 Encounters:  08/11/22 138/60  02/25/22 (!) 126/58  10/15/21 122/62    PAF -managed by cardiology.  Currently prescribed amiodarone 200 mg BID and Eliquis 2.5 mg twice daily.  She did undergo a successful cardioversion to sinus rhythm in February 2019.  Chronic right-sided heart failure-is on Lasix 40 mg daily.  Her volume status seems to be reasonable.  PVD-followed by vascular surgery  History of Pulmonary Embolism - on Eliquis   Hypothyroidism -managed with Synthroid 25 mcg every morning.  She did have a thyroid ultrasound in October 2022 that showed a nodule in the inferior left thyroid lobe.  Her biopsy results showed scant follicular epithelium present.  CKD III - takes lasix 40 mg daily.   Glucose Intolerance - not current managed with medication  Lab Results  Component Value Date   HGBA1C 6.4 07/03/2021    All immunizations and health maintenance protocols were reviewed with the patient  and needed orders were placed.  Appropriate screening laboratory values were ordered for the patient including screening of hyperlipidemia, renal function and hepatic function. If indicated by BPH, a PSA was ordered.  Medication reconciliation,  past medical history, social history, problem list and allergies were reviewed in detail with the patient  Goals were established with regard to weight loss, exercise, and  diet in compliance with medications  Review of Systems  Constitutional: Negative.   HENT: Negative.    Eyes: Negative.   Respiratory: Negative.    Cardiovascular: Negative.   Gastrointestinal: Negative.   Endocrine: Negative.   Genitourinary: Negative.   Musculoskeletal: Negative.   Skin: Negative.   Allergic/Immunologic: Negative.   Neurological: Negative.   Hematological: Negative.   Psychiatric/Behavioral: Negative.     Past Medical History:  Diagnosis Date   Arthritis    BREAST BIOPSY, HX OF 06/10/2006   Breast cyst    Dysrhythmia    HIP PAIN, LEFT 01/31/2007   HYPERLIPIDEMIA 06/10/2006   HYPERTENSION 06/10/2006   Impaired glucose tolerance    Lower extremity edema    NEOPLASMS UNSPEC NATURE BONE SOFT TISSUE&SKIN 05/31/2008   OSTEOPOROSIS 06/10/2006   Pulmonary embolus (HCC)     Social History   Socioeconomic History   Marital status: Married    Spouse name: Not on file   Number of children: 4   Years of education: Not on file   Highest education level: 10th grade  Occupational History   Not on file  Tobacco Use   Smoking status: Former    Types: Cigarettes  Smokeless tobacco: Former    Types: Snuff    Quit date: 08/10/2021  Vaping Use   Vaping status: Never Used  Substance and Sexual Activity   Alcohol use: No    Alcohol/week: 0.0 standard drinks of alcohol   Drug use: No   Sexual activity: Not Currently  Other Topics Concern   Not on file  Social History Narrative   She is retired    Worked in [employer] for 20 years    Married for 43  years    4 children - live locally.        Social Determinants of Health   Financial Resource Strain: Low Risk  (08/10/2022)   Overall Financial Resource Strain (CARDIA)    Difficulty of Paying Living Expenses: Not hard at all  Food Insecurity: No Food Insecurity (08/10/2022)   Hunger Vital Sign    Worried About Running Out of Food in the Last Year: Never true    Ran Out of Food in the Last Year: Never true  Transportation Needs: No Transportation Needs (08/10/2022)   PRAPARE - Administrator, Civil Service (Medical): No    Lack of Transportation (Non-Medical): No  Physical Activity: Inactive (08/10/2022)   Exercise Vital Sign    Days of Exercise per Week: 0 days    Minutes of Exercise per Session: 0 min  Stress: No Stress Concern Present (08/10/2022)   [employer] of Occupational Health - Occupational Stress Questionnaire    Feeling of Stress : Only a little  Social Connections: Socially Integrated (08/10/2022)   Social Connection and Isolation Panel [NHANES]    Frequency of Communication with Friends and Family: More than three times a week    Frequency of Social Gatherings with Friends and Family: Once a week    Attends Religious Services: More than 4 times per year    Active Member of Clubs or Organizations: No    Attends Engineer, structural: More than 4 times per year    Marital Status: Married  Catering manager Violence: Not At Risk (10/15/2021)   Humiliation, Afraid, Rape, and Kick questionnaire    Fear of Current or Ex-Partner: No    Emotionally Abused: No    Physically Abused: No    Sexually Abused: No    Past Surgical History:  Procedure Laterality Date   ABDOMINAL HYSTERECTOMY     APPENDECTOMY     BREAST SURGERY     bx x2   CARDIOVERSION N/A 03/11/2017   Procedure: CARDIOVERSION;  Surgeon: Thurmon Fair, MD;  Location: [facility];  Service: Cardiovascular;  Laterality: N/A;   CHOLECYSTECTOMY  1980   TRANSFORAMINAL LUMBAR INTERBODY  FUSION (TLIF) WITH PEDICLE SCREW FIXATION 1 LEVEL Left 10/27/2018   Procedure: Left Lumbar five-sacral one Transforaminal lumbar interbody fusion;  Surgeon: Maeola Harman, MD;  Location: [facility] OR;  Service: Neurosurgery;  Laterality: Left;    Family History  Problem Relation Age of Onset   Heart disease Mother        Pacemaker   Heart disease Father    Asthma Father    Diabetes Father    Cancer Maternal Aunt        breast   Heart disease Sister    Hyperlipidemia Sister    Hypertension Sister    Lung cancer Sister     Allergies  Allergen Reactions   Tuberculin Tests Other (See Comments)    Tuberculosis shot, red spot on arm    Current Outpatient Medications on File Prior  to Visit  Medication Sig Dispense Refill   acetaminophen (TYLENOL) 650 MG CR tablet Take 1,300 mg by mouth every 8 (eight) hours as needed for pain.     amiodarone (PACERONE) 200 MG tablet TAKE ONE TABLET TWICE DAILY FOR ONE WEEK THEN DECREASE TO 1 TABLET DAILY 90 tablet 3   amLODipine (NORVASC) 10 MG tablet TAKE 1 TABLET BY MOUTH EVERY DAY 90 tablet 3   apixaban (ELIQUIS) 2.5 MG TABS tablet TAKE 1 TABLET BY MOUTH TWICE A DAY 60 tablet 6   atorvastatin (LIPITOR) 20 MG tablet TAKE 1 TABLET BY MOUTH DAILY AT 6 PM. 90 tablet 3   furosemide (LASIX) 40 MG tablet TAKE 1 TABLET BY MOUTH EVERY DAY 90 tablet 1   levothyroxine (SYNTHROID) 25 MCG tablet TAKE 1 TABLET BY MOUTH EVERY DAY 90 tablet 3   No current facility-administered medications on file prior to visit.    BP 138/60   Pulse 66   Temp 98.1 F (36.7 C) (Oral)   Ht 5\' 7"  (1.702 m)   Wt 166 lb (75.3 kg)   LMP  (LMP Unknown)   SpO2 97%   BMI 26.00 kg/m       Objective:   Physical Exam Vitals and nursing note reviewed.  Constitutional:      Appearance: Normal appearance.  Cardiovascular:     Rate and Rhythm: Normal rate and regular rhythm.     Pulses: Normal pulses.     Heart sounds: Normal heart sounds.  Pulmonary:     Effort: Pulmonary effort is  normal.     Breath sounds: Normal breath sounds.  Musculoskeletal:        General: Normal range of motion.     Right lower leg: 1+ Edema present.     Left lower leg: 1+ Edema present.  Skin:    General: Skin is warm and dry.  Neurological:     General: No focal deficit present.     Mental Status: She is alert and oriented to person, place, and time.  Psychiatric:        Mood and Affect: Mood normal.        Behavior: Behavior normal.        Thought Content: Thought content normal.        Judgment: Judgment normal.        Assessment & Plan:  1. Routine general medical examination at a health care facility Today patient counseled on age appropriate routine health concerns for screening and prevention, each reviewed and up to date or declined. Immunizations reviewed and up to date or declined. Labs ordered and reviewed. Risk factors for depression reviewed and negative. Hearing function and visual acuity are intact. ADLs screened and addressed as needed. Functional ability and level of safety reviewed and appropriate. Education, counseling and referrals performed based on assessed risks today. Patient provided with a copy of personalized plan for preventive services. - Advised shingles and tdap at pharmacy - she refused both   2. Essential hypertension - well controlled. No change in medication - CBC with Differential/Platelet; Future - Comprehensive metabolic panel; Future - TSH; Future - Hemoglobin A1c; Future - amLODipine (NORVASC) 10 MG tablet; Take 1 tablet (10 mg total) by mouth daily.  Dispense: 90 tablet; Refill: 3  3. Dyslipidemia - Continue statin  - CBC with Differential/Platelet; Future - Comprehensive metabolic panel; Future - TSH; Future - Hemoglobin A1c; Future - atorvastatin (LIPITOR) 20 MG tablet; Take 1 tablet (20 mg total) by mouth daily.  Dispense: 90 tablet; Refill: 3 - Lipid panel; Future  4. Paroxysmal atrial fibrillation [facility]) - Per cardiology. NSR today   - CBC with Differential/Platelet; Future - Comprehensive metabolic panel; Future - TSH; Future - Hemoglobin A1c; Future  5. Hypothyroidism, unspecified type - Consider dose change of synthroid  - CBC with Differential/Platelet; Future - Comprehensive metabolic panel; Future - TSH; Future - Hemoglobin A1c; Future - levothyroxine (SYNTHROID) 25 MCG tablet; Take 1 tablet (25 mcg total) by mouth daily.  Dispense: 90 tablet; Refill: 3  6. PAD (peripheral artery disease) (HCC)  - CBC with Differential/Platelet; Future - Comprehensive metabolic panel; Future - TSH; Future - Hemoglobin A1c; Future  7. Embolism, pulmonary with infarction (HCC) - Continue Eliquis 2.5 mg weekly.  - CBC with Differential/Platelet; Future - Comprehensive metabolic panel; Future - TSH; Future - Hemoglobin A1c; Future  8. Chronic right-sided congestive heart failure (HCC) -  Stable. Continue lasix. Wear compression socks - CBC with Differential/Platelet; Future - Comprehensive metabolic panel; Future - TSH; Future - Hemoglobin A1c; Future  9. Glucose intolerance - Refused medications and to stop eating sweets.  - CBC with Differential/Platelet; Future - Comprehensive metabolic panel; Future - TSH; Future - Hemoglobin A1c; Future  Shirline Frees, NP

## 2022-08-11 NOTE — Patient Instructions (Addendum)
It was great seeing you today   We will follow up with you regarding your lab work   Please let me know if you need anything   

## 2022-08-12 LAB — TSH: TSH: 3.13 u[IU]/mL (ref 0.35–5.50)

## 2022-08-18 ENCOUNTER — Encounter: Payer: Self-pay | Admitting: Cardiology

## 2022-08-18 ENCOUNTER — Ambulatory Visit: Payer: Medicare HMO | Attending: Cardiology | Admitting: Cardiology

## 2022-08-18 VITALS — BP 158/60 | HR 62 | Ht 67.0 in | Wt 167.8 lb

## 2022-08-18 DIAGNOSIS — Q211 Atrial septal defect, unspecified: Secondary | ICD-10-CM

## 2022-08-18 DIAGNOSIS — I480 Paroxysmal atrial fibrillation: Secondary | ICD-10-CM | POA: Diagnosis not present

## 2022-08-18 DIAGNOSIS — I10 Essential (primary) hypertension: Secondary | ICD-10-CM

## 2022-08-18 DIAGNOSIS — I079 Rheumatic tricuspid valve disease, unspecified: Secondary | ICD-10-CM | POA: Diagnosis not present

## 2022-08-18 MED ORDER — APIXABAN 2.5 MG PO TABS: 2.5000 mg | ORAL_TABLET | Freq: Two times a day (BID) | ORAL | Status: DC

## 2022-08-18 NOTE — Addendum Note (Signed)
Addended by: Freddi Starr on: 08/18/2022 09:23 AM   Modules accepted: Orders

## 2022-08-18 NOTE — Patient Instructions (Signed)

## 2022-09-10 ENCOUNTER — Encounter (INDEPENDENT_AMBULATORY_CARE_PROVIDER_SITE_OTHER): Payer: Self-pay

## 2022-09-25 ENCOUNTER — Other Ambulatory Visit: Payer: Self-pay | Admitting: Cardiology

## 2022-09-25 DIAGNOSIS — N289 Disorder of kidney and ureter, unspecified: Secondary | ICD-10-CM

## 2022-10-21 DIAGNOSIS — H524 Presbyopia: Secondary | ICD-10-CM | POA: Diagnosis not present

## 2022-10-21 DIAGNOSIS — Z0100 Encounter for examination of eyes and vision without abnormal findings: Secondary | ICD-10-CM | POA: Diagnosis not present

## 2022-10-21 DIAGNOSIS — D3131 Benign neoplasm of right choroid: Secondary | ICD-10-CM | POA: Diagnosis not present

## 2022-10-23 ENCOUNTER — Ambulatory Visit (INDEPENDENT_AMBULATORY_CARE_PROVIDER_SITE_OTHER): Payer: Medicare HMO

## 2022-10-23 VITALS — Ht 67.0 in | Wt 167.0 lb

## 2022-10-23 DIAGNOSIS — Z0000 Encounter for general adult medical examination without abnormal findings: Secondary | ICD-10-CM

## 2022-10-23 NOTE — Patient Instructions (Addendum)
Ms. Strnad , Thank you for taking time to come for your Medicare Wellness Visit. I appreciate your ongoing commitment to your health goals. Please review the following plan we discussed and let me know if I can assist you in the future.   Referrals/Orders/Follow-Ups/Clinician Recommendations:   This is a list of the screening recommended for you and due dates:  Health Maintenance  Topic Date Due   DTaP/Tdap/Td vaccine (2 - Td or Tdap) 06/12/2020   Flu Shot  08/27/2022   COVID-19 Vaccine (5 - 2023-24 season) 09/27/2022   Zoster (Shingles) Vaccine (1 of 2) 11/11/2022*   Medicare Annual Wellness Visit  10/23/2023   Pneumonia Vaccine  Completed   HPV Vaccine  Aged Out   Mammogram  Discontinued   DEXA scan (bone density measurement)  Discontinued  *Topic was postponed. The date shown is not the original due date.    Advanced directives: (Declined) Advance directive discussed with you today. Even though you declined this today, please call our office should you change your mind, and we can give you the proper paperwork for you to fill out.  Next Medicare Annual Wellness Visit scheduled for next year: Yes

## 2022-10-23 NOTE — Progress Notes (Signed)
Subjective:   Tammy [name] is a 89 y.o. female who presents for Medicare Annual (Subsequent) preventive examination.  Visit Complete: Virtual  I connected with  Tammy [name] on 10/23/22 by a audio enabled telemedicine application and verified that I am speaking with the correct person using two identifiers.  Patient Location: Home  Provider Location: Home Office  I discussed the limitations of evaluation and management by telemedicine. The patient expressed understanding and agreed to proceed.  Because this visit was a virtual/telehealth visit, some criteria may be missing or patient reported. Any vitals not documented were not able to be obtained and vitals that have been documented are patient reported.    Cardiac Risk Factors include: advanced age (>109men, >22 women);hypertension     Objective:    Today's Vitals   10/23/22 0921  Weight: 167 lb (75.8 kg)  Height: 5\' 7"  (1.702 m)   Body mass index is 26.16 kg/m.     10/23/2022    9:29 AM 10/15/2021    3:27 PM 10/02/2020    3:09 PM 11/22/2019    9:57 AM 10/25/2018    9:59 AM 06/07/2018   10:57 AM 06/02/2018    2:38 PM  Advanced Directives  Does Patient Have a Medical Advance Directive? No No No No No No No  Would patient like information on creating a medical advance directive? No - Patient declined No - Patient declined No - Patient declined No - Patient declined No - Patient declined No - Patient declined No - Patient declined    Current Medications (verified) Outpatient Encounter Medications as of 10/23/2022  Medication Sig   acetaminophen (TYLENOL) 650 MG CR tablet Take 1,300 mg by mouth every 8 (eight) hours as needed for pain.   amiodarone (PACERONE) 200 MG tablet TAKE ONE TABLET TWICE DAILY FOR ONE WEEK THEN DECREASE TO 1 TABLET DAILY   amLODipine (NORVASC) 10 MG tablet Take 1 tablet (10 mg total) by mouth daily.   apixaban (ELIQUIS) 2.5 MG TABS tablet Take 1 tablet (2.5 mg total) by mouth 2 (two) times daily.    atorvastatin (LIPITOR) 20 MG tablet Take 1 tablet (20 mg total) by mouth daily.   furosemide (LASIX) 40 MG tablet TAKE 1 TABLET BY MOUTH EVERY DAY   levothyroxine (SYNTHROID) 25 MCG tablet Take 1 tablet (25 mcg total) by mouth daily.   No facility-administered encounter medications on file as of 10/23/2022.    Allergies (verified) Tuberculin tests   History: Past Medical History:  Diagnosis Date   Arthritis    BREAST BIOPSY, HX OF 06/10/2006   Breast cyst    Dysrhythmia    HIP PAIN, LEFT 01/31/2007   HYPERLIPIDEMIA 06/10/2006   HYPERTENSION 06/10/2006   Impaired glucose tolerance    Lower extremity edema    NEOPLASMS UNSPEC NATURE BONE SOFT TISSUE&SKIN 05/31/2008   OSTEOPOROSIS 06/10/2006   Pulmonary embolus (HCC)    Past Surgical History:  Procedure Laterality Date   ABDOMINAL HYSTERECTOMY     APPENDECTOMY     BREAST SURGERY     bx x2   CARDIOVERSION N/A 03/11/2017   Procedure: CARDIOVERSION;  Surgeon: Thurmon Fair, MD;  Location: [facility];  Service: Cardiovascular;  Laterality: N/A;   CHOLECYSTECTOMY  1980   TRANSFORAMINAL LUMBAR INTERBODY FUSION (TLIF) WITH PEDICLE SCREW FIXATION 1 LEVEL Left 10/27/2018   Procedure: Left Lumbar five-sacral one Transforaminal lumbar interbody fusion;  Surgeon: Maeola Harman, MD;  Location: [facility];  Service: Neurosurgery;  Laterality: Left;   Family History  Problem Relation Age of Onset   Heart disease Mother        Pacemaker   Heart disease Father    Asthma Father    Diabetes Father    Cancer Maternal Aunt        breast   Heart disease Sister    Hyperlipidemia Sister    Hypertension Sister    Lung cancer Sister    Social History   Socioeconomic History   Marital status: Married    Spouse name: Not on file   Number of children: 4   Years of education: Not on file   Highest education level: 10th grade  Occupational History   Not on file  Tobacco Use   Smoking status: Former    Types: Cigarettes   Smokeless tobacco: Former     Types: Snuff    Quit date: 08/10/2021  Vaping Use   Vaping status: Never Used  Substance and Sexual Activity   Alcohol use: No    Alcohol/week: 0.0 standard drinks of alcohol   Drug use: No   Sexual activity: Not Currently  Other Topics Concern   Not on file  Social History Narrative   She is retired    Worked in [employer] for 20 years    Married for 43 years    4 children - live locally.        Social Determinants of Health   Financial Resource Strain: Low Risk  (10/23/2022)   Overall Financial Resource Strain (CARDIA)    Difficulty of Paying Living Expenses: Not hard at all  Food Insecurity: No Food Insecurity (10/23/2022)   Hunger Vital Sign    Worried About Running Out of Food in the Last Year: Never true    Ran Out of Food in the Last Year: Never true  Transportation Needs: No Transportation Needs (10/23/2022)   PRAPARE - Administrator, Civil Service (Medical): No    Lack of Transportation (Non-Medical): No  Physical Activity: Inactive (10/23/2022)   Exercise Vital Sign    Days of Exercise per Week: 0 days    Minutes of Exercise per Session: 0 min  Stress: No Stress Concern Present (10/23/2022)   [employer] of Occupational Health - Occupational Stress Questionnaire    Feeling of Stress : Not at all  Social Connections: Socially Integrated (10/23/2022)   Social Connection and Isolation Panel [NHANES]    Frequency of Communication with Friends and Family: More than three times a week    Frequency of Social Gatherings with Friends and Family: More than three times a week    Attends Religious Services: More than 4 times per year    Active Member of [employer] or Organizations: Yes    Attends Engineer, structural: More than 4 times per year    Marital Status: Married    Tobacco Counseling Counseling given: Not Answered   Clinical Intake:  Pre-visit preparation completed: Yes  Pain : No/denies pain     BMI - recorded:  26.16 Nutritional Status: BMI 25 -29 Overweight Nutritional Risks: None Diabetes: No  How often do you need to have someone help you when you read instructions, pamphlets, or other written materials from your doctor or pharmacy?: 1 - Never  Interpreter Needed?: No  Information entered by :: Theresa Mulligan LPN   Activities of Daily Living    10/23/2022    9:29 AM  In your present state of health, do you have any difficulty performing the following activities:  Hearing? 1  Comment Wears hearing aids  Vision? 0  Difficulty concentrating or making decisions? 0  Walking or climbing stairs? 0  Dressing or bathing? 0  Doing errands, shopping? 0  Preparing Food and eating ? N  Using the Toilet? N  In the past six months, have you accidently leaked urine? N  Do you have problems with loss of bowel control? N  Managing your Medications? N  Managing your Finances? N  Housekeeping or managing your Housekeeping? N    Patient Care Team: Shirline Frees, NP as PCP - General (Family Medicine) Jens Som Madolyn Frieze, MD as PCP - Cardiology (Cardiology) Verner Chol, [facility] (Inactive) as Pharmacist (Pharmacist)  Indicate any recent Medical Services you may have received from other than [facility] providers in the past year (date may be approximate).     Assessment:   This is a routine wellness examination for [facility].  Hearing/Vision screen Hearing Screening - Comments:: Wears hearing aids Vision Screening - Comments:: Wears rx glasses - up to date with routine eye exams with  [facility]   Goals Addressed               This Visit's Progress     Stay Active (pt-stated)         Depression Screen    10/23/2022    9:28 AM 10/15/2021    3:17 PM 07/03/2021    8:59 AM 10/02/2020    3:08 PM 11/22/2019    9:59 AM 06/29/2019    7:54 AM 03/11/2018   10:29 AM  PHQ 2/9 Scores  PHQ - 2 Score 0 0 0 0 0 0 0  PHQ- 9 Score   3  0      Fall Risk    10/23/2022    9:29 AM 08/10/2022   12:00 PM  10/15/2021    3:27 PM 07/03/2021    8:59 AM 10/02/2020    3:10 PM  Fall Risk   Falls in the past year? 0 0 0 0 1  Number falls in past yr: 0  0 0 1  Injury with Fall? 0  0 0 1  Comment     right foot  Risk for fall due to : No Fall Risks  No Fall Risks No Fall Risks Impaired vision;Impaired balance/gait;Impaired mobility  Follow up Falls prevention discussed  Falls prevention discussed Falls prevention discussed Falls prevention discussed    MEDICARE RISK AT HOME: Medicare Risk at Home Any stairs in or around the home?: No If so, are there any without handrails?: No Home free of loose throw rugs in walkways, pet beds, electrical cords, etc?: Yes Adequate lighting in your home to reduce risk of falls?: Yes Life alert?: No Use of a cane, walker or w/c?: Yes Grab bars in the bathroom?: Yes Shower chair or bench in shower?: No Elevated toilet seat or a handicapped toilet?: Yes  TIMED UP AND GO:  Was the test performed?  No    Cognitive Function:        10/23/2022    9:30 AM 10/15/2021    3:27 PM 10/02/2020    3:13 PM 11/22/2019   10:05 AM  6CIT Screen  What Year? 0 points 0 points 0 points 0 points  What month? 0 points 0 points 0 points 0 points  What time? 0 points 0 points 0 points 0 points  Count back from 20 0 points 0 points 0 points 0 points  Months in reverse 0 points 0 points  4 points 4 points  Repeat phrase 0 points 0 points 2 points 8 points  Total Score 0 points 0 points 6 points 12 points    Immunizations Immunization History  Administered Date(s) Administered   Fluad Quad(high Dose 65+) 12/06/2018   Influenza Split 12/15/2010, 12/14/2011, 10/27/2014   Influenza Whole 10/09/2009   Influenza, High Dose Seasonal PF 12/13/2012, 11/04/2014, 10/28/2017, 11/06/2019, 11/08/2020   Influenza,inj,Quad PF,6+ Mos 12/18/2013   Influenza-Unspecified 11/26/2015, 10/30/2016   PFIZER(Purple Top)SARS-COV-2 Vaccination 04/22/2019, 05/13/2019, 11/06/2019   Pfizer Covid-19  Vaccine Bivalent Booster 38yrs & up 12/11/2020   Pneumococcal Conjugate-13 05/31/2013   Pneumococcal Polysaccharide-23 06/13/2010   Tdap 06/13/2010    TDAP status: Due, Education has been provided regarding the importance of this vaccine. Advised may receive this vaccine at local pharmacy or Health Dept. Aware to provide a copy of the vaccination record if obtained from local pharmacy or Health Dept. Verbalized acceptance and understanding.  Flu Vaccine status: Due, Education has been provided regarding the importance of this vaccine. Advised may receive this vaccine at local pharmacy or Health Dept. Aware to provide a copy of the vaccination record if obtained from local pharmacy or Health Dept. Verbalized acceptance and understanding.  Pneumococcal vaccine status: Up to date  Covid-19 vaccine status: Declined, Education has been provided regarding the importance of this vaccine but patient still declined. Advised may receive this vaccine at local pharmacy or Health Dept.or vaccine clinic. Aware to provide a copy of the vaccination record if obtained from local pharmacy or Health Dept. Verbalized acceptance and understanding.    Screening Tests Health Maintenance  Topic Date Due   DTaP/Tdap/Td (2 - Td or Tdap) 06/12/2020   INFLUENZA VACCINE  08/27/2022   COVID-19 Vaccine (5 - 2023-24 season) 09/27/2022   Zoster Vaccines- Shingrix (1 of 2) 11/11/2022 (Originally [birth year]1983)   Medicare Annual Wellness (AWV)  10/23/2023   Pneumonia Vaccine 67+ Years old  Completed   HPV VACCINES  Aged Out   MAMMOGRAM  Discontinued   DEXA SCAN  Discontinued    Health Maintenance  Health Maintenance Due  Topic Date Due   DTaP/Tdap/Td (2 - Td or Tdap) 06/12/2020   INFLUENZA VACCINE  08/27/2022   COVID-19 Vaccine (5 - 2023-24 season) 09/27/2022     Additional Screening:    Vision Screening: Recommended annual ophthalmology exams for early detection of glaucoma and other disorders of the eye. Is  the patient up to date with their annual eye exam?  Yes  Who is the provider or what is the name of the office in which the patient attends annual eye exams? [facility] If pt is not established with a provider, would they like to be referred to a provider to establish care? No .   Dental Screening: Recommended annual dental exams for proper oral hygiene    Community Resource Referral / Chronic Care Management:  CRR required this visit?  No   CCM required this visit?  No     Plan:     I have personally reviewed and noted the following in the patient's chart:   Medical and social history Use of alcohol, tobacco or illicit drugs  Current medications and supplements including opioid prescriptions. Patient is not currently taking opioid prescriptions. Functional ability and status Nutritional status Physical activity Advanced directives List of other physicians Hospitalizations, surgeries, and ER visits in previous 12 months Vitals Screenings to include cognitive, depression, and falls Referrals and appointments  In addition, I have reviewed and discussed with  patient certain preventive protocols, quality metrics, and best practice recommendations. A written personalized care plan for preventive services as well as general preventive health recommendations were provided to patient.     Tillie Rung, LPN   05/04/8117   After Visit Summary: (MyChart) Due to this being a telephonic visit, the after visit summary with patients personalized plan was offered to patient via MyChart   Nurse Notes: None

## 2022-11-27 ENCOUNTER — Other Ambulatory Visit: Payer: Self-pay | Admitting: Cardiology

## 2022-11-27 MED ORDER — APIXABAN 2.5 MG PO TABS: 2.5000 mg | ORAL_TABLET | Freq: Two times a day (BID) | ORAL | 1 refills | Status: DC

## 2022-11-27 NOTE — Telephone Encounter (Signed)
Prescription refill request for Eliquis received. Indication:AFIB Last office visit:7/24 Scr:1.88  7/24 Age: 89 Weight:75.8  kg  Prescription refilled

## 2022-11-27 NOTE — Telephone Encounter (Signed)
*  STAT* If patient is at the pharmacy, call can be transferred to refill team.   1. Which medications need to be refilled? (please list name of each medication and dose if known)   apixaban (ELIQUIS) 2.5 MG TABS tablet    2. Which pharmacy/location (including street and city if local pharmacy) is medication to be sent to?[facility] [phone] [address] [place]   3. Do they need a 30 day or 90 day supply? 90 day

## 2023-01-04 ENCOUNTER — Telehealth: Payer: Self-pay | Admitting: Cardiology

## 2023-01-04 DIAGNOSIS — N289 Disorder of kidney and ureter, unspecified: Secondary | ICD-10-CM

## 2023-01-04 MED ORDER — AMIODARONE HCL 200 MG PO TABS: 200.0000 mg | ORAL_TABLET | Freq: Every day | ORAL | 2 refills | Status: DC

## 2023-01-04 MED ORDER — FUROSEMIDE 40 MG PO TABS
ORAL_TABLET | ORAL | 2 refills | Status: DC
Start: 2023-01-04 — End: 2023-09-22

## 2023-01-04 NOTE — Telephone Encounter (Signed)
*  STAT* If patient is at the pharmacy, call can be transferred to refill team.   1. Which medications need to be refilled? (please list name of each medication and dose if known) furosemide (LASIX) 40 MG tablet  amiodarone (PACERONE) 200 MG tablet   2. Would you like to learn more about the convenience, safety, & potential cost savings by using the [facility]? N/A    3. Are you open to using the [facility] (Type [facility]. N/A   4. Which pharmacy/location (including street and city if local pharmacy) is medication to be sent to? [id number] [place], [place] - [address]    5. Do they need a 30 day or 90 day supply? 90 day supply

## 2023-02-22 NOTE — Progress Notes (Signed)
 HPI: FU syncope, ASD and atrial fibrillation. Patient with history of syncope felt secondary to autonomic dysfunction. Previously loop monitor considered; Dr Carolynne Citron felt it would be indicated if recurrent syncope. H/O pulmonary embolus; seen by pulmonary and chronic anticoagulation felt indicated. Holter monitor November 2018 showed atrial fibrillation with PVCs or aberrantly conducted beats rate controlled. Patient previously placed on amiodarone  and underwent successful cardioversion to sinus rhythm February 2019. Echocardiogram August 2020 showed normal LV function, severe RV dysfunction, severe right ventricular enlargement, moderate left atrial enlargement, severe right atrial enlargement, severe tricuspid regurgitation, mild aortic insufficiency. Follow-up study September 2020 showed moderate to large secundum atrial septal defect with bidirectional shunting. ABIs May 2021 normal on the right and severe on the left. Peripheral vascular disease followed by Dr. Shaunna Delaware. Echocardiogram repeated February 2024 and showed ejection fraction 60 to 65%, grade 1 diastolic dysfunction, D-shaped septum, mild right ventricular enlargement, moderate pulmonary hypertension, moderate biatrial enlargement, atrial septal defect, moderate to severe tricuspid regurgitation.  Since last seen, she has dyspnea with moderate activities but not in the house.  No orthopnea, PND, chest pain, syncope or bleeding.  Chronic mild pedal edema unchanged.  Current Outpatient Medications  Medication Sig Dispense Refill   acetaminophen  (TYLENOL ) 650 MG CR tablet Take 1,300 mg by mouth every 8 (eight) hours as needed for pain.     amiodarone  (PACERONE ) 200 MG tablet Take 1 tablet (200 mg total) by mouth daily. 90 tablet 2   amLODipine  (NORVASC ) 10 MG tablet Take 1 tablet (10 mg total) by mouth daily. 90 tablet 3   apixaban  (ELIQUIS ) 2.5 MG TABS tablet Take 1 tablet (2.5 mg total) by mouth 2 (two) times daily. 180 tablet 1    atorvastatin  (LIPITOR) 20 MG tablet Take 1 tablet (20 mg total) by mouth daily. 90 tablet 3   furosemide  (LASIX ) 40 MG tablet TAKE 1 TABLET BY MOUTH EVERY DAY 90 tablet 2   levothyroxine  (SYNTHROID ) 25 MCG tablet Take 1 tablet (25 mcg total) by mouth daily. 90 tablet 3   No current facility-administered medications for this visit.     Past Medical History:  Diagnosis Date   Arthritis    BREAST BIOPSY, HX OF 06/10/2006   Breast cyst    Dysrhythmia    HIP PAIN, LEFT 01/31/2007   HYPERLIPIDEMIA 06/10/2006   HYPERTENSION 06/10/2006   Impaired glucose tolerance    Lower extremity edema    NEOPLASMS UNSPEC NATURE BONE SOFT TISSUE&SKIN 05/31/2008   OSTEOPOROSIS 06/10/2006   Pulmonary embolus (HCC)     Past Surgical History:  Procedure Laterality Date   ABDOMINAL HYSTERECTOMY     APPENDECTOMY     BREAST SURGERY     bx x2   CARDIOVERSION N/A 03/11/2017   Procedure: CARDIOVERSION;  Surgeon: Luana Rumple, MD;  Location: [facility];  Service: Cardiovascular;  Laterality: N/A;   CHOLECYSTECTOMY  1980   TRANSFORAMINAL LUMBAR INTERBODY FUSION (TLIF) WITH PEDICLE SCREW FIXATION 1 LEVEL Left 10/27/2018   Procedure: Left Lumbar five-sacral one Transforaminal lumbar interbody fusion;  Surgeon: Manya Sells, MD;  Location: [facility];  Service: Neurosurgery;  Laterality: Left;    Social History   Socioeconomic History   Marital status: Married    Spouse name: Not on file   Number of children: 4   Years of education: Not on file   Highest education level: 10th grade  Occupational History   Not on file  Tobacco Use   Smoking status: Former    Types: Cigarettes  Smokeless tobacco: Former    Types: Snuff    Quit date: 08/10/2021  Vaping Use   Vaping status: Never Used  Substance and Sexual Activity   Alcohol use: No    Alcohol/week: 0.0 standard drinks of alcohol   Drug use: No   Sexual activity: Not Currently  Other Topics Concern   Not on file  Social History Narrative   She is  retired    Worked in [employer] for 20 years    Married for 43 years    4 children - live locally.        Social Drivers of Corporate investment banker Strain: Low Risk  (10/23/2022)   Overall Financial Resource Strain (CARDIA)    Difficulty of Paying Living Expenses: Not hard at all  Food Insecurity: No Food Insecurity (10/23/2022)   Hunger Vital Sign    Worried About Running Out of Food in the Last Year: Never true    Ran Out of Food in the Last Year: Never true  Transportation Needs: No Transportation Needs (10/23/2022)   PRAPARE - Administrator, Civil Service (Medical): No    Lack of Transportation (Non-Medical): No  Physical Activity: Inactive (10/23/2022)   Exercise Vital Sign    Days of Exercise per Week: 0 days    Minutes of Exercise per Session: 0 min  Stress: No Stress Concern Present (10/23/2022)   [employer] of Occupational Health - Occupational Stress Questionnaire    Feeling of Stress : Not at all  Social Connections: Socially Integrated (10/23/2022)   Social Connection and Isolation Panel [NHANES]    Frequency of Communication with Friends and Family: More than three times a week    Frequency of Social Gatherings with Friends and Family: More than three times a week    Attends Religious Services: More than 4 times per year    Active Member of [employer] or Organizations: Yes    Attends Engineer, structural: More than 4 times per year    Marital Status: Married  Catering manager Violence: Not At Risk (10/23/2022)   Humiliation, Afraid, Rape, and Kick questionnaire    Fear of Current or Ex-Partner: No    Emotionally Abused: No    Physically Abused: No    Sexually Abused: No    Family History  Problem Relation Age of Onset   Heart disease Mother        Pacemaker   Heart disease Father    Asthma Father    Diabetes Father    Cancer Maternal Aunt        breast   Heart disease Sister    Hyperlipidemia Sister    Hypertension Sister     Lung cancer Sister     ROS: Back pain but no fevers or chills, productive cough, hemoptysis, dysphasia, odynophagia, melena, hematochezia, dysuria, hematuria, rash, seizure activity, orthopnea, PND, claudication. Remaining systems are negative.  Physical Exam: Well-developed well-nourished in no acute distress.  Skin is warm and dry.  HEENT is normal.  Neck is supple.  Chest is clear to auscultation with normal expansion.  Cardiovascular exam is regular rate and rhythm.  Abdominal exam nontender or distended. No masses palpated. Extremities show trace edema. neuro grossly intact  EKG Interpretation Date/Time:  Monday March 08 2023 08:46:51 EST Ventricular Rate:  63 PR Interval:  340 QRS Duration:  144 QT Interval:  476 QTC Calculation: 487 R Axis:   110  Text Interpretation: Sinus rhythm with 1st degree A-V  block Right bundle branch block Septal infarct (cited on or before 18-Aug-2022) When compared with ECG of 18-Aug-2022 09:02, Questionable change in initial forces of Septal leads Confirmed by Alexandria Angel ([id number]) on 03/08/2023 8:49:59 AM    A/P  1 paroxysmal atrial fibrillation-patient remains in sinus rhythm.  Continue amiodarone  and apixaban  at present dose.  Check chest x-ray, hemoglobin, renal function, liver functions and TSH.  2 chronic right-sided CHF-volume status is unchanged compared to previous and reasonable.  Continue diuretic at present dose.  Check potassium and renal function.  3 hypertension-blood pressure controlled.  Continue present medical regimen.  4 history of ASD-given patient's age and comorbidities we have elected to be conservative and patient in agreement with this.  May consider repeating echocardiogram in 6 to 12 months.  5 tricuspid regurgitation-also plan conservative measures given age and comorbidities.  6 hyperlipidemia-continue statin.  7 peripheral vascular disease-continue medical therapy.  8 chronic stage III kidney  disease-check potassium and renal function.  Alexandria Angel, MD

## 2023-03-08 ENCOUNTER — Ambulatory Visit: Payer: Medicare HMO | Attending: Cardiology | Admitting: Cardiology

## 2023-03-08 ENCOUNTER — Encounter: Payer: Self-pay | Admitting: Cardiology

## 2023-03-08 VITALS — BP 110/56 | HR 63 | Ht 67.0 in | Wt 168.0 lb

## 2023-03-08 DIAGNOSIS — I480 Paroxysmal atrial fibrillation: Secondary | ICD-10-CM

## 2023-03-08 DIAGNOSIS — E7800 Pure hypercholesterolemia, unspecified: Secondary | ICD-10-CM | POA: Diagnosis not present

## 2023-03-08 DIAGNOSIS — I10 Essential (primary) hypertension: Secondary | ICD-10-CM | POA: Diagnosis not present

## 2023-03-08 LAB — CBC

## 2023-03-08 MED ORDER — APIXABAN 2.5 MG PO TABS: 2.5000 mg | ORAL_TABLET | Freq: Two times a day (BID) | ORAL | 0 refills | Status: DC

## 2023-03-08 NOTE — Addendum Note (Signed)
 Addended by: Render Carrie on: 03/08/2023 09:16 AM   Modules accepted: Orders

## 2023-03-08 NOTE — Patient Instructions (Signed)
 Medication Instructions:   NO CHANGE *If you need a refill on your cardiac medications before your next appointment, please call your pharmacy*   Lab Work:  Your physician recommends that you HAVE LAB WORK TODAY If you have labs (blood work) drawn today and your tests are completely normal, you will receive your results only by: MyChart Message (if you have MyChart) OR A paper copy in the mail If you have any lab test that is abnormal or we need to change your treatment, we will call you to review the results.   Testing/Procedures: A chest x-ray takes a picture of the organs and structures inside the chest, including the heart, lungs, and blood vessels. This test can show several things, including, whether the heart is enlarges; whether fluid is building up in the lungs; and whether pacemaker / defibrillator leads are still in place. [id number]   Follow-Up: At [facility], you and your health needs are our priority.  As part of our continuing mission to provide you with exceptional heart care, we have created designated Provider Care Teams.  These Care Teams include your primary Cardiologist (physician) and Advanced Practice Providers (APPs -  Physician Assistants and Nurse Practitioners) who all work together to provide you with the care you need, when you need it.    Your next appointment:   6 month(s)  Provider:   Alexandria Angel, MD

## 2023-03-09 ENCOUNTER — Encounter: Payer: Self-pay | Admitting: *Deleted

## 2023-03-09 LAB — TSH: TSH: 4.58 u[IU]/mL — ABNORMAL HIGH (ref 0.450–4.500)

## 2023-03-09 LAB — CBC
Hematocrit: 40.6 % (ref 34.0–46.6)
Hemoglobin: 13.2 g/dL (ref 11.1–15.9)
MCH: 30.8 pg (ref 26.6–33.0)
MCHC: 32.5 g/dL (ref 31.5–35.7)
MCV: 95 fL (ref 79–97)
Platelets: 311 10*3/uL (ref 150–450)
RBC: 4.28 x10E6/uL (ref 3.77–5.28)
RDW: 13.7 % (ref 11.7–15.4)
WBC: 7.7 10*3/uL (ref 3.4–10.8)

## 2023-03-09 LAB — COMPREHENSIVE METABOLIC PANEL
ALT: 16 [IU]/L (ref 0–32)
AST: 24 [IU]/L (ref 0–40)
Albumin: 4.1 g/dL (ref 3.7–4.7)
Alkaline Phosphatase: 93 [IU]/L (ref 44–121)
BUN/Creatinine Ratio: 27 (ref 12–28)
BUN: 48 mg/dL — ABNORMAL HIGH (ref 8–27)
Bilirubin Total: 0.6 mg/dL (ref 0.0–1.2)
CO2: 21 mmol/L (ref 20–29)
Calcium: 9.9 mg/dL (ref 8.7–10.3)
Chloride: 105 mmol/L (ref 96–106)
Creatinine, Ser: 1.78 mg/dL — ABNORMAL HIGH (ref 0.57–1.00)
Globulin, Total: 2.8 g/dL (ref 1.5–4.5)
Glucose: 124 mg/dL — ABNORMAL HIGH (ref 70–99)
Potassium: 4.7 mmol/L (ref 3.5–5.2)
Sodium: 142 mmol/L (ref 134–144)
Total Protein: 6.9 g/dL (ref 6.0–8.5)
eGFR: 27 mL/min/{1.73_m2} — ABNORMAL LOW (ref >59)

## 2023-03-09 LAB — LIPID PANEL
Chol/HDL Ratio: 2.1 {ratio} (ref 0.0–4.4)
Cholesterol, Total: 147 mg/dL (ref 100–199)
HDL: 70 mg/dL (ref >39)
LDL Chol Calc (NIH): 59 mg/dL (ref 0–99)
Triglycerides: 99 mg/dL (ref 0–149)
VLDL Cholesterol Cal: 18 mg/dL (ref 5–40)

## 2023-03-23 ENCOUNTER — Ambulatory Visit
Admission: RE | Admit: 2023-03-23 | Discharge: 2023-03-23 | Disposition: A | Discharge status: Home / Self Care | Payer: Medicare HMO | Source: Ambulatory Visit | Attending: Cardiology | Admitting: Cardiology

## 2023-03-23 DIAGNOSIS — Z79899 Other long term (current) drug therapy: Secondary | ICD-10-CM | POA: Diagnosis not present

## 2023-03-23 DIAGNOSIS — I480 Paroxysmal atrial fibrillation: Secondary | ICD-10-CM

## 2023-05-20 ENCOUNTER — Other Ambulatory Visit: Payer: Self-pay | Admitting: Cardiology

## 2023-05-20 ENCOUNTER — Other Ambulatory Visit: Payer: Self-pay | Admitting: Adult Health

## 2023-05-20 DIAGNOSIS — E039 Hypothyroidism, unspecified: Secondary | ICD-10-CM

## 2023-05-20 DIAGNOSIS — E785 Hyperlipidemia, unspecified: Secondary | ICD-10-CM

## 2023-05-20 DIAGNOSIS — I10 Essential (primary) hypertension: Secondary | ICD-10-CM

## 2023-05-20 NOTE — Telephone Encounter (Signed)
 Prescription refill request for Eliquis  received. Indication: afib  Last office visit: [place] 03/08/2023 Scr: 1.78, 03/08/2023 Age: 89 yo  Weight: 76.2 kg   Refill sent.

## 2023-08-12 ENCOUNTER — Encounter: Payer: Self-pay | Admitting: Adult Health

## 2023-08-12 ENCOUNTER — Ambulatory Visit (INDEPENDENT_AMBULATORY_CARE_PROVIDER_SITE_OTHER): Payer: Medicare HMO | Admitting: Adult Health

## 2023-08-12 ENCOUNTER — Ambulatory Visit: Payer: Self-pay | Admitting: Adult Health

## 2023-08-12 ENCOUNTER — Other Ambulatory Visit: Payer: Self-pay | Admitting: Adult Health

## 2023-08-12 VITALS — BP 138/60 | HR 55 | Temp 97.1°F | Resp 97 | Ht 66.5 in | Wt 170.0 lb

## 2023-08-12 DIAGNOSIS — Z0000 Encounter for general adult medical examination without abnormal findings: Secondary | ICD-10-CM | POA: Diagnosis not present

## 2023-08-12 DIAGNOSIS — E7439 Other disorders of intestinal carbohydrate absorption: Secondary | ICD-10-CM

## 2023-08-12 DIAGNOSIS — I2699 Other pulmonary embolism without acute cor pulmonale: Secondary | ICD-10-CM

## 2023-08-12 DIAGNOSIS — R053 Chronic cough: Secondary | ICD-10-CM

## 2023-08-12 DIAGNOSIS — I10 Essential (primary) hypertension: Secondary | ICD-10-CM

## 2023-08-12 DIAGNOSIS — N184 Chronic kidney disease, stage 4 (severe): Secondary | ICD-10-CM | POA: Diagnosis not present

## 2023-08-12 DIAGNOSIS — I739 Peripheral vascular disease, unspecified: Secondary | ICD-10-CM | POA: Diagnosis not present

## 2023-08-12 DIAGNOSIS — E039 Hypothyroidism, unspecified: Secondary | ICD-10-CM

## 2023-08-12 DIAGNOSIS — I50812 Chronic right heart failure: Secondary | ICD-10-CM

## 2023-08-12 DIAGNOSIS — I480 Paroxysmal atrial fibrillation: Secondary | ICD-10-CM

## 2023-08-12 DIAGNOSIS — E785 Hyperlipidemia, unspecified: Secondary | ICD-10-CM | POA: Diagnosis not present

## 2023-08-12 DIAGNOSIS — M7631 Iliotibial band syndrome, right leg: Secondary | ICD-10-CM

## 2023-08-12 LAB — COMPREHENSIVE METABOLIC PANEL WITH GFR
ALT: 15 U/L (ref 0–35)
AST: 25 U/L (ref 0–37)
Albumin: 4.3 g/dL (ref 3.5–5.2)
Alkaline Phosphatase: 77 U/L (ref 39–117)
BUN: 49 mg/dL — ABNORMAL HIGH (ref 6–23)
CO2: 23 meq/L (ref 19–32)
Calcium: 9.6 mg/dL (ref 8.4–10.5)
Chloride: 104 meq/L (ref 96–112)
Creatinine, Ser: 1.93 mg/dL — ABNORMAL HIGH (ref 0.40–1.20)
GFR: 22.61 mL/min — ABNORMAL LOW (ref >60.00)
Glucose, Bld: 123 mg/dL — ABNORMAL HIGH (ref 70–99)
Potassium: 4.4 meq/L (ref 3.5–5.1)
Sodium: 140 meq/L (ref 135–145)
Total Bilirubin: 0.8 mg/dL (ref 0.2–1.2)
Total Protein: 7.8 g/dL (ref 6.0–8.3)

## 2023-08-12 LAB — LIPID PANEL
Cholesterol: 148 mg/dL (ref 0–200)
HDL: 67.6 mg/dL (ref >39.00)
LDL Cholesterol: 60 mg/dL (ref 0–99)
NonHDL: 79.98
Total CHOL/HDL Ratio: 2
Triglycerides: 102 mg/dL (ref 0.0–149.0)
VLDL: 20.4 mg/dL (ref 0.0–40.0)

## 2023-08-12 LAB — CBC WITH DIFFERENTIAL/PLATELET
Basophils Absolute: 0 K/uL (ref 0.0–0.1)
Basophils Relative: 0.2 % (ref 0.0–3.0)
Eosinophils Absolute: 0.1 K/uL (ref 0.0–0.7)
Eosinophils Relative: 1.4 % (ref 0.0–5.0)
HCT: 40.8 % (ref 36.0–46.0)
Hemoglobin: 13.3 g/dL (ref 12.0–15.0)
Lymphocytes Relative: 37.9 % (ref 12.0–46.0)
Lymphs Abs: 3.1 K/uL (ref 0.7–4.0)
MCHC: 32.7 g/dL (ref 30.0–36.0)
MCV: 92.6 fl (ref 78.0–100.0)
Monocytes Absolute: 0.7 K/uL (ref 0.1–1.0)
Monocytes Relative: 8.4 % (ref 3.0–12.0)
Neutro Abs: 4.3 K/uL (ref 1.4–7.7)
Neutrophils Relative %: 52.1 % (ref 43.0–77.0)
Platelets: 312 K/uL (ref 150.0–400.0)
RBC: 4.4 Mil/uL (ref 3.87–5.11)
RDW: 16.3 % — ABNORMAL HIGH (ref 11.5–15.5)
WBC: 8.2 K/uL (ref 4.0–10.5)

## 2023-08-12 LAB — HEMOGLOBIN A1C: Hgb A1c MFr Bld: 6.5 % (ref 4.6–6.5)

## 2023-08-12 LAB — TSH: TSH: 3.98 u[IU]/mL (ref 0.35–5.50)

## 2023-08-12 MED ORDER — LEVOTHYROXINE SODIUM 25 MCG PO TABS
25.0000 ug | ORAL_TABLET | Freq: Every day | ORAL | 3 refills | Status: AC
Start: 2023-08-12 — End: ?

## 2023-08-12 MED ORDER — PANTOPRAZOLE SODIUM 40 MG PO TBEC: 40.0000 mg | DELAYED_RELEASE_TABLET | Freq: Every day | ORAL | 0 refills | Status: AC

## 2023-08-12 MED ORDER — AMLODIPINE BESYLATE 10 MG PO TABS: 10.0000 mg | ORAL_TABLET | Freq: Every day | ORAL | 3 refills | Status: AC

## 2023-08-12 MED ORDER — METHYLPREDNISOLONE 4 MG PO TBPK: ORAL_TABLET | ORAL | 0 refills | Status: AC

## 2023-08-12 MED ORDER — ATORVASTATIN CALCIUM 20 MG PO TABS
20.0000 mg | ORAL_TABLET | Freq: Every day | ORAL | 3 refills | Status: AC
Start: 2023-08-12 — End: ?

## 2023-08-12 NOTE — Patient Instructions (Signed)
 It was great seeing you today   We will follow up with you regarding your lab work   Please let me know if you need anything

## 2023-08-12 NOTE — Progress Notes (Signed)
 Subjective:    Patient ID: Tammy [name], female    DOB: 05-Mar-1931, 89 y.o.   MRN: [id number]  HPI  Patient presents for yearly preventative medicine examination. She is a pleasant 89 year old female who  has a past medical history of Arthritis, BREAST BIOPSY, HX OF (06/10/2006), Breast cyst, Dysrhythmia, HIP PAIN, LEFT (01/31/2007), HYPERLIPIDEMIA (06/10/2006), HYPERTENSION (06/10/2006), Impaired glucose tolerance, Lower extremity edema, NEOPLASMS UNSPEC NATURE BONE SOFT TISSUE&SKIN (05/31/2008), OSTEOPOROSIS (06/10/2006), and Pulmonary embolus (HCC).  Hyperlipidemia -currently managed with Lipitor 20 mg daily.  Denies myalgia  Lab Results  Component Value Date   CHOL 147 03/08/2023   HDL 70 03/08/2023   LDLCALC 59 03/08/2023   TRIG 99 03/08/2023   CHOLHDL 2.1 03/08/2023   Hypertension -managed with Norvasc  10 mg daily and Lasix  40 mg daily.  She denies dizziness, lightheadedness, or chest pain. BP Readings from Last 3 Encounters:  08/12/23 138/60  03/08/23 (!) 110/56  08/18/22 (!) 158/60   PAF -managed by cardiology.  Currently prescribed amiodarone  200 mg BID and Eliquis  2.5 mg twice daily.  She did undergo a successful cardioversion to sinus rhythm in February 2019. She denies chest pain, syncope orthopnea but has dyspnea with activities but not when she is inside her home.   Chronic right-sided heart failure-is on Lasix  40 mg daily.  Her volume status seems to be reasonable.  PVD-followed by vascular surgery  History of Pulmonary Embolism - on Eliquis    Hypothyroidism -managed with Synthroid  25 mcg every morning.  She did have a thyroid  ultrasound in October 2022 that showed a nodule in the inferior left thyroid  lobe.  Her biopsy results showed scant follicular epithelium present.  CKD IV - takes lasix  40 mg daily.   Glucose Intolerance - not current managed with medication  Lab Results  Component Value Date   HGBA1C 6.3 08/11/2022   HGBA1C 6.4 07/03/2021   HGBA1C 5.9 (A)  01/02/2020   Cough -for quite some time and she has had a dry constant cough.  She denies acid reflux-like symptoms or worsening shortness of breath.  She has tried of various over-the-counter medications without any improvement.  Right leg pain-this is more acute and started about 3 weeks ago.  She denies injury or falls.  Pain radiates from about mid hip down to her knee.  She is unable to describe the pain but reports that it is not a burning pain but that it just hurts.  Pain is less when walking but happens more with changing positions.  Does have a known diagnosis of her lumbar region.  All immunizations and health maintenance protocols were reviewed with the patient and needed orders were placed.  Appropriate screening laboratory values were ordered for the patient including screening of hyperlipidemia, renal function and hepatic function. If indicated by BPH, a PSA was ordered.  Medication reconciliation,  past medical history, social history, problem list and allergies were reviewed in detail with the patient  Goals were established with regard to weight loss, exercise, and  diet in compliance with medications Wt Readings from Last 3 Encounters:  08/12/23 170 lb (77.1 kg)  03/08/23 168 lb (76.2 kg)  10/23/22 167 lb (75.8 kg)    Review of Systems  Constitutional: Negative.   HENT: Negative.    Eyes: Negative.   Respiratory:  Positive for cough and shortness of breath. Negative for wheezing.   Cardiovascular: Negative.   Gastrointestinal: Negative.   Endocrine: Negative.   Genitourinary: Negative.   Musculoskeletal:  Positive for arthralgias, back pain and gait problem.  Skin: Negative.   Allergic/Immunologic: Negative.   Hematological: Negative.   Psychiatric/Behavioral: Negative.     Past Medical History:  Diagnosis Date   Arthritis    BREAST BIOPSY, HX OF 06/10/2006   Breast cyst    Dysrhythmia    HIP PAIN, LEFT 01/31/2007   HYPERLIPIDEMIA 06/10/2006   HYPERTENSION  06/10/2006   Impaired glucose tolerance    Lower extremity edema    NEOPLASMS UNSPEC NATURE BONE SOFT TISSUE&SKIN 05/31/2008   OSTEOPOROSIS 06/10/2006   Pulmonary embolus (HCC)     Social History   Socioeconomic History   Marital status: Married    Spouse name: Not on file   Number of children: 4   Years of education: Not on file   Highest education level: 10th grade  Occupational History   Not on file  Tobacco Use   Smoking status: Former    Types: Cigarettes   Smokeless tobacco: Former    Types: Snuff    Quit date: 08/10/2021  Vaping Use   Vaping status: Never Used  Substance and Sexual Activity   Alcohol use: No    Alcohol/week: 0.0 standard drinks of alcohol   Drug use: No   Sexual activity: Not Currently  Other Topics Concern   Not on file  Social History Narrative   She is retired    Worked in [employer] for 20 years    Married for 43 years    4 children - live locally.        Social Drivers of Corporate investment banker Strain: Low Risk  (10/23/2022)   Overall Financial Resource Strain (CARDIA)    Difficulty of Paying Living Expenses: Not hard at all  Food Insecurity: No Food Insecurity (10/23/2022)   Hunger Vital Sign    Worried About Running Out of Food in the Last Year: Never true    Ran Out of Food in the Last Year: Never true  Transportation Needs: No Transportation Needs (10/23/2022)   PRAPARE - Administrator, Civil Service (Medical): No    Lack of Transportation (Non-Medical): No  Physical Activity: Inactive (10/23/2022)   Exercise Vital Sign    Days of Exercise per Week: 0 days    Minutes of Exercise per Session: 0 min  Stress: No Stress Concern Present (10/23/2022)   [employer] of Occupational Health - Occupational Stress Questionnaire    Feeling of Stress : Not at all  Social Connections: Socially Integrated (10/23/2022)   Social Connection and Isolation Panel    Frequency of Communication with Friends and Family: More than  three times a week    Frequency of Social Gatherings with Friends and Family: More than three times a week    Attends Religious Services: More than 4 times per year    Active Member of [employer] or Organizations: Yes    Attends Banker Meetings: More than 4 times per year    Marital Status: Married  Catering manager Violence: Not At Risk (10/23/2022)   Humiliation, Afraid, Rape, and Kick questionnaire    Fear of Current or Ex-Partner: No    Emotionally Abused: No    Physically Abused: No    Sexually Abused: No    Past Surgical History:  Procedure Laterality Date   ABDOMINAL HYSTERECTOMY     APPENDECTOMY     BREAST SURGERY     bx x2   CARDIOVERSION N/A 03/11/2017   Procedure: CARDIOVERSION;  Surgeon:  Croitoru, Jerel, MD;  Location: [facility];  Service: Cardiovascular;  Laterality: N/A;   CHOLECYSTECTOMY  1980   TRANSFORAMINAL LUMBAR INTERBODY FUSION (TLIF) WITH PEDICLE SCREW FIXATION 1 LEVEL Left 10/27/2018   Procedure: Left Lumbar five-sacral one Transforaminal lumbar interbody fusion;  Surgeon: Unice Pac, MD;  Location: [facility] OR;  Service: Neurosurgery;  Laterality: Left;    Family History  Problem Relation Age of Onset   Heart disease Mother        Pacemaker   Heart disease Father    Asthma Father    Diabetes Father    Cancer Maternal Aunt        breast   Heart disease Sister    Hyperlipidemia Sister    Hypertension Sister    Lung cancer Sister     Allergies  Allergen Reactions   Tuberculin Tests Other (See Comments)    Tuberculosis shot, red spot on arm    Current Outpatient Medications on File Prior to Visit  Medication Sig Dispense Refill   acetaminophen  (TYLENOL ) 650 MG CR tablet Take 1,300 mg by mouth every 8 (eight) hours as needed for pain.     amiodarone  (PACERONE ) 200 MG tablet Take 1 tablet (200 mg total) by mouth daily. 90 tablet 2   amLODipine  (NORVASC ) 10 MG tablet TAKE 1 TABLET BY MOUTH EVERY DAY 90 tablet 0   atorvastatin  (LIPITOR) 20 MG  tablet TAKE 1 TABLET BY MOUTH EVERY DAY 90 tablet 0   ELIQUIS  2.5 MG TABS tablet TAKE 1 TABLET BY MOUTH TWICE A DAY 180 tablet 1   furosemide  (LASIX ) 40 MG tablet TAKE 1 TABLET BY MOUTH EVERY DAY 90 tablet 2   levothyroxine  (SYNTHROID ) 25 MCG tablet TAKE 1 TABLET BY MOUTH EVERY DAY 90 tablet 0   No current facility-administered medications on file prior to visit.    BP 138/60   Pulse (!) 55   Temp (!) 97.1 F (36.2 C) (Oral)   Resp (!) 97   Ht 5' 6.5 (1.689 m)   Wt 170 lb (77.1 kg)   LMP  (LMP Unknown)   BMI 27.03 kg/m       Objective:   Physical Exam Vitals and nursing note reviewed.  Constitutional:      General: She is not in acute distress.    Appearance: Normal appearance. She is not ill-appearing.  HENT:     Head: Normocephalic and atraumatic.     Right Ear: Tympanic membrane, ear canal and external ear normal. There is no impacted cerumen.     Left Ear: Tympanic membrane, ear canal and external ear normal. There is no impacted cerumen.     Nose: Nose normal. No congestion or rhinorrhea.     Mouth/Throat:     Mouth: Mucous membranes are moist.     Pharynx: Oropharynx is clear.  Eyes:     Extraocular Movements: Extraocular movements intact.     Conjunctiva/sclera: Conjunctivae normal.     Pupils: Pupils are equal, round, and reactive to light.  Neck:     Vascular: No carotid bruit.  Cardiovascular:     Rate and Rhythm: Normal rate and regular rhythm.     Pulses: Normal pulses.     Heart sounds: No murmur heard.    No friction rub. No gallop.  Pulmonary:     Effort: Pulmonary effort is normal.     Breath sounds: Normal breath sounds.  Abdominal:     General: Abdomen is flat. Bowel sounds are normal. There is no distension.  Palpations: Abdomen is soft. There is no mass.     Tenderness: There is abdominal tenderness in the epigastric area. There is no guarding or rebound.     Hernia: No hernia is present.  Musculoskeletal:        General: Normal range of  motion.     Cervical back: Normal range of motion and neck supple.     Right upper leg: Bony tenderness present.     Right knee: Bony tenderness present.     Right lower leg: 1+ Pitting Edema present.     Left lower leg: 1+ Pitting Edema present.     Comments: + Nobels test   Lymphadenopathy:     Cervical: No cervical adenopathy.  Skin:    General: Skin is warm and dry.     Capillary Refill: Capillary refill takes less than 2 seconds.  Neurological:     General: No focal deficit present.     Mental Status: She is alert and oriented to person, place, and time.  Psychiatric:        Mood and Affect: Mood normal.        Behavior: Behavior normal.        Thought Content: Thought content normal.        Judgment: Judgment normal.        Assessment & Plan:  1. Routine general medical examination at a health care facility (Primary) Today patient counseled on age appropriate routine health concerns for screening and prevention, each reviewed and up to date or declined. Immunizations reviewed and up to date or declined. Labs ordered and reviewed. Risk factors for depression reviewed and negative. Hearing function and visual acuity are intact. ADLs screened and addressed as needed. Functional ability and level of safety reviewed and appropriate. Education, counseling and referrals performed based on assessed risks today. Patient provided with a copy of personalized plan for preventive services. - Advised to get Tdap and shingles at pharmacy   2. Dyslipidemia - Continue with statin - CBC with Differential/Platelet; Future - Comprehensive metabolic panel with GFR; Future - Lipid panel; Future - TSH; Future - TSH - Lipid panel - Comprehensive metabolic panel with GFR - CBC with Differential/Platelet  3. Essential hypertension - Controlled. No change in medication  - CBC with Differential/Platelet; Future - Comprehensive metabolic panel with GFR; Future - Lipid panel; Future - TSH;  Future - TSH - Lipid panel - Comprehensive metabolic panel with GFR - CBC with Differential/Platelet  4. Paroxysmal atrial fibrillation (HCC) - SR today. Continue with plan from Cardiology  - CBC with Differential/Platelet; Future - Comprehensive metabolic panel with GFR; Future - Lipid panel; Future - TSH; Future - TSH - Lipid panel - Comprehensive metabolic panel with GFR - CBC with Differential/Platelet  5. Chronic right-sided congestive heart failure (HCC) - mild lower extremity edema today. Continue with Lasix   - CBC with Differential/Platelet; Future - Comprehensive metabolic panel with GFR; Future - Lipid panel; Future - TSH; Future - TSH - Lipid panel - Comprehensive metabolic panel with GFR - CBC with Differential/Platelet  6. PVD (peripheral vascular disease) (HCC) - Continue with current therapy - CBC with Differential/Platelet; Future - Comprehensive metabolic panel with GFR; Future - Lipid panel; Future - TSH; Future - TSH - Lipid panel - Comprehensive metabolic panel with GFR - CBC with Differential/Platelet  7. Embolism, pulmonary with infarction (HCC) - Continue with Eliquis   - CBC with Differential/Platelet; Future - Comprehensive metabolic panel with GFR; Future - Lipid panel; Future - TSH;  Future - TSH - Lipid panel - Comprehensive metabolic panel with GFR - CBC with Differential/Platelet  8. Hypothyroidism, unspecified type - Consider dose change of synthroid   - CBC with Differential/Platelet; Future - Comprehensive metabolic panel with GFR; Future - Lipid panel; Future - TSH; Future - TSH - Lipid panel - Comprehensive metabolic panel with GFR - CBC with Differential/Platelet  9. CKD (chronic kidney disease) stage 4, GFR 15-29 ml/min (HCC) - continue to monitor  - CBC with Differential/Platelet; Future - Comprehensive metabolic panel with GFR; Future - Lipid panel; Future - TSH; Future - TSH - Lipid panel - Comprehensive  metabolic panel with GFR - CBC with Differential/Platelet  10. Glucose intolerance - Consider metformin  - CBC with Differential/Platelet; Future - Comprehensive metabolic panel with GFR; Future - Lipid panel; Future - TSH; Future - Hemoglobin A1c; Future - Hemoglobin A1c - TSH - Lipid panel - Comprehensive metabolic panel with GFR - CBC with Differential/Platelet  11. Iliotibial band syndrome of right side - Appears to be IT band syndrome. She does not want to see sports medicine or PT. Will send in medrol  dose pack and advised ice - Follow up if not improving in the next 4-5 days - methylPREDNISolone  (MEDROL  DOSEPAK) 4 MG TBPK tablet; Take as directed  Dispense: 21 tablet; Refill: 0  12. Chronic cough - Possible GERD. Will place her on Protonix  for 30 days to see if this helps - Follow up as needed - pantoprazole  (PROTONIX ) 40 MG tablet; Take 1 tablet (40 mg total) by mouth daily.  Dispense: 30 tablet; Refill: 0  Darleene Shape, NP

## 2023-09-22 ENCOUNTER — Other Ambulatory Visit: Payer: Self-pay | Admitting: Cardiology

## 2023-09-22 DIAGNOSIS — N289 Disorder of kidney and ureter, unspecified: Secondary | ICD-10-CM

## 2023-10-27 ENCOUNTER — Ambulatory Visit: Payer: Medicare HMO

## 2023-10-27 VITALS — BP 120/60 | HR 67 | Temp 97.8°F | Ht 66.5 in | Wt 170.5 lb

## 2023-10-27 DIAGNOSIS — Z0000 Encounter for general adult medical examination without abnormal findings: Secondary | ICD-10-CM

## 2023-10-27 DIAGNOSIS — Z23 Encounter for immunization: Secondary | ICD-10-CM | POA: Diagnosis not present

## 2023-10-27 NOTE — Patient Instructions (Addendum)
 Tammy [name],  Thank you for taking the time for your Medicare Wellness Visit. I appreciate your continued commitment to your health goals. Please review the care plan we discussed, and feel free to reach out if I can assist you further.  Medicare recommends these wellness visits once per year to help you and your care team stay ahead of potential health issues. These visits are designed to focus on prevention, allowing your provider to concentrate on managing your acute and chronic conditions during your regular appointments.  Please note that Annual Wellness Visits do not include a physical exam. Some assessments may be limited, especially if the visit was conducted virtually. If needed, we may recommend a separate in-person follow-up with your provider.  Ongoing Care Seeing your primary care provider every 3 to 6 months helps us  monitor your health and provide consistent, personalized care.    Referrals If a referral was made during today's visit and you haven't received any updates within two weeks, please contact the referred provider directly to check on the status.  Recommended Screenings:  Health Maintenance  Topic Date Due   Zoster (Shingles) Vaccine (1 of 2) Never done   DTaP/Tdap/Td vaccine (2 - Td or Tdap) 06/12/2020   COVID-19 Vaccine (6 - 2024-25 season) 09/27/2023   Medicare Annual Wellness Visit  10/26/2024   Pneumococcal Vaccine for age over 70  Completed   Flu Shot  Completed   HPV Vaccine  Aged Out   Meningitis B Vaccine  Aged Out   Breast Cancer Screening  Discontinued   DEXA scan (bone density measurement)  Discontinued       10/27/2023    9:17 AM  Advanced Directives  Does Patient Have a Medical Advance Directive? No  Would patient like information on creating a medical advance directive? No - Patient declined   Advance Care Planning is important because it: Ensures you receive medical care that aligns with your values, goals, and preferences. Provides  guidance to your family and loved ones, reducing the emotional burden of decision-making during critical moments.  Vision: Annual vision screenings are recommended for early detection of glaucoma, cataracts, and diabetic retinopathy. These exams can also reveal signs of chronic conditions such as diabetes and high blood pressure.  Dental: Annual dental screenings help detect early signs of oral cancer, gum disease, and other conditions linked to overall health, including heart disease and diabetes.  Please see the attached documents for additional preventive care recommendations.

## 2023-10-27 NOTE — Progress Notes (Signed)
 Subjective:   Tammy [name] is a 89 y.o. who presents for a Medicare Wellness preventive visit.  As a reminder, Annual Wellness Visits don't include a physical exam, and some assessments may be limited, especially if this visit is performed virtually. We may recommend an in-person follow-up visit with your provider if needed.  Visit Complete: In person    Persons Participating in Visit: Patient.  AWV Questionnaire: No: Patient Medicare AWV questionnaire was not completed prior to this visit.  Cardiac Risk Factors include: advanced age (>71men, >55 women);hypertension     Objective:    Today's Vitals   10/27/23 0854  BP: 120/60  Pulse: 67  Temp: 97.8 F (36.6 C)  TempSrc: Oral  SpO2: 97%  Weight: 170 lb 8 oz (77.3 kg)  Height: 5' 6.5 (1.689 m)   Body mass index is 27.11 kg/m.     10/27/2023    9:17 AM 10/23/2022    9:29 AM 10/15/2021    3:27 PM 10/02/2020    3:09 PM 11/22/2019    9:57 AM 10/25/2018    9:59 AM 06/07/2018   10:57 AM  Advanced Directives  Does Patient Have a Medical Advance Directive? No No No No No No No  Would patient like information on creating a medical advance directive? No - Patient declined No - Patient declined No - Patient declined No - Patient declined No - Patient declined No - Patient declined No - Patient declined      Data saved with a previous flowsheet row definition    Current Medications (verified) Outpatient Encounter Medications as of 10/27/2023  Medication Sig   acetaminophen  (TYLENOL ) 650 MG CR tablet Take 1,300 mg by mouth every 8 (eight) hours as needed for pain.   amiodarone  (PACERONE ) 200 MG tablet Take 1 tablet (200 mg total) by mouth daily.   amLODipine  (NORVASC ) 10 MG tablet Take 1 tablet (10 mg total) by mouth daily.   atorvastatin  (LIPITOR) 20 MG tablet Take 1 tablet (20 mg total) by mouth daily.   ELIQUIS  2.5 MG TABS tablet TAKE 1 TABLET BY MOUTH TWICE A DAY   furosemide  (LASIX ) 40 MG tablet TAKE 1 TABLET BY MOUTH EVERY  DAY   levothyroxine  (SYNTHROID ) 25 MCG tablet Take 1 tablet (25 mcg total) by mouth daily.   methylPREDNISolone  (MEDROL  DOSEPAK) 4 MG TBPK tablet Take as directed (Patient not taking: Reported on 10/27/2023)   pantoprazole  (PROTONIX ) 40 MG tablet Take 1 tablet (40 mg total) by mouth daily. (Patient not taking: Reported on 10/27/2023)   No facility-administered encounter medications on file as of 10/27/2023.    Allergies (verified) Tuberculin tests   History: Past Medical History:  Diagnosis Date   Arthritis    BREAST BIOPSY, HX OF 06/10/2006   Breast cyst    Dysrhythmia    HIP PAIN, LEFT 01/31/2007   HYPERLIPIDEMIA 06/10/2006   HYPERTENSION 06/10/2006   Impaired glucose tolerance    Lower extremity edema    NEOPLASMS UNSPEC NATURE BONE SOFT TISSUE&SKIN 05/31/2008   OSTEOPOROSIS 06/10/2006   Pulmonary embolus (HCC)    Past Surgical History:  Procedure Laterality Date   ABDOMINAL HYSTERECTOMY     APPENDECTOMY     BREAST SURGERY     bx x2   CARDIOVERSION N/A 03/11/2017   Procedure: CARDIOVERSION;  Surgeon: Francyne Headland, MD;  Location: [facility];  Service: Cardiovascular;  Laterality: N/A;   CHOLECYSTECTOMY  1980   TRANSFORAMINAL LUMBAR INTERBODY FUSION (TLIF) WITH PEDICLE SCREW FIXATION 1 LEVEL Left 10/27/2018  Procedure: Left Lumbar five-sacral one Transforaminal lumbar interbody fusion;  Surgeon: Unice Pac, MD;  Location: [facility] OR;  Service: Neurosurgery;  Laterality: Left;   Family History  Problem Relation Age of Onset   Heart disease Mother        Pacemaker   Heart disease Father    Asthma Father    Diabetes Father    Cancer Maternal Aunt        breast   Heart disease Sister    Hyperlipidemia Sister    Hypertension Sister    Lung cancer Sister    Social History   Socioeconomic History   Marital status: Married    Spouse name: Not on file   Number of children: 4   Years of education: Not on file   Highest education level: 10th grade  Occupational History    Not on file  Tobacco Use   Smoking status: Former    Types: Cigarettes   Smokeless tobacco: Former    Types: Snuff    Quit date: 08/10/2021  Vaping Use   Vaping status: Never Used  Substance and Sexual Activity   Alcohol use: No    Alcohol/week: 0.0 standard drinks of alcohol   Drug use: No   Sexual activity: Not Currently  Other Topics Concern   Not on file  Social History Narrative   She is retired    Worked in [employer] for 20 years    Married for 43 years    4 children - live locally.        Social Drivers of Corporate investment banker Strain: Low Risk  (10/27/2023)   Overall Financial Resource Strain (CARDIA)    Difficulty of Paying Living Expenses: Not hard at all  Food Insecurity: No Food Insecurity (10/27/2023)   Hunger Vital Sign    Worried About Running Out of Food in the Last Year: Never true    Ran Out of Food in the Last Year: Never true  Transportation Needs: No Transportation Needs (10/27/2023)   PRAPARE - Administrator, Civil Service (Medical): No    Lack of Transportation (Non-Medical): No  Physical Activity: Inactive (10/27/2023)   Exercise Vital Sign    Days of Exercise per Week: 0 days    Minutes of Exercise per Session: 0 min  Stress: No Stress Concern Present (10/27/2023)   [employer] of Occupational Health - Occupational Stress Questionnaire    Feeling of Stress: Not at all  Social Connections: Socially Integrated (10/27/2023)   Social Connection and Isolation Panel    Frequency of Communication with Friends and Family: More than three times a week    Frequency of Social Gatherings with Friends and Family: More than three times a week    Attends Religious Services: More than 4 times per year    Active Member of [employer] or Organizations: Yes    Attends Engineer, structural: More than 4 times per year    Marital Status: Married    Tobacco Counseling Counseling given: Not Answered    Clinical Intake:  Pre-visit  preparation completed: Yes  Pain : No/denies pain     BMI - recorded: 27.11 Nutritional Status: BMI 25 -29 Overweight Nutritional Risks: None Diabetes: No  Lab Results  Component Value Date   HGBA1C 6.5 08/12/2023   HGBA1C 6.3 08/11/2022   HGBA1C 6.4 07/03/2021     How often do you need to have someone help you when you read instructions, pamphlets, or other written  materials from your doctor or pharmacy?: 1 - Never  Interpreter Needed?: No  Information entered by :: Rojelio Blush LPN   Activities of Daily Living     10/27/2023    9:14 AM  In your present state of health, do you have any difficulty performing the following activities:  Hearing? 1  Comment Wears Hearing Aids  Vision? 0  Difficulty concentrating or making decisions? 0  Walking or climbing stairs? 1  Comment Uses a Cane  Dressing or bathing? 0  Doing errands, shopping? 0  Preparing Food and eating ? N  Using the Toilet? N  In the past six months, have you accidently leaked urine? N  Do you have problems with loss of bowel control? N  Managing your Medications? N  Managing your Finances? N  Housekeeping or managing your Housekeeping? N    Patient Care Team: Merna Huxley, NP as PCP - General (Family Medicine) Pietro Redell RAMAN, MD as PCP - Cardiology (Cardiology) Liane Sharyne MATSU, [facility] (Inactive) as Pharmacist (Pharmacist)  I have updated your Care Teams any recent Medical Services you may have received from other providers in the past year.     Assessment:   This is a routine wellness examination for [facility].  Hearing/Vision screen Hearing Screening - Comments:: Wears Hearing Aids Vision Screening - Comments:: Wears rx glasses - up to date with routine eye exams with  [facility]   Goals Addressed               This Visit's Progress     Stay active (pt-stated)         Depression Screen     10/27/2023    8:56 AM 10/23/2022    9:28 AM 10/15/2021    3:17 PM 07/03/2021    8:59 AM  10/02/2020    3:08 PM 11/22/2019    9:59 AM 06/29/2019    7:54 AM  PHQ 2/9 Scores  PHQ - 2 Score 0 0 0 0 0 0 0  PHQ- 9 Score    3  0     Fall Risk     10/27/2023    9:16 AM 10/23/2022    9:29 AM 08/10/2022   12:00 PM 10/15/2021    3:27 PM 07/03/2021    8:59 AM  Fall Risk   Falls in the past year? 0 0 0 0 0  Number falls in past yr: 0 0  0 0  Injury with Fall? 0 0  0 0  Risk for fall due to : No Fall Risks No Fall Risks  No Fall Risks No Fall Risks  Follow up Falls evaluation completed Falls prevention discussed  Falls prevention discussed  Falls prevention discussed      Data saved with a previous flowsheet row definition    MEDICARE RISK AT HOME:  Medicare Risk at Home Any stairs in or around the home?: Yes If so, are there any without handrails?: No Home free of loose throw rugs in walkways, pet beds, electrical cords, etc?: Yes Adequate lighting in your home to reduce risk of falls?: Yes Life alert?: No Use of a cane, walker or w/c?: Yes Grab bars in the bathroom?: Yes Shower chair or bench in shower?: No Elevated toilet seat or a handicapped toilet?: Yes  TIMED UP AND GO:  Was the test performed?  Yes  Length of time to ambulate 10 feet: 10 sec Gait slow and steady without use of assistive device  Cognitive Function: 6CIT completed  10/27/2023    9:17 AM 10/23/2022    9:30 AM 10/15/2021    3:27 PM 10/02/2020    3:13 PM 11/22/2019   10:05 AM  6CIT Screen  What Year? 0 points 0 points 0 points 0 points 0 points  What month? 0 points 0 points 0 points 0 points 0 points  What time? 0 points 0 points 0 points 0 points 0 points  Count back from 20 0 points 0 points 0 points 0 points 0 points  Months in reverse 0 points 0 points 0 points 4 points 4 points  Repeat phrase 0 points 0 points 0 points 2 points 8 points  Total Score 0 points 0 points 0 points 6 points 12 points    Immunizations Immunization History  Administered Date(s) Administered   Fluad Quad(high  Dose 65+) 12/06/2018, 11/07/2021   Fluzone Influenza virus vaccine,trivalent (IIV3), split virus 01/02/2004   INFLUENZA, HIGH DOSE SEASONAL PF 12/13/2012, 11/04/2014, 10/28/2017, 11/06/2019, 11/08/2020, 11/12/2022, 10/27/2023   Influenza Split 12/15/2010, 12/14/2011, 10/27/2014   Influenza Whole 10/09/2009   Influenza,inj,Quad PF,6+ Mos 12/18/2013   Influenza-Unspecified 11/26/2015, 10/30/2016   PFIZER(Purple Top)SARS-COV-2 Vaccination 04/22/2019, 05/13/2019, 11/06/2019   Pfizer Covid-19 Vaccine Bivalent Booster 43yrs & up 12/11/2020   Pfizer(Comirnaty)Fall Seasonal Vaccine 12 years and older 11/27/2022   Pneumococcal Conjugate-13 05/31/2013   Pneumococcal Polysaccharide-23 06/13/2010   Tdap 06/13/2010    Screening Tests Health Maintenance  Topic Date Due   Zoster Vaccines- Shingrix (1 of 2) Never done   DTaP/Tdap/Td (2 - Td or Tdap) 06/12/2020   COVID-19 Vaccine (6 - 2024-25 season) 09/27/2023   Medicare Annual Wellness (AWV)  10/26/2024   Pneumococcal Vaccine: 50+ Years  Completed   Influenza Vaccine  Completed   HPV VACCINES  Aged Out   Meningococcal B Vaccine  Aged Out   Mammogram  Discontinued   DEXA SCAN  Discontinued    Health Maintenance Items Addressed: Vaccines Given today: Flu Vaccine  Additional Screening:  Vision Screening: Recommended annual ophthalmology exams for early detection of glaucoma and other disorders of the eye. Is the patient up to date with their annual eye exam?  Yes  Who is the provider or what is the name of the office in which the patient attends annual eye exams? [facility]  Dental Screening: Recommended annual dental exams for proper oral hygiene  Community Resource Referral / Chronic Care Management: CRR required this visit?  No   CCM required this visit?  No   Plan:    I have personally reviewed and noted the following in the patient's chart:   Medical and social history Use of alcohol, tobacco or illicit drugs  Current  medications and supplements including opioid prescriptions. Patient is not currently taking opioid prescriptions. Functional ability and status Nutritional status Physical activity Advanced directives List of other physicians Hospitalizations, surgeries, and ER visits in previous 12 months Vitals Screenings to include cognitive, depression, and falls Referrals and appointments  In addition, I have reviewed and discussed with patient certain preventive protocols, quality metrics, and best practice recommendations. A written personalized care plan for preventive services as well as general preventive health recommendations were provided to patient.   Rojelio LELON Blush, LPN   [id number]/08/[id number]   After Visit Summary: (In Person-Printed) AVS printed and given to the patient  Notes: Nothing significant to report at this time.

## 2023-12-07 DIAGNOSIS — D3131 Benign neoplasm of right choroid: Secondary | ICD-10-CM | POA: Diagnosis not present

## 2023-12-19 ENCOUNTER — Other Ambulatory Visit: Payer: Self-pay | Admitting: Cardiology

## 2023-12-20 ENCOUNTER — Other Ambulatory Visit: Payer: Self-pay

## 2023-12-20 MED ORDER — APIXABAN 2.5 MG PO TABS: 2.5000 mg | ORAL_TABLET | Freq: Two times a day (BID) | ORAL | 0 refills | Status: AC

## 2024-01-17 ENCOUNTER — Other Ambulatory Visit: Payer: Self-pay | Admitting: Cardiology

## 2024-01-17 DIAGNOSIS — N289 Disorder of kidney and ureter, unspecified: Secondary | ICD-10-CM

## 2024-08-16 ENCOUNTER — Encounter: Admitting: Adult Health

## 2024-11-01 ENCOUNTER — Ambulatory Visit
